# Patient Record
Sex: Female | Born: 1975 | Race: Black or African American | Hispanic: No | Marital: Single | Smoking: Current every day smoker
Health system: Southern US, Community
[De-identification: ages and names within clinical notes are randomized; demographics above are authoritative.]

## PROBLEM LIST (undated history)

## (undated) DIAGNOSIS — E119 Type 2 diabetes mellitus without complications: Secondary | ICD-10-CM

## (undated) DIAGNOSIS — N83209 Unspecified ovarian cyst, unspecified side: Secondary | ICD-10-CM

## (undated) DIAGNOSIS — M1990 Unspecified osteoarthritis, unspecified site: Secondary | ICD-10-CM

## (undated) DIAGNOSIS — E785 Hyperlipidemia, unspecified: Secondary | ICD-10-CM

## (undated) DIAGNOSIS — I10 Essential (primary) hypertension: Secondary | ICD-10-CM

## (undated) DIAGNOSIS — O139 Gestational [pregnancy-induced] hypertension without significant proteinuria, unspecified trimester: Secondary | ICD-10-CM

## (undated) HISTORY — PX: HERNIA REPAIR: SHX51

## (undated) HISTORY — DX: Type 2 diabetes mellitus without complications: E11.9

## (undated) HISTORY — DX: Hyperlipidemia, unspecified: E78.5

## (undated) HISTORY — PX: APPENDECTOMY: SHX54

---

## 1997-11-21 ENCOUNTER — Emergency Department (HOSPITAL_COMMUNITY): Admission: EM | Admit: 1997-11-21 | Discharge: 1997-11-21 | Payer: Self-pay | Admitting: Emergency Medicine

## 1997-11-27 ENCOUNTER — Emergency Department (HOSPITAL_COMMUNITY): Admission: EM | Admit: 1997-11-27 | Discharge: 1997-11-27 | Payer: Self-pay | Admitting: Emergency Medicine

## 1998-01-06 ENCOUNTER — Emergency Department (HOSPITAL_COMMUNITY): Admission: EM | Admit: 1998-01-06 | Discharge: 1998-01-06 | Payer: Self-pay | Admitting: Emergency Medicine

## 1998-01-17 ENCOUNTER — Emergency Department (HOSPITAL_COMMUNITY): Admission: EM | Admit: 1998-01-17 | Discharge: 1998-01-17 | Payer: Self-pay | Admitting: *Deleted

## 1998-02-16 ENCOUNTER — Inpatient Hospital Stay (HOSPITAL_COMMUNITY): Admission: AD | Admit: 1998-02-16 | Discharge: 1998-02-16 | Payer: Self-pay | Admitting: Obstetrics

## 1998-04-18 ENCOUNTER — Inpatient Hospital Stay (HOSPITAL_COMMUNITY): Admission: AD | Admit: 1998-04-18 | Discharge: 1998-04-18 | Payer: Self-pay | Admitting: Obstetrics & Gynecology

## 1998-04-27 ENCOUNTER — Other Ambulatory Visit: Admission: RE | Admit: 1998-04-27 | Discharge: 1998-04-27 | Payer: Self-pay | Admitting: Obstetrics

## 1998-05-02 ENCOUNTER — Ambulatory Visit (HOSPITAL_COMMUNITY): Admission: RE | Admit: 1998-05-02 | Discharge: 1998-05-02 | Payer: Self-pay | Admitting: Obstetrics

## 1998-06-17 ENCOUNTER — Inpatient Hospital Stay (HOSPITAL_COMMUNITY): Admission: AD | Admit: 1998-06-17 | Discharge: 1998-06-17 | Payer: Self-pay | Admitting: Obstetrics

## 1998-06-27 ENCOUNTER — Ambulatory Visit (HOSPITAL_COMMUNITY): Admission: RE | Admit: 1998-06-27 | Discharge: 1998-06-27 | Payer: Self-pay | Admitting: Obstetrics

## 1998-07-11 ENCOUNTER — Inpatient Hospital Stay (HOSPITAL_COMMUNITY): Admission: AD | Admit: 1998-07-11 | Discharge: 1998-07-11 | Payer: Self-pay | Admitting: Obstetrics

## 1998-08-02 ENCOUNTER — Emergency Department (HOSPITAL_COMMUNITY): Admission: EM | Admit: 1998-08-02 | Discharge: 1998-08-02 | Payer: Self-pay

## 1998-09-29 ENCOUNTER — Encounter (HOSPITAL_COMMUNITY): Admission: RE | Admit: 1998-09-29 | Discharge: 1998-10-04 | Payer: Self-pay | Admitting: Obstetrics

## 1998-10-03 ENCOUNTER — Inpatient Hospital Stay (HOSPITAL_COMMUNITY): Admission: AD | Admit: 1998-10-03 | Discharge: 1998-10-07 | Payer: Self-pay | Admitting: Obstetrics

## 1999-03-27 ENCOUNTER — Emergency Department (HOSPITAL_COMMUNITY): Admission: EM | Admit: 1999-03-27 | Discharge: 1999-03-27 | Payer: Self-pay | Admitting: Emergency Medicine

## 1999-04-18 ENCOUNTER — Emergency Department (HOSPITAL_COMMUNITY): Admission: EM | Admit: 1999-04-18 | Discharge: 1999-04-18 | Payer: Self-pay | Admitting: *Deleted

## 1999-05-10 ENCOUNTER — Emergency Department (HOSPITAL_COMMUNITY): Admission: EM | Admit: 1999-05-10 | Discharge: 1999-05-10 | Payer: Self-pay | Admitting: Emergency Medicine

## 1999-08-14 ENCOUNTER — Emergency Department (HOSPITAL_COMMUNITY): Admission: EM | Admit: 1999-08-14 | Discharge: 1999-08-14 | Payer: Self-pay | Admitting: *Deleted

## 2000-09-08 ENCOUNTER — Emergency Department (HOSPITAL_COMMUNITY): Admission: EM | Admit: 2000-09-08 | Discharge: 2000-09-08 | Payer: Self-pay | Admitting: Emergency Medicine

## 2000-09-08 ENCOUNTER — Encounter: Payer: Self-pay | Admitting: Emergency Medicine

## 2001-09-25 ENCOUNTER — Encounter: Payer: Self-pay | Admitting: Emergency Medicine

## 2001-09-25 ENCOUNTER — Emergency Department (HOSPITAL_COMMUNITY): Admission: EM | Admit: 2001-09-25 | Discharge: 2001-09-25 | Payer: Self-pay | Admitting: Emergency Medicine

## 2001-10-09 ENCOUNTER — Emergency Department (HOSPITAL_COMMUNITY): Admission: EM | Admit: 2001-10-09 | Discharge: 2001-10-09 | Payer: Self-pay

## 2001-10-15 ENCOUNTER — Emergency Department (HOSPITAL_COMMUNITY): Admission: EM | Admit: 2001-10-15 | Discharge: 2001-10-15 | Payer: Self-pay | Admitting: *Deleted

## 2001-11-12 ENCOUNTER — Emergency Department (HOSPITAL_COMMUNITY): Admission: EM | Admit: 2001-11-12 | Discharge: 2001-11-12 | Payer: Self-pay

## 2002-06-21 ENCOUNTER — Emergency Department (HOSPITAL_COMMUNITY): Admission: EM | Admit: 2002-06-21 | Discharge: 2002-06-21 | Payer: Self-pay | Admitting: Emergency Medicine

## 2002-09-09 ENCOUNTER — Emergency Department (HOSPITAL_COMMUNITY): Admission: EM | Admit: 2002-09-09 | Discharge: 2002-09-09 | Payer: Self-pay | Admitting: Emergency Medicine

## 2002-11-03 ENCOUNTER — Emergency Department (HOSPITAL_COMMUNITY): Admission: EM | Admit: 2002-11-03 | Discharge: 2002-11-03 | Payer: Self-pay | Admitting: Emergency Medicine

## 2002-12-30 ENCOUNTER — Encounter: Payer: Self-pay | Admitting: Emergency Medicine

## 2002-12-30 ENCOUNTER — Emergency Department (HOSPITAL_COMMUNITY): Admission: EM | Admit: 2002-12-30 | Discharge: 2002-12-30 | Payer: Self-pay | Admitting: Emergency Medicine

## 2005-09-16 ENCOUNTER — Emergency Department (HOSPITAL_COMMUNITY): Admission: EM | Admit: 2005-09-16 | Discharge: 2005-09-17 | Payer: Self-pay | Admitting: Emergency Medicine

## 2006-11-21 ENCOUNTER — Emergency Department (HOSPITAL_COMMUNITY): Admission: EM | Admit: 2006-11-21 | Discharge: 2006-11-21 | Payer: Self-pay | Admitting: Emergency Medicine

## 2007-07-14 ENCOUNTER — Inpatient Hospital Stay (HOSPITAL_COMMUNITY): Admission: AD | Admit: 2007-07-14 | Discharge: 2007-07-14 | Payer: Self-pay | Admitting: Obstetrics & Gynecology

## 2007-11-03 ENCOUNTER — Inpatient Hospital Stay (HOSPITAL_COMMUNITY): Admission: AD | Admit: 2007-11-03 | Discharge: 2007-11-03 | Payer: Self-pay | Admitting: Obstetrics

## 2007-11-13 ENCOUNTER — Inpatient Hospital Stay (HOSPITAL_COMMUNITY): Admission: AD | Admit: 2007-11-13 | Discharge: 2007-11-13 | Payer: Self-pay | Admitting: Obstetrics

## 2007-11-18 ENCOUNTER — Inpatient Hospital Stay (HOSPITAL_COMMUNITY): Admission: AD | Admit: 2007-11-18 | Discharge: 2007-11-28 | Payer: Self-pay | Admitting: Obstetrics

## 2007-11-21 ENCOUNTER — Encounter: Payer: Self-pay | Admitting: Obstetrics & Gynecology

## 2009-11-21 ENCOUNTER — Emergency Department (HOSPITAL_COMMUNITY): Admission: EM | Admit: 2009-11-21 | Discharge: 2009-11-21 | Payer: Self-pay | Admitting: Emergency Medicine

## 2009-11-28 ENCOUNTER — Encounter (HOSPITAL_COMMUNITY): Payer: Self-pay | Admitting: Emergency Medicine

## 2009-11-28 ENCOUNTER — Emergency Department (HOSPITAL_COMMUNITY): Admission: EM | Admit: 2009-11-28 | Discharge: 2009-11-28 | Payer: Self-pay | Admitting: Emergency Medicine

## 2009-11-28 ENCOUNTER — Ambulatory Visit: Payer: Self-pay | Admitting: Vascular Surgery

## 2009-12-21 ENCOUNTER — Emergency Department (HOSPITAL_COMMUNITY): Admission: EM | Admit: 2009-12-21 | Discharge: 2009-12-21 | Payer: Self-pay | Admitting: Emergency Medicine

## 2010-02-09 ENCOUNTER — Emergency Department (HOSPITAL_COMMUNITY): Admission: EM | Admit: 2010-02-09 | Discharge: 2010-02-09 | Payer: Self-pay | Admitting: Emergency Medicine

## 2010-06-03 ENCOUNTER — Emergency Department (HOSPITAL_COMMUNITY)
Admission: EM | Admit: 2010-06-03 | Discharge: 2010-06-03 | Payer: Self-pay | Source: Home / Self Care | Admitting: Emergency Medicine

## 2010-06-04 ENCOUNTER — Emergency Department (HOSPITAL_COMMUNITY): Admission: EM | Admit: 2010-06-04 | Discharge: 2010-06-04 | Payer: Self-pay | Admitting: Emergency Medicine

## 2010-07-27 ENCOUNTER — Emergency Department (HOSPITAL_COMMUNITY)
Admission: EM | Admit: 2010-07-27 | Discharge: 2010-07-27 | Payer: Self-pay | Source: Home / Self Care | Admitting: Emergency Medicine

## 2010-09-09 ENCOUNTER — Emergency Department (HOSPITAL_COMMUNITY)
Admission: EM | Admit: 2010-09-09 | Discharge: 2010-09-09 | Disposition: A | Discharge status: Home / Self Care | Payer: Medicaid Other | Attending: Emergency Medicine | Admitting: Emergency Medicine

## 2010-09-09 DIAGNOSIS — I10 Essential (primary) hypertension: Secondary | ICD-10-CM | POA: Insufficient documentation

## 2010-09-09 DIAGNOSIS — R1013 Epigastric pain: Secondary | ICD-10-CM | POA: Insufficient documentation

## 2010-09-09 DIAGNOSIS — K5289 Other specified noninfective gastroenteritis and colitis: Secondary | ICD-10-CM | POA: Insufficient documentation

## 2010-09-09 DIAGNOSIS — R000 Tachycardia, unspecified: Secondary | ICD-10-CM | POA: Insufficient documentation

## 2010-09-09 DIAGNOSIS — R112 Nausea with vomiting, unspecified: Secondary | ICD-10-CM | POA: Insufficient documentation

## 2010-09-09 DIAGNOSIS — R509 Fever, unspecified: Secondary | ICD-10-CM | POA: Insufficient documentation

## 2010-09-09 LAB — POCT PREGNANCY, URINE: Preg Test, Ur: NEGATIVE

## 2010-09-09 LAB — URINALYSIS, ROUTINE W REFLEX MICROSCOPIC
Ketones, ur: NEGATIVE mg/dL
Nitrite: NEGATIVE
Protein, ur: NEGATIVE mg/dL
Urobilinogen, UA: 1 mg/dL (ref 0.0–1.0)

## 2010-10-16 LAB — URINALYSIS, ROUTINE W REFLEX MICROSCOPIC
Bilirubin Urine: NEGATIVE
Ketones, ur: NEGATIVE mg/dL
Nitrite: NEGATIVE
Protein, ur: NEGATIVE mg/dL
pH: 6 (ref 5.0–8.0)

## 2010-11-05 ENCOUNTER — Emergency Department (HOSPITAL_COMMUNITY)
Admission: EM | Admit: 2010-11-05 | Discharge: 2010-11-05 | Disposition: A | Discharge status: Home / Self Care | Payer: Medicaid Other | Attending: Emergency Medicine | Admitting: Emergency Medicine

## 2010-11-05 DIAGNOSIS — R51 Headache: Secondary | ICD-10-CM | POA: Insufficient documentation

## 2010-11-05 DIAGNOSIS — R112 Nausea with vomiting, unspecified: Secondary | ICD-10-CM | POA: Insufficient documentation

## 2010-11-05 DIAGNOSIS — H53149 Visual discomfort, unspecified: Secondary | ICD-10-CM | POA: Insufficient documentation

## 2010-12-12 NOTE — Discharge Summary (Signed)
[name], [name]               ACCOUNT NO.:  [id number]   MEDICAL RECORD NO.:  [id number]          PATIENT TYPE:  INP   LOCATION:  9303                          FACILITY:  [facility]   PHYSICIAN:  Nicole [name], M.D.DATE OF BIRTH:  07-13-1976   DATE OF ADMISSION:  11/18/2007  DATE OF DISCHARGE:  11/28/2007                               DISCHARGE SUMMARY   The patient is a 35 year old gravida 1-0-0-1, had a C- section in the  past at term for preeclampsia and was seen today with a blood pressure  of 170/103 went down to 163/80, pulse 72, O2 sats 99%, platelets 126,  SGOT 56, SGPT 24, LDL 216, and uric acid 8.1.  She had a complaint of  epigastric pain and from the umbilicus going up.  She was 24 weeks'  pregnant, and she has had this problem along with nausea, vomiting for 2  days when she was at home trying to hold on.  Cervix long, closed.  The  pain was intermittent other than contracting.  She was pre-eclamptic and  started on magnesium 4 g loading 2 g per hour.  Ultrasound showed a  normal-appearing labor.  The aorta was normal size.  She had a Foley  catheter inserted and her urine output was greater than 200 mL/hour.  A  24-hour urine collection was begun and total protein was 21.34.  After  admission, she was given betamethasone 12.5 IM to be repeated in 24  hours, if the patient remained stable.  Her 24-hour urine protein was  21.34 mg for the day, creatinine 81.6.  Creatinine clearance 123.  The  liver enzymes were normal.  During the course of the night, she got  hydralazine x2 and her epigastric pain resolved.  She does a consult  from perinatology, and her labs were continued to be drawn q.8 h.  By  November 20, 2007, highest blood pressure was 160/85, output greater than  200 mL/hour, platelets on November 20, 2007, 140 up from 123, sodium 136,  potassium 4.3.  Uric acid 8.1, creatinine 0.88, SGOT 37, SGPT 35, all  normal, and she had 2+ edema.  By 5:30 a.m., on November 21, 2007, she  complained of epigastric pain, nausea, vomiting.  The blood pressures  were  in the 160s over 90s.  Fetal heart normal and her liver enzymes  tripled, platelets fell, and she a had HELLP syndrome and it was decided  she be delivered.  She underwent a low cesarean section, and she had a 1-  pound 1-ounce female.  Postop day 1, she was stable and her magnesium was  discontinued on day 2.  By day 3 postop, blood pressure 150/90,  hemoglobin 9.4, and platelets 115.  SGPT 173,  LDL 345, magnesium have  been stopped and she was on Norvasc 10 mg daily and clonidine 0.1 b.i.d.  On day 4,  the clonidine was discontinued and started on labetalol 200  p.o. q.8 h., hydrochlorothiazide 50 daily, Norvasc 10 mg daily.  By day  5, her blood pressures were all normal and temperature was 102.  She was  started on ampicillin and gentamicin and blood pressures, leg 120/70.  On day 5, the hydrochlorothiazide was discontinued and labetalol  decreased to b.i.d.  Her edema became less.  On day 6, highest  temperature was 103 at 6 p.m. On day 5, no complaints.  She had Cleocin  900 IV added to her regimen, and by the evening of day 6, she was  afebrile for greater than 24 hours, felt fine, and was discharged home  on Cleocin 300 p.o. q.6 h.,  labetalol 200 mg p.o. b.i.d., Norvasc 10 mg  p.o. daily, Tylox for pain and to see me in 1 week for blood pressure  check.   DISCHARGE DIAGNOSIS:  Severe preeclampsia with hemolysis, elevated liver  enzymes, and low platelet syndrome and postpartum endometritis.           ______________________________  Nicole [name], M.D.     Nicole [name]  D:  11/27/2007  T:  11/28/2007  Job:  [id number]

## 2010-12-12 NOTE — Op Note (Signed)
Nicole [name], Nicole [name]               ACCOUNT NO.:  [id number]   MEDICAL RECORD NO.:  [id number]          PATIENT TYPE:  INP   LOCATION:  9158                          FACILITY:  [facility]   PHYSICIAN:  Roseanna Rainbow, M.D.DATE OF BIRTH:  1976-01-28   DATE OF PROCEDURE:  11/21/2007  DATE OF DISCHARGE:                               OPERATIVE REPORT   PREOPERATIVE DIAGNOSES:  1. Intrauterine pregnancy at 25+ weeks.  2. Partial hemolysis, elevated liver enzymes, low platelets syndrome.  3. Persistent epigastric pain.   POSTOPERATIVE DIAGNOSES:  1. Intrauterine pregnancy at 25+ weeks.  2. Partial hemolysis, elevated liver enzymes, low platelets syndrome.  3. Persistent epigastric pain.   PROCEDURE:  Repeat classical cesarean delivery.   SURGEON:  Dr. Tamela Oddi.   ANESTHESIA:  Spinal.   PATHOLOGY:  Placenta.   ESTIMATED BLOOD LOSS:  600 mL.   COMPLICATIONS:  None.   DESCRIPTION OF PROCEDURE:  The patient was taken to the operating room  with an IV running.  A spinal anesthetic was then placed.  The patient  was placed in the dorsal supine position with a left lateral tilt and  prepped and draped in the usual sterile fashion.  After a time-out had  been completed, a Pfannenstiel skin incision was then made with the  scalpel through the previous scar and carried down to the underlying  fascia.  The fascia was nicked in the midline.  The fascial incision was  then extended bilaterally with curved Mayo scissors.  The superior  aspect of the fascial incision was tented up and the underlying rectus  muscles dissected off.  The inferior aspect of the fascial incision was  manipulated in a similar fashion.  The rectus muscles were scarred in  the midline and the scarring involved the underlying parietal peritoneum  of the anterior abdominal wall.  The rectus muscles were then separated  in the midline using sharp and blunt dissection.  The parietal  peritoneum was tented up and  entered sharply.  This incision was  extended superiorly and inferiorly with good visualization of the  bladder.  An Alexis retractor was then placed into the incision.  The  vesicouterine peritoneum was tented up and entered sharply.  This  incision was then extended bilaterally and a bladder flap created  bluntly.  A midline vertical uterine incision was then made with the  scalpel starting in the lower uterine segment and extending to the  fundal region.  The infant and sac and placenta were delivered in caul.  The cord was clamped and cut.  The infant was handed off to the awaiting  neonatologist.  An umbilical artery pH was sent.  The uterine incision  was then reapproximated in layers using running interlocking sutures of  0 Monocryl.  The paracolic gutters were then irrigated.  Seprafilm was  placed over the uterine incision sutures.  The parietal peritoneum was  reapproximated in a running fashion using 2-0 Vicryl.  This fascia was  closed  in a running fashion using 0 PDS.  The skin was closed with staples.  At  the close of  the procedure, the instrument and pack counts were said to  be correct x2.  The patient had been given 2 gm of cephazolin.  The  patient was taken to the PACU, awake and in stable condition.      Roseanna Rainbow, M.D.  Electronically Signed     LAJ/MEDQ  D:  11/21/2007  T:  11/21/2007  Job:  [id number]   cc:   Kathreen Cosier, M.D.  Fax: [phone]

## 2011-02-20 ENCOUNTER — Emergency Department (HOSPITAL_COMMUNITY)
Admission: EM | Admit: 2011-02-20 | Discharge: 2011-02-20 | Disposition: A | Discharge status: Home / Self Care | Payer: Medicaid Other | Attending: Emergency Medicine | Admitting: Emergency Medicine

## 2011-02-20 DIAGNOSIS — S0990XA Unspecified injury of head, initial encounter: Secondary | ICD-10-CM | POA: Insufficient documentation

## 2011-02-20 DIAGNOSIS — R51 Headache: Secondary | ICD-10-CM | POA: Insufficient documentation

## 2011-02-20 DIAGNOSIS — I10 Essential (primary) hypertension: Secondary | ICD-10-CM | POA: Insufficient documentation

## 2011-02-20 DIAGNOSIS — IMO0002 Reserved for concepts with insufficient information to code with codable children: Secondary | ICD-10-CM | POA: Insufficient documentation

## 2011-04-24 LAB — COMPREHENSIVE METABOLIC PANEL
ALT: 102 — ABNORMAL HIGH
ALT: 278 — ABNORMAL HIGH
ALT: 306 — ABNORMAL HIGH
ALT: 39 — ABNORMAL HIGH
ALT: 40 — ABNORMAL HIGH
ALT: 44 — ABNORMAL HIGH
ALT: 446 — ABNORMAL HIGH
ALT: 600 — ABNORMAL HIGH
AST: 125 — ABNORMAL HIGH
AST: 37
AST: 42 — ABNORMAL HIGH
AST: 458 — ABNORMAL HIGH
AST: 56 — ABNORMAL HIGH
AST: 63 — ABNORMAL HIGH
AST: 631 — ABNORMAL HIGH
AST: 88 — ABNORMAL HIGH
Albumin: 2.2 — ABNORMAL LOW
Albumin: 2.2 — ABNORMAL LOW
Albumin: 2.3 — ABNORMAL LOW
Albumin: 2.4 — ABNORMAL LOW
Albumin: 2.5 — ABNORMAL LOW
Albumin: 2.5 — ABNORMAL LOW
Albumin: 2.6 — ABNORMAL LOW
Albumin: 2.7 — ABNORMAL LOW
Albumin: 2.7 — ABNORMAL LOW
Alkaline Phosphatase: 60
Alkaline Phosphatase: 60
Alkaline Phosphatase: 62
Alkaline Phosphatase: 62
Alkaline Phosphatase: 63
Alkaline Phosphatase: 79
Alkaline Phosphatase: 80
BUN: 10
BUN: 12
BUN: 13
BUN: 6
BUN: 6
BUN: 6
BUN: 7
CO2: 22
CO2: 24
CO2: 24
CO2: 25
CO2: 25
CO2: 25
CO2: 26
CO2: 27
CO2: 28
CO2: 30
Calcium: 7 — ABNORMAL LOW
Calcium: 7.2 — ABNORMAL LOW
Calcium: 7.2 — ABNORMAL LOW
Calcium: 7.4 — ABNORMAL LOW
Calcium: 8.1 — ABNORMAL LOW
Calcium: 8.7
Chloride: 101
Chloride: 102
Chloride: 103
Chloride: 103
Chloride: 103
Chloride: 103
Chloride: 104
Chloride: 105
Chloride: 105
Creatinine, Ser: 0.8
Creatinine, Ser: 0.81
Creatinine, Ser: 0.87
Creatinine, Ser: 0.88
Creatinine, Ser: 0.97
Creatinine, Ser: 1.02
GFR calc Af Amer: >60
GFR calc Af Amer: >60
GFR calc Af Amer: >60
GFR calc Af Amer: >60
GFR calc Af Amer: >60
GFR calc Af Amer: >60
GFR calc non Af Amer: >60
GFR calc non Af Amer: >60
GFR calc non Af Amer: >60
GFR calc non Af Amer: >60
GFR calc non Af Amer: >60
GFR calc non Af Amer: >60
GFR calc non Af Amer: >60
GFR calc non Af Amer: >60
GFR calc non Af Amer: >60
GFR calc non Af Amer: >60
GFR calc non Af Amer: >60
Glucose, Bld: 104 — ABNORMAL HIGH
Glucose, Bld: 114 — ABNORMAL HIGH
Glucose, Bld: 134 — ABNORMAL HIGH
Glucose, Bld: 139 — ABNORMAL HIGH
Glucose, Bld: 96
Potassium: 3.9
Potassium: 4
Potassium: 4.1
Potassium: 4.1
Potassium: 4.2
Potassium: 4.3
Potassium: 4.4
Potassium: 4.5
Sodium: 132 — ABNORMAL LOW
Sodium: 133 — ABNORMAL LOW
Sodium: 133 — ABNORMAL LOW
Sodium: 134 — ABNORMAL LOW
Sodium: 134 — ABNORMAL LOW
Sodium: 134 — ABNORMAL LOW
Sodium: 136
Sodium: 137
Total Bilirubin: 0.3
Total Bilirubin: 0.4
Total Bilirubin: 0.5
Total Bilirubin: 0.6
Total Bilirubin: 0.8
Total Bilirubin: 0.9
Total Bilirubin: 1
Total Bilirubin: 1.6 — ABNORMAL HIGH
Total Protein: 5.1 — ABNORMAL LOW
Total Protein: 5.5 — ABNORMAL LOW
Total Protein: 5.5 — ABNORMAL LOW
Total Protein: 5.7 — ABNORMAL LOW
Total Protein: 5.8 — ABNORMAL LOW
Total Protein: 6.2

## 2011-04-24 LAB — CBC
HCT: 27.7 — ABNORMAL LOW
HCT: 33.6 — ABNORMAL LOW
HCT: 35.4 — ABNORMAL LOW
HCT: 36.6
HCT: 37.5
HCT: 39.8
Hemoglobin: 10.1 — ABNORMAL LOW
Hemoglobin: 10.7 — ABNORMAL LOW
Hemoglobin: 11.5 — ABNORMAL LOW
Hemoglobin: 12
Hemoglobin: 12.7
Hemoglobin: 12.8
MCHC: 33.6
MCHC: 34
MCHC: 34.1
MCHC: 34.3
MCHC: 34.4
MCHC: 34.5
MCHC: 34.6
MCV: 83.4
MCV: 83.5
MCV: 83.7
MCV: 83.7
MCV: 84.4
MCV: 84.7
MCV: 84.7
MCV: 84.8
Platelets: 115 — ABNORMAL LOW
Platelets: 126 — ABNORMAL LOW
Platelets: 135 — ABNORMAL LOW
Platelets: 140 — ABNORMAL LOW
Platelets: 166
Platelets: 169
Platelets: 46 — CL
Platelets: 53 — ABNORMAL LOW
Platelets: 97 — ABNORMAL LOW
RBC: 3.46 — ABNORMAL LOW
RBC: 3.68 — ABNORMAL LOW
RBC: 3.98
RBC: 4.14
RBC: 4.38
RBC: 4.43
RBC: 4.48
RDW: 14.7
RDW: 15
RDW: 15
RDW: 15.1
RDW: 15.3
WBC: 11.2 — ABNORMAL HIGH
WBC: 11.3 — ABNORMAL HIGH
WBC: 13.8 — ABNORMAL HIGH
WBC: 14.5 — ABNORMAL HIGH
WBC: 15 — ABNORMAL HIGH
WBC: 16.8 — ABNORMAL HIGH
WBC: 23 — ABNORMAL HIGH

## 2011-04-24 LAB — CULTURE, BLOOD (ROUTINE X 2)
Culture: NO GROWTH
Culture: NO GROWTH

## 2011-04-24 LAB — DIC (DISSEMINATED INTRAVASCULAR COAGULATION)PANEL
D-Dimer, Quant: 3.18 — ABNORMAL HIGH
Fibrinogen: 415
INR: 0.9
Platelets: 106 — ABNORMAL LOW
Prothrombin Time: 12.4

## 2011-04-24 LAB — LACTATE DEHYDROGENASE
LDH: 145
LDH: 190
LDH: 199
LDH: 209
LDH: 211
LDH: 611 — ABNORMAL HIGH

## 2011-04-24 LAB — URINALYSIS, ROUTINE W REFLEX MICROSCOPIC
Bilirubin Urine: NEGATIVE
Ketones, ur: 40 — AB
Nitrite: NEGATIVE
Specific Gravity, Urine: 1.025
Specific Gravity, Urine: 1.025
Urobilinogen, UA: 0.2
pH: 6

## 2011-04-24 LAB — URIC ACID
Uric Acid, Serum: 7.5 — ABNORMAL HIGH
Uric Acid, Serum: 8.3 — ABNORMAL HIGH
Uric Acid, Serum: 8.4 — ABNORMAL HIGH
Uric Acid, Serum: 8.7 — ABNORMAL HIGH
Uric Acid, Serum: 9.1 — ABNORMAL HIGH

## 2011-04-24 LAB — CREATININE CLEARANCE, URINE, 24 HOUR
Creatinine, 24H Ur: 1795
Creatinine: 1.01

## 2011-04-24 LAB — MAGNESIUM
Magnesium: 3.1 — ABNORMAL HIGH
Magnesium: 5.9 — ABNORMAL HIGH

## 2011-04-24 LAB — DIFFERENTIAL
Basophils Relative: 0
Eosinophils Absolute: 0.1
Eosinophils Relative: 0
Lymphocytes Relative: 8 — ABNORMAL LOW
Lymphs Abs: 1.1
Neutro Abs: 12.4 — ABNORMAL HIGH
Neutrophils Relative %: 75

## 2011-04-24 LAB — HEPATITIS B SURFACE ANTIGEN: Hepatitis B Surface Ag: NEGATIVE

## 2011-04-24 LAB — SICKLE CELL SCREEN: Sickle Cell Screen: NEGATIVE

## 2011-04-24 LAB — URINE MICROSCOPIC-ADD ON

## 2011-04-24 LAB — TYPE AND SCREEN: ABO/RH(D): O POS

## 2011-04-24 LAB — PROTEIN, URINE, 24 HOUR: Collection Interval-UPROT: 24

## 2011-04-24 LAB — RPR: RPR Ser Ql: NONREACTIVE

## 2011-04-24 LAB — ABO/RH: ABO/RH(D): O POS

## 2011-04-24 LAB — RAPID HIV SCREEN (WH-MAU): Rapid HIV Screen: NONREACTIVE

## 2011-04-24 LAB — AMYLASE: Amylase: 43

## 2011-04-24 LAB — SAMPLE TO BLOOD BANK

## 2011-05-04 LAB — CBC
HCT: 38.1
MCHC: 33.6
Platelets: 221
RDW: 13.8

## 2011-05-04 LAB — URINALYSIS, ROUTINE W REFLEX MICROSCOPIC
Bilirubin Urine: NEGATIVE
Glucose, UA: NEGATIVE
Ketones, ur: NEGATIVE
Specific Gravity, Urine: 1.025
pH: 5.5

## 2011-05-04 LAB — URINE MICROSCOPIC-ADD ON

## 2011-05-04 LAB — GC/CHLAMYDIA PROBE AMP, GENITAL: Chlamydia, DNA Probe: NEGATIVE

## 2011-05-04 LAB — POCT PREGNANCY, URINE: Preg Test, Ur: POSITIVE

## 2011-05-04 LAB — WET PREP, GENITAL: Trich, Wet Prep: NONE SEEN

## 2011-07-18 ENCOUNTER — Encounter: Payer: Self-pay | Admitting: *Deleted

## 2011-07-18 ENCOUNTER — Emergency Department (HOSPITAL_BASED_OUTPATIENT_CLINIC_OR_DEPARTMENT_OTHER)
Admission: EM | Admit: 2011-07-18 | Discharge: 2011-07-18 | Disposition: A | Discharge status: Home / Self Care | Payer: Self-pay | Attending: Emergency Medicine | Admitting: Emergency Medicine

## 2011-07-18 DIAGNOSIS — J209 Acute bronchitis, unspecified: Secondary | ICD-10-CM | POA: Insufficient documentation

## 2011-07-18 DIAGNOSIS — F17200 Nicotine dependence, unspecified, uncomplicated: Secondary | ICD-10-CM | POA: Insufficient documentation

## 2011-07-18 DIAGNOSIS — I10 Essential (primary) hypertension: Secondary | ICD-10-CM | POA: Insufficient documentation

## 2011-07-18 HISTORY — DX: Essential (primary) hypertension: I10

## 2011-07-18 MED ORDER — MORPHINE SULFATE 10 MG/ML IJ SOLN: 10.0000 mg | Freq: Once | INTRAMUSCULAR | Status: DC

## 2011-07-18 MED ORDER — ACETAMINOPHEN 325 MG PO TABS
650.0000 mg | ORAL_TABLET | Freq: Once | ORAL | Status: AC
  Administered 2011-07-18: 650 mg via ORAL
  Filled 2011-07-18 (×2): qty 2

## 2011-07-18 MED ORDER — DOXYCYCLINE HYCLATE 100 MG PO CAPS: 100.0000 mg | ORAL_CAPSULE | Freq: Two times a day (BID) | ORAL | Status: AC

## 2011-07-18 MED ORDER — ONDANSETRON HCL 4 MG/2ML IJ SOLN: 4.0000 mg | Freq: Once | INTRAMUSCULAR | Status: DC

## 2011-07-18 MED ORDER — HYDROCOD POLST-CHLORPHEN POLST 10-8 MG/5ML PO LQCR: 10.0000 mL | Freq: Two times a day (BID) | ORAL | Status: DC | PRN

## 2011-07-18 MED ORDER — AZITHROMYCIN 250 MG PO TABS: ORAL_TABLET | ORAL | Status: DC

## 2011-07-18 NOTE — ED Notes (Signed)
Pt brought in by EMS for flu like symptoms x 1 week

## 2011-07-18 NOTE — ED Provider Notes (Signed)
History     CSN: [id number] Arrival date & time: 07/18/2011  2:28 PM   First MD Initiated Contact with Patient 07/18/11 1452      Chief Complaint  Patient presents with  . Influenza    (Consider location/radiation/quality/duration/timing/severity/associated sxs/prior treatment) HPI Comments: The patient is a 35 her old woman who got sick last Sunday, 3 days ago. She's had earache and sore throat. She is coughing productively. She has posttussive emesis. She feels weak. She does not she's had a fever or not because she has measured her temperature. She took some Tamiflu that a friend had without relief. She smokes, approximately 6 cigarettes per day, but has not smoked with this illness.  Patient is a 35 y.o. female presenting with URI. The history is provided by the patient and medical records. No language interpreter was used.  URI The primary symptoms include fever, headaches, ear pain, sore throat, cough, nausea, vomiting and myalgias. The current episode started 3 to 5 days ago. The problem has been gradually worsening.  Episode onset: She has had a subjective sensation of fever but did not measure her temperature.  The headache began more than 2 days ago. The headache developed gradually. The pain from the headache is at a severity of 10/10.  The cough began 3 to 5 days ago. The cough is paroxysmal. There is nondescript sputum produced.  The vomiting began more than 2 days ago. Frequency: She has post-tussive emesis.  Risk factors: Her children have been sick with upper respiratory infections.    Past Medical History  Diagnosis Date  . Hypertension     Past Surgical History  Procedure Date  . Cesarean section     History reviewed. No pertinent family history.  History  Substance Use Topics  . Smoking status: Current Everyday Smoker -- 0.5 packs/day  . Smokeless tobacco: Not on file  . Alcohol Use: No    OB History    Grav Para Term Preterm Abortions TAB SAB Ect  Mult Living                  Review of Systems  Constitutional: Positive for fever.  HENT: Positive for ear pain and sore throat.   Eyes: Negative.   Respiratory: Positive for cough.   Gastrointestinal: Positive for nausea and vomiting. Negative for diarrhea.  Genitourinary: Negative.   Musculoskeletal: Positive for myalgias.  Neurological: Positive for headaches.  Psychiatric/Behavioral: Negative.     Allergies  Review of patient's allergies indicates no known allergies.  Home Medications  No current outpatient prescriptions on file.  BP 135/73  Pulse 63  Temp(Src) 99.4 F (37.4 C) (Oral)  Resp 16  Ht 5\' 8"  (1.727 m)  Wt 275 lb (124.739 kg)  BMI 41.81 kg/m2  SpO2 100%  LMP 06/30/2011  Physical Exam  Nursing note and vitals reviewed. Constitutional: She is oriented to person, place, and time. She appears well-developed and well-nourished. Distressed: in mild to moderate distress, complaining of headache, earache sore throat and cough, and posttussive emesis.  HENT:  Head: Normocephalic and atraumatic.  Right Ear: External ear normal.  Left Ear: External ear normal.  Mouth/Throat: Oropharynx is clear and moist.  Eyes: Conjunctivae and EOM are normal. Pupils are equal, round, and reactive to light. Left eye exhibits no discharge. No scleral icterus.  Neck: Normal range of motion. Neck supple.  Cardiovascular: Normal rate and regular rhythm.   Pulmonary/Chest: Effort normal and breath sounds normal.  Abdominal: Soft. Bowel sounds are normal.  Musculoskeletal: Normal range of motion.  Lymphadenopathy:    She has no cervical adenopathy.  Neurological: She is alert and oriented to person, place, and time.       No sensory or motor deficit.  There is no bony deformity of her head or neck. Her neck is supple with full range of motion.  Skin: Skin is warm and dry.  Psychiatric: She has a normal mood and affect. Her behavior is normal.    ED Course  Procedures  (including critical care time)   1. Acute bronchitis      Disp:  Rx with Z-Pak, Tussionex.  Instructions for bronchitis, hazards of smoking given.     Carleene Cooper III, MD 07/18/11 [phone]

## 2011-10-07 ENCOUNTER — Encounter (HOSPITAL_COMMUNITY): Payer: Self-pay | Admitting: *Deleted

## 2011-10-07 ENCOUNTER — Emergency Department (HOSPITAL_COMMUNITY)
Admission: EM | Admit: 2011-10-07 | Discharge: 2011-10-08 | Disposition: A | Discharge status: Home / Self Care | Payer: Self-pay | Attending: Emergency Medicine | Admitting: Emergency Medicine

## 2011-10-07 DIAGNOSIS — F17200 Nicotine dependence, unspecified, uncomplicated: Secondary | ICD-10-CM | POA: Insufficient documentation

## 2011-10-07 DIAGNOSIS — J029 Acute pharyngitis, unspecified: Secondary | ICD-10-CM | POA: Insufficient documentation

## 2011-10-07 DIAGNOSIS — I10 Essential (primary) hypertension: Secondary | ICD-10-CM | POA: Insufficient documentation

## 2011-10-07 NOTE — ED Notes (Signed)
Pt presents with c/o "burning" when she breathes and dizziness.  Pt states that this has been ongoing x 3 days.  Pt is noted to scream terribly when the dynamap takes her blood pressure.

## 2011-10-08 LAB — RAPID STREP SCREEN (MED CTR MEBANE ONLY): Streptococcus, Group A Screen (Direct): NEGATIVE

## 2011-10-08 MED ORDER — HYDROCODONE-ACETAMINOPHEN 7.5-325 MG/15ML PO SOLN: 15.0000 mL | Freq: Three times a day (TID) | ORAL | Status: AC | PRN

## 2011-10-08 NOTE — ED Provider Notes (Signed)
History     CSN: [id number]  Arrival date & time 10/07/11  2219   First MD Initiated Contact with Patient 10/08/11 0000      Chief Complaint  Patient presents with  . Sore Throat  . Dizziness    (Consider location/radiation/quality/duration/timing/severity/associated sxs/prior treatment) HPI Comments: 36 year old female with a history of hypertension who presents with complaint of sore throat. This is been going on for approximately 3 days, has been gradually getting worse and is noted to get worse when she swallows or breathes. She denies any swelling in her mouth or in her throat and has no fevers, vomiting chills rashes diarrhea neck pain chest pain coughing stuffy nose or headache. She has no known sick contacts. Currently her symptoms are moderate  Patient is a 36 y.o. female presenting with pharyngitis. The history is provided by the patient and a relative.  Sore Throat Pertinent negatives include no headaches.    Past Medical History  Diagnosis Date  . Hypertension     Past Surgical History  Procedure Date  . Cesarean section     No family history on file.  History  Substance Use Topics  . Smoking status: Current Everyday Smoker -- 0.5 packs/day  . Smokeless tobacco: Not on file  . Alcohol Use: No    OB History    Grav Para Term Preterm Abortions TAB SAB Ect Mult Living                  Review of Systems  Constitutional: Negative for fever and chills.  HENT: Positive for sore throat. Negative for ear pain, congestion and rhinorrhea.   Eyes: Negative for discharge and redness.  Respiratory: Negative for cough.   Gastrointestinal: Negative for nausea, vomiting and diarrhea.  Genitourinary: Negative for dysuria.  Musculoskeletal: Negative for back pain and joint swelling.  Skin: Negative for rash.  Neurological: Negative for headaches.  Hematological: Negative for adenopathy.    Allergies  Review of patient's allergies indicates no known  allergies.  Home Medications  No current outpatient prescriptions on file.  BP 153/76  Pulse 83  Temp(Src) 98.1 F (36.7 C) (Oral)  Resp 19  SpO2 100%  LMP 10/07/2011  Physical Exam  Nursing note and vitals reviewed. Constitutional: She appears well-developed and well-nourished. No distress.  HENT:  Head: Normocephalic and atraumatic.  Mouth/Throat: Oropharynx is clear and moist. No oropharyngeal exudate.       Tympanic membranes normal bilaterally, oropharynx is clear and moist, no signs of asymmetry, hypertrophy, exudate or erythema  Eyes: Conjunctivae and EOM are normal. Pupils are equal, round, and reactive to light. Right eye exhibits no discharge. Left eye exhibits no discharge. No scleral icterus.  Neck: Normal range of motion. Neck supple. No JVD present. No thyromegaly present.  Cardiovascular: Normal rate, regular rhythm, normal heart sounds and intact distal pulses.  Exam reveals no gallop and no friction rub.   No murmur heard. Pulmonary/Chest: Effort normal and breath sounds normal. No respiratory distress. She has no wheezes. She has no rales.  Abdominal: Soft. Bowel sounds are normal. She exhibits no distension and no mass. There is no tenderness.       No hepatosplenomegaly  Musculoskeletal: Normal range of motion. She exhibits no edema and no tenderness.  Lymphadenopathy:    She has no cervical adenopathy.  Neurological: She is alert. Coordination normal.  Skin: Skin is warm and dry. No rash noted. No erythema.  Psychiatric: She has a normal mood and affect. Her behavior is  normal.    ED Course  Procedures (including critical care time)   Labs Reviewed  RAPID STREP SCREEN   No results found.   No diagnosis found.    MDM  Patient has a benign appearance with normal oxygen saturations, normal respirations pulse and temperature. Rapid Strep test sent, anticipate discharge home with symptomatic care       Vida Roller, MD 10/09/11 [phone]

## 2011-10-08 NOTE — Discharge Instructions (Signed)

## 2012-02-14 ENCOUNTER — Emergency Department (HOSPITAL_COMMUNITY): Payer: Self-pay

## 2012-02-14 ENCOUNTER — Encounter (HOSPITAL_COMMUNITY): Payer: Self-pay | Admitting: Emergency Medicine

## 2012-02-14 ENCOUNTER — Emergency Department (HOSPITAL_COMMUNITY)
Admission: EM | Admit: 2012-02-14 | Discharge: 2012-02-15 | Discharge status: Against Medical Advice | Payer: Self-pay | Attending: Emergency Medicine | Admitting: Emergency Medicine

## 2012-02-14 DIAGNOSIS — R079 Chest pain, unspecified: Secondary | ICD-10-CM | POA: Insufficient documentation

## 2012-02-14 DIAGNOSIS — I10 Essential (primary) hypertension: Secondary | ICD-10-CM | POA: Insufficient documentation

## 2012-02-14 DIAGNOSIS — F17200 Nicotine dependence, unspecified, uncomplicated: Secondary | ICD-10-CM | POA: Insufficient documentation

## 2012-02-14 DIAGNOSIS — R0602 Shortness of breath: Secondary | ICD-10-CM | POA: Insufficient documentation

## 2012-02-14 MED ORDER — ONDANSETRON HCL 4 MG/2ML IJ SOLN
4.0000 mg | Freq: Once | INTRAMUSCULAR | Status: AC
  Administered 2012-02-14: 4 mg via INTRAVENOUS
  Filled 2012-02-14: qty 2

## 2012-02-14 MED ORDER — ASPIRIN 81 MG PO CHEW: 324.0000 mg | CHEWABLE_TABLET | Freq: Once | ORAL | Status: DC

## 2012-02-14 MED ORDER — MORPHINE SULFATE 4 MG/ML IJ SOLN
4.0000 mg | Freq: Once | INTRAMUSCULAR | Status: AC
  Administered 2012-02-14: 4 mg via INTRAVENOUS
  Filled 2012-02-14: qty 1

## 2012-02-14 MED ORDER — NITROGLYCERIN 0.4 MG SL SUBL
0.4000 mg | SUBLINGUAL_TABLET | SUBLINGUAL | Status: DC | PRN
  Administered 2012-02-15: 0.4 mg via SUBLINGUAL
  Filled 2012-02-14: qty 25

## 2012-02-14 NOTE — ED Notes (Signed)
Chest pain when in EMS truck; SOB all day worsening when sitting up according to pt. Chest pain upon inhalation; sharp pain; non radiating; center of chest. Aspirin given by EMS; refused nitro and IV. Hx of gestational hypertension. Normal EKG per EMS.

## 2012-02-14 NOTE — ED Provider Notes (Signed)
History     CSN: [id number]  Arrival date & time 02/14/12  2327   First MD Initiated Contact with Patient 02/14/12 2328      Chief Complaint  Patient presents with  . Chest Pain    (Consider location/radiation/quality/duration/timing/severity/associated sxs/prior treatment) HPI Provided by patient. Called EMS for chest pain and trouble breathing. Hurts to take a deep breath. Pain sharp in quality. Location across her chest and substernal. No radiation of pain. Pain is 8 of 10 in severity. Onset earlier today and worsening throughout the day.  No history of same. Is a smoker.  previously prescribed medications for hypertension but has not taken them in years. She denies any medical problems including heart or lung problems. Denies any family history of early heart disease. No leg pain or leg swelling. No recent surgery travel. No history of DVT or PE. No known alleviating factors. No rash, cough or recent illness. Past Medical History  Diagnosis Date  . Hypertension     Past Surgical History  Procedure Date  . Cesarean section     No family history on file.  History  Substance Use Topics  . Smoking status: Current Everyday Smoker -- 0.5 packs/day  . Smokeless tobacco: Not on file  . Alcohol Use: No    OB History    Grav Para Term Preterm Abortions TAB SAB Ect Mult Living                  Review of Systems  Constitutional: Negative for fever and chills.  HENT: Negative for neck pain and neck stiffness.   Eyes: Negative for pain.  Respiratory: Positive for shortness of breath.   Cardiovascular: Positive for chest pain.  Gastrointestinal: Negative for abdominal pain.  Genitourinary: Negative for dysuria.  Musculoskeletal: Negative for back pain.  Skin: Negative for rash.  Neurological: Negative for headaches.  All other systems reviewed and are negative.    Allergies  Review of patient's allergies indicates no known allergies.  Home Medications  No current  outpatient prescriptions on file.  BP 119/49  Pulse 69  Temp 98.7 F (37.1 C) (Oral)  Resp 18  SpO2 98%  Physical Exam  Constitutional: She is oriented to person, place, and time. She appears well-developed and well-nourished.  HENT:  Head: Normocephalic and atraumatic.  Eyes: Conjunctivae and EOM are normal. Pupils are equal, round, and reactive to light.  Neck: Trachea normal. Neck supple. No thyromegaly present.  Cardiovascular: Normal rate, regular rhythm, S1 normal, S2 normal and normal pulses.     No systolic murmur is present   No diastolic murmur is present  Pulses:      Radial pulses are 2+ on the right side, and 2+ on the left side.  Pulmonary/Chest: Effort normal and breath sounds normal. She has no wheezes. She has no rhonchi. She has no rales.       chest wall tenderness without rash or crepitus   Abdominal: Soft. Normal appearance and bowel sounds are normal. There is no tenderness. There is no CVA tenderness and negative Murphy's sign.  Musculoskeletal:       BLE:s Calves nontender, no cords or erythema, negative Homans sign  Neurological: She is alert and oriented to person, place, and time. She has normal strength. No cranial nerve deficit or sensory deficit. GCS eye subscore is 4. GCS verbal subscore is 5. GCS motor subscore is 6.  Skin: Skin is warm and dry. No rash noted. She is not diaphoretic.  Psychiatric: Her  speech is normal.       Cooperative and appropriate    ED Course  Procedures (including critical care time)  Results for orders placed during the hospital encounter of 02/14/12  D-DIMER, QUANTITATIVE      Component Value Range   D-Dimer, Quant 0.92 (*) 0.00 - 0.48 ug/mL-FEU  CBC      Component Value Range   WBC 10.6 (*) 4.0 - 10.5 K/uL   RBC 4.49  3.87 - 5.11 MIL/uL   Hemoglobin 12.2  12.0 - 15.0 g/dL   HCT 16.1  09.6 - 04.5 %   MCV 81.1  78.0 - 100.0 fL   MCH 27.2  26.0 - 34.0 pg   MCHC 33.5  30.0 - 36.0 g/dL   RDW 40.9  81.1 - 91.4 %    Platelets 214  150 - 400 K/uL  POCT I-STAT, CHEM 8      Component Value Range   Sodium 140  135 - 145 mEq/L   Potassium 3.8  3.5 - 5.1 mEq/L   Chloride 106  96 - 112 mEq/L   BUN 11  6 - 23 mg/dL   Creatinine, Ser 7.82  0.50 - 1.10 mg/dL   Glucose, Bld 95  70 - 99 mg/dL   Calcium, Ion 9.56  2.13 - 1.23 mmol/L   TCO2 21  0 - 100 mmol/L   Hemoglobin 12.6  12.0 - 15.0 g/dL   HCT 08.6  57.8 - 46.9 %  POCT I-STAT TROPONIN I      Component Value Range   Troponin i, poc 0.00  0.00 - 0.08 ng/mL   Comment 3            Dg Chest Portable 1 View  02/15/2012  *RADIOLOGY REPORT*  Clinical Data: Chest pain and shortness of breath.  PORTABLE CHEST - 1 VIEW  Comparison: 12/21/2009  Findings: Mild cardiac enlargement with normal pulmonary vascularity.  Shallow inspiration.  No focal airspace consolidation in the lungs.  No blunting of costophrenic angles.  No pneumothorax.  IMPRESSION: Cardiac enlargement.  No evidence of active pulmonary disease.  Original Report Authenticated By: Marlon Pel, M.D.    ASA. NTG, IV morphine, CXR and labs as above obtained/ reviewed. For elevated d-dimer with CP/ SOB, CT scan obtained to evaluate for possible PE.    Date: 02/15/2012  Rate: 69  Rhythm: normal sinus rhythm  QRS Axis: normal  Intervals: normal  ST/T Wave abnormalities: nonspecific ST changes  Conduction Disutrbances:none  Narrative Interpretation:   Old EKG Reviewed: none available   MDM   Chest pain with elevated d-dimer. I long discussion with patient regarding need for CT scan to evaluate for pulmonary embolism. Patient states that her friend is leaving the bar and she needs a ride home and does not think she needs a CAT scan. She states understanding risk of death or serious disability from failure to diagnose and treat a blood clot in her lung. Patient signs a may paperwork. She is alert and oriented x4. No indication for IVC. Patient left AMA against strong recommendations to stay for CT  scan.       Sunnie Nielsen, MD 02/15/12 [phone]

## 2012-02-15 ENCOUNTER — Emergency Department (HOSPITAL_COMMUNITY): Payer: Self-pay

## 2012-02-15 ENCOUNTER — Emergency Department (HOSPITAL_COMMUNITY)
Admission: EM | Admit: 2012-02-15 | Discharge: 2012-02-15 | Disposition: A | Discharge status: Home / Self Care | Payer: Self-pay | Attending: Emergency Medicine | Admitting: Emergency Medicine

## 2012-02-15 ENCOUNTER — Encounter (HOSPITAL_COMMUNITY): Payer: Self-pay | Admitting: Emergency Medicine

## 2012-02-15 DIAGNOSIS — I10 Essential (primary) hypertension: Secondary | ICD-10-CM | POA: Insufficient documentation

## 2012-02-15 DIAGNOSIS — R079 Chest pain, unspecified: Secondary | ICD-10-CM | POA: Insufficient documentation

## 2012-02-15 DIAGNOSIS — F17200 Nicotine dependence, unspecified, uncomplicated: Secondary | ICD-10-CM | POA: Insufficient documentation

## 2012-02-15 LAB — CBC
HCT: 36.4 % (ref 36.0–46.0)
Hemoglobin: 12.2 g/dL (ref 12.0–15.0)
MCH: 27.2 pg (ref 26.0–34.0)
MCHC: 33.5 g/dL (ref 30.0–36.0)
MCV: 81.1 fL (ref 78.0–100.0)
Platelets: 214 K/uL (ref 150–400)
RBC: 4.49 MIL/uL (ref 3.87–5.11)
RDW: 13.9 % (ref 11.5–15.5)
WBC: 10.6 K/uL — ABNORMAL HIGH (ref 4.0–10.5)

## 2012-02-15 LAB — POCT I-STAT, CHEM 8
BUN: 11 mg/dL (ref 6–23)
Calcium, Ion: 1.18 mmol/L (ref 1.12–1.23)
Chloride: 106 meq/L (ref 96–112)
Creatinine, Ser: 1.1 mg/dL (ref 0.50–1.10)
Glucose, Bld: 95 mg/dL (ref 70–99)
HCT: 37 % (ref 36.0–46.0)
Hemoglobin: 12.6 g/dL (ref 12.0–15.0)
Potassium: 3.8 meq/L (ref 3.5–5.1)
Sodium: 140 meq/L (ref 135–145)
TCO2: 21 mmol/L (ref 0–100)

## 2012-02-15 LAB — TROPONIN I: Troponin I: <0.3 ng/mL

## 2012-02-15 LAB — POCT I-STAT TROPONIN I: Troponin i, poc: 0 ng/mL (ref 0.00–0.08)

## 2012-02-15 MED ORDER — IBUPROFEN 800 MG PO TABS: 800.0000 mg | ORAL_TABLET | Freq: Three times a day (TID) | ORAL | Status: AC

## 2012-02-15 MED ORDER — SODIUM CHLORIDE 0.9 % IV SOLN
Freq: Once | INTRAVENOUS | Status: AC
  Administered 2012-02-15: 15:00:00 via INTRAVENOUS

## 2012-02-15 MED ORDER — ALBUTEROL SULFATE HFA 108 (90 BASE) MCG/ACT IN AERS
INHALATION_SPRAY | RESPIRATORY_TRACT | Status: AC
  Administered 2012-02-15: 17:00:00
  Filled 2012-02-15: qty 6.7

## 2012-02-15 MED ORDER — IOHEXOL 350 MG/ML SOLN
100.0000 mL | Freq: Once | INTRAVENOUS | Status: AC | PRN
  Administered 2012-02-15: 100 mL via INTRAVENOUS

## 2012-02-15 NOTE — ED Notes (Signed)
Pt reports chest pain level now a 3/10 but still has some pressure. Pt refuses 2nd nitro.

## 2012-02-15 NOTE — ED Notes (Signed)
Pt states that the reason she left yesterday is that her kids were by themselves. Pt staes pain never went away was given  325 asa and 1 nitro per ems. Nitro dropped her pressure to 99/43

## 2012-02-15 NOTE — ED Notes (Signed)
Went into room to take pt over to CT scanner Pt said that she had no ride home except for the ride that was waiting outside. Talked to nurse Jaci and Dr.Opitz and they said to have her sign an AMA form. I explained to pt that they were doing the CT scan to rule out possible blood clot because she had positive D dimers. Pt signed AMA form

## 2012-02-15 NOTE — ED Provider Notes (Signed)
History     CSN: [id number]  Arrival date & time 02/15/12  1302   First MD Initiated Contact with Patient 02/15/12 1330      Chief Complaint  Patient presents with  . Chest Pain    HPI The patient presents with concerns of ongoing chest pain.  She notes that her symptoms actually began yesterday, insidiously, prompted an ER visit.  After initial blood draw, evaluation, the patient was counseled that she needed a CAT scan to rule out blood clots.  She left AGAINST MEDICAL ADVICE.  She now returns with request for completion of her evaluation.  She notes that her pain continues, remains pressure-like, anterior, nonradiating, worse with exertion or deep inspiration. She denies any new disorientation, confusion, or other focal complaints. Past Medical History  Diagnosis Date  . Hypertension     Past Surgical History  Procedure Date  . Cesarean section     No family history on file.  History  Substance Use Topics  . Smoking status: Current Everyday Smoker -- 0.5 packs/day  . Smokeless tobacco: Not on file  . Alcohol Use: No    OB History    Grav Para Term Preterm Abortions TAB SAB Ect Mult Living                  Review of Systems  Constitutional: Negative for fever and chills.  HENT: Negative for neck pain and neck stiffness.   Eyes: Negative for pain.  Respiratory: Positive for shortness of breath.   Cardiovascular: Positive for chest pain.  Gastrointestinal: Negative for abdominal pain.  Genitourinary: Negative for dysuria.  Musculoskeletal: Negative for back pain.  Skin: Negative for rash.  Neurological: Negative for headaches.  All other systems reviewed and are negative.    Allergies  Review of patient's allergies indicates no known allergies.  Home Medications  No current outpatient prescriptions on file.  BP 124/66  Pulse 72  Temp 98 F (36.7 C) (Oral)  Resp 22  SpO2 100%  LMP 02/12/2012  Physical Exam  Constitutional: She is oriented to  person, place, and time. She appears well-developed and well-nourished.  HENT:  Head: Normocephalic and atraumatic.  Eyes: Conjunctivae and EOM are normal. Pupils are equal, round, and reactive to light.  Neck: Trachea normal. Neck supple. No thyromegaly present.  Cardiovascular: Normal rate, regular rhythm, S1 normal, S2 normal and normal pulses.     No systolic murmur is present   No diastolic murmur is present  Pulses:      Radial pulses are 2+ on the right side, and 2+ on the left side.  Pulmonary/Chest: Effort normal and breath sounds normal. She has no wheezes. She has no rhonchi. She has no rales.       chest wall tenderness without rash or crepitus   Abdominal: Soft. Normal appearance and bowel sounds are normal. There is no tenderness. There is no CVA tenderness and negative Murphy's sign.  Musculoskeletal:       BLE:s Calves nontender, no cords or erythema, negative Homans sign  Neurological: She is alert and oriented to person, place, and time. She has normal strength. No cranial nerve deficit or sensory deficit. GCS eye subscore is 4. GCS verbal subscore is 5. GCS motor subscore is 6.  Skin: Skin is warm and dry. No rash noted. She is not diaphoretic.  Psychiatric: Her speech is normal.       Cooperative and appropriate    ED Course  Procedures (including critical care time)  Labs Reviewed  TROPONIN I   Dg Chest Portable 1 View  02/15/2012  *RADIOLOGY REPORT*  Clinical Data: Chest pain and shortness of breath.  PORTABLE CHEST - 1 VIEW  Comparison: 12/21/2009  Findings: Mild cardiac enlargement with normal pulmonary vascularity.  Shallow inspiration.  No focal airspace consolidation in the lungs.  No blunting of costophrenic angles.  No pneumothorax.  IMPRESSION: Cardiac enlargement.  No evidence of active pulmonary disease.  Original Report Authenticated By: Marlon Pel, M.D.     No diagnosis found.  Cardiac 60 sinus rhythm normal pulse ox on room air  normal   Date: 02/15/2012  Rate: 59  Rhythm: normal sinus rhythm  QRS Axis: normal  Intervals: normal  ST/T Wave abnormalities: normal  Conduction Disutrbances: none  Narrative Interpretation: unremarkable    MDM  This 36 year old female presents for completion of evaluation of ongoing chest pain.  On my exam the patient is uncomfortable appearing, though not hypoxic, or tachycardic.  Given the patient's labs yesterday included a positive d-dimer, she had a new troponin sent, this is reassuring.  The patient's CT angiogram was also reassuring with the absence of new PE.  With multiple negative troponins, the patient was discharged to follow up with primary care physician for further evaluation of her reproducible chest pain.   Gerhard Munch, MD 02/15/12 [phone]

## 2012-02-15 NOTE — ED Notes (Signed)
Pt states cp and dizziness  since yesterday came to er yesterday but left ama states had labs draw etc has had some sob and pain on inspiration no cough no fever .

## 2012-02-15 NOTE — ED Notes (Signed)
Paper AMA form signed.  Patient left AMA.

## 2012-11-04 ENCOUNTER — Other Ambulatory Visit (HOSPITAL_COMMUNITY): Payer: Self-pay | Admitting: Obstetrics

## 2012-11-04 DIAGNOSIS — Z1231 Encounter for screening mammogram for malignant neoplasm of breast: Secondary | ICD-10-CM

## 2012-11-18 ENCOUNTER — Ambulatory Visit (HOSPITAL_COMMUNITY): Payer: Medicaid Other | Attending: Obstetrics

## 2013-10-09 ENCOUNTER — Other Ambulatory Visit (HOSPITAL_COMMUNITY): Payer: Self-pay | Admitting: Obstetrics

## 2013-10-09 DIAGNOSIS — Z1231 Encounter for screening mammogram for malignant neoplasm of breast: Secondary | ICD-10-CM

## 2013-10-14 ENCOUNTER — Ambulatory Visit (HOSPITAL_COMMUNITY): Payer: Medicaid Other

## 2013-10-15 ENCOUNTER — Emergency Department (HOSPITAL_COMMUNITY)
Admission: EM | Admit: 2013-10-15 | Discharge: 2013-10-15 | Disposition: A | Discharge status: Home / Self Care | Payer: Medicaid Other | Attending: Emergency Medicine | Admitting: Emergency Medicine

## 2013-10-15 ENCOUNTER — Encounter (HOSPITAL_COMMUNITY): Payer: Self-pay | Admitting: Emergency Medicine

## 2013-10-15 DIAGNOSIS — M549 Dorsalgia, unspecified: Secondary | ICD-10-CM

## 2013-10-15 DIAGNOSIS — I10 Essential (primary) hypertension: Secondary | ICD-10-CM | POA: Insufficient documentation

## 2013-10-15 DIAGNOSIS — M546 Pain in thoracic spine: Secondary | ICD-10-CM | POA: Insufficient documentation

## 2013-10-15 DIAGNOSIS — M25519 Pain in unspecified shoulder: Secondary | ICD-10-CM | POA: Insufficient documentation

## 2013-10-15 DIAGNOSIS — F17200 Nicotine dependence, unspecified, uncomplicated: Secondary | ICD-10-CM | POA: Insufficient documentation

## 2013-10-15 MED ORDER — OXYCODONE-ACETAMINOPHEN 5-325 MG PO TABS
1.0000 | ORAL_TABLET | Freq: Once | ORAL | Status: AC
  Administered 2013-10-15: 1 via ORAL
  Filled 2013-10-15: qty 1

## 2013-10-15 MED ORDER — TRAMADOL HCL 50 MG PO TABS: 50.0000 mg | ORAL_TABLET | Freq: Four times a day (QID) | ORAL | Status: DC | PRN

## 2013-10-15 NOTE — ED Provider Notes (Signed)
CSN: [id number]     Arrival date & time 10/15/13  2130 History  This chart was scribed for non-physician practitioner, Sharilyn Sites, PA-C working with Juliet Rude. Rubin Payor, MD by Greggory Stallion, ED scribe. This patient was seen in room TR05C/TR05C and the patient's care was started at 9:04 AM.    Chief Complaint  Patient presents with  . arm pain and numbness    The history is provided by the patient. No language interpreter was used.   HPI Comments: Nicole [name] is a 38 y.o. female who presents to the Emergency Department complaining of gradual onset, constant upper back pain that radiates into her right arm that started yesterday. Pt states she also has numbness in her right fingers. Denies new or old injuries to her back.  Denies any confusion, changes in speech, difficulty walking, numbness or weakness of other extremities.  Pt states she is in school and has a lot of stress.  No medications taken PTA.  Past Medical History  Diagnosis Date  . Hypertension    Past Surgical History  Procedure Laterality Date  . Cesarean section     No family history on file. History  Substance Use Topics  . Smoking status: Current Every Day Smoker -- 0.50 packs/day  . Smokeless tobacco: Not on file  . Alcohol Use: No   OB History   Grav Para Term Preterm Abortions TAB SAB Ect Mult Living                 Review of Systems  Musculoskeletal: Positive for back pain and myalgias.  All other systems reviewed and are negative.   Allergies  Review of patient's allergies indicates no known allergies.  Home Medications  No current outpatient prescriptions on file.  BP 120/63  Pulse 77  Temp(Src) 98.2 F (36.8 C) (Oral)  Resp 18  Ht 5\' 7"  (1.702 m)  Wt 332 lb (150.594 kg)  BMI 51.99 kg/m2  SpO2 99%  LMP 09/23/2013  Physical Exam  Nursing note and vitals reviewed. Constitutional: She is oriented to person, place, and time. She appears well-developed and well-nourished. No distress.   HENT:  Head: Normocephalic and atraumatic.  Mouth/Throat: Oropharynx is clear and moist.  Eyes: Conjunctivae and EOM are normal. Pupils are equal, round, and reactive to light.  Neck: Normal range of motion.  Cardiovascular: Normal rate, regular rhythm and normal heart sounds.   Pulmonary/Chest: Effort normal and breath sounds normal. She has no wheezes.  Musculoskeletal: Normal range of motion. She exhibits no edema.  Tenderness to palpation of right inferior scapular region. No gross deformities. No midline tenderness or step-off.  Full ROM of right shoulder, elbow and wrist. Moving all fingers appropriately. Sensation intact diffusely throughout arm. Strong radial pulse.  Neurological: She is alert and oriented to person, place, and time.  AAOx3, answering questions appropriately; equal strength UE and LE bilaterally; CN grossly intact; moves all extremities appropriately without ataxia; no focal neuro deficits or facial asymmetry appreciated  Skin: Skin is warm and dry. She is not diaphoretic.  Psychiatric: She has a normal mood and affect.    ED Course  Procedures (including critical care time)  DIAGNOSTIC STUDIES: Oxygen Saturation is 99% on RA, normal by my interpretation.    COORDINATION OF CARE: 9:06 AM-Discussed treatment plan which includes pain medication with pt at bedside and pt agreed to plan.   Labs Review Labs Reviewed - No data to display Imaging Review No results found.   EKG Interpretation None  MDM   Final diagnoses:  Back pain   On exam, patient has tenderness of her right inferior scapular region without gross deformity. She has subjective numbness of her right arm, however on exam she has full sensation to light touch throughout arm, full ROM, and normal strength when compared to other extremities. She has no focal neurologic deficit. I do not believe her symptoms are due to CVA, likely radicular sx from back.  Pt given percocet in the ED, Rx  tramadol and advised may use heat therapy to help with sx.  She will FU with her PCP.  Discussed plan with patient, she nodded understanding and agreed to plan of care. Return precautions and worsening symptoms  I personally performed the services described in this documentation, which was scribed in my presence. The recorded information has been reviewed and is accurate.  Garlon HatchetLisa M Nikeisha Klutz, PA-C 10/15/13 [phone]

## 2013-10-15 NOTE — ED Notes (Signed)
PT c/o R arm pain and numbness.  Also c/o increased neck pain when she looks to the R.

## 2013-10-15 NOTE — Discharge Instructions (Signed)
Take the prescribed medication as directed.  Advise taking with tylenol for maximum benefit.  May wish to apply heat to back to help with pain and discomfort. Follow-up with your primary care physician. Return to the ED for new or worsening symptoms.

## 2013-10-15 NOTE — ED Notes (Addendum)
Documented on wrong chart 

## 2013-10-15 NOTE — ED Notes (Signed)
Pt c/o "sharp" pain right sub-scapular area. No known injury. States has intermittent numbness in right hand. Pt is right hand dominate, stay at home mom. Denies CP.

## 2013-10-16 NOTE — ED Provider Notes (Signed)
Medical screening examination/treatment/procedure(s) were performed by non-physician practitioner and as supervising physician I was immediately available for consultation/collaboration.   EKG Interpretation None       Jai Steil R. Ellianne Gowen, MD 10/16/13 0702 

## 2013-10-19 ENCOUNTER — Ambulatory Visit (HOSPITAL_COMMUNITY): Admission: RE | Admit: 2013-10-19 | Payer: Medicaid Other | Source: Ambulatory Visit

## 2014-03-24 DIAGNOSIS — M170 Bilateral primary osteoarthritis of knee: Secondary | ICD-10-CM | POA: Insufficient documentation

## 2014-03-24 DIAGNOSIS — F40298 Other specified phobia: Secondary | ICD-10-CM | POA: Insufficient documentation

## 2014-03-24 DIAGNOSIS — G8929 Other chronic pain: Secondary | ICD-10-CM | POA: Insufficient documentation

## 2014-03-24 DIAGNOSIS — I10 Essential (primary) hypertension: Secondary | ICD-10-CM | POA: Insufficient documentation

## 2014-03-24 DIAGNOSIS — Z720 Tobacco use: Secondary | ICD-10-CM | POA: Insufficient documentation

## 2014-03-25 ENCOUNTER — Encounter (HOSPITAL_COMMUNITY)
Admission: EM | Disposition: A | Discharge status: Home / Self Care | Payer: Self-pay | Source: Home / Self Care | Attending: Emergency Medicine

## 2014-03-25 ENCOUNTER — Emergency Department (HOSPITAL_COMMUNITY): Payer: Medicaid Other

## 2014-03-25 ENCOUNTER — Encounter (HOSPITAL_COMMUNITY): Payer: Self-pay | Admitting: Emergency Medicine

## 2014-03-25 ENCOUNTER — Encounter (HOSPITAL_COMMUNITY): Payer: Medicaid Other | Admitting: Anesthesiology

## 2014-03-25 ENCOUNTER — Observation Stay (HOSPITAL_COMMUNITY): Payer: Medicaid Other | Admitting: Anesthesiology

## 2014-03-25 ENCOUNTER — Observation Stay (HOSPITAL_COMMUNITY)
Admission: EM | Admit: 2014-03-25 | Discharge: 2014-03-26 | Disposition: A | Discharge status: Home / Self Care | Payer: Medicaid Other | Attending: General Surgery | Admitting: General Surgery

## 2014-03-25 DIAGNOSIS — K219 Gastro-esophageal reflux disease without esophagitis: Secondary | ICD-10-CM | POA: Diagnosis not present

## 2014-03-25 DIAGNOSIS — N1330 Unspecified hydronephrosis: Secondary | ICD-10-CM | POA: Diagnosis not present

## 2014-03-25 DIAGNOSIS — I10 Essential (primary) hypertension: Secondary | ICD-10-CM

## 2014-03-25 DIAGNOSIS — J449 Chronic obstructive pulmonary disease, unspecified: Secondary | ICD-10-CM | POA: Insufficient documentation

## 2014-03-25 DIAGNOSIS — K3580 Unspecified acute appendicitis: Secondary | ICD-10-CM

## 2014-03-25 DIAGNOSIS — R160 Hepatomegaly, not elsewhere classified: Secondary | ICD-10-CM | POA: Insufficient documentation

## 2014-03-25 DIAGNOSIS — J4489 Other specified chronic obstructive pulmonary disease: Secondary | ICD-10-CM | POA: Insufficient documentation

## 2014-03-25 DIAGNOSIS — K429 Umbilical hernia without obstruction or gangrene: Secondary | ICD-10-CM | POA: Diagnosis not present

## 2014-03-25 DIAGNOSIS — F17200 Nicotine dependence, unspecified, uncomplicated: Secondary | ICD-10-CM | POA: Diagnosis not present

## 2014-03-25 DIAGNOSIS — Z6841 Body Mass Index (BMI) 40.0 and over, adult: Secondary | ICD-10-CM | POA: Insufficient documentation

## 2014-03-25 HISTORY — PX: LAPAROSCOPIC APPENDECTOMY: SHX408

## 2014-03-25 LAB — COMPREHENSIVE METABOLIC PANEL
ALT: 25 U/L (ref 0–35)
AST: 25 U/L (ref 0–37)
Albumin: 3.4 g/dL — ABNORMAL LOW (ref 3.5–5.2)
Alkaline Phosphatase: 76 U/L (ref 39–117)
Anion gap: 10 (ref 5–15)
BUN: 9 mg/dL (ref 6–23)
CO2: 26 mEq/L (ref 19–32)
Calcium: 9.3 mg/dL (ref 8.4–10.5)
Chloride: 104 mEq/L (ref 96–112)
Creatinine, Ser: 0.85 mg/dL (ref 0.50–1.10)
GFR calc Af Amer: >90 mL/min (ref >90)
GFR calc non Af Amer: 85 mL/min — ABNORMAL LOW (ref >90)
Glucose, Bld: 105 mg/dL — ABNORMAL HIGH (ref 70–99)
Potassium: 4.5 mEq/L (ref 3.7–5.3)
Sodium: 140 mEq/L (ref 137–147)
Total Bilirubin: 0.3 mg/dL (ref 0.3–1.2)
Total Protein: 7.1 g/dL (ref 6.0–8.3)

## 2014-03-25 LAB — URINALYSIS, ROUTINE W REFLEX MICROSCOPIC
Bilirubin Urine: NEGATIVE
Glucose, UA: NEGATIVE mg/dL
Hgb urine dipstick: NEGATIVE
Ketones, ur: NEGATIVE mg/dL
Nitrite: NEGATIVE
Protein, ur: NEGATIVE mg/dL
Specific Gravity, Urine: 1.018 (ref 1.005–1.030)
Urobilinogen, UA: 0.2 mg/dL (ref 0.0–1.0)
pH: 5 (ref 5.0–8.0)

## 2014-03-25 LAB — LIPASE, BLOOD: Lipase: 27 U/L (ref 11–59)

## 2014-03-25 LAB — PREGNANCY, URINE: Preg Test, Ur: NEGATIVE

## 2014-03-25 LAB — CBC WITH DIFFERENTIAL/PLATELET
Basophils Absolute: 0 10*3/uL (ref 0.0–0.1)
Basophils Relative: 0 % (ref 0–1)
Eosinophils Absolute: 0.1 10*3/uL (ref 0.0–0.7)
Eosinophils Relative: 1 % (ref 0–5)
HCT: 41.1 % (ref 36.0–46.0)
Hemoglobin: 13.2 g/dL (ref 12.0–15.0)
Lymphocytes Relative: 23 % (ref 12–46)
Lymphs Abs: 2.6 10*3/uL (ref 0.7–4.0)
MCH: 26.8 pg (ref 26.0–34.0)
MCHC: 32.1 g/dL (ref 30.0–36.0)
MCV: 83.4 fL (ref 78.0–100.0)
Monocytes Absolute: 0.8 10*3/uL (ref 0.1–1.0)
Monocytes Relative: 7 % (ref 3–12)
Neutro Abs: 8.1 10*3/uL — ABNORMAL HIGH (ref 1.7–7.7)
Neutrophils Relative %: 69 % (ref 43–77)
Platelets: 239 10*3/uL (ref 150–400)
RBC: 4.93 MIL/uL (ref 3.87–5.11)
RDW: 14 % (ref 11.5–15.5)
WBC: 11.6 10*3/uL — ABNORMAL HIGH (ref 4.0–10.5)

## 2014-03-25 LAB — URINE MICROSCOPIC-ADD ON

## 2014-03-25 SURGERY — APPENDECTOMY, LAPAROSCOPIC
Anesthesia: General | Site: Abdomen

## 2014-03-25 MED ORDER — DEXTROSE 5 % IV SOLN
2.0000 g | INTRAVENOUS | Status: AC
  Administered 2014-03-25: 2 g via INTRAVENOUS
  Filled 2014-03-25: qty 2

## 2014-03-25 MED ORDER — ENOXAPARIN SODIUM 40 MG/0.4ML ~~LOC~~ SOLN
40.0000 mg | SUBCUTANEOUS | Status: DC
  Filled 2014-03-25: qty 0.4

## 2014-03-25 MED ORDER — METRONIDAZOLE IN NACL 5-0.79 MG/ML-% IV SOLN
500.0000 mg | INTRAVENOUS | Status: AC
  Administered 2014-03-25: 500 mg via INTRAVENOUS
  Filled 2014-03-25: qty 100

## 2014-03-25 MED ORDER — ROCURONIUM BROMIDE 50 MG/5ML IV SOLN
INTRAVENOUS | Status: AC
  Filled 2014-03-25: qty 1

## 2014-03-25 MED ORDER — PROMETHAZINE HCL 25 MG/ML IJ SOLN: 6.2500 mg | INTRAMUSCULAR | Status: DC | PRN

## 2014-03-25 MED ORDER — HYDROCODONE-ACETAMINOPHEN 5-325 MG PO TABS: 1.0000 | ORAL_TABLET | ORAL | Status: DC | PRN

## 2014-03-25 MED ORDER — LIDOCAINE HCL (CARDIAC) 20 MG/ML IV SOLN
INTRAVENOUS | Status: DC | PRN
  Administered 2014-03-25: 100 mg via INTRAVENOUS

## 2014-03-25 MED ORDER — DEXAMETHASONE SODIUM PHOSPHATE 4 MG/ML IJ SOLN
INTRAMUSCULAR | Status: AC
  Filled 2014-03-25: qty 1

## 2014-03-25 MED ORDER — ONDANSETRON HCL 4 MG/2ML IJ SOLN
4.0000 mg | Freq: Four times a day (QID) | INTRAMUSCULAR | Status: DC | PRN
  Administered 2014-03-25: 4 mg via INTRAVENOUS
  Filled 2014-03-25: qty 2

## 2014-03-25 MED ORDER — HYDROMORPHONE HCL PF 1 MG/ML IJ SOLN
0.2500 mg | INTRAMUSCULAR | Status: DC | PRN
  Administered 2014-03-25: 1 mg via INTRAVENOUS

## 2014-03-25 MED ORDER — DEXAMETHASONE SODIUM PHOSPHATE 4 MG/ML IJ SOLN
INTRAMUSCULAR | Status: DC | PRN
  Administered 2014-03-25: 4 mg via INTRAVENOUS

## 2014-03-25 MED ORDER — GLYCOPYRROLATE 0.2 MG/ML IJ SOLN
INTRAMUSCULAR | Status: AC
  Filled 2014-03-25: qty 2

## 2014-03-25 MED ORDER — ROCURONIUM BROMIDE 100 MG/10ML IV SOLN
INTRAVENOUS | Status: DC | PRN
Start: 2014-03-25 — End: 2014-03-25
  Administered 2014-03-25: 50 mg via INTRAVENOUS

## 2014-03-25 MED ORDER — ACETAMINOPHEN 650 MG RE SUPP: 650.0000 mg | Freq: Four times a day (QID) | RECTAL | Status: DC | PRN

## 2014-03-25 MED ORDER — HYDROMORPHONE HCL PF 1 MG/ML IJ SOLN
1.0000 mg | Freq: Once | INTRAMUSCULAR | Status: AC
  Administered 2014-03-25: 1 mg via INTRAVENOUS
  Filled 2014-03-25: qty 1

## 2014-03-25 MED ORDER — SUCCINYLCHOLINE CHLORIDE 20 MG/ML IJ SOLN
INTRAMUSCULAR | Status: DC | PRN
Start: 2014-03-25 — End: 2014-03-25
  Administered 2014-03-25: 120 mg via INTRAVENOUS

## 2014-03-25 MED ORDER — SODIUM CHLORIDE 0.9 % IV SOLN
INTRAVENOUS | Status: DC
  Administered 2014-03-25: 17:00:00 via INTRAVENOUS

## 2014-03-25 MED ORDER — PROPOFOL 10 MG/ML IV BOLUS
INTRAVENOUS | Status: AC
  Filled 2014-03-25: qty 20

## 2014-03-25 MED ORDER — SODIUM CHLORIDE 0.9 % IV SOLN: INTRAVENOUS | Status: DC

## 2014-03-25 MED ORDER — ONDANSETRON HCL 4 MG/2ML IJ SOLN: 4.0000 mg | Freq: Once | INTRAMUSCULAR | Status: DC

## 2014-03-25 MED ORDER — FENTANYL CITRATE 0.05 MG/ML IJ SOLN
INTRAMUSCULAR | Status: AC
  Filled 2014-03-25: qty 5

## 2014-03-25 MED ORDER — IOHEXOL 300 MG/ML  SOLN
25.0000 mL | Freq: Once | INTRAMUSCULAR | Status: AC | PRN
  Administered 2014-03-25: 25 mL via ORAL

## 2014-03-25 MED ORDER — HYDROMORPHONE HCL PF 1 MG/ML IJ SOLN: 1.0000 mg | Freq: Once | INTRAMUSCULAR | Status: DC

## 2014-03-25 MED ORDER — ONDANSETRON HCL 4 MG/2ML IJ SOLN
4.0000 mg | Freq: Once | INTRAMUSCULAR | Status: AC
  Administered 2014-03-25: 4 mg via INTRAVENOUS
  Filled 2014-03-25: qty 2

## 2014-03-25 MED ORDER — MIDAZOLAM HCL 2 MG/2ML IJ SOLN
INTRAMUSCULAR | Status: AC
  Filled 2014-03-25: qty 2

## 2014-03-25 MED ORDER — PROMETHAZINE HCL 25 MG/ML IJ SOLN
12.5000 mg | Freq: Once | INTRAMUSCULAR | Status: AC
  Administered 2014-03-25: 12.5 mg via INTRAVENOUS
  Filled 2014-03-25 (×2): qty 1

## 2014-03-25 MED ORDER — ARTIFICIAL TEARS OP OINT
TOPICAL_OINTMENT | OPHTHALMIC | Status: DC | PRN
  Administered 2014-03-25: 1 via OPHTHALMIC

## 2014-03-25 MED ORDER — HYDROMORPHONE HCL PF 1 MG/ML IJ SOLN
INTRAMUSCULAR | Status: AC
  Filled 2014-03-25: qty 1

## 2014-03-25 MED ORDER — 0.9 % SODIUM CHLORIDE (POUR BTL) OPTIME
TOPICAL | Status: DC | PRN
  Administered 2014-03-25: 1000 mL

## 2014-03-25 MED ORDER — MORPHINE SULFATE 2 MG/ML IJ SOLN: 2.0000 mg | INTRAMUSCULAR | Status: DC | PRN

## 2014-03-25 MED ORDER — ONDANSETRON HCL 4 MG/2ML IJ SOLN
INTRAMUSCULAR | Status: DC | PRN
  Administered 2014-03-25: 4 mg via INTRAVENOUS

## 2014-03-25 MED ORDER — SODIUM CHLORIDE 0.9 % IR SOLN
Status: DC | PRN
  Administered 2014-03-25: 1000 mL

## 2014-03-25 MED ORDER — BUPIVACAINE-EPINEPHRINE (PF) 0.25% -1:200000 IJ SOLN
INTRAMUSCULAR | Status: AC
  Filled 2014-03-25: qty 30

## 2014-03-25 MED ORDER — PROPOFOL 10 MG/ML IV BOLUS
INTRAVENOUS | Status: DC | PRN
Start: 2014-03-25 — End: 2014-03-25
  Administered 2014-03-25: 200 mg via INTRAVENOUS

## 2014-03-25 MED ORDER — SODIUM CHLORIDE 0.9 % IV BOLUS (SEPSIS)
1000.0000 mL | Freq: Once | INTRAVENOUS | Status: AC
  Administered 2014-03-25: 1000 mL via INTRAVENOUS

## 2014-03-25 MED ORDER — OXYCODONE HCL 5 MG PO TABS: 5.0000 mg | ORAL_TABLET | Freq: Once | ORAL | Status: DC | PRN

## 2014-03-25 MED ORDER — GLYCOPYRROLATE 0.2 MG/ML IJ SOLN
INTRAMUSCULAR | Status: DC | PRN
  Administered 2014-03-25: 0.4 mg via INTRAVENOUS

## 2014-03-25 MED ORDER — ACETAMINOPHEN 325 MG PO TABS: 650.0000 mg | ORAL_TABLET | Freq: Four times a day (QID) | ORAL | Status: DC | PRN

## 2014-03-25 MED ORDER — FENTANYL CITRATE 0.05 MG/ML IJ SOLN
INTRAMUSCULAR | Status: DC | PRN
  Administered 2014-03-25: 100 ug via INTRAVENOUS
  Administered 2014-03-25 (×3): 50 ug via INTRAVENOUS

## 2014-03-25 MED ORDER — ONDANSETRON HCL 4 MG/2ML IJ SOLN
INTRAMUSCULAR | Status: AC
  Filled 2014-03-25: qty 2

## 2014-03-25 MED ORDER — LACTATED RINGERS IV SOLN
INTRAVENOUS | Status: DC | PRN
  Administered 2014-03-25 (×2): via INTRAVENOUS

## 2014-03-25 MED ORDER — IOHEXOL 300 MG/ML  SOLN
80.0000 mL | Freq: Once | INTRAMUSCULAR | Status: AC | PRN
  Administered 2014-03-25: 80 mL via INTRAVENOUS

## 2014-03-25 MED ORDER — MORPHINE SULFATE 2 MG/ML IJ SOLN
2.0000 mg | INTRAMUSCULAR | Status: DC | PRN
  Administered 2014-03-26: 2 mg via INTRAVENOUS
  Filled 2014-03-25: qty 1

## 2014-03-25 MED ORDER — BUPIVACAINE-EPINEPHRINE 0.25% -1:200000 IJ SOLN
INTRAMUSCULAR | Status: DC | PRN
  Administered 2014-03-25: 10 mL

## 2014-03-25 MED ORDER — MIDAZOLAM HCL 5 MG/5ML IJ SOLN
INTRAMUSCULAR | Status: DC | PRN
  Administered 2014-03-25 (×2): 1 mg via INTRAVENOUS

## 2014-03-25 MED ORDER — LIDOCAINE HCL (CARDIAC) 20 MG/ML IV SOLN
INTRAVENOUS | Status: AC
  Filled 2014-03-25: qty 5

## 2014-03-25 MED ORDER — LORAZEPAM 2 MG/ML IJ SOLN: 0.5000 mg | Freq: Three times a day (TID) | INTRAMUSCULAR | Status: DC | PRN

## 2014-03-25 MED ORDER — NEOSTIGMINE METHYLSULFATE 10 MG/10ML IV SOLN
INTRAVENOUS | Status: DC | PRN
  Administered 2014-03-25: 3 mg via INTRAVENOUS

## 2014-03-25 MED ORDER — OXYCODONE HCL 5 MG/5ML PO SOLN: 5.0000 mg | Freq: Once | ORAL | Status: DC | PRN

## 2014-03-25 SURGICAL SUPPLY — 42 items
APPLIER CLIP ROT 10 11.4 M/L (STAPLE)
BENZOIN TINCTURE PRP APPL 2/3 (GAUZE/BANDAGES/DRESSINGS) ×2 IMPLANT
BLADE SURG ROTATE 9660 (MISCELLANEOUS) IMPLANT
CANISTER SUCTION 2500CC (MISCELLANEOUS) ×2 IMPLANT
CHLORAPREP W/TINT 26ML (MISCELLANEOUS) ×2 IMPLANT
CLIP APPLIE ROT 10 11.4 M/L (STAPLE) IMPLANT
CLOSURE STERI-STRIP 1/4X4 (GAUZE/BANDAGES/DRESSINGS) ×2 IMPLANT
COVER SURGICAL LIGHT HANDLE (MISCELLANEOUS) ×2 IMPLANT
CUTTER LINEAR ENDO 35 ETS (STAPLE) ×2 IMPLANT
CUTTER LINEAR ENDO 35 ETS TH (STAPLE) IMPLANT
DERMABOND ADVANCED (GAUZE/BANDAGES/DRESSINGS) ×1
DERMABOND ADVANCED .7 DNX12 (GAUZE/BANDAGES/DRESSINGS) ×1 IMPLANT
DRAPE UTILITY 15X26 W/TAPE STR (DRAPE) ×4 IMPLANT
DRSG TEGADERM 2-3/8X2-3/4 SM (GAUZE/BANDAGES/DRESSINGS) ×6 IMPLANT
ELECT REM PT RETURN 9FT ADLT (ELECTROSURGICAL) ×2
ELECTRODE REM PT RTRN 9FT ADLT (ELECTROSURGICAL) ×1 IMPLANT
ENDOLOOP SUT PDS II  0 18 (SUTURE)
ENDOLOOP SUT PDS II 0 18 (SUTURE) IMPLANT
GLOVE BIOGEL PI IND STRL 8 (GLOVE) ×1 IMPLANT
GLOVE BIOGEL PI INDICATOR 8 (GLOVE) ×1
GLOVE ECLIPSE 7.5 STRL STRAW (GLOVE) ×2 IMPLANT
GOWN STRL REUS W/ TWL LRG LVL3 (GOWN DISPOSABLE) ×3 IMPLANT
GOWN STRL REUS W/TWL LRG LVL3 (GOWN DISPOSABLE) ×3
KIT BASIN OR (CUSTOM PROCEDURE TRAY) ×2 IMPLANT
KIT ROOM TURNOVER OR (KITS) ×2 IMPLANT
NS IRRIG 1000ML POUR BTL (IV SOLUTION) ×2 IMPLANT
PAD ARMBOARD 7.5X6 YLW CONV (MISCELLANEOUS) ×4 IMPLANT
PENCIL BUTTON HOLSTER BLD 10FT (ELECTRODE) IMPLANT
POUCH SPECIMEN RETRIEVAL 10MM (ENDOMECHANICALS) ×2 IMPLANT
RELOAD /EVU35 (ENDOMECHANICALS) IMPLANT
RELOAD CUTTER ETS 35MM STAND (ENDOMECHANICALS) IMPLANT
SCALPEL HARMONIC ACE (MISCELLANEOUS) ×2 IMPLANT
SET IRRIG TUBING LAPAROSCOPIC (IRRIGATION / IRRIGATOR) ×2 IMPLANT
SPECIMEN JAR SMALL (MISCELLANEOUS) ×2 IMPLANT
SUT MNCRL AB 4-0 PS2 18 (SUTURE) ×2 IMPLANT
TOWEL OR 17X24 6PK STRL BLUE (TOWEL DISPOSABLE) ×2 IMPLANT
TOWEL OR 17X26 10 PK STRL BLUE (TOWEL DISPOSABLE) ×2 IMPLANT
TRAY FOLEY CATH 16FR SILVER (SET/KITS/TRAYS/PACK) ×2 IMPLANT
TRAY LAPAROSCOPIC (CUSTOM PROCEDURE TRAY) ×2 IMPLANT
TROCAR XCEL 12X100 BLDLESS (ENDOMECHANICALS) ×2 IMPLANT
TROCAR XCEL BLUNT TIP 100MML (ENDOMECHANICALS) ×2 IMPLANT
TROCAR XCEL NON-BLD 5MMX100MML (ENDOMECHANICALS) ×2 IMPLANT

## 2014-03-25 NOTE — ED Notes (Signed)
PA at bedside.

## 2014-03-25 NOTE — ED Provider Notes (Signed)
CSN: [id number]     Arrival date & time 03/25/14  1004 History   First MD Initiated Contact with Patient 03/25/14 1023     Chief Complaint  Patient presents with  . Abdominal Pain     (Consider location/radiation/quality/duration/timing/severity/associated sxs/prior Treatment) HPI Patient presents to the emergency department with abdominal pain, that started in the epigastric region and is more right-sided at this time.  The patient, states, that she's had nausea, vomiting, since the pain started.  The pain started 3 days, ago.  The patient, states, that she's not had any chest pain, shortness of breath, weakness, dizziness, dysuria, headache, blurred vision, back pain, fever, rash, or syncope.  The patient, states she did not take any medications prior to arrival.  Palpation makes the pain, worse. Past Medical History  Diagnosis Date  . Hypertension    Past Surgical History  Procedure Laterality Date  . Cesarean section     History reviewed. No pertinent family history. History  Substance Use Topics  . Smoking status: Current Every Day Smoker -- 0.50 packs/day  . Smokeless tobacco: Not on file  . Alcohol Use: No   OB History   Grav Para Term Preterm Abortions TAB SAB Ect Mult Living                 Review of Systems  All other systems negative except as documented in the HPI. All pertinent positives and negatives as reviewed in the HPI.    Allergies  Review of patient's allergies indicates no known allergies.  Home Medications   Prior to Admission medications   Not on File   BP 132/68  Pulse 61  Temp(Src) 98.1 F (36.7 C) (Oral)  Resp 17  Ht  (1.702 m)  Wt 300 lb (136.079 kg)  BMI 46.98 kg/m2  SpO2 100%  LMP 03/09/2014 Physical Exam  Constitutional: She is oriented to person, place, and time. She appears well-developed and well-nourished. No distress.  HENT:  Head: Normocephalic and atraumatic.  Mouth/Throat: Oropharynx is clear and moist.  Eyes:  Pupils are equal, round, and reactive to light.  Neck: Normal range of motion. Neck supple.  Cardiovascular: Normal rate, regular rhythm and normal heart sounds.  Exam reveals no gallop and no friction rub.   No murmur heard. Pulmonary/Chest: Effort normal and breath sounds normal. No respiratory distress.  Abdominal: Soft. Normal appearance and bowel sounds are normal. She exhibits no distension. There is tenderness. There is no rebound and no guarding.    Neurological: She is alert and oriented to person, place, and time.  Skin: Skin is warm and dry.    ED Course  Procedures (including critical care time) Labs Review Labs Reviewed  CBC WITH DIFFERENTIAL - Abnormal; Notable for the following:    WBC 11.6 (*)    Neutro Abs 8.1 (*)    All other components within normal limits  COMPREHENSIVE METABOLIC PANEL - Abnormal; Notable for the following:    Glucose, Bld 105 (*)    Albumin 3.4 (*)    GFR calc non Af Amer 85 (*)    All other components within normal limits  URINALYSIS, ROUTINE W REFLEX MICROSCOPIC - Abnormal; Notable for the following:    APPearance CLOUDY (*)    Leukocytes, UA TRACE (*)    All other components within normal limits  URINE MICROSCOPIC-ADD ON - Abnormal; Notable for the following:    Squamous Epithelial / LPF FEW (*)    All other components within normal limits  URINE CULTURE  LIPASE, BLOOD  PREGNANCY, URINE    Imaging Review US Abdomen Complete  03/25/2014   CLINICAL DATA:  Upper abdominal pain.  EXAM: ULTRASOUND ABDOMEN COMPLETE  COMPARISON:  None.  FINDINGS: Gallbladder:  No gallstones or wall thickening visualized. No sonographic Murphy sign noted.  Common bile duct:  Diameter: 0.5 cm  Liver:  No focal lesion identified. Within normal limits in parenchymal echogenicity.  IVC:  No abnormality visualized.  Pancreas:  Visualized portion unremarkable.  Spleen:  Size and appearance within normal limits.  Right Kidney:  Length: 14.4 cm. Echogenicity within  normal limits. No mass or hydronephrosis visualized.  Left Kidney:  Length: 15.1 cm. Echogenicity within normal limits. No mass or hydronephrosis visualized.  Abdominal aorta:  No aneurysm visualized.  Other findings:  None.  IMPRESSION: Mild left hydronephrosis. Cause for the finding is not identified. CT abdomen and pelvis could be used for further evaluation.  Negative for gallstones.   Electronically Signed   By: Drusilla Kanner M.D.   On: 03/25/2014 13:34   Ct Abdomen Pelvis W Contrast  03/25/2014   CLINICAL DATA:  Abdominal pain ; mild left-sided hydronephrosis  EXAM: CT ABDOMEN AND PELVIS WITH CONTRAST  TECHNIQUE: Multidetector CT imaging of the abdomen and pelvis was performed using the standard protocol following bolus administration of intravenous contrast.  CONTRAST:  80mL OMNIPAQUE IOHEXOL 300 MG/ML  SOLN  COMPARISON:  Abdominal ultrasound March 25, 2014  FINDINGS: Lung bases are clear.  There is a small hiatal hernia.  Liver is prominent measuring 19.9 cm in length. No focal liver lesions are identified. Gallbladder wall is not thickened. There is no appreciable biliary duct dilatation.  Spleen, pancreas, and adrenals appear within normal limits.  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 No renal masses are identified on either side. There is a new mildly prominent extrarenal pelvis on each side. There is currently no appreciable hydronephrosis. There is no renal or ureteral calculus on either side. There is a circumaortic left renal vein, an anatomic variant.  In the pelvis, the urinary bladder is midline with wall thickness within normal limits. There is a fat containing mass in the left ovary measuring 1.8 x 1.4 cm consistent with a small dermoid. There is a dominant follicle in the right ovary measuring 2.4 by 2.1 cm. No other pelvic masses are appreciable.  Inflammation surrounding the distal aspect of the appendix. The distal appendix has an ill-defined wall with appendiceal  thickening to 1.2 cm in this portion of the appendix. These findings are felt to represent a degree of acute appendicitis involving the distal aspect of the appendix. This appearance is best seen on axial slice 53 series 2.  There is a focal ventral hernia containing only fat.  There is no bowel obstruction. No free air or portal venous air. There are scattered diverticula throughout the colon without diverticulitis.  There is no ascites, adenopathy, or abscess in the abdomen or pelvis. There is no appreciable abdominal aortic aneurysm. There are no blastic or lytic bone lesions.  IMPRESSION: Evidence of acute appendicitis involving the distal appendix. No abscess.  Scattered diverticula throughout the colon without diverticulitis.  Ventral hernia containing only fat. There is also a small hiatal hernia.  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  No appreciable hydronephrosis. No renal or ureteral calculus on either side. Right kidney mildly malrotated.  Liver enlarged without focal lesions seen.  Critical Value/emergent results were called by telephone at the time of interpretation on  03/25/2014 at 3:39 pm to Dr. Charlestine Night , who verbally acknowledged these results.   Electronically Signed   By: Bretta Bang M.D.   On: 03/25/2014 15:39      Carlyle Dolly, PA-C 03/25/14 1543

## 2014-03-25 NOTE — Transfer of Care (Signed)
Immediate Anesthesia Transfer of Care Note  Patient: Nicole [name]  Procedure(s) Performed: Procedure(s): APPENDECTOMY LAPAROSCOPIC (N/A)  Patient Location: PACU  Anesthesia Type:General  Level of Consciousness: awake, alert , oriented and patient cooperative  Airway & Oxygen Therapy: Patient Spontanous Breathing and Patient connected to nasal cannula oxygen  Post-op Assessment: Report given to PACU RN, Post -op Vital signs reviewed and stable and Patient moving all extremities X 4  Post vital signs: Reviewed and stable  Complications: No apparent anesthesia complications

## 2014-03-25 NOTE — ED Notes (Signed)
Pt returned from US

## 2014-03-25 NOTE — Anesthesia Preprocedure Evaluation (Addendum)
Anesthesia Evaluation  Patient identified by MRN, date of birth, ID band Patient awake    Reviewed: Allergy & Precautions, H&P , NPO status , Patient's Chart, lab work & pertinent test results  Airway Mallampati: II TM Distance: >3 FB Neck ROM: Full    Dental  (+) Teeth Intact, Dental Advisory Given   Pulmonary Current Smoker,          Cardiovascular hypertension, Rhythm:Regular Rate:Normal     Neuro/Psych negative neurological ROS  negative psych ROS   GI/Hepatic negative GI ROS, Neg liver ROS, GERD-  ,(+)     substance abuse  marijuana use,   Endo/Other  Morbid obesity (BMI 47)  Renal/GU negative Renal ROS  negative genitourinary   Musculoskeletal negative musculoskeletal ROS (+)   Abdominal   Peds  Hematology negative hematology ROS (+)   Anesthesia Other Findings   Reproductive/Obstetrics negative OB ROS                       Anesthesia Physical Anesthesia Plan  ASA: II and emergent  Anesthesia Plan: General   Post-op Pain Management:    Induction: Intravenous and Rapid sequence  Airway Management Planned: Oral ETT  Additional Equipment: None  Intra-op Plan:   Post-operative Plan: Extubation in OR  Informed Consent: I have reviewed the patients History and Physical, chart, labs and discussed the procedure including the risks, benefits and alternatives for the proposed anesthesia with the patient or authorized representative who has indicated his/her understanding and acceptance.   Dental advisory given  Plan Discussed with: CRNA and Surgeon  Anesthesia Plan Comments:         Anesthesia Quick Evaluation

## 2014-03-25 NOTE — ED Provider Notes (Signed)
Medical screening examination/treatment/procedure(s) were conducted as a shared visit with non-physician practitioner(s) and myself.  I personally evaluated the patient during the encounter.   EKG Interpretation None      38 yo female with abdominal pain. On exam, well appearing, nontoxic, not distressed, normal respiratory effort, normal perfusion, abdomen soft but TTP in epigastrium and periumbilical regions.  CT shows evidence of appendicitis. Plan surgery consult.  Clinical Impression: 1. Acute appendicitis, unspecified acute appendicitis type       Candyce Churn III, MD 03/25/14 [phone]

## 2014-03-25 NOTE — Anesthesia Procedure Notes (Signed)
Procedure Name: Intubation Date/Time: 03/25/2014 6:05 PM Performed by: Leonel Ramsay Pre-anesthesia Checklist: Patient identified, Timeout performed, Emergency Drugs available, Suction available and Patient being monitored Patient Re-evaluated:Patient Re-evaluated prior to inductionOxygen Delivery Method: Circle system utilized Preoxygenation: Pre-oxygenation with 100% oxygen Intubation Type: IV induction, Rapid sequence and Cricoid Pressure applied Laryngoscope Size: Mac and 4 Grade View: Grade I Tube type: Oral Tube size: 7.0 mm Number of attempts: 1 Airway Equipment and Method: Stylet Placement Confirmation: ETT inserted through vocal cords under direct vision,  positive ETCO2 and breath sounds checked- equal and bilateral Secured at: 22 cm Tube secured with: Tape Dental Injury: Teeth and Oropharynx as per pre-operative assessment

## 2014-03-25 NOTE — ED Notes (Signed)
Pt transported to US

## 2014-03-25 NOTE — ED Notes (Signed)
Ct notified pt done with contrast

## 2014-03-25 NOTE — ED Notes (Signed)
Pt aware that urine sample is needed.  

## 2014-03-25 NOTE — Op Note (Addendum)
OPERATIVE REPORT  DATE OF OPERATION: 03/25/2014  PATIENT:  Nicole [name]  38 y.o. female  PRE-OPERATIVE DIAGNOSIS:  Acute appendicitis  POST-OPERATIVE DIAGNOSIS:  Acute appendicitis and umbilical hernia and abdominal adhesions  PROCEDURE:  Procedure(s): APPENDECTOMY LAPAROSCOPIC Umbilical hernia repair Lysis of adhesions  SURGEON:  Surgeon(s): Cherylynn Ridges, MD  ASSISTANT: None  ANESTHESIA:   general  EBL: <20 ml  BLOOD ADMINISTERED: none  DRAINS: none   SPECIMEN:  Source of Specimen:  Appendix  COUNTS CORRECT:  YES  PROCEDURE DETAILS: The patient was taken to the operating room and placed on the table in supine position. After an adequate general endotracheal anesthetic was administered she was prepped and draped in the usual sterile manner exposing her entire abdomen.  A proper timeout was performed identifying the patient and the procedure to be performed. She was noted preoperatively that she had an infraumbilical hernia which was used in the site of entrance for the laparoscopic procedure.  An infraumbilical curvilinear incision was made using a #15 blade and was subsequently dissected off the hernia defect. Was incised at the level of the fashion which was controlled with a Kocher clamp. We resected some omentum using Kelly clamps and 3-0 Vicryl ties. We then bluntly dissected down into the peritoneal cavity with Kocher clamps on the fascia. A pursestring suture of 0 Vicryl was passed around the fascial opening. This secured in the [facility] cannula which was used to insufflate carbon dioxide gas up to a maximal intra-abdominal pressure of 15 mmHg.  Was noted upon entering the perineal cavity the patient had some adhesions to the intra-abdominal wall near the incision site of the [facility] cannula. Right upper quadrant 5 mm cannula was passed under direct vision and through that a laparoscopic scissors with attaching the cautery was used to take down the adhesions in the  midline and left low quadrant. We were then able to pass a 5 mm left low quadrant cannula into the peritoneal cavity under direct vision into the peritoneal cavity. The patient was placed in Trendelenburg and the left side was tilted down.  The appendix is moderately inflamed and thickened. It coursed toward the midline. We were able to come across the mesoappendix using a Harmonic Scalpel and dettaching it completely down to the base of the appendix at the cecum. We came across the base of the cecum with a laparoscopic Endo GIA with a blue cartridge. This completely detached appendix after the mesentery was taken with the harmonic scalpel. We retrieved the appendix  from the umbilical site using an Endo Catch bag. There was excellent hemostasis in the right low quadrant. We irrigated with saline and found there to be no acute bleeding.  Once this was completed we placed the patient flat. The pursestring suture which was in place was used to tie off the bilk her hernia and the fascia defect.  Quarter percent Marcaine with epi was injected at all sites. The umbilical skin was closed using a running subcuticular stitch of 4-0 Monocryl. All counts were correct including needles, sponges, and instruments. Tegaderm Dermabond and Steri-Strips are used to complete the dressings.  PATIENT DISPOSITION:  PACU - hemodynamically stable.   Cherylynn Ridges 8/27/20158:29 PM

## 2014-03-25 NOTE — ED Provider Notes (Signed)
Medical screening examination/treatment/procedure(s) were conducted as a shared visit with non-physician practitioner(s) and myself.  I personally evaluated the patient during the encounter.   EKG Interpretation None        Candyce Churn III, MD 03/25/14 [phone]

## 2014-03-25 NOTE — H&P (Signed)
Chief Complaint: abdominal pain  HPI:  Nicole [name] is a 38 year old female with a history of HTN, COPD, tobacco use since age 89 who presents with abdominal pain.  Duration of symptoms is 2-3 days.  Onset was gradual.  Characterized as tightness.  Severe in severity.  Coarse is worsening since this morning.  Location is in the periumbilical and epigastric region without radiation.  Associated with nausea, vomiting and anorexia.  No modifying factors.  Aggravated by food.  Alleviated by marijuana.  She reports sob at baseline.  Previous surgeries include 2 c sections.  Last oral intake was 7AM.  Reports daily marijuana use, rare alcohol use, 6-7 cigarettes per day.  Her work up shows acute appendicitis per CT of abdomen.  White count is 11.6K.  UPT is negative, UA is negative. We have therefore been asked to evaluate the patient.     Past Medical History  Diagnosis Date  . Hypertension     Past Surgical History  Procedure Laterality Date  . Cesarean section      History reviewed. No pertinent family history. Social History:  reports that she has been smoking.  She does not have any smokeless tobacco history on file. She reports that she does not drink alcohol or use illicit drugs.  Allergies: No Known Allergies  Medication none  (Not in a hospital admission)  Results for orders placed during the hospital encounter of 03/25/14 (from the past 48 hour(s))  CBC WITH DIFFERENTIAL     Status: Abnormal   Collection Time    03/25/14 11:03 AM      Result Value Ref Range   WBC 11.6 (*) 4.0 - 10.5 K/uL   RBC 4.93  3.87 - 5.11 MIL/uL   Hemoglobin 13.2  12.0 - 15.0 g/dL   HCT 41.1  36.0 - 46.0 %   MCV 83.4  78.0 - 100.0 fL   MCH 26.8  26.0 - 34.0 pg   MCHC 32.1  30.0 - 36.0 g/dL   RDW 14.0  11.5 - 15.5 %   Platelets 239  150 - 400 K/uL   Neutrophils Relative % 69  43 - 77 %   Neutro Abs 8.1 (*) 1.7 - 7.7 K/uL   Lymphocytes Relative 23  12 - 46 %   Lymphs Abs 2.6  0.7 - 4.0 K/uL    Monocytes Relative 7  3 - 12 %   Monocytes Absolute 0.8  0.1 - 1.0 K/uL   Eosinophils Relative 1  0 - 5 %   Eosinophils Absolute 0.1  0.0 - 0.7 K/uL   Basophils Relative 0  0 - 1 %   Basophils Absolute 0.0  0.0 - 0.1 K/uL  COMPREHENSIVE METABOLIC PANEL     Status: Abnormal   Collection Time    03/25/14 11:03 AM      Result Value Ref Range   Sodium 140  137 - 147 mEq/L   Potassium 4.5  3.7 - 5.3 mEq/L   Chloride 104  96 - 112 mEq/L   CO2 26  19 - 32 mEq/L   Glucose, Bld 105 (*) 70 - 99 mg/dL   BUN 9  6 - 23 mg/dL   Creatinine, Ser 0.85  0.50 - 1.10 mg/dL   Calcium 9.3  8.4 - 10.5 mg/dL   Total Protein 7.1  6.0 - 8.3 g/dL   Albumin 3.4 (*) 3.5 - 5.2 g/dL   AST 25  0 - 37 U/L   ALT 25  0 - 35 U/L   Alkaline Phosphatase 76  39 - 117 U/L   Total Bilirubin 0.3  0.3 - 1.2 mg/dL   GFR calc non Af Amer 85 (*) >90 mL/min   GFR calc Af Amer >90  >90 mL/min   Comment: (NOTE)     The eGFR has been calculated using the CKD EPI equation.     This calculation has not been validated in all clinical situations.     eGFR's persistently <90 mL/min signify possible Chronic Kidney     Disease.   Anion gap 10  5 - 15  LIPASE, BLOOD     Status: None   Collection Time    03/25/14 11:03 AM      Result Value Ref Range   Lipase 27  11 - 59 U/L  URINALYSIS, ROUTINE W REFLEX MICROSCOPIC     Status: Abnormal   Collection Time    03/25/14  1:26 PM      Result Value Ref Range   Color, Urine YELLOW  YELLOW   APPearance CLOUDY (*) CLEAR   Specific Gravity, Urine 1.018  1.005 - 1.030   pH 5.0  5.0 - 8.0   Glucose, UA NEGATIVE  NEGATIVE mg/dL   Hgb urine dipstick NEGATIVE  NEGATIVE   Bilirubin Urine NEGATIVE  NEGATIVE   Ketones, ur NEGATIVE  NEGATIVE mg/dL   Protein, ur NEGATIVE  NEGATIVE mg/dL   Urobilinogen, UA 0.2  0.0 - 1.0 mg/dL   Nitrite NEGATIVE  NEGATIVE   Leukocytes, UA TRACE (*) NEGATIVE  PREGNANCY, URINE     Status: None   Collection Time    03/25/14  1:26 PM      Result Value Ref Range    Preg Test, Ur NEGATIVE  NEGATIVE   Comment:            THE SENSITIVITY OF THIS     METHODOLOGY IS >20 mIU/mL.  URINE MICROSCOPIC-ADD ON     Status: Abnormal   Collection Time    03/25/14  1:26 PM      Result Value Ref Range   Squamous Epithelial / LPF FEW (*) RARE   WBC, UA 0-2  <3 WBC/hpf   RBC / HPF 0-2  <3 RBC/hpf   Bacteria, UA RARE  RARE   US Abdomen Complete  03/25/2014   CLINICAL DATA:  Upper abdominal pain.  EXAM: ULTRASOUND ABDOMEN COMPLETE  COMPARISON:  None.  FINDINGS: Gallbladder:  No gallstones or wall thickening visualized. No sonographic Murphy sign noted.  Common bile duct:  Diameter: 0.5 cm  Liver:  No focal lesion identified. Within normal limits in parenchymal echogenicity.  IVC:  No abnormality visualized.  Pancreas:  Visualized portion unremarkable.  Spleen:  Size and appearance within normal limits.  Right Kidney:  Length: 14.4 cm. Echogenicity within normal limits. No mass or hydronephrosis visualized.  Left Kidney:  Length: 15.1 cm. Echogenicity within normal limits. No mass or hydronephrosis visualized.  Abdominal aorta:  No aneurysm visualized.  Other findings:  None.  IMPRESSION: Mild left hydronephrosis. Cause for the finding is not identified. CT abdomen and pelvis could be used for further evaluation.  Negative for gallstones.   Electronically Signed   By: Inge Rise M.D.   On: 03/25/2014 13:34   Ct Abdomen Pelvis W Contrast  03/25/2014   CLINICAL DATA:  Abdominal pain ; mild left-sided hydronephrosis  EXAM: CT ABDOMEN AND PELVIS WITH CONTRAST  TECHNIQUE: Multidetector CT imaging of the abdomen and pelvis was performed using  the standard protocol following bolus administration of intravenous contrast.  CONTRAST:  3m OMNIPAQUE IOHEXOL 300 MG/ML  SOLN  COMPARISON:  Abdominal ultrasound March 25, 2014  FINDINGS: Lung bases are clear.  There is a small hiatal hernia.  Liver is prominent measuring 19.9 cm in length. No focal liver lesions are identified.  Gallbladder wall is not thickened. There is no appreciable biliary duct dilatation.  Spleen, pancreas, and adrenals appear within normal limits.  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 No renal masses are identified on either side. There is a new mildly prominent extrarenal pelvis on each side. There is currently no appreciable hydronephrosis. There is no renal or ureteral calculus on either side. There is a circumaortic left renal vein, an anatomic variant.  In the pelvis, the urinary bladder is midline with wall thickness within normal limits. There is a fat containing mass in the left ovary measuring 1.8 x 1.4 cm consistent with a small dermoid. There is a dominant follicle in the right ovary measuring 2.4 by 2.1 cm. No other pelvic masses are appreciable.  Inflammation surrounding the distal aspect of the appendix. The distal appendix has an ill-defined wall with appendiceal thickening to 1.2 cm in this portion of the appendix. These findings are felt to represent a degree of acute appendicitis involving the distal aspect of the appendix. This appearance is best seen on axial slice 53 series 2.  There is a focal ventral hernia containing only fat.  There is no bowel obstruction. No free air or portal venous air. There are scattered diverticula throughout the colon without diverticulitis.  There is no ascites, adenopathy, or abscess in the abdomen or pelvis. There is no appreciable abdominal aortic aneurysm. There are no blastic or lytic bone lesions.  IMPRESSION: Evidence of acute appendicitis involving the distal appendix. No abscess.  Scattered diverticula throughout the colon without diverticulitis.  Ventral hernia containing only fat. There is also a small hiatal hernia.  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  No appreciable hydronephrosis. No renal or ureteral calculus on either side. Right kidney mildly malrotated.  Liver enlarged without focal lesions seen.  Critical Value/emergent results were  called by telephone at the time of interpretation on 03/25/2014 at 3:39 pm to Dr. CDalia Heading, who verbally acknowledged these results.   Electronically Signed   By: WLowella GripM.D.   On: 03/25/2014 15:39    Review of Systems  All other systems reviewed and are negative.   Blood pressure 140/82, pulse 55, temperature 98.1 F (36.7 C), temperature source Oral, resp. rate 17, height 5' 7"  (1.702 m), weight 300 lb (136.079 kg), last menstrual period 03/09/2014, SpO2 100.00%. Physical Exam  Constitutional: She is oriented to person, place, and time. She appears well-developed and well-nourished. No distress.  HENT:  Head: Normocephalic and atraumatic.  Neck: Normal range of motion. Neck supple.  Cardiovascular: Normal rate, regular rhythm, normal heart sounds and intact distal pulses.  Exam reveals no gallop and no friction rub.   No murmur heard. Respiratory: Effort normal and breath sounds normal.  GI: Soft. Bowel sounds are normal.  Easily reducible umbilical hernia.  TTP to the periumbilical region.  No pain to the RLQ.   Musculoskeletal: Normal range of motion. She exhibits no edema and no tenderness.  Neurological: She is alert and oriented to person, place, and time.  Skin: Skin is warm and dry. No rash noted. She is not diaphoretic. No erythema. No pallor.  Psychiatric: She has a  normal mood and affect. Her behavior is normal. Judgment and thought content normal.     Assessment/Plan Acute appendicitis -will proceed with a laparoscopic appendectomy. -keep NPO -IVF -rocephin/flagyl -obtain consent, risks of the surgery were discussed including but not limited to bleeding, infection, injury to surrounding structures.   -SCD, post op lovenox   Malikye Reppond ANP-BC 03/25/2014, 4:23 PM

## 2014-03-25 NOTE — ED Notes (Signed)
PT is nauseated; PA aware. Contrast at bedside.

## 2014-03-25 NOTE — Anesthesia Postprocedure Evaluation (Signed)
Anesthesia Post Note  Patient: Nicole [name]  Procedure(s) Performed: Procedure(s) (LRB): APPENDECTOMY LAPAROSCOPIC (N/A)  Anesthesia type: general  Patient location: PACU  Post pain: Pain level controlled  Post assessment: Patient's Cardiovascular Status Stable  Last Vitals:  Filed Vitals:   03/25/14 2019  BP: 145/64  Pulse: 62  Temp: 36.7 C  Resp: 18    Post vital signs: Reviewed and stable  Level of consciousness: sedated  Complications: No apparent anesthesia complications

## 2014-03-25 NOTE — ED Notes (Signed)
PT reports she has diffuse abdominal pain but most painful in epigastric area. States she has nauseated and dry heaving. Denies vomiting or diarr. Soft stools

## 2014-03-25 NOTE — H&P (Signed)
Periumbilical tenderness and a medial traveling appendix.  For laparoscopic appendectomy tonight as soon as possible so that the patient does not rupture.  Marta Lamas. Gae Bon, MD, FACS [phone] [facility]

## 2014-03-25 NOTE — ED Notes (Signed)
Patient transported to CT 

## 2014-03-26 ENCOUNTER — Encounter (HOSPITAL_COMMUNITY): Payer: Self-pay | Admitting: General Surgery

## 2014-03-26 LAB — URINE CULTURE: Colony Count: 50000

## 2014-03-26 MED ORDER — PNEUMOCOCCAL VAC POLYVALENT 25 MCG/0.5ML IJ INJ: 0.5000 mL | INJECTION | INTRAMUSCULAR | Status: DC

## 2014-03-26 MED ORDER — OXYCODONE-ACETAMINOPHEN 5-325 MG PO TABS: 1.0000 | ORAL_TABLET | Freq: Four times a day (QID) | ORAL | Status: DC | PRN

## 2014-03-26 MED ORDER — OXYCODONE-ACETAMINOPHEN 5-325 MG PO TABS
1.0000 | ORAL_TABLET | ORAL | Status: DC | PRN
  Administered 2014-03-26: 2 via ORAL
  Filled 2014-03-26: qty 2

## 2014-03-26 MED ORDER — WHITE PETROLATUM GEL
Status: AC
  Administered 2014-03-26: 0.2
  Filled 2014-03-26: qty 5

## 2014-03-26 NOTE — Discharge Summary (Signed)
Brigitte Soderberg, MD, MPH, FACS Trauma: [phone] General Surgery: [phone]  

## 2014-03-26 NOTE — Progress Notes (Signed)
Discharge home. Home discharge instruction given, no questions verbalized. 

## 2014-03-26 NOTE — Discharge Instructions (Signed)

## 2014-03-26 NOTE — Discharge Summary (Signed)
Physician Discharge Summary  Patient ID: Nicole [name] MRN: [id number] DOB/AGE: Apr 17, 1976 38 y.o.  Admit date: 03/25/2014 Discharge date: 03/26/2014  Admitting Diagnosis: Acute appendicitis   Discharge Diagnosis Patient Active Problem List   Diagnosis Date Noted  . Acute appendicitis 03/25/2014  . Hypertension 03/25/2014    Consultants none  Imaging: US Abdomen Complete  03/25/2014   CLINICAL DATA:  Upper abdominal pain.  EXAM: ULTRASOUND ABDOMEN COMPLETE  COMPARISON:  None.  FINDINGS: Gallbladder:  No gallstones or wall thickening visualized. No sonographic Murphy sign noted.  Common bile duct:  Diameter: 0.5 cm  Liver:  No focal lesion identified. Within normal limits in parenchymal echogenicity.  IVC:  No abnormality visualized.  Pancreas:  Visualized portion unremarkable.  Spleen:  Size and appearance within normal limits.  Right Kidney:  Length: 14.4 cm. Echogenicity within normal limits. No mass or hydronephrosis visualized.  Left Kidney:  Length: 15.1 cm. Echogenicity within normal limits. No mass or hydronephrosis visualized.  Abdominal aorta:  No aneurysm visualized.  Other findings:  None.  IMPRESSION: Mild left hydronephrosis. Cause for the finding is not identified. CT abdomen and pelvis could be used for further evaluation.  Negative for gallstones.   Electronically Signed   By: Drusilla Kanner M.D.   On: 03/25/2014 13:34   Ct Abdomen Pelvis W Contrast  03/25/2014   CLINICAL DATA:  Abdominal pain ; mild left-sided hydronephrosis  EXAM: CT ABDOMEN AND PELVIS WITH CONTRAST  TECHNIQUE: Multidetector CT imaging of the abdomen and pelvis was performed using the standard protocol following bolus administration of intravenous contrast.  CONTRAST:  80mL OMNIPAQUE IOHEXOL 300 MG/ML  SOLN  COMPARISON:  Abdominal ultrasound March 25, 2014  FINDINGS: Lung bases are clear.  There is a small hiatal hernia.  Liver is prominent measuring 19.9 cm in length. No focal liver lesions are  identified. Gallbladder wall is not thickened. There is no appreciable biliary duct dilatation.  Spleen, pancreas, and adrenals appear within normal limits.  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 No renal masses are identified on either side. There is a new mildly prominent extrarenal pelvis on each side. There is currently no appreciable hydronephrosis. There is no renal or ureteral calculus on either side. There is a circumaortic left renal vein, an anatomic variant.  In the pelvis, the urinary bladder is midline with wall thickness within normal limits. There is a fat containing mass in the left ovary measuring 1.8 x 1.4 cm consistent with a small dermoid. There is a dominant follicle in the right ovary measuring 2.4 by 2.1 cm. No other pelvic masses are appreciable.  Inflammation surrounding the distal aspect of the appendix. The distal appendix has an ill-defined wall with appendiceal thickening to 1.2 cm in this portion of the appendix. These findings are felt to represent a degree of acute appendicitis involving the distal aspect of the appendix. This appearance is best seen on axial slice 53 series 2.  There is a focal ventral hernia containing only fat.  There is no bowel obstruction. No free air or portal venous air. There are scattered diverticula throughout the colon without diverticulitis.  There is no ascites, adenopathy, or abscess in the abdomen or pelvis. There is no appreciable abdominal aortic aneurysm. There are no blastic or lytic bone lesions.  IMPRESSION: Evidence of acute appendicitis involving the distal appendix. No abscess.  Scattered diverticula throughout the colon without diverticulitis.  Ventral hernia containing only fat. There is also a small hiatal hernia.  Small  dermoid in the left ovary.  No appreciable hydronephrosis. No renal or ureteral calculus on either side. Right kidney mildly malrotated.  Liver enlarged without focal lesions seen.  Critical Value/emergent  results were called by telephone at the time of interpretation on 03/25/2014 at 3:39 pm to Dr. Charlestine Night , who verbally acknowledged these results.   Electronically Signed   By: Bretta Bang M.D.   On: 03/25/2014 15:39    Procedures Laparoscopic appendectomy---Dr. Lindie Spruce 03/25/14  Hospital Course:  Nevin Kozuch is a 38 year old female with a history of HTN, COPD, tobacco use since age 19 who presented with abdominal pain. Her work up showed acute appendicitis per CT of abdomen. White count is 11.6K. UPT is negative, UA is negative.  Patient was admitted and underwent procedure listed above.  Tolerated procedure well and was transferred to the floor.  Diet was advanced as tolerated.  On POD#1, the patient was voiding well, tolerating diet, ambulating well, pain well controlled, vital signs stable, incisions c/d/i and felt stable for discharge home.  Patient will follow up in our office in 2 weeks and knows to call with questions or concerns.  Physical Exam: General:  Alert, NAD, pleasant, comfortable Abd:  Soft, ND, mild tenderness, incisions C/D/I    Medication List         oxyCODONE-acetaminophen 5-325 MG per tablet  Commonly known as:  PERCOCET/ROXICET  Take 1-2 tablets by mouth every 6 (six) hours as needed for moderate pain or severe pain.             Follow-up Information   Follow up with [facility] On 04/20/2014. (arrive by 3PM for a 3:30PM appt for a post op check)    Contact information:   [address]   [place] [address] [phone]       Signed: Ashok Norris, [facility] [phone]  03/26/2014, 7:52 AM

## 2014-04-20 ENCOUNTER — Encounter (INDEPENDENT_AMBULATORY_CARE_PROVIDER_SITE_OTHER): Payer: Self-pay

## 2015-07-26 ENCOUNTER — Emergency Department (HOSPITAL_COMMUNITY)
Admission: EM | Admit: 2015-07-26 | Discharge: 2015-07-26 | Discharge status: Against Medical Advice | Payer: Medicaid Other | Attending: Emergency Medicine | Admitting: Emergency Medicine

## 2015-07-26 ENCOUNTER — Encounter (HOSPITAL_COMMUNITY): Payer: Self-pay | Admitting: *Deleted

## 2015-07-26 DIAGNOSIS — F17200 Nicotine dependence, unspecified, uncomplicated: Secondary | ICD-10-CM | POA: Diagnosis not present

## 2015-07-26 DIAGNOSIS — R51 Headache: Secondary | ICD-10-CM | POA: Diagnosis not present

## 2015-07-26 DIAGNOSIS — R109 Unspecified abdominal pain: Secondary | ICD-10-CM | POA: Insufficient documentation

## 2015-07-26 DIAGNOSIS — I10 Essential (primary) hypertension: Secondary | ICD-10-CM | POA: Diagnosis not present

## 2015-07-26 DIAGNOSIS — R55 Syncope and collapse: Secondary | ICD-10-CM | POA: Diagnosis not present

## 2015-07-26 LAB — URINALYSIS, ROUTINE W REFLEX MICROSCOPIC
BILIRUBIN URINE: NEGATIVE
GLUCOSE, UA: NEGATIVE mg/dL
Hgb urine dipstick: NEGATIVE
Ketones, ur: NEGATIVE mg/dL
Leukocytes, UA: NEGATIVE
Nitrite: NEGATIVE
Protein, ur: NEGATIVE mg/dL
Specific Gravity, Urine: 1.028 (ref 1.005–1.030)
pH: 5.5 (ref 5.0–8.0)

## 2015-07-26 LAB — CBC
HCT: 40.2 % (ref 36.0–46.0)
Hemoglobin: 12.8 g/dL (ref 12.0–15.0)
MCH: 26.9 pg (ref 26.0–34.0)
MCHC: 31.8 g/dL (ref 30.0–36.0)
MCV: 84.5 fL (ref 78.0–100.0)
PLATELETS: 233 10*3/uL (ref 150–400)
RBC: 4.76 MIL/uL (ref 3.87–5.11)
RDW: 13.9 % (ref 11.5–15.5)
WBC: 10.9 10*3/uL — ABNORMAL HIGH (ref 4.0–10.5)

## 2015-07-26 LAB — COMPREHENSIVE METABOLIC PANEL
ALBUMIN: 3.4 g/dL — AB (ref 3.5–5.0)
ALK PHOS: 68 U/L (ref 38–126)
ALT: 23 U/L (ref 14–54)
AST: 25 U/L (ref 15–41)
Anion gap: 9 (ref 5–15)
BUN: 14 mg/dL (ref 6–20)
CHLORIDE: 105 mmol/L (ref 101–111)
CO2: 25 mmol/L (ref 22–32)
Calcium: 9.1 mg/dL (ref 8.9–10.3)
Creatinine, Ser: 0.9 mg/dL (ref 0.44–1.00)
GFR calc Af Amer: >60 mL/min (ref >60)
GFR calc non Af Amer: >60 mL/min (ref >60)
GLUCOSE: 104 mg/dL — AB (ref 65–99)
Potassium: 4 mmol/L (ref 3.5–5.1)
Sodium: 139 mmol/L (ref 135–145)
Total Bilirubin: 0.2 mg/dL — ABNORMAL LOW (ref 0.3–1.2)
Total Protein: 6.1 g/dL — ABNORMAL LOW (ref 6.5–8.1)

## 2015-07-26 LAB — POC URINE PREG, ED: Preg Test, Ur: NEGATIVE

## 2015-07-26 LAB — LIPASE, BLOOD: LIPASE: 31 U/L (ref 11–51)

## 2015-07-26 NOTE — ED Notes (Signed)
C.o abd pain headache also

## 2015-07-26 NOTE — ED Notes (Signed)
The pt has been dizzy for the poast 9 days when she started her period  That lasted one day.  She fainted in school today.  lmp  Last month

## 2015-07-26 NOTE — ED Notes (Signed)
Pt called x 2 with no answer  

## 2015-11-06 ENCOUNTER — Encounter (HOSPITAL_COMMUNITY): Payer: Self-pay | Admitting: Family Medicine

## 2015-11-06 ENCOUNTER — Emergency Department (HOSPITAL_COMMUNITY)
Admission: EM | Admit: 2015-11-06 | Discharge: 2015-11-06 | Disposition: A | Discharge status: Home / Self Care | Payer: Medicaid Other | Attending: Emergency Medicine | Admitting: Emergency Medicine

## 2015-11-06 DIAGNOSIS — J029 Acute pharyngitis, unspecified: Secondary | ICD-10-CM | POA: Insufficient documentation

## 2015-11-06 DIAGNOSIS — R630 Anorexia: Secondary | ICD-10-CM | POA: Diagnosis not present

## 2015-11-06 DIAGNOSIS — I10 Essential (primary) hypertension: Secondary | ICD-10-CM | POA: Diagnosis not present

## 2015-11-06 DIAGNOSIS — B9789 Other viral agents as the cause of diseases classified elsewhere: Secondary | ICD-10-CM

## 2015-11-06 DIAGNOSIS — J028 Acute pharyngitis due to other specified organisms: Secondary | ICD-10-CM

## 2015-11-06 LAB — RAPID STREP SCREEN (MED CTR MEBANE ONLY): Streptococcus, Group A Screen (Direct): NEGATIVE

## 2015-11-06 MED ORDER — PREDNISONE 20 MG PO TABS: 40.0000 mg | ORAL_TABLET | Freq: Every day | ORAL | Status: DC

## 2015-11-06 MED ORDER — BUTAMBEN-TETRACAINE-BENZOCAINE 2-2-14 % EX AERO: 1.0000 | INHALATION_SPRAY | CUTANEOUS | Status: DC | PRN

## 2015-11-06 MED ORDER — HYDROCODONE-ACETAMINOPHEN 7.5-325 MG/15ML PO SOLN: 15.0000 mL | Freq: Three times a day (TID) | ORAL | Status: DC | PRN

## 2015-11-06 NOTE — Discharge Instructions (Signed)

## 2015-11-06 NOTE — ED Notes (Signed)
PA at bedside.

## 2015-11-06 NOTE — ED Provider Notes (Signed)
CSN: [id number]     Arrival date & time 11/06/15  1123 History  By signing my name below, I, Nicole [name], attest that this documentation has been prepared under the direction and in the presence of Arthor Captain, PA-C Electronically Signed: Soijett [name], ED Scribe. 11/06/2015. 1:27 PM.  Chief Complaint  Patient presents with  . Sore Throat      The history is provided by the patient. No language interpreter was used.    HPI Comments: Nicole [name] is a 40 y.o. female who presents to the Emergency Department complaining of sore throat onset 3 days. Pt denies these symptoms occurring in the past. Pt denies sick contacts at this time. Pt notes that she was recently told that she had issues with her thyroid, she is unsure if it was hyper or hypo. Pt denies being placed on any medications for her thyroid issues. She states that she is having associated symptoms of painful swallowing, subjective fever, chills, appetite change due to sore throat, voice change, and HA. Pt notes that she has been more frequent headaches as of lately. She states that she has tried OTC 800 mg ibuprofen at night for the relief for her symptoms. She denies trouble swallowing, neck swelling, and any other symptoms.   Past Medical History  Diagnosis Date  . Hypertension    Past Surgical History  Procedure Laterality Date  . Cesarean section    . Laparoscopic appendectomy N/A 03/25/2014    Procedure: APPENDECTOMY LAPAROSCOPIC;  Surgeon: Cherylynn Ridges, MD;  Location: [facility] OR;  Service: General;  Laterality: N/A;   History reviewed. No pertinent family history. Social History  Substance Use Topics  . Smoking status: Current Every Day Smoker -- 0.50 packs/day  . Smokeless tobacco: None  . Alcohol Use: No   OB History    No data available     Review of Systems  Constitutional: Positive for fever (subjective), chills and appetite change.  HENT: Positive for sore throat and voice change. Negative for trouble  swallowing.        Painful swallowing  Musculoskeletal:       No neck swelling  All other systems reviewed and are negative.     Allergies  Review of patient's allergies indicates no known allergies.  Home Medications   Prior to Admission medications   Medication Sig Start Date End Date Taking? Authorizing Provider  oxyCODONE-acetaminophen (PERCOCET/ROXICET) 5-325 MG per tablet Take 1-2 tablets by mouth every 6 (six) hours as needed for moderate pain or severe pain. 03/26/14   Emina Riebock, NP   BP 138/77 mmHg  Pulse 65  Temp(Src) 98.1 F (36.7 C)  Resp 18  SpO2 97%  LMP 11/05/2015 Physical Exam  Constitutional: She is oriented to person, place, and time. She appears well-developed and well-nourished. No distress.  HENT:  Head: Normocephalic and atraumatic.  Mouth/Throat: Uvula is midline and mucous membranes are normal. No trismus in the jaw. No uvula swelling. Posterior oropharyngeal erythema present. No oropharyngeal exudate.  Eyes: EOM are normal.  Neck: Neck supple.  Cardiovascular: Normal rate.   Pulmonary/Chest: Effort normal. No respiratory distress.  Abdominal: She exhibits no distension.  Musculoskeletal: Normal range of motion.  Lymphadenopathy:       Head (right side): Tonsillar adenopathy present.       Head (left side): Tonsillar adenopathy present.  Neurological: She is alert and oriented to person, place, and time.  Skin: Skin is warm and dry.  Psychiatric: She has a normal mood and  affect. Her behavior is normal.  Nursing note and vitals reviewed.   ED Course  Procedures (including critical care time) DIAGNOSTIC STUDIES: Oxygen Saturation is 97% on RA, nl by my interpretation.    COORDINATION OF CARE: 1:27 PM Discussed treatment plan with pt at bedside which includes rapid strep screen and culture, anti-inflammatory, and pain medications, and pt agreed to plan.    Labs Review Labs Reviewed  RAPID STREP SCREEN (NOT AT [facility])  CULTURE, GROUP A  STREP [facility])    Imaging Review No results found. I have personally reviewed and evaluated these lab results as part of my medical decision-making.   EKG Interpretation None      MDM   Final diagnoses:  Acute viral pharyngitis   Pt with negative strep. Diagnosis of viral pharyngitis. No abx indicated at this time. Discussed that results of strep culture are pending and patient will be informed if positive result and abx will be called in at that time. Discharge with symptomatic tx. No evidence of dehydration. Pt is tolerating secretions. Presentation not concerning for peritonsillar abscess or spread of infection to deep spaces of the throat; patent airway. Specific return precautions discussed. Recommended PCP follow up. Pt appears safe for discharge.   I personally performed the services described in this documentation, which was scribed in my presence. The recorded information has been reviewed and is accurate.      Arthor CaptainAbigail Dynesha Woolen, PA-C 11/06/15 1342  Rolan BuccoMelanie Belfi, MD 11/06/15 [phone]

## 2015-11-06 NOTE — ED Notes (Signed)
Pt here for sore throat. sts hurts to swallow and drink any fluids.

## 2015-11-08 LAB — CULTURE, GROUP A STREP (THRC)

## 2016-02-10 ENCOUNTER — Emergency Department (HOSPITAL_COMMUNITY): Payer: Medicaid Other

## 2016-02-10 ENCOUNTER — Encounter (HOSPITAL_COMMUNITY): Payer: Self-pay

## 2016-02-10 ENCOUNTER — Emergency Department (HOSPITAL_COMMUNITY)
Admission: EM | Admit: 2016-02-10 | Discharge: 2016-02-10 | Disposition: A | Discharge status: Home / Self Care | Payer: Medicaid Other | Attending: Emergency Medicine | Admitting: Emergency Medicine

## 2016-02-10 DIAGNOSIS — I10 Essential (primary) hypertension: Secondary | ICD-10-CM | POA: Diagnosis not present

## 2016-02-10 DIAGNOSIS — I319 Disease of pericardium, unspecified: Secondary | ICD-10-CM

## 2016-02-10 DIAGNOSIS — R079 Chest pain, unspecified: Secondary | ICD-10-CM

## 2016-02-10 DIAGNOSIS — R0602 Shortness of breath: Secondary | ICD-10-CM | POA: Diagnosis not present

## 2016-02-10 DIAGNOSIS — F17200 Nicotine dependence, unspecified, uncomplicated: Secondary | ICD-10-CM | POA: Insufficient documentation

## 2016-02-10 DIAGNOSIS — Z79899 Other long term (current) drug therapy: Secondary | ICD-10-CM | POA: Diagnosis not present

## 2016-02-10 DIAGNOSIS — R600 Localized edema: Secondary | ICD-10-CM | POA: Diagnosis not present

## 2016-02-10 LAB — I-STAT TROPONIN, ED
Troponin i, poc: 0 ng/mL (ref 0.00–0.08)
Troponin i, poc: 0.01 ng/mL (ref 0.00–0.08)

## 2016-02-10 LAB — CBC
HEMATOCRIT: 40.5 % (ref 36.0–46.0)
Hemoglobin: 13 g/dL (ref 12.0–15.0)
MCH: 26.9 pg (ref 26.0–34.0)
MCHC: 32.1 g/dL (ref 30.0–36.0)
MCV: 83.7 fL (ref 78.0–100.0)
PLATELETS: 233 10*3/uL (ref 150–400)
RBC: 4.84 MIL/uL (ref 3.87–5.11)
RDW: 13.9 % (ref 11.5–15.5)
WBC: 8.3 10*3/uL (ref 4.0–10.5)

## 2016-02-10 LAB — URINALYSIS, ROUTINE W REFLEX MICROSCOPIC
Bilirubin Urine: NEGATIVE
Glucose, UA: NEGATIVE mg/dL
Hgb urine dipstick: NEGATIVE
Ketones, ur: NEGATIVE mg/dL
Leukocytes, UA: NEGATIVE
Nitrite: NEGATIVE
Protein, ur: NEGATIVE mg/dL
Specific Gravity, Urine: 1.02 (ref 1.005–1.030)
pH: 6 (ref 5.0–8.0)

## 2016-02-10 LAB — BASIC METABOLIC PANEL
Anion gap: 6 (ref 5–15)
BUN: 10 mg/dL (ref 6–20)
CHLORIDE: 108 mmol/L (ref 101–111)
CO2: 26 mmol/L (ref 22–32)
CREATININE: 0.88 mg/dL (ref 0.44–1.00)
Calcium: 9 mg/dL (ref 8.9–10.3)
GFR calc Af Amer: >60 mL/min (ref >60)
GFR calc non Af Amer: >60 mL/min (ref >60)
Glucose, Bld: 120 mg/dL — ABNORMAL HIGH (ref 65–99)
POTASSIUM: 4.1 mmol/L (ref 3.5–5.1)
Sodium: 140 mmol/L (ref 135–145)

## 2016-02-10 LAB — BRAIN NATRIURETIC PEPTIDE: B Natriuretic Peptide: 81.2 pg/mL (ref 0.0–100.0)

## 2016-02-10 LAB — PREGNANCY, URINE: Preg Test, Ur: NEGATIVE

## 2016-02-10 LAB — D-DIMER, QUANTITATIVE (NOT AT ARMC): D-Dimer, Quant: 0.73 ug/mL-FEU — ABNORMAL HIGH (ref 0.00–0.50)

## 2016-02-10 MED ORDER — IBUPROFEN 800 MG PO TABS: 800.0000 mg | ORAL_TABLET | Freq: Three times a day (TID) | ORAL | Status: DC

## 2016-02-10 MED ORDER — IOPAMIDOL (ISOVUE-370) INJECTION 76%: 100.0000 mL | Freq: Once | INTRAVENOUS | Status: DC | PRN

## 2016-02-10 MED ORDER — IBUPROFEN 800 MG PO TABS
800.0000 mg | ORAL_TABLET | Freq: Once | ORAL | Status: AC
  Administered 2016-02-10: 800 mg via ORAL
  Filled 2016-02-10: qty 1

## 2016-02-10 NOTE — ED Provider Notes (Signed)
CSN: [id number]     Arrival date & time 02/10/16  1134 History   First MD Initiated Contact with Patient 02/10/16 1411     Chief Complaint  Patient presents with  . Chest Pain     (Consider location/radiation/quality/duration/timing/severity/associated sxs/prior Treatment) HPI Comments: Patient is a 40 year old female who presents with a one-day history of chest heaviness with associated sharp pleuritic pains. Patient reports her chest heaviness began suddenly all the patient was at work standing doing someone's hair. Patient also reports associated shortness of breath at rest and exertion for many months to a year. Patient also reports peripheral edema in her bilateral legs feet for the past 6 months to a year and bilateral hands that began yesterday. Patient denies pain in her legs. Patient also reports urinary frequency for 6 months to years as well. Patient has not seen her primary care provider about any of these problems. Patient denies any abdominal pain, nausea, vomiting, dysuria. Patient denies any recent surgeries, cancer, recent long trips, exogenous estrogen use. Patient does smoke 4-5 cigarettes per day. Per medical record, patient presented with similar pain 4 years ago and had a negative chest CT. However, there is no record of the associated symptoms listed above.  Patient is a 40 y.o. female presenting with chest pain. The history is provided by the patient and medical records.  Chest Pain Associated symptoms: shortness of breath   Associated symptoms: no abdominal pain, no back pain, no fever, no headache, no nausea and not vomiting     Past Medical History  Diagnosis Date  . Hypertension    Past Surgical History  Procedure Laterality Date  . Cesarean section    . Laparoscopic appendectomy N/A 03/25/2014    Procedure: APPENDECTOMY LAPAROSCOPIC;  Surgeon: Cherylynn RidgesJames O Wyatt, MD;  [facility];  Service: General;  Laterality: N/A;   No family history on file. Social History    Substance Use Topics  . Smoking status: Current Every Day Smoker -- 0.50 packs/day  . Smokeless tobacco: None  . Alcohol Use: No   OB History    No data available     Review of Systems  Constitutional: Negative for fever and chills.  HENT: Negative for facial swelling and sore throat.   Respiratory: Positive for shortness of breath.   Cardiovascular: Positive for chest pain and leg swelling.  Gastrointestinal: Negative for nausea, vomiting and abdominal pain.  Genitourinary: Negative for dysuria.  Musculoskeletal: Negative for back pain.  Skin: Negative for rash and wound.  Neurological: Negative for headaches.  Psychiatric/Behavioral: The patient is not nervous/anxious.       Allergies  Review of patient's allergies indicates no known allergies.  Home Medications   Prior to Admission medications   Medication Sig Start Date End Date Taking? Authorizing Provider  butamben-tetracaine-benzocaine (CETACAINE) 08-31-12 % spray Apply 1 spray topically as needed for pain. 11/06/15   Arthor CaptainAbigail Harris, PA-C  HYDROcodone-acetaminophen (HYCET) 7.5-325 mg/15 ml solution Take 15 mLs by mouth every 8 (eight) hours as needed for moderate pain. 11/06/15   Arthor CaptainAbigail Harris, PA-C  oxyCODONE-acetaminophen (PERCOCET/ROXICET) 5-325 MG per tablet Take 1-2 tablets by mouth every 6 (six) hours as needed for moderate pain or severe pain. 03/26/14   Emina Riebock, NP  predniSONE (DELTASONE) 20 MG tablet Take 2 tablets (40 mg total) by mouth daily. 11/06/15   Abigail Harris, PA-C   BP 125/84 mmHg  Pulse 63  Temp(Src) 98.5 F (36.9 C) (Oral)  Resp 16  Ht 5\' 6"  (1.676 m)  Wt 147.419 kg  BMI 52.48 kg/m2  SpO2 97%  LMP 01/14/2016 Physical Exam  Constitutional: She appears well-developed and well-nourished. No distress.  HENT:  Head: Normocephalic and atraumatic.  Mouth/Throat: Oropharynx is clear and moist. No oropharyngeal exudate.  Eyes: Conjunctivae are normal. Pupils are equal, round, and reactive to  light. Right eye exhibits no discharge. Left eye exhibits no discharge. No scleral icterus.  Neck: Normal range of motion. Neck supple. No thyromegaly present.  Cardiovascular: Normal rate, regular rhythm, normal heart sounds and intact distal pulses.  Exam reveals no gallop and no friction rub.   No murmur heard. Pulmonary/Chest: Effort normal and breath sounds normal. No stridor. No respiratory distress. She has no wheezes. She has no rales. She exhibits tenderness.    Abdominal: Soft. Bowel sounds are normal. She exhibits no distension. There is no tenderness. There is no rebound and no guarding.  Musculoskeletal: She exhibits edema (bilateral lower extremities and bilateral hands).       Feet:  TTP over dorsal L foot (patient denies any known injury except 6-7 years ago when she put her foot out to catch her niece who is falling), distal pulses intact, edema symmetrical to right foot, no excessive warmth noted; no calf tenderness  Lymphadenopathy:    She has no cervical adenopathy.  Neurological: She is alert. Coordination normal.  Skin: Skin is warm and dry. No rash noted. She is not diaphoretic. No pallor.  Psychiatric: She has a normal mood and affect.  Nursing note and vitals reviewed.   ED Course  Procedures (including critical care time) Labs Review Labs Reviewed  BASIC METABOLIC PANEL - Abnormal; Notable for the following:    Glucose, Bld 120 (*)    All other components within normal limits  D-DIMER, QUANTITATIVE (NOT AT [facility]) - Abnormal; Notable for the following:    D-Dimer, Quant 0.73 (*)    All other components within normal limits  CBC  BRAIN NATRIURETIC PEPTIDE  URINALYSIS, ROUTINE W REFLEX MICROSCOPIC (NOT AT [facility])  PREGNANCY, URINE  I-STAT TROPOININ, ED    Imaging Review Dg Chest 2 View  02/10/2016  CLINICAL DATA:  Shortness of breath for for 1 day EXAM: CHEST  2 VIEW COMPARISON:  02/15/2012 FINDINGS: Borderline cardiomegaly. No acute infiltrate or pleural  effusion. No pulmonary edema. Mild degenerative changes thoracic spine. IMPRESSION: No active cardiopulmonary disease. Electronically Signed   By: Natasha Mead M.D.   On: 02/10/2016 12:37   I have personally reviewed and evaluated these images and lab results as part of my medical decision-making.   EKG Interpretation   Date/Time:  Friday February 10 2016 11:40:46 EDT Ventricular Rate:  85 PR Interval:  154 QRS Duration: 90 QT Interval:  370 QTC Calculation: 440 R Axis:   38 Text Interpretation:  Normal sinus rhythm Normal ECG No acute changes  Nonspecific ST and T wave abnormality No significant change since last  tracing Confirmed by NANAVATI, MD, ANKIT ([id number]) on 02/10/2016 2:53:31 PM      1410 On reevaluation after ibuprofen, patient states she is no longer having chest pain.  MDM   EKG shows NSR and nonspecific ST and T-wave abnormality. CBC, BMP unremarkable. Initial troponin 0.00. BNP, d-dimer pending. UA and urine pregnant pending. CXR shows no active cardiopulmonary disease. At shift change, patient care transferred to [facility], PA-C for continued evaluation, follow up of BNP, D-Dimer and determination of disposition. Anticipate discharge if negative d-dimer or negative CT angiogram if needed, no significant change in BNP  and negative delta troponin. Patient discussed with Dr. Rhunette Croft who is in agreement with plan.   Final diagnoses:  Chest pain, unspecified chest pain type        Emi Holes, PA-C 02/10/16 1611  Derwood Kaplan, MD 02/12/16 1906

## 2016-02-10 NOTE — ED Notes (Signed)
Pt here for central chest pressure since yesterday while working. No radiation of pain, N/V. Does report some SOB. States her hands and feet are swollen this morning.

## 2016-02-10 NOTE — ED Notes (Signed)
Heather PA made aware pt un connecting herself from monitor stating she has to leave to pick up her child by 6.

## 2016-02-14 ENCOUNTER — Encounter: Payer: Self-pay | Admitting: Cardiovascular Disease

## 2016-02-22 ENCOUNTER — Encounter (HOSPITAL_COMMUNITY): Payer: Self-pay

## 2016-02-22 ENCOUNTER — Emergency Department (HOSPITAL_COMMUNITY)
Admission: EM | Admit: 2016-02-22 | Discharge: 2016-02-22 | Disposition: A | Discharge status: Home / Self Care | Payer: Medicaid Other | Attending: Emergency Medicine | Admitting: Emergency Medicine

## 2016-02-22 DIAGNOSIS — M545 Low back pain: Secondary | ICD-10-CM | POA: Diagnosis present

## 2016-02-22 DIAGNOSIS — M546 Pain in thoracic spine: Secondary | ICD-10-CM | POA: Diagnosis not present

## 2016-02-22 DIAGNOSIS — F17200 Nicotine dependence, unspecified, uncomplicated: Secondary | ICD-10-CM | POA: Diagnosis not present

## 2016-02-22 DIAGNOSIS — Z7982 Long term (current) use of aspirin: Secondary | ICD-10-CM | POA: Diagnosis not present

## 2016-02-22 DIAGNOSIS — I10 Essential (primary) hypertension: Secondary | ICD-10-CM | POA: Insufficient documentation

## 2016-02-22 MED ORDER — KETOROLAC TROMETHAMINE 30 MG/ML IJ SOLN
30.0000 mg | Freq: Once | INTRAMUSCULAR | Status: AC
  Administered 2016-02-22: 30 mg via INTRAVENOUS
  Filled 2016-02-22: qty 1

## 2016-02-22 MED ORDER — ACETAMINOPHEN 325 MG PO TABS
650.0000 mg | ORAL_TABLET | Freq: Once | ORAL | Status: AC
  Administered 2016-02-22: 650 mg via ORAL
  Filled 2016-02-22: qty 2

## 2016-02-22 NOTE — ED Notes (Signed)
Back pain started last night and left shoulder pain feels like a pinched nerve hurts to turn to rt motrin not helping

## 2016-02-22 NOTE — ED Triage Notes (Signed)
Complains of thoracic and lumbar back pain since awakening this am, denies trauma but complains of pain with any movement

## 2016-02-22 NOTE — ED Provider Notes (Signed)
[facility]-EMERGENCY DEPT Provider Note   CSN: [id number] Arrival date & time: 02/22/16  1001  First Provider Contact:  None    By signing my name below, I, Nicole [name], attest that this documentation has been prepared under the direction and in the presence of Burna Forts, PA-C Electronically Signed: Soijett [name], ED Scribe. 02/22/16. 12:40 PM.    History   Chief Complaint Chief Complaint  Patient presents with  . Back Pain    HPI Nicole [name] is a 40 y.o. female who presents to the Emergency Department complaining of sharp lower back pain onset last night. She reports that the back pain does not radiate to her legs. Pt reports that she has had back pain in the past that she was seen in the ED and treated for. She states that she is having associated symptoms of left shoulder pain. She states that she has tried motrin and heat with no relief for her symptoms. Pt denies fever, numbness, tingling, abdominal pain, difficulty urinating, constipation, diarrhea, SOB, and any other symptoms. Denies CA or IV drug use. Denies being pregnant at this time.    The history is provided by the patient. No language interpreter was used.    Past Medical History:  Diagnosis Date  . Hypertension     Patient Active Problem List   Diagnosis Date Noted  . Acute appendicitis 03/25/2014  . Hypertension 03/25/2014    Past Surgical History:  Procedure Laterality Date  . CESAREAN SECTION    . LAPAROSCOPIC APPENDECTOMY N/A 03/25/2014   Procedure: APPENDECTOMY LAPAROSCOPIC;  Surgeon: Cherylynn Ridges, MD;  Location: [facility] OR;  Service: General;  Laterality: N/A;    OB History    No data available       Home Medications    Prior to Admission medications   Medication Sig Start Date End Date Taking? Authorizing Provider  Aspirin-Salicylamide-Caffeine (BC HEADACHE POWDER PO) Take 1 packet by mouth 2 (two) times daily as needed (headaches).    Historical Provider, MD  butamben-tetracaine-benzocaine  (CETACAINE) 08-31-12 % spray Apply 1 spray topically as needed for pain. Patient not taking: Reported on 02/10/2016 11/06/15   Arthor Captain, PA-C  HYDROcodone-acetaminophen (HYCET) 7.5-325 mg/15 ml solution Take 15 mLs by mouth every 8 (eight) hours as needed for moderate pain. Patient not taking: Reported on 02/10/2016 11/06/15   Arthor Captain, PA-C  ibuprofen (ADVIL,MOTRIN) 800 MG tablet Take 1 tablet (800 mg total) by mouth 3 (three) times daily. 02/10/16   Santiago Glad, PA-C  oxyCODONE-acetaminophen (PERCOCET/ROXICET) 5-325 MG per tablet Take 1-2 tablets by mouth every 6 (six) hours as needed for moderate pain or severe pain. Patient not taking: Reported on 02/10/2016 03/26/14   Ashok Norris, NP    Family History No family history on file.  Social History Social History  Substance Use Topics  . Smoking status: Current Every Day Smoker    Packs/day: 0.50  . Smokeless tobacco: Never Used  . Alcohol use No     Allergies   Review of patient's allergies indicates no known allergies.   Review of Systems Review of Systems  Respiratory: Negative for shortness of breath.   Gastrointestinal: Negative for abdominal pain, constipation and diarrhea.  Genitourinary: Negative for difficulty urinating.  Musculoskeletal: Positive for arthralgias (left shoulder ) and back pain (lower).  Skin: Negative for color change, rash and wound.  Neurological: Negative for numbness.       No tingling     Physical Exam Updated Vital Signs BP 135/86 (BP  Location: Right Arm)   Pulse 82   Temp 98 F (36.7 C) (Oral)   Resp 16   Ht  (1.702 m)   Wt (!) 159.7 kg   LMP 01/14/2016   SpO2 98%   BMI 55.13 kg/m   Physical Exam  Constitutional: She is oriented to person, place, and time. She appears well-developed and well-nourished. No distress.  HENT:  Head: Normocephalic and atraumatic.  Eyes: EOM are normal.  Neck: Neck supple.  Cardiovascular: Normal rate.   Pulmonary/Chest: Effort normal.  No respiratory distress.  Abdominal: She exhibits no distension.  Musculoskeletal: Normal range of motion. She exhibits tenderness.  No C, T, L spine tenderness. TTP to bilateral thoracic and lumbar soft tissue. No obvious deformity, swelling, or infection. Distal extremities strength is 5/5. Sensation grossly intact.   Neurological: She is alert and oriented to person, place, and time.  Skin: Skin is warm and dry.  Psychiatric: She has a normal mood and affect. Her behavior is normal.  Nursing note and vitals reviewed.    ED Treatments / Results  DIAGNOSTIC STUDIES: Oxygen Saturation is 98% on RA, nl by my interpretation.    COORDINATION OF CARE: 12:38 PM Discussed treatment plan with pt at bedside which includes toradol injection and tylenol and pt agreed to plan.   Procedures Procedures (including critical care time)  Medications Ordered in ED Medications  ketorolac (TORADOL) 30 MG/ML injection 30 mg (30 mg Intravenous Given 02/22/16 1302)  acetaminophen (TYLENOL) tablet 650 mg (650 mg Oral Given 02/22/16 1302)     Initial Impression / Assessment and Plan / ED Course  I have reviewed the triage vital signs and the nursing notes.   Clinical Course     Final Clinical Impressions(s) / ED Diagnoses   Final diagnoses:  Bilateral thoracic back pain   Labs:   Imaging:   Consults:   Therapeutics: toradol injection and tylenol  Discharge Meds:   Assessment/Plan: Patient presents with likely muscular strain. No sign of neuro impingement, no red flags. Pt ambulates with minimal difficulty. Given strict return precautions in the event new or worsening symptoms present. Pt verbalized understanding and agreement to today's plan and had no further questions or concerns at this time.    New Prescriptions Discharge Medication List as of 02/22/2016 12:44 PM      I personally performed the services described in this documentation, which was scribed in my presence. The  recorded information has been reviewed and is accurate.     Eyvonne Mechanic, PA-C 02/22/16 1438    Charlynne Pander, MD 02/22/16 [phone]

## 2016-03-17 ENCOUNTER — Emergency Department (HOSPITAL_COMMUNITY)
Admission: EM | Admit: 2016-03-17 | Discharge: 2016-03-17 | Disposition: A | Discharge status: Home / Self Care | Payer: No Typology Code available for payment source | Attending: Emergency Medicine | Admitting: Emergency Medicine

## 2016-03-17 ENCOUNTER — Encounter (HOSPITAL_COMMUNITY): Payer: Self-pay | Admitting: Emergency Medicine

## 2016-03-17 DIAGNOSIS — Y92410 Unspecified street and highway as the place of occurrence of the external cause: Secondary | ICD-10-CM | POA: Insufficient documentation

## 2016-03-17 DIAGNOSIS — Z7982 Long term (current) use of aspirin: Secondary | ICD-10-CM | POA: Insufficient documentation

## 2016-03-17 DIAGNOSIS — Y939 Activity, unspecified: Secondary | ICD-10-CM | POA: Diagnosis not present

## 2016-03-17 DIAGNOSIS — F17200 Nicotine dependence, unspecified, uncomplicated: Secondary | ICD-10-CM | POA: Diagnosis not present

## 2016-03-17 DIAGNOSIS — Y999 Unspecified external cause status: Secondary | ICD-10-CM | POA: Diagnosis not present

## 2016-03-17 DIAGNOSIS — S46812A Strain of other muscles, fascia and tendons at shoulder and upper arm level, left arm, initial encounter: Secondary | ICD-10-CM

## 2016-03-17 DIAGNOSIS — S161XXA Strain of muscle, fascia and tendon at neck level, initial encounter: Secondary | ICD-10-CM | POA: Insufficient documentation

## 2016-03-17 DIAGNOSIS — S199XXA Unspecified injury of neck, initial encounter: Secondary | ICD-10-CM | POA: Diagnosis present

## 2016-03-17 DIAGNOSIS — I10 Essential (primary) hypertension: Secondary | ICD-10-CM | POA: Diagnosis not present

## 2016-03-17 MED ORDER — CYCLOBENZAPRINE HCL 10 MG PO TABS: 10.0000 mg | ORAL_TABLET | Freq: Every day | ORAL | 0 refills | Status: DC

## 2016-03-17 MED ORDER — NAPROXEN 500 MG PO TABS: 500.0000 mg | ORAL_TABLET | Freq: Two times a day (BID) | ORAL | 0 refills | Status: DC
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
## 2016-04-27 LAB — PROCEDURE REPORT - SCANNED: PAP SMEAR: NEGATIVE

## 2016-05-23 ENCOUNTER — Emergency Department (HOSPITAL_COMMUNITY): Payer: Medicaid Other

## 2016-05-23 ENCOUNTER — Emergency Department (HOSPITAL_COMMUNITY)
Admission: EM | Admit: 2016-05-23 | Discharge: 2016-05-23 | Disposition: A | Discharge status: Home / Self Care | Payer: Medicaid Other | Attending: Emergency Medicine | Admitting: Emergency Medicine

## 2016-05-23 ENCOUNTER — Encounter (HOSPITAL_COMMUNITY): Payer: Self-pay | Admitting: *Deleted

## 2016-05-23 DIAGNOSIS — J40 Bronchitis, not specified as acute or chronic: Secondary | ICD-10-CM | POA: Insufficient documentation

## 2016-05-23 DIAGNOSIS — I10 Essential (primary) hypertension: Secondary | ICD-10-CM | POA: Insufficient documentation

## 2016-05-23 DIAGNOSIS — Z7982 Long term (current) use of aspirin: Secondary | ICD-10-CM | POA: Diagnosis not present

## 2016-05-23 DIAGNOSIS — J9801 Acute bronchospasm: Secondary | ICD-10-CM | POA: Diagnosis not present

## 2016-05-23 DIAGNOSIS — Z79899 Other long term (current) drug therapy: Secondary | ICD-10-CM | POA: Insufficient documentation

## 2016-05-23 DIAGNOSIS — R05 Cough: Secondary | ICD-10-CM | POA: Diagnosis present

## 2016-05-23 DIAGNOSIS — F17200 Nicotine dependence, unspecified, uncomplicated: Secondary | ICD-10-CM | POA: Diagnosis not present

## 2016-05-23 LAB — BASIC METABOLIC PANEL
Anion gap: 8 (ref 5–15)
BUN: 8 mg/dL (ref 6–20)
CALCIUM: 9.1 mg/dL (ref 8.9–10.3)
CHLORIDE: 104 mmol/L (ref 101–111)
CO2: 24 mmol/L (ref 22–32)
CREATININE: 0.91 mg/dL (ref 0.44–1.00)
GFR calc non Af Amer: >60 mL/min (ref >60)
Glucose, Bld: 122 mg/dL — ABNORMAL HIGH (ref 65–99)
Potassium: 4 mmol/L (ref 3.5–5.1)
SODIUM: 136 mmol/L (ref 135–145)

## 2016-05-23 LAB — I-STAT TROPONIN, ED: TROPONIN I, POC: 0 ng/mL (ref 0.00–0.08)

## 2016-05-23 LAB — I-STAT CG4 LACTIC ACID, ED: LACTIC ACID, VENOUS: 1.4 mmol/L (ref 0.5–1.9)

## 2016-05-23 LAB — CBC
HCT: 40.2 % (ref 36.0–46.0)
Hemoglobin: 13.3 g/dL (ref 12.0–15.0)
MCH: 26.9 pg (ref 26.0–34.0)
MCHC: 33.1 g/dL (ref 30.0–36.0)
MCV: 81.2 fL (ref 78.0–100.0)
PLATELETS: 217 10*3/uL (ref 150–400)
RBC: 4.95 MIL/uL (ref 3.87–5.11)
RDW: 13.7 % (ref 11.5–15.5)
WBC: 10.5 10*3/uL (ref 4.0–10.5)

## 2016-05-23 MED ORDER — ALBUTEROL SULFATE HFA 108 (90 BASE) MCG/ACT IN AERS: 2.0000 | INHALATION_SPRAY | Freq: Four times a day (QID) | RESPIRATORY_TRACT | 2 refills | Status: DC | PRN

## 2016-05-23 MED ORDER — METHYLPREDNISOLONE SODIUM SUCC 125 MG IJ SOLR
125.0000 mg | Freq: Once | INTRAMUSCULAR | Status: AC
  Administered 2016-05-23: 125 mg via INTRAVENOUS
  Filled 2016-05-23: qty 2

## 2016-05-23 MED ORDER — ALBUTEROL (5 MG/ML) CONTINUOUS INHALATION SOLN
10.0000 mg/h | INHALATION_SOLUTION | Freq: Once | RESPIRATORY_TRACT | Status: AC
  Administered 2016-05-23: 10 mg/h via RESPIRATORY_TRACT
  Filled 2016-05-23: qty 20

## 2016-05-23 MED ORDER — ALBUTEROL SULFATE HFA 108 (90 BASE) MCG/ACT IN AERS
1.0000 | INHALATION_SPRAY | RESPIRATORY_TRACT | Status: DC | PRN
  Administered 2016-05-23: 2 via RESPIRATORY_TRACT
  Filled 2016-05-23: qty 6.7

## 2016-05-23 MED ORDER — PREDNISONE 20 MG PO TABS: 60.0000 mg | ORAL_TABLET | Freq: Every day | ORAL | 0 refills | Status: DC

## 2016-05-23 MED ORDER — KETOROLAC TROMETHAMINE 30 MG/ML IJ SOLN
30.0000 mg | Freq: Once | INTRAMUSCULAR | Status: AC
  Administered 2016-05-23: 30 mg via INTRAVENOUS
  Filled 2016-05-23: qty 1

## 2016-05-23 MED ORDER — IPRATROPIUM BROMIDE 0.02 % IN SOLN
0.5000 mg | Freq: Once | RESPIRATORY_TRACT | Status: AC
  Administered 2016-05-23: 0.5 mg via RESPIRATORY_TRACT
  Filled 2016-05-23: qty 2.5

## 2016-05-23 NOTE — ED Triage Notes (Signed)
Pt presents here with chest pain and shortness of breath.  RR- 26.  Hard to hear any breath sounds on the right side. Pt reports she has had cough and has been sleeping sitting up and has some trace edema to LE.  Pt denies any asthma.

## 2016-05-23 NOTE — ED Provider Notes (Signed)
[facility] Provider Note   CSN: [id number] Arrival date & time: 05/23/16  1738     History   Chief Complaint Chief Complaint  Patient presents with  . Shortness of Breath  . Cough    HPI Nicole [name] is a 40 y.o. female.  The history is provided by the patient.  Shortness of Breath  This is a new problem. The average episode lasts 2 days. The problem has been gradually worsening. Associated symptoms include cough, sputum production and chest pain. Pertinent negatives include no fever and no leg swelling. Associated medical issues include pneumonia. Associated medical issues do not include PE or CAD.  Cough  Associated symptoms include chest pain and shortness of breath. Her past medical history is significant for pneumonia.  Pt had a similar episode a few months ago.  She had a workup for PE that was negative.  The discomfort is a pressure in her chest.  It increased with deep breathing.  She has a history of smoking but quit 4 days ago. Past Medical History:  Diagnosis Date  . Hypertension     Patient Active Problem List   Diagnosis Date Noted  . Acute appendicitis 03/25/2014  . Hypertension 03/25/2014    Past Surgical History:  Procedure Laterality Date  . APPENDECTOMY    . CESAREAN SECTION    . HERNIA REPAIR    . LAPAROSCOPIC APPENDECTOMY N/A 03/25/2014   Procedure: APPENDECTOMY LAPAROSCOPIC;  Surgeon: Cherylynn Ridges, MD;  Location: [facility] OR;  Service: General;  Laterality: N/A;    OB History    No data available       Home Medications    Prior to Admission medications   Medication Sig Start Date End Date Taking? Authorizing Provider  Aspirin-Salicylamide-Caffeine (BC HEADACHE POWDER PO) Take 1 packet by mouth 2 (two) times daily as needed (headaches).   Yes Historical Provider, MD  albuterol (PROVENTIL HFA;VENTOLIN HFA) 108 (90 Base) MCG/ACT inhaler Inhale 2 puffs into the lungs every 6 (six) hours as needed for wheezing or shortness of breath.  05/23/16   Linwood Dibbles, MD  butamben-tetracaine-benzocaine (CETACAINE) 08-31-12 % spray Apply 1 spray topically as needed for pain. Patient not taking: Reported on 05/23/2016 11/06/15   Arthor Captain, PA-C  cyclobenzaprine (FLEXERIL) 10 MG tablet Take 1 tablet (10 mg total) by mouth at bedtime. Patient not taking: Reported on 05/23/2016 03/17/16   Gwyneth Sprout, MD  HYDROcodone-acetaminophen (HYCET) 7.5-325 mg/15 ml solution Take 15 mLs by mouth every 8 (eight) hours as needed for moderate pain. Patient not taking: Reported on 05/23/2016 11/06/15   Arthor Captain, PA-C  ibuprofen (ADVIL,MOTRIN) 800 MG tablet Take 1 tablet (800 mg total) by mouth 3 (three) times daily. Patient not taking: Reported on 05/23/2016 02/10/16   Santiago Glad, PA-C  naproxen (NAPROSYN) 500 MG tablet Take 1 tablet (500 mg total) by mouth 2 (two) times daily. Patient not taking: Reported on 05/23/2016 03/17/16   Gwyneth Sprout, MD  oxyCODONE-acetaminophen (PERCOCET/ROXICET) 5-325 MG per tablet Take 1-2 tablets by mouth every 6 (six) hours as needed for moderate pain or severe pain. Patient not taking: Reported on 05/23/2016 03/26/14   Ashok Norris, NP  predniSONE (DELTASONE) 20 MG tablet Take 3 tablets (60 mg total) by mouth daily. 05/23/16   Linwood Dibbles, MD    Family History No family history on file.  Social History Social History  Substance Use Topics  . Smoking status: Current Every Day Smoker    Packs/day: 0.50  . Smokeless tobacco:  Never Used  . Alcohol use No     Allergies   Review of patient's allergies indicates no known allergies.   Review of Systems Review of Systems  Constitutional: Negative for fever.  Respiratory: Positive for cough, sputum production and shortness of breath.   Cardiovascular: Positive for chest pain. Negative for leg swelling.  All other systems reviewed and are negative.    Physical Exam Updated Vital Signs BP 134/82 (BP Location: Right Arm)   Pulse 77   Temp 98.3 F  (36.8 C) (Oral)   Resp 18   SpO2 96%   Physical Exam  Constitutional: She appears well-developed and well-nourished. No distress.  Obese   HENT:  Head: Normocephalic and atraumatic.  Right Ear: External ear normal.  Left Ear: External ear normal.  Eyes: Conjunctivae are normal. Right eye exhibits no discharge. Left eye exhibits no discharge. No scleral icterus.  Neck: Neck supple. No tracheal deviation present.  Cardiovascular: Normal rate, regular rhythm and intact distal pulses.   Pulmonary/Chest: Effort normal. No stridor. No respiratory distress. She has decreased breath sounds. She has wheezes. She has no rales.  Decreased breath sounds bilaterally  Abdominal: Soft. Bowel sounds are normal. She exhibits no distension. There is no tenderness. There is no rebound and no guarding.  Musculoskeletal: She exhibits no edema or tenderness.  Neurological: She is alert. She has normal strength. No cranial nerve deficit (no facial droop, extraocular movements intact, no slurred speech) or sensory deficit. She exhibits normal muscle tone. She displays no seizure activity. Coordination normal.  Skin: Skin is warm and dry. No rash noted.  Psychiatric: She has a normal mood and affect.  Nursing note and vitals reviewed.    ED Treatments / Results  Labs (all labs ordered are listed, but only abnormal results are displayed) Labs Reviewed  BASIC METABOLIC PANEL - Abnormal; Notable for the following:       Result Value   Glucose, Bld 122 (*)    All other components within normal limits  CBC  I-STAT TROPOININ, ED  I-STAT CG4 LACTIC ACID, ED    EKG  EKG Interpretation  Date/Time:  Wednesday May 23 2016 17:48:34 EDT Ventricular Rate:  80 PR Interval:  160 QRS Duration: 92 QT Interval:  362 QTC Calculation: 417 R Axis:   21 Text Interpretation:  Normal sinus rhythm Minimal voltage criteria for LVH, may be normal variant Borderline ECG No significant change since last tracing  Confirmed by Lynnea Vandervoort  MD-J, Duy Lemming ([id number]) on 05/23/2016 6:12:38 PM       Radiology Dg Chest 2 View  Result Date: 05/23/2016 CLINICAL DATA:  Chest tightness, shortness of breath EXAM: CHEST  2 VIEW COMPARISON:  CT chest 02/10/2016 FINDINGS: The heart size and mediastinal contours are within normal limits. Both lungs are clear. The visualized skeletal structures are unremarkable. IMPRESSION: No active cardiopulmonary disease. Electronically Signed   By: Elige KoHetal  Patel   On: 05/23/2016 18:51    Procedures Procedures (including critical care time)  Medications Ordered in ED Medications  albuterol (PROVENTIL HFA;VENTOLIN HFA) 108 (90 Base) MCG/ACT inhaler 1-2 puff (not administered)  methylPREDNISolone sodium succinate (SOLU-MEDROL) 125 mg/2 mL injection 125 mg (125 mg Intravenous Given 05/23/16 1929)  albuterol (PROVENTIL,VENTOLIN) solution continuous neb (10 mg/hr Nebulization Given 05/23/16 1848)  ipratropium (ATROVENT) nebulizer solution 0.5 mg (0.5 mg Nebulization Given 05/23/16 1849)  ketorolac (TORADOL) 30 MG/ML injection 30 mg (30 mg Intravenous Given 05/23/16 1929)     Initial Impression / Assessment and Plan / ED  Course  I have reviewed the triage vital signs and the nursing notes.  Pertinent labs & imaging results that were available during my care of the patient were reviewed by me and considered in my medical decision making (see chart for details).  Clinical Course   The patient's laboratory tests and chest x-ray are reassuring. No acute changes noted on EKG.  Patient had definite wheezing on my exam. She was given an albuterol and Atrovent treatment in the ED. On repeat exam after the treatment her wheezing has resolved. Patient is breathing easily and states she feels much better. I suspect her symptoms are related to bronchospasm. Plan on discharge home with an albuterol inhaler and a steroid course. Follow up with her primary doctor next week.   Final Clinical Impressions(s)  / ED Diagnoses   Final diagnoses:  Bronchitis  Bronchospasm    New Prescriptions New Prescriptions   ALBUTEROL (PROVENTIL HFA;VENTOLIN HFA) 108 (90 BASE) MCG/ACT INHALER    Inhale 2 puffs into the lungs every 6 (six) hours as needed for wheezing or shortness of breath.   PREDNISONE (DELTASONE) 20 MG TABLET    Take 3 tablets (60 mg total) by mouth daily.     Linwood Dibbles, MD 05/23/16 2020

## 2016-05-23 NOTE — ED Notes (Signed)
Pt to xray

## 2016-05-23 NOTE — Progress Notes (Signed)
Came to assess patient, just now getting on and starting my shift. Got report from day shift RT that he started a CAT at 1845 approx. On arrival to patient room, noted patient standing attempting to clean up albuterol medication on the floor that she had spilled from the reservoir. Unclear/unwitnessed on how the medication ended up on the floor. Pt is stable at this time SATs are 99% on room air. No retractions/increase WOB/SOB/ wheezing noted at this time. Breath sounds are diminished. Unclear on how much medications she had received/inhaled. Holley RN notified/aware of my findings.

## 2016-05-23 NOTE — Discharge Instructions (Signed)
Follow-up with your primary care doctor, make sure to take the inhaler and the steroids, return as needed for worsening symptoms

## 2016-09-06 ENCOUNTER — Other Ambulatory Visit (HOSPITAL_COMMUNITY)
Admission: RE | Admit: 2016-09-06 | Discharge: 2016-09-06 | Disposition: A | Discharge status: Home / Self Care | Payer: Medicaid Other | Source: Ambulatory Visit | Attending: Family Medicine | Admitting: Family Medicine

## 2016-09-06 ENCOUNTER — Encounter: Payer: Self-pay | Admitting: Family Medicine

## 2016-09-06 ENCOUNTER — Ambulatory Visit: Payer: Medicaid Other | Attending: Family Medicine | Admitting: Family Medicine

## 2016-09-06 VITALS — BP 136/82 | HR 74 | Temp 98.5°F | Ht 69.0 in | Wt 336.4 lb

## 2016-09-06 DIAGNOSIS — K648 Other hemorrhoids: Secondary | ICD-10-CM

## 2016-09-06 DIAGNOSIS — Z124 Encounter for screening for malignant neoplasm of cervix: Secondary | ICD-10-CM

## 2016-09-06 DIAGNOSIS — Z1231 Encounter for screening mammogram for malignant neoplasm of breast: Secondary | ICD-10-CM

## 2016-09-06 DIAGNOSIS — K625 Hemorrhage of anus and rectum: Secondary | ICD-10-CM | POA: Diagnosis not present

## 2016-09-06 DIAGNOSIS — Z1151 Encounter for screening for human papillomavirus (HPV): Secondary | ICD-10-CM | POA: Diagnosis not present

## 2016-09-06 DIAGNOSIS — Z01419 Encounter for gynecological examination (general) (routine) without abnormal findings: Secondary | ICD-10-CM | POA: Diagnosis present

## 2016-09-06 DIAGNOSIS — Z0000 Encounter for general adult medical examination without abnormal findings: Secondary | ICD-10-CM

## 2016-09-06 DIAGNOSIS — Z7982 Long term (current) use of aspirin: Secondary | ICD-10-CM | POA: Insufficient documentation

## 2016-09-06 DIAGNOSIS — Z113 Encounter for screening for infections with a predominantly sexual mode of transmission: Secondary | ICD-10-CM | POA: Insufficient documentation

## 2016-09-06 DIAGNOSIS — Z79899 Other long term (current) drug therapy: Secondary | ICD-10-CM | POA: Diagnosis not present

## 2016-09-06 DIAGNOSIS — Z1239 Encounter for other screening for malignant neoplasm of breast: Secondary | ICD-10-CM

## 2016-09-06 DIAGNOSIS — I10 Essential (primary) hypertension: Secondary | ICD-10-CM | POA: Diagnosis not present

## 2016-09-06 LAB — CBC WITH DIFFERENTIAL/PLATELET
Basophils Absolute: 0 cells/uL (ref 0–200)
Basophils Relative: 0 %
Eosinophils Absolute: 57 cells/uL (ref 15–500)
Eosinophils Relative: 1 %
HEMATOCRIT: 39.9 % (ref 35.0–45.0)
Hemoglobin: 13 g/dL (ref 11.7–15.5)
Lymphocytes Relative: 28 %
Lymphs Abs: 1596 cells/uL (ref 850–3900)
MCH: 26.9 pg — ABNORMAL LOW (ref 27.0–33.0)
MCHC: 32.6 g/dL (ref 32.0–36.0)
MCV: 82.4 fL (ref 80.0–100.0)
MPV: 10.4 fL (ref 7.5–12.5)
Monocytes Absolute: 342 cells/uL (ref 200–950)
Monocytes Relative: 6 %
NEUTROS PCT: 65 %
Neutro Abs: 3705 cells/uL (ref 1500–7800)
Platelets: 236 10*3/uL (ref 140–400)
RBC: 4.84 MIL/uL (ref 3.80–5.10)
RDW: 14.3 % (ref 11.0–15.0)
WBC: 5.7 10*3/uL (ref 3.8–10.8)

## 2016-09-06 LAB — COMPLETE METABOLIC PANEL WITH GFR
ALBUMIN: 3.9 g/dL (ref 3.6–5.1)
ALT: 24 U/L (ref 6–29)
AST: 27 U/L (ref 10–30)
Alkaline Phosphatase: 69 U/L (ref 33–115)
BUN: 8 mg/dL (ref 7–25)
CHLORIDE: 105 mmol/L (ref 98–110)
CO2: 25 mmol/L (ref 20–31)
Calcium: 9 mg/dL (ref 8.6–10.2)
Creat: 0.88 mg/dL (ref 0.50–1.10)
GFR, Est African American: >89 mL/min (ref >=60)
GFR, Est Non African American: 82 mL/min (ref >=60)
GLUCOSE: 116 mg/dL — AB (ref 65–99)
Potassium: 4.5 mmol/L (ref 3.5–5.3)
SODIUM: 138 mmol/L (ref 135–146)
Total Bilirubin: 0.3 mg/dL (ref 0.2–1.2)
Total Protein: 6.8 g/dL (ref 6.1–8.1)

## 2016-09-06 LAB — LIPID PANEL W/REFLEX DIRECT LDL
CHOL/HDL RATIO: 3.8 ratio (ref <5.0)
Cholesterol: 177 mg/dL (ref <200)
HDL: 47 mg/dL — ABNORMAL LOW (ref >50)
LDL-CHOLESTEROL: 110 mg/dL — AB
Non-HDL Cholesterol (Calc): 130 mg/dL — ABNORMAL HIGH (ref <130)
Triglycerides: 97 mg/dL (ref <150)

## 2016-09-06 LAB — TSH: TSH: 0.88 mIU/L

## 2016-09-06 MED ORDER — NAPROXEN 500 MG PO TABS: 500.0000 mg | ORAL_TABLET | Freq: Two times a day (BID) | ORAL | 0 refills | Status: DC

## 2016-09-06 MED ORDER — METRONIDAZOLE 0.75 % VA GEL: 1.0000 | Freq: Every day | VAGINAL | 0 refills | Status: DC

## 2016-09-06 MED ORDER — CYCLOBENZAPRINE HCL 10 MG PO TABS: 10.0000 mg | ORAL_TABLET | Freq: Every day | ORAL | 0 refills | Status: DC

## 2016-09-06 MED ORDER — HYDROCORTISONE ACE-PRAMOXINE 1-1 % RE CREA: 1.0000 "application " | TOPICAL_CREAM | Freq: Two times a day (BID) | RECTAL | 0 refills | Status: DC

## 2016-09-06 NOTE — Patient Instructions (Signed)

## 2016-09-06 NOTE — Progress Notes (Signed)
Subjective:  Patient ID: Nicole [name], female    DOB: January 29, 1976  Age: 41 y.o. MRN: [id number]  CC: Annual Exam and Rectal Bleeding (2 x's yesterday)   HPI Nicole [name] presents for Complete physical exam and complains of a 2 day history of rectal bleeding. Denies lightheadedness or syncope. Thinks she has  bacterial vaginosis I would like to have treatment for this  Past Medical History:  Diagnosis Date  . Hypertension     Past Surgical History:  Procedure Laterality Date  . APPENDECTOMY    . CESAREAN SECTION    . HERNIA REPAIR    . LAPAROSCOPIC APPENDECTOMY N/A 03/25/2014   Procedure: APPENDECTOMY LAPAROSCOPIC;  Surgeon: Cherylynn Ridges, MD;  Location: [facility] OR;  Service: General;  Laterality: N/A;    No Known Allergies    Outpatient Medications Prior to Visit  Medication Sig Dispense Refill  . albuterol (PROVENTIL HFA;VENTOLIN HFA) 108 (90 Base) MCG/ACT inhaler Inhale 2 puffs into the lungs every 6 (six) hours as needed for wheezing or shortness of breath. 1 Inhaler 2  . Aspirin-Salicylamide-Caffeine (BC HEADACHE POWDER PO) Take 1 packet by mouth 2 (two) times daily as needed (headaches).    Marland Kitchen HYDROcodone-acetaminophen (HYCET) 7.5-325 mg/15 ml solution Take 15 mLs by mouth every 8 (eight) hours as needed for moderate pain. (Patient not taking: Reported on 05/23/2016) 120 mL 0  . oxyCODONE-acetaminophen (PERCOCET/ROXICET) 5-325 MG per tablet Take 1-2 tablets by mouth every 6 (six) hours as needed for moderate pain or severe pain. (Patient not taking: Reported on 05/23/2016) 30 tablet 0  . predniSONE (DELTASONE) 20 MG tablet Take 3 tablets (60 mg total) by mouth daily. (Patient not taking: Reported on 09/06/2016) 15 tablet 0  . butamben-tetracaine-benzocaine (CETACAINE) 08-31-12 % spray Apply 1 spray topically as needed for pain. (Patient not taking: Reported on 05/23/2016) 56 g 0  . cyclobenzaprine (FLEXERIL) 10 MG tablet Take 1 tablet (10 mg total) by mouth at bedtime. (Patient  not taking: Reported on 05/23/2016) 20 tablet 0  . ibuprofen (ADVIL,MOTRIN) 800 MG tablet Take 1 tablet (800 mg total) by mouth 3 (three) times daily. (Patient not taking: Reported on 05/23/2016) 21 tablet 0  . naproxen (NAPROSYN) 500 MG tablet Take 1 tablet (500 mg total) by mouth 2 (two) times daily. (Patient not taking: Reported on 05/23/2016) 30 tablet 0   No facility-administered medications prior to visit.     ROS Review of Systems  Constitutional: Negative for activity change, appetite change and fatigue.  HENT: Negative for congestion, sinus pressure and sore throat.   Eyes: Negative for visual disturbance.  Respiratory: Negative for cough, chest tightness, shortness of breath and wheezing.   Cardiovascular: Negative for chest pain and palpitations.  Gastrointestinal: Positive for blood in stool. Negative for abdominal distention, abdominal pain and constipation.  Endocrine: Negative for polydipsia.  Genitourinary: Negative for dysuria and frequency.  Musculoskeletal: Negative for arthralgias and back pain.  Skin: Negative for rash.  Neurological: Negative for tremors, light-headedness and numbness.  Hematological: Does not bruise/bleed easily.  Psychiatric/Behavioral: Negative for agitation and behavioral problems.    Objective:  BP 136/82 (BP Location: Right Arm, Patient Position: Sitting, Cuff Size: Large)   Pulse 74   Temp 98.5 F (36.9 C) (Oral)   Ht 5\' 9"  (1.753 m)   Wt (!) 336 lb 6.4 oz (152.6 kg)   LMP 08/26/2016 (Approximate)   SpO2 98%   BMI 49.68 kg/m   BP/Weight 09/06/2016 05/23/2016 03/17/2016  Systolic BP 136 134  141  Diastolic BP 82 82 86  Wt. (Lbs) 336.4 - -  BMI 49.68 - -      Physical Exam  Constitutional: She is oriented to person, place, and time. She appears well-developed and well-nourished. No distress.  HENT:  Head: Normocephalic.  Right Ear: External ear normal.  Left Ear: External ear normal.  Nose: Nose normal.  Mouth/Throat:  Oropharynx is clear and moist.  Eyes: Conjunctivae and EOM are normal. Pupils are equal, round, and reactive to light.  Neck: Normal range of motion. No JVD present.  Cardiovascular: Normal rate, regular rhythm, normal heart sounds and intact distal pulses.  Exam reveals no gallop.   No murmur heard. Pulmonary/Chest: Effort normal and breath sounds normal. No respiratory distress. She has no wheezes. She has no rales. She exhibits no tenderness. Right breast exhibits no mass, no skin change and no tenderness. Left breast exhibits no mass, no skin change and no tenderness.  Abdominal: Soft. Bowel sounds are normal. She exhibits no distension and no mass. There is no tenderness.  Genitourinary:     Genitourinary Comments: External genitalia, vagina, cervix, adnexa are all normal  Musculoskeletal: Normal range of motion. She exhibits no edema or tenderness.  Neurological: She is alert and oriented to person, place, and time. She has normal reflexes.  Skin: Skin is warm and dry. She is not diaphoretic.  Psychiatric: She has a normal mood and affect.     Assessment & Plan:   1. Annual physical exam - COMPLETE METABOLIC PANEL WITH GFR - Lipid Panel w/reflex Direct LDL - TSH - Cytology - PAP Mineral  2. Other hemorrhoids Discussed dietary approaches to prevent constipation-increase fruits, vegetables fiber and water intake - CBC with Differential/Platelet - pramoxine-hydrocortisone (ANALPRAM-HC) 1-1 % rectal cream; Place 1 application rectally 2 (two) times daily.  Dispense: 30 g; Refill: 0  3. Morbid obesity (HCC) Discussed an exercise regimen-30 minutes and 5 days of the week  4. Screening for cervical cancer  5. Screening for breast cancer - MM Digital Screening; Future   Meds ordered this encounter  Medications  . naproxen (NAPROSYN) 500 MG tablet    Sig: Take 1 tablet (500 mg total) by mouth 2 (two) times daily.    Dispense:  30 tablet    Refill:  0  . cyclobenzaprine  (FLEXERIL) 10 MG tablet    Sig: Take 1 tablet (10 mg total) by mouth at bedtime.    Dispense:  30 tablet    Refill:  0  . metroNIDAZOLE (METROGEL VAGINAL) 0.75 % vaginal gel    Sig: Place 1 Applicatorful vaginally at bedtime.    Dispense:  70 g    Refill:  0  . pramoxine-hydrocortisone (ANALPRAM-HC) 1-1 % rectal cream    Sig: Place 1 application rectally 2 (two) times daily.    Dispense:  30 g    Refill:  0    Follow-up:   Jaclyn ShaggyEnobong Amao MD

## 2016-09-07 LAB — CYTOLOGY - PAP
ADEQUACY: ABSENT
Bacterial vaginitis: POSITIVE — AB
Candida vaginitis: NEGATIVE
Chlamydia: NEGATIVE
Diagnosis: NEGATIVE
HPV: NOT DETECTED
NEISSERIA GONORRHEA: NEGATIVE
TRICH (WINDOWPATH): NEGATIVE

## 2016-09-17 ENCOUNTER — Telehealth: Payer: Self-pay | Admitting: Family Medicine

## 2016-09-17 NOTE — Telephone Encounter (Signed)
Patient called the office to speak with nurse regarding her lab results. Please f/u.  Thank you.

## 2016-09-19 ENCOUNTER — Telehealth: Payer: Self-pay

## 2016-09-19 NOTE — Telephone Encounter (Signed)
Writer returned patient's call regarding her lab results.  Patient stated understanding.

## 2016-09-19 NOTE — Telephone Encounter (Signed)
-----   Message from Jaclyn ShaggyEnobong Amao, MD sent at 09/10/2016  8:24 AM EST ----- PAP is negative for malignancy; cultures are negative for STD however she tested positive for bacteria vaginosis and already received Metrogel for this at her last visit.

## 2016-11-14 ENCOUNTER — Emergency Department (HOSPITAL_COMMUNITY): Payer: Medicaid Other

## 2016-11-14 ENCOUNTER — Emergency Department (HOSPITAL_COMMUNITY)
Admission: EM | Admit: 2016-11-14 | Discharge: 2016-11-14 | Disposition: A | Discharge status: Home / Self Care | Payer: Medicaid Other | Attending: Emergency Medicine | Admitting: Emergency Medicine

## 2016-11-14 ENCOUNTER — Encounter (HOSPITAL_COMMUNITY): Payer: Self-pay | Admitting: Emergency Medicine

## 2016-11-14 DIAGNOSIS — R0789 Other chest pain: Secondary | ICD-10-CM | POA: Diagnosis present

## 2016-11-14 DIAGNOSIS — I10 Essential (primary) hypertension: Secondary | ICD-10-CM | POA: Diagnosis not present

## 2016-11-14 DIAGNOSIS — F17200 Nicotine dependence, unspecified, uncomplicated: Secondary | ICD-10-CM | POA: Insufficient documentation

## 2016-11-14 DIAGNOSIS — R1013 Epigastric pain: Secondary | ICD-10-CM | POA: Insufficient documentation

## 2016-11-14 DIAGNOSIS — R1010 Upper abdominal pain, unspecified: Secondary | ICD-10-CM

## 2016-11-14 DIAGNOSIS — R110 Nausea: Secondary | ICD-10-CM | POA: Diagnosis not present

## 2016-11-14 LAB — CBC
HCT: 40.7 % (ref 36.0–46.0)
HEMOGLOBIN: 13.1 g/dL (ref 12.0–15.0)
MCH: 26.5 pg (ref 26.0–34.0)
MCHC: 32.2 g/dL (ref 30.0–36.0)
MCV: 82.2 fL (ref 78.0–100.0)
Platelets: 237 10*3/uL (ref 150–400)
RBC: 4.95 MIL/uL (ref 3.87–5.11)
RDW: 13.7 % (ref 11.5–15.5)
WBC: 8.8 10*3/uL (ref 4.0–10.5)

## 2016-11-14 LAB — BASIC METABOLIC PANEL
ANION GAP: 5 (ref 5–15)
BUN: 11 mg/dL (ref 6–20)
CALCIUM: 8.5 mg/dL — AB (ref 8.9–10.3)
CO2: 23 mmol/L (ref 22–32)
Chloride: 108 mmol/L (ref 101–111)
Creatinine, Ser: 0.86 mg/dL (ref 0.44–1.00)
GFR calc Af Amer: >60 mL/min (ref >60)
Glucose, Bld: 110 mg/dL — ABNORMAL HIGH (ref 65–99)
Potassium: 4.1 mmol/L (ref 3.5–5.1)
SODIUM: 136 mmol/L (ref 135–145)

## 2016-11-14 LAB — I-STAT TROPONIN, ED: TROPONIN I, POC: 0 ng/mL (ref 0.00–0.08)

## 2016-11-14 LAB — HEPATIC FUNCTION PANEL
ALT: 21 U/L (ref 14–54)
AST: 23 U/L (ref 15–41)
Albumin: 3.7 g/dL (ref 3.5–5.0)
Alkaline Phosphatase: 74 U/L (ref 38–126)
Bilirubin, Direct: <0.1 mg/dL — ABNORMAL LOW (ref 0.1–0.5)
Total Bilirubin: 0.4 mg/dL (ref 0.3–1.2)
Total Protein: 7.1 g/dL (ref 6.5–8.1)

## 2016-11-14 LAB — I-STAT BETA HCG BLOOD, ED (MC, WL, AP ONLY): I-stat hCG, quantitative: <5 m[IU]/mL (ref <5)

## 2016-11-14 LAB — LIPASE, BLOOD: Lipase: 24 U/L (ref 11–51)

## 2016-11-14 MED ORDER — PROMETHAZINE HCL 25 MG PO TABS
25.0000 mg | ORAL_TABLET | Freq: Once | ORAL | Status: AC
  Administered 2016-11-14: 25 mg via ORAL
  Filled 2016-11-14: qty 1

## 2016-11-14 MED ORDER — PROMETHAZINE HCL 25 MG PO TABS: 25.0000 mg | ORAL_TABLET | Freq: Three times a day (TID) | ORAL | 0 refills | Status: DC | PRN

## 2016-11-14 NOTE — ED Triage Notes (Signed)
Patient c/o generalized chest pain that started while sitting doing nothing.  Patient states that she feels weak and dizzy when pain came on. patient describes pain like she smoked 20 cigarettes. Patient c/o nausea for weeks but denies v/d.

## 2016-11-14 NOTE — ED Provider Notes (Signed)
[facility]-EMERGENCY DEPT Provider Note   CSN: [id number] Arrival date & time: 11/14/16  1531     History   Chief Complaint Chief Complaint  Patient presents with  . Chest Pain  . Nausea    HPI Nicole [name] is a 41 y.o. female.  HPI Patient presents to the emergency department with upper abdominal pain and chest discomfort.  Patient states this all started this morning, but she did have some discomfort last night.  Patient states she did not take any medications prior to arrival that she has felt weak and somewhat dizzy, but did not eat all day, states, that she has had nausea over the past couple of weeks, but no vomiting.  Patient did not take any medications prior to arrivalThe patient denies  shortness of breath, headache,blurred vision, neck pain, fever, cough, weakness, numbness, dizziness, anorexia, edema, vomiting, diarrhea, rash, back pain, dysuria, hematemesis, bloody stool, near syncope, or syncope. Past Medical History:  Diagnosis Date  . Hypertension     Patient Active Problem List   Diagnosis Date Noted  . Morbid obesity (HCC) 09/06/2016  . Acute appendicitis 03/25/2014  . Hypertension 03/25/2014    Past Surgical History:  Procedure Laterality Date  . APPENDECTOMY    . CESAREAN SECTION    . HERNIA REPAIR    . LAPAROSCOPIC APPENDECTOMY N/A 03/25/2014   Procedure: APPENDECTOMY LAPAROSCOPIC;  Surgeon: Cherylynn Ridges, MD;  Location: [facility] OR;  Service: General;  Laterality: N/A;    OB History    No data available       Home Medications    Prior to Admission medications   Medication Sig Start Date End Date Taking? Authorizing Provider  albuterol (PROVENTIL HFA;VENTOLIN HFA) 108 (90 Base) MCG/ACT inhaler Inhale 2 puffs into the lungs every 6 (six) hours as needed for wheezing or shortness of breath. 05/23/16  Yes Linwood Dibbles, MD  Aspirin-Salicylamide-Caffeine [facility] HEADACHE POWDER PO) Take 1 packet by mouth 2 (two) times daily as needed (headaches).   Yes Historical  Provider, MD  cyclobenzaprine (FLEXERIL) 10 MG tablet Take 1 tablet (10 mg total) by mouth at bedtime. Patient not taking: Reported on 11/14/2016 09/06/16   Jaclyn Shaggy, MD  HYDROcodone-acetaminophen (HYCET) 7.5-325 mg/15 ml solution Take 15 mLs by mouth every 8 (eight) hours as needed for moderate pain. Patient not taking: Reported on 05/23/2016 11/06/15   Arthor Captain, PA-C  metroNIDAZOLE (METROGEL VAGINAL) 0.75 % vaginal gel Place 1 Applicatorful vaginally at bedtime. Patient not taking: Reported on 11/14/2016 09/06/16   Jaclyn Shaggy, MD  naproxen (NAPROSYN) 500 MG tablet Take 1 tablet (500 mg total) by mouth 2 (two) times daily. Patient not taking: Reported on 11/14/2016 09/06/16   Jaclyn Shaggy, MD  oxyCODONE-acetaminophen (PERCOCET/ROXICET) 5-325 MG per tablet Take 1-2 tablets by mouth every 6 (six) hours as needed for moderate pain or severe pain. Patient not taking: Reported on 05/23/2016 03/26/14   Ashok Norris, NP  pramoxine-hydrocortisone [facility]) 1-1 % rectal cream Place 1 application rectally 2 (two) times daily. Patient not taking: Reported on 11/14/2016 09/06/16   Jaclyn Shaggy, MD  predniSONE (DELTASONE) 20 MG tablet Take 3 tablets (60 mg total) by mouth daily. Patient not taking: Reported on 09/06/2016 05/23/16   Linwood Dibbles, MD    Family History No family history on file.  Social History Social History  Substance Use Topics  . Smoking status: Current Every Day Smoker    Packs/day: 0.50  . Smokeless tobacco: Never Used     Comment: 7 cigs  daily  . Alcohol use No     Allergies   Patient has no known allergies.   Review of Systems Review of Systems  All other systems negative except as documented in the HPI. All pertinent positives and negatives as reviewed in the HPI. Physical Exam Updated Vital Signs BP 136/81 (BP Location: Right Arm)   Pulse 76   Temp 98.7 F (37.1 C)   Resp 18   LMP 10/16/2016   SpO2 99%   Physical Exam  Constitutional: She is oriented to  person, place, and time. She appears well-developed and well-nourished. No distress.  HENT:  Head: Normocephalic and atraumatic.  Mouth/Throat: Oropharynx is clear and moist.  Eyes: Pupils are equal, round, and reactive to light.  Neck: Normal range of motion. Neck supple.  Cardiovascular: Normal rate, regular rhythm and normal heart sounds.  Exam reveals no gallop and no friction rub.   No murmur heard. Pulmonary/Chest: Effort normal and breath sounds normal. No respiratory distress. She has no wheezes.  Abdominal: Soft. Normal appearance and bowel sounds are normal. She exhibits no distension and no mass. There is tenderness in the epigastric area. There is no guarding.  Neurological: She is alert and oriented to person, place, and time. She exhibits normal muscle tone. Coordination normal.  Skin: Skin is warm and dry. Capillary refill takes less than 2 seconds. No rash noted. No erythema.  Psychiatric: She has a normal mood and affect. Her behavior is normal.  Nursing note and vitals reviewed.    ED Treatments / Results  Labs (all labs ordered are listed, but only abnormal results are displayed) Labs Reviewed  BASIC METABOLIC PANEL - Abnormal; Notable for the following:       Result Value   Glucose, Bld 110 (*)    Calcium 8.5 (*)    All other components within normal limits  HEPATIC FUNCTION PANEL - Abnormal; Notable for the following:    Bilirubin, Direct <0.1 (*)    All other components within normal limits  CBC  LIPASE, BLOOD  I-STAT TROPOININ, ED  I-STAT BETA HCG BLOOD, ED ([facility], [facility], [facility] ONLY)    EKG  EKG Interpretation None       Radiology Dg Chest 2 View  Result Date: 11/14/2016 CLINICAL DATA:  Chest pain, body aches, shortness of breath starting today EXAM: CHEST  2 VIEW COMPARISON:  05/23/2016 FINDINGS: Cardiomediastinal silhouette is stable. No infiltrate or pleural effusion. No pulmonary edema. Stable degenerative changes mid and lower thoracic spine.  IMPRESSION: No active cardiopulmonary disease. Electronically Signed   By: Natasha Mead M.D.   On: 11/14/2016 16:40   US Abdomen Limited  Result Date: 11/14/2016 CLINICAL DATA:  Acute onset of upper abdominal pain and nausea. Initial encounter. EXAM: US ABDOMEN LIMITED - RIGHT UPPER QUADRANT COMPARISON:  CT of the abdomen and pelvis, and abdominal ultrasound, performed 03/25/2014 FINDINGS: Gallbladder: No gallstones or wall thickening visualized. No sonographic Murphy sign noted by sonographer. Common bile duct: Diameter: 0.3 cm, within normals in caliber. Liver: No focal lesion identified. Diffusely increased parenchymal echogenicity and coarsened echotexture, compatible with fatty infiltration. IMPRESSION: 1. No acute abnormality seen at the right upper quadrant. 2. Diffuse fatty infiltration within the liver. Electronically Signed   By: Roanna Raider M.D.   On: 11/14/2016 19:54    Procedures Procedures (including critical care time)  Medications Ordered in ED Medications  promethazine (PHENERGAN) tablet 25 mg (not administered)     Initial Impression / Assessment and Plan / ED Course  I have reviewed the triage vital signs and the nursing notes.  Pertinent labs & imaging results that were available during my care of the patient were reviewed by me and considered in my medical decision making (see chart for details).     Patient has negative troponins, chest pain started greater than 12 hours ago.  The patient has upper abdominal discomfort.  We did do an option blood for any signs of any hepatobiliary issues.  Patient has not eaten all day and thinks that count is 4.  Some of her weakness.  Patient is not orthostatic.  She is ambulated without difficulty.  The patient is advised to follow-up with her primary care Dr. told to return here as needed.  At this point, I do not have a clear definition episodes causing the upper abdominal chest discomfort and seems likely to be GI related  Final  Clinical Impressions(s) / ED Diagnoses   Final diagnoses:  None    New Prescriptions New Prescriptions   No medications on file     Charlestine Night, PA-C 11/16/16 0102    Mancel Bale, MD 11/16/16 [phone]

## 2016-11-14 NOTE — Discharge Instructions (Signed)
Return here as needed. The testing here today was normal.  Follow up with your doctor.

## 2016-12-08 ENCOUNTER — Emergency Department (HOSPITAL_COMMUNITY)
Admission: EM | Admit: 2016-12-08 | Discharge: 2016-12-08 | Disposition: A | Discharge status: Home / Self Care | Payer: Medicaid Other | Attending: Emergency Medicine | Admitting: Emergency Medicine

## 2016-12-08 ENCOUNTER — Emergency Department (HOSPITAL_COMMUNITY): Payer: Medicaid Other

## 2016-12-08 ENCOUNTER — Encounter (HOSPITAL_COMMUNITY): Payer: Self-pay | Admitting: Emergency Medicine

## 2016-12-08 DIAGNOSIS — Z5321 Procedure and treatment not carried out due to patient leaving prior to being seen by health care provider: Secondary | ICD-10-CM | POA: Insufficient documentation

## 2016-12-08 DIAGNOSIS — M542 Cervicalgia: Secondary | ICD-10-CM | POA: Insufficient documentation

## 2016-12-08 LAB — CBC
HCT: 39.1 % (ref 36.0–46.0)
HEMOGLOBIN: 12.5 g/dL (ref 12.0–15.0)
MCH: 26.3 pg (ref 26.0–34.0)
MCHC: 32 g/dL (ref 30.0–36.0)
MCV: 82.1 fL (ref 78.0–100.0)
Platelets: 244 10*3/uL (ref 150–400)
RBC: 4.76 MIL/uL (ref 3.87–5.11)
RDW: 13.8 % (ref 11.5–15.5)
WBC: 9.7 10*3/uL (ref 4.0–10.5)

## 2016-12-08 LAB — I-STAT TROPONIN, ED: TROPONIN I, POC: 0.01 ng/mL (ref 0.00–0.08)

## 2016-12-08 LAB — BASIC METABOLIC PANEL
ANION GAP: 5 (ref 5–15)
BUN: 10 mg/dL (ref 6–20)
CALCIUM: 8.6 mg/dL — AB (ref 8.9–10.3)
CO2: 24 mmol/L (ref 22–32)
Chloride: 106 mmol/L (ref 101–111)
Creatinine, Ser: 0.89 mg/dL (ref 0.44–1.00)
GFR calc non Af Amer: >60 mL/min (ref >60)
GLUCOSE: 125 mg/dL — AB (ref 65–99)
POTASSIUM: 3.8 mmol/L (ref 3.5–5.1)
SODIUM: 135 mmol/L (ref 135–145)

## 2016-12-08 NOTE — ED Notes (Signed)
Patient did not answer when called x 3 

## 2016-12-08 NOTE — ED Triage Notes (Signed)
Pt states that she fell asleep in recliner last night -- when she woke up, had pain in right side of neck, not stiff, and has had weakness in right arm-- increased swelling in ankles, 2+ pitting edema, decreased urine output since yesterday.

## 2016-12-18 ENCOUNTER — Encounter (HOSPITAL_COMMUNITY): Payer: Self-pay | Admitting: *Deleted

## 2016-12-18 ENCOUNTER — Inpatient Hospital Stay (HOSPITAL_COMMUNITY)
Admission: AD | Admit: 2016-12-18 | Discharge: 2016-12-18 | Disposition: A | Discharge status: Home / Self Care | Payer: Medicaid Other | Source: Ambulatory Visit | Attending: Family Medicine | Admitting: Family Medicine

## 2016-12-18 DIAGNOSIS — R062 Wheezing: Secondary | ICD-10-CM | POA: Diagnosis not present

## 2016-12-18 DIAGNOSIS — B9689 Other specified bacterial agents as the cause of diseases classified elsewhere: Secondary | ICD-10-CM | POA: Diagnosis not present

## 2016-12-18 DIAGNOSIS — N83209 Unspecified ovarian cyst, unspecified side: Secondary | ICD-10-CM | POA: Diagnosis not present

## 2016-12-18 DIAGNOSIS — F17210 Nicotine dependence, cigarettes, uncomplicated: Secondary | ICD-10-CM | POA: Diagnosis not present

## 2016-12-18 DIAGNOSIS — Z7951 Long term (current) use of inhaled steroids: Secondary | ICD-10-CM | POA: Diagnosis not present

## 2016-12-18 DIAGNOSIS — N760 Acute vaginitis: Secondary | ICD-10-CM | POA: Diagnosis not present

## 2016-12-18 DIAGNOSIS — M170 Bilateral primary osteoarthritis of knee: Secondary | ICD-10-CM | POA: Diagnosis not present

## 2016-12-18 DIAGNOSIS — I10 Essential (primary) hypertension: Secondary | ICD-10-CM | POA: Diagnosis not present

## 2016-12-18 DIAGNOSIS — N898 Other specified noninflammatory disorders of vagina: Secondary | ICD-10-CM | POA: Diagnosis present

## 2016-12-18 HISTORY — DX: Unspecified osteoarthritis, unspecified site: M19.90

## 2016-12-18 HISTORY — DX: Gestational (pregnancy-induced) hypertension without significant proteinuria, unspecified trimester: O13.9

## 2016-12-18 HISTORY — DX: Unspecified ovarian cyst, unspecified side: N83.209

## 2016-12-18 LAB — URINALYSIS, ROUTINE W REFLEX MICROSCOPIC
BACTERIA UA: NONE SEEN
Bilirubin Urine: NEGATIVE
Glucose, UA: NEGATIVE mg/dL
Ketones, ur: NEGATIVE mg/dL
Leukocytes, UA: NEGATIVE
NITRITE: NEGATIVE
PROTEIN: NEGATIVE mg/dL
SPECIFIC GRAVITY, URINE: 1.026 (ref 1.005–1.030)
pH: 5 (ref 5.0–8.0)

## 2016-12-18 LAB — WET PREP, GENITAL
Sperm: NONE SEEN
TRICH WET PREP: NONE SEEN
WBC WET PREP: NONE SEEN
YEAST WET PREP: NONE SEEN

## 2016-12-18 LAB — POCT PREGNANCY, URINE: PREG TEST UR: NEGATIVE

## 2016-12-18 MED ORDER — METRONIDAZOLE 500 MG PO TABS: 500.0000 mg | ORAL_TABLET | Freq: Two times a day (BID) | ORAL | 0 refills | Status: DC

## 2016-12-18 NOTE — MAU Note (Signed)
Has irritation down there, it's really really really irritated.  Started today.  There is an odor, itches, feels swole.  Feels like she is cut, thinks it is from where she rubbed so hard to ease the itch.  Only hurts when she pees.  Does not hurt to touch

## 2016-12-18 NOTE — MAU Provider Note (Signed)
History     CSN: [id number]  Arrival date and time: 12/18/16 1234   First Provider Initiated Contact with Patient 12/18/16 1358      Chief Complaint  Patient presents with  . vag irritation   Non-pregnant female here with vaginal irritation. Sx started yesterday. Area feels swollen and itchy. Has used a new soap recently. No vaginal discharge but has odor, noticed today. She is not sexually active.     Pertinent Gynecological History: Menses: 12/14/16 Contraception: abstinence Sexually transmitted diseases: past history: CT Last mammogram: never Last pap: normal Date: 2018  Past Medical History:  Diagnosis Date  . Arthritis    knees  . Hypertension   . Ovarian cyst   . Pregnancy induced hypertension     Past Surgical History:  Procedure Laterality Date  . APPENDECTOMY    . CESAREAN SECTION    . HERNIA REPAIR    . LAPAROSCOPIC APPENDECTOMY N/A 03/25/2014   Procedure: APPENDECTOMY LAPAROSCOPIC;  Surgeon: Cherylynn Ridges, MD;  Location: [facility] OR;  Service: General;  Laterality: N/A;    Family History  Problem Relation Age of Onset  . Hypertension Mother   . Cancer Mother        pancreatic cancer  . Arthritis Father   . Heart disease Maternal Grandmother     Social History  Substance Use Topics  . Smoking status: Current Every Day Smoker    Packs/day: 0.25    Years: 27.00    Types: Cigarettes  . Smokeless tobacco: Never Used     Comment: 6 cigs daily  . Alcohol use Yes     Comment: occ    Allergies: No Known Allergies  Prescriptions Prior to Admission  Medication Sig Dispense Refill Last Dose  . albuterol (PROVENTIL HFA;VENTOLIN HFA) 108 (90 Base) MCG/ACT inhaler Inhale 2 puffs into the lungs every 6 (six) hours as needed for wheezing or shortness of breath. 1 Inhaler 2 Past Month at Unknown time  . Aspirin-Salicylamide-Caffeine (BC HEADACHE POWDER PO) Take 1 packet by mouth 2 (two) times daily as needed (headaches).   Past Month at Unknown time  .  cyclobenzaprine (FLEXERIL) 10 MG tablet Take 1 tablet (10 mg total) by mouth at bedtime. (Patient not taking: Reported on 11/14/2016) 30 tablet 0 Not Taking at Unknown time  . HYDROcodone-acetaminophen (HYCET) 7.5-325 mg/15 ml solution Take 15 mLs by mouth every 8 (eight) hours as needed for moderate pain. (Patient not taking: Reported on 05/23/2016) 120 mL 0 Not Taking at Unknown time  . metroNIDAZOLE (METROGEL VAGINAL) 0.75 % vaginal gel Place 1 Applicatorful vaginally at bedtime. (Patient not taking: Reported on 11/14/2016) 70 g 0 Completed Course at Unknown time  . naproxen (NAPROSYN) 500 MG tablet Take 1 tablet (500 mg total) by mouth 2 (two) times daily. (Patient not taking: Reported on 11/14/2016) 30 tablet 0 Not Taking at Unknown time  . pramoxine-hydrocortisone (ANALPRAM-HC) 1-1 % rectal cream Place 1 application rectally 2 (two) times daily. (Patient not taking: Reported on 11/14/2016) 30 g 0 Completed Course at Unknown time  . predniSONE (DELTASONE) 20 MG tablet Take 3 tablets (60 mg total) by mouth daily. (Patient not taking: Reported on 09/06/2016) 15 tablet 0 Completed Course at Unknown time  . promethazine (PHENERGAN) 25 MG tablet Take 1 tablet (25 mg total) by mouth every 8 (eight) hours as needed for nausea or vomiting. 15 tablet 0     Review of Systems  Constitutional: Negative.   Genitourinary: Positive for vaginal pain. Negative for vaginal  discharge.   Physical Exam   Blood pressure 129/86, pulse 76, temperature 98.4 F (36.9 C), temperature source Oral, resp. rate 18, weight (!) 157.1 kg (346 lb 4 oz), last menstrual period 12/14/2016, SpO2 99 %.  Physical Exam  Constitutional: She is oriented to person, place, and time. She appears well-developed and well-nourished. No distress.  HENT:  Head: Normocephalic and atraumatic.  Neck: Normal range of motion.  Respiratory: Effort normal.  Genitourinary: There is no tenderness, lesion or injury on the right labia. There is no  tenderness, lesion or injury on the left labia. No erythema in the vagina. No signs of injury around the vagina.  Genitourinary Comments: External: no lesions or erythema or edema Vagina: rugated, parous, thin brown malodorous discharge   Musculoskeletal: Normal range of motion.  Neurological: She is alert and oriented to person, place, and time.  Skin: Skin is warm and dry.  Psychiatric: She has a normal mood and affect.   Results for orders placed or performed during the hospital encounter of 12/18/16 (from the past 24 hour(s))  Urinalysis, Routine w reflex microscopic     Status: Abnormal   Collection Time: 12/18/16  1:18 PM  Result Value Ref Range   Color, Urine YELLOW YELLOW   APPearance CLEAR CLEAR   Specific Gravity, Urine 1.026 1.005 - 1.030   pH 5.0 5.0 - 8.0   Glucose, UA NEGATIVE NEGATIVE mg/dL   Hgb urine dipstick MODERATE (A) NEGATIVE   Bilirubin Urine NEGATIVE NEGATIVE   Ketones, ur NEGATIVE NEGATIVE mg/dL   Protein, ur NEGATIVE NEGATIVE mg/dL   Nitrite NEGATIVE NEGATIVE   Leukocytes, UA NEGATIVE NEGATIVE   RBC / HPF 0-5 0 - 5 RBC/hpf   WBC, UA 0-5 0 - 5 WBC/hpf   Bacteria, UA NONE SEEN NONE SEEN   Squamous Epithelial / LPF 0-5 (A) NONE SEEN   Mucous PRESENT   Pregnancy, urine POC     Status: None   Collection Time: 12/18/16  1:49 PM  Result Value Ref Range   Preg Test, Ur NEGATIVE NEGATIVE  Wet prep, genital     Status: Abnormal   Collection Time: 12/18/16  2:11 PM  Result Value Ref Range   Yeast Wet Prep HPF POC NONE SEEN NONE SEEN   Trich, Wet Prep NONE SEEN NONE SEEN   Clue Cells Wet Prep HPF POC PRESENT (A) NONE SEEN   WBC, Wet Prep HPF POC NONE SEEN NONE SEEN   Sperm NONE SEEN    MAU Course  Procedures  MDM Labs ordered and reviewed. GC/CT pending. Will treat BV. Stable for discharge home.  Assessment and Plan   1. Bacterial vaginosis    Discharge home Rx Flagyl Avoid ETOH Tub soaks with baking soda Avoid fragranced soaps Use warm water  only in genital area  Allergies as of 12/18/2016   No Known Allergies     Medication List    STOP taking these medications   cyclobenzaprine 10 MG tablet Commonly known as:  FLEXERIL   metroNIDAZOLE 0.75 % vaginal gel Commonly known as:  METROGEL VAGINAL   pramoxine-hydrocortisone 1-1 % rectal cream Commonly known as:  ANALPRAM-HC   predniSONE 20 MG tablet Commonly known as:  DELTASONE   promethazine 25 MG tablet Commonly known as:  PHENERGAN     TAKE these medications   albuterol 108 (90 Base) MCG/ACT inhaler Commonly known as:  PROVENTIL HFA;VENTOLIN HFA Inhale 2 puffs into the lungs every 6 (six) hours as needed for wheezing or shortness of  breath.   BC HEADACHE POWDER PO Take 1 packet by mouth 2 (two) times daily as needed (headaches).   metroNIDAZOLE 500 MG tablet Commonly known as:  FLAGYL Take 1 tablet (500 mg total) by mouth 2 (two) times daily.   naproxen 500 MG tablet Commonly known as:  NAPROSYN Take 1 tablet (500 mg total) by mouth 2 (two) times daily.      Donette Larry, CNM 12/18/2016, 1:59 PM

## 2016-12-19 LAB — GC/CHLAMYDIA PROBE AMP (~~LOC~~) NOT AT ARMC
Chlamydia: NEGATIVE
Neisseria Gonorrhea: NEGATIVE

## 2017-03-21 ENCOUNTER — Telehealth: Payer: Self-pay | Admitting: Family Medicine

## 2017-03-21 NOTE — Telephone Encounter (Signed)
Pt called to speak with the nurse or the pcp, she has  Vaginal infection and she want the same med that was prescribe to her be sent to the pharmacy, she was inform the only current med she is on is Naproxen, she said that is not that she want to talk a nurse an argue about this, please follow up

## 2017-03-25 MED ORDER — METRONIDAZOLE 0.75 % VA GEL: 1.0000 | Freq: Every day | VAGINAL | 0 refills | Status: DC

## 2017-03-25 NOTE — Telephone Encounter (Signed)
Pt has not been seen since February. Pt was positive for BV then. Pt would like a script for Metrogel. Does pt need a appointment first?

## 2017-03-25 NOTE — Telephone Encounter (Signed)
MetroGel sent to pharmacy on file

## 2017-03-26 NOTE — Telephone Encounter (Signed)
Pt was called and informed of medication being sent to pharmacy. 

## 2017-04-15 ENCOUNTER — Encounter: Payer: Self-pay | Admitting: Family Medicine

## 2017-04-15 ENCOUNTER — Ambulatory Visit: Payer: Medicaid Other | Attending: Family Medicine | Admitting: Family Medicine

## 2017-04-15 VITALS — BP 133/83 | HR 75 | Temp 98.5°F | Wt 349.0 lb

## 2017-04-15 DIAGNOSIS — M25561 Pain in right knee: Secondary | ICD-10-CM | POA: Diagnosis not present

## 2017-04-15 DIAGNOSIS — N760 Acute vaginitis: Secondary | ICD-10-CM | POA: Diagnosis not present

## 2017-04-15 DIAGNOSIS — G8929 Other chronic pain: Secondary | ICD-10-CM

## 2017-04-15 DIAGNOSIS — Z6841 Body Mass Index (BMI) 40.0 and over, adult: Secondary | ICD-10-CM | POA: Insufficient documentation

## 2017-04-15 DIAGNOSIS — Z9889 Other specified postprocedural states: Secondary | ICD-10-CM | POA: Diagnosis not present

## 2017-04-15 DIAGNOSIS — Z79899 Other long term (current) drug therapy: Secondary | ICD-10-CM | POA: Insufficient documentation

## 2017-04-15 DIAGNOSIS — B9689 Other specified bacterial agents as the cause of diseases classified elsewhere: Secondary | ICD-10-CM | POA: Insufficient documentation

## 2017-04-15 DIAGNOSIS — I10 Essential (primary) hypertension: Secondary | ICD-10-CM | POA: Diagnosis not present

## 2017-04-15 DIAGNOSIS — Z7982 Long term (current) use of aspirin: Secondary | ICD-10-CM | POA: Insufficient documentation

## 2017-04-15 MED ORDER — NAPROXEN 500 MG PO TABS: 500.0000 mg | ORAL_TABLET | Freq: Two times a day (BID) | ORAL | 1 refills | Status: DC

## 2017-04-15 MED ORDER — TRAMADOL HCL 50 MG PO TABS: 50.0000 mg | ORAL_TABLET | Freq: Two times a day (BID) | ORAL | 1 refills | Status: DC | PRN

## 2017-04-15 MED ORDER — METRONIDAZOLE 500 MG PO TABS: 500.0000 mg | ORAL_TABLET | Freq: Two times a day (BID) | ORAL | 0 refills | Status: DC

## 2017-04-15 MED ORDER — PREDNISONE 20 MG PO TABS: 20.0000 mg | ORAL_TABLET | Freq: Two times a day (BID) | ORAL | 0 refills | Status: DC

## 2017-04-15 NOTE — Patient Instructions (Signed)

## 2017-04-15 NOTE — Progress Notes (Signed)
Subjective:  Patient ID: Nicole [name], female    DOB: [birth year]  Age: 41 y.o. MRN: [id number]  CC: Knee Pain   HPI Nicole [name] is a 41 year old female with morbid obesity who presents with one-month history of worsening right knee pain which has not responded to the use of naproxen. Pain is worse when she flexes her knee or with prolonged standing and is worse on the medial aspect of her knee; rated as severe. She is unable to bend her knees to put her shoes and has to have her son do that for her. 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 Also noticed some swelling of her right knee; informs me she does have underlying history of osteoarthritis. Application of ice, use of knee brace has been ineffective.  She is requesting a prescription for Flagyl for treatment of bacterial vaginosis she has a vaginal discharge which she has had in the past with bacteria vaginosis.  Past Medical History:  Diagnosis Date  . Arthritis    knees  . Hypertension   . Ovarian cyst   . Pregnancy induced hypertension     Past Surgical History:  Procedure Laterality Date  . APPENDECTOMY    . CESAREAN SECTION    . HERNIA REPAIR    . LAPAROSCOPIC APPENDECTOMY N/A 03/25/2014   Procedure: APPENDECTOMY LAPAROSCOPIC;  Surgeon: Cherylynn Ridges, MD;  Location: [facility] OR;  Service: General;  Laterality: N/A;    No Known Allergies   Outpatient Medications Prior to Visit  Medication Sig Dispense Refill  . albuterol (PROVENTIL HFA;VENTOLIN HFA) 108 (90 Base) MCG/ACT inhaler Inhale 2 puffs into the lungs every 6 (six) hours as needed for wheezing or shortness of breath. 1 Inhaler 2  . Aspirin-Salicylamide-Caffeine (BC HEADACHE POWDER PO) Take 1 packet by mouth 2 (two) times daily as needed (headaches).    . naproxen (NAPROSYN) 500 MG tablet Take 1 tablet (500 mg total) by mouth 2 (two) times daily. 30 tablet 0  . metroNIDAZOLE (FLAGYL) 500 MG tablet Take 1 tablet (500 mg total) by mouth 2  (two) times daily. (Patient not taking: Reported on 04/15/2017) 14 tablet 0  . metroNIDAZOLE (METROGEL VAGINAL) 0.75 % vaginal gel Place 1 Applicatorful vaginally at bedtime. (Patient not taking: Reported on 04/15/2017) 70 g 0   No facility-administered medications prior to visit.     ROS Review of Systems  Constitutional: Negative for activity change and appetite change.  HENT: Negative for sinus pressure and sore throat.   Respiratory: Negative for chest tightness, shortness of breath and wheezing.   Cardiovascular: Negative for chest pain and palpitations.  Gastrointestinal: Negative for abdominal distention, abdominal pain and constipation.  Genitourinary: Positive for vaginal discharge.  Musculoskeletal:       See hpi  Psychiatric/Behavioral: Negative for behavioral problems and dysphoric mood.    Objective:  BP 133/83   Pulse 75   Temp 98.5 F (36.9 C) (Oral)   Wt (!) 349 lb (158.3 kg)   SpO2 98%   BMI 53.07 kg/m   BP/Weight 04/15/2017 12/18/2016 12/08/2016  Systolic BP 133 154 127  Diastolic BP 83 88 97  Wt. (Lbs) 349 346.25 350  BMI 53.07 52.65 53.22      Physical Exam  Constitutional: She is oriented to person, place, and time. She appears well-developed and well-nourished.  Morbidly obese  Cardiovascular: Normal rate, normal heart sounds and intact distal pulses.   No murmur heard. Pulmonary/Chest: Effort normal and  breath sounds normal. She has no wheezes. She has no rales. She exhibits no tenderness.  Abdominal: Soft. Bowel sounds are normal. She exhibits no distension and no mass. There is no tenderness.  Musculoskeletal: She exhibits edema (slight right knee edema more on the medial aspect) and tenderness (Tenderness on deep palpation of the medial aspect of the right knee and on flexion and extension; left is normal).  Neurological: She is alert and oriented to person, place, and time.     Assessment & Plan:   1. Chronic pain of right knee Possible  underlying osteoarthritis Short course of prednisone-side effects discussed She is not interested in possible corticosteroid injections down the Route Advised that weight loss will help alleviate condition Provided a note for her job to allow intermittent sitting. - traMADol (ULTRAM) 50 MG tablet; Take 1 tablet (50 mg total) by mouth every 12 (twelve) hours as needed.  Dispense: 60 tablet; Refill: 1 - naproxen (NAPROSYN) 500 MG tablet; Take 1 tablet (500 mg total) by mouth 2 (two) times daily.  Dispense: 30 tablet; Refill: 1 - DG Knee Complete 4 Views Right; Future  2. Bacterial vaginosis - metroNIDAZOLE (FLAGYL) 500 MG tablet; Take 1 tablet (500 mg total) by mouth 2 (two) times daily.  Dispense: 14 tablet; Refill: 0   Meds ordered this encounter  Medications  . metroNIDAZOLE (FLAGYL) 500 MG tablet    Sig: Take 1 tablet (500 mg total) by mouth 2 (two) times daily.    Dispense:  14 tablet    Refill:  0  . traMADol (ULTRAM) 50 MG tablet    Sig: Take 1 tablet (50 mg total) by mouth every 12 (twelve) hours as needed.    Dispense:  60 tablet    Refill:  1  . naproxen (NAPROSYN) 500 MG tablet    Sig: Take 1 tablet (500 mg total) by mouth 2 (two) times daily.    Dispense:  30 tablet    Refill:  1    Follow-up: Return in about 3 months (around 07/15/2017) for Follow-up for knee pain.   Jaclyn Shaggy MD

## 2017-07-15 ENCOUNTER — Ambulatory Visit: Payer: Medicaid Other | Admitting: Family Medicine

## 2017-09-26 ENCOUNTER — Telehealth: Payer: Self-pay | Admitting: Family Medicine

## 2017-09-26 NOTE — Telephone Encounter (Signed)
Pt called to request a refill on  - metroNIDAZOLE (FLAGYL) 500 MG tablet  -predniSONE (DELTASONE) 20 MG tablet  If approved please send to  [facility] [phone] - [place], [place] - [address]

## 2017-09-27 NOTE — Telephone Encounter (Signed)
Appointment made for 10/07/2017

## 2017-09-27 NOTE — Telephone Encounter (Signed)
She needs an office visit 

## 2017-10-03 ENCOUNTER — Telehealth: Payer: Self-pay | Admitting: Family Medicine

## 2017-10-03 NOTE — Telephone Encounter (Signed)
patient called and requested for a refill on listed medication. naproxen (NAPROSYN) 500 MG tablet [[id number] metroNIDAZOLE (FLAGYL) 500 MG tablet  [phone] - [place], [place] - [address] AT [address]

## 2017-10-07 ENCOUNTER — Ambulatory Visit: Payer: Medicaid Other | Admitting: Family Medicine

## 2017-10-09 ENCOUNTER — Ambulatory Visit: Payer: Medicaid Other | Attending: Family Medicine | Admitting: Physician Assistant

## 2017-10-09 ENCOUNTER — Telehealth: Payer: Self-pay | Admitting: Family Medicine

## 2017-10-09 ENCOUNTER — Other Ambulatory Visit (HOSPITAL_COMMUNITY)
Admission: RE | Admit: 2017-10-09 | Discharge: 2017-10-09 | Disposition: A | Discharge status: Home / Self Care | Payer: Medicaid Other | Source: Ambulatory Visit | Attending: Family Medicine | Admitting: Family Medicine

## 2017-10-09 VITALS — BP 150/81 | HR 77 | Temp 98.2°F | Resp 18 | Ht 70.0 in | Wt 356.0 lb

## 2017-10-09 DIAGNOSIS — Z79899 Other long term (current) drug therapy: Secondary | ICD-10-CM | POA: Diagnosis not present

## 2017-10-09 DIAGNOSIS — R5383 Other fatigue: Secondary | ICD-10-CM | POA: Insufficient documentation

## 2017-10-09 DIAGNOSIS — N898 Other specified noninflammatory disorders of vagina: Secondary | ICD-10-CM | POA: Diagnosis not present

## 2017-10-09 DIAGNOSIS — Z7982 Long term (current) use of aspirin: Secondary | ICD-10-CM | POA: Insufficient documentation

## 2017-10-09 DIAGNOSIS — Z131 Encounter for screening for diabetes mellitus: Secondary | ICD-10-CM

## 2017-10-09 DIAGNOSIS — R631 Polydipsia: Secondary | ICD-10-CM | POA: Diagnosis not present

## 2017-10-09 DIAGNOSIS — E559 Vitamin D deficiency, unspecified: Secondary | ICD-10-CM | POA: Insufficient documentation

## 2017-10-09 DIAGNOSIS — N83209 Unspecified ovarian cyst, unspecified side: Secondary | ICD-10-CM | POA: Diagnosis not present

## 2017-10-09 DIAGNOSIS — I10 Essential (primary) hypertension: Secondary | ICD-10-CM | POA: Diagnosis not present

## 2017-10-09 LAB — POCT URINALYSIS DIPSTICK
BILIRUBIN UA: NEGATIVE
Blood, UA: NEGATIVE
Glucose, UA: NEGATIVE
Leukocytes, UA: NEGATIVE
Nitrite, UA: NEGATIVE
PH UA: 6.5 (ref 5.0–8.0)
PROTEIN UA: NEGATIVE
Spec Grav, UA: 1.025 (ref 1.010–1.025)
UROBILINOGEN UA: 0.2 U/dL

## 2017-10-09 LAB — POCT GLYCOSYLATED HEMOGLOBIN (HGB A1C): HEMOGLOBIN A1C: 6.7

## 2017-10-09 MED ORDER — METFORMIN HCL 500 MG PO TABS: 500.0000 mg | ORAL_TABLET | Freq: Two times a day (BID) | ORAL | 3 refills | Status: DC

## 2017-10-09 NOTE — Telephone Encounter (Signed)
Patient stopped by the front desk after office visit, patient requested for naproxen (NAPROSYN) 500 MG tablet [[id number]. Patient stated she has been without it for almost a month and needs the medication. Patient stated if any questionss or concerns please give her a call.  [facility] [phone] - [place], [place] - [address]

## 2017-10-09 NOTE — Progress Notes (Signed)
Patient ID: Nicole GrammesChristy S Rawson, female   DOB: 10/27/1975, 42 y.o.   MRN: [id number]     Nicole DunChristy Pesantez, is a 42 y.o. female  [id number]  [id number]  DOB - 08/04/1975  Subjective:  Chief Complaint and HPI: Nicole [name] is a 42 y.o. female here today for multiple concerns and issues.  Increased thirst and low energy for about 3-4 months.  Also, periods are heavier than normal.  She is also concerned bc of recurrent BV.  She has not had sex in more than 4 years.  +vaginal odor now.    Social:  Has BF but not SA.    ROS:   Constitutional:  No f/c, No night sweats, No unexplained weight loss. EENT:  No vision changes, No blurry vision, No hearing changes. No mouth, throat, or ear problems.  Respiratory: No cough, No SOB Cardiac: No CP, no palpitations GI:  No abd pain, No N/V/D. GU: No Urinary s/sx Musculoskeletal: No joint pain Neuro: No headache, no dizziness, no motor weakness.  Skin: No rash Endocrine:  + polydipsia. No polyuria.  Psych: Denies SI/HI  No problems updated.  ALLERGIES: No Known Allergies  PAST MEDICAL HISTORY: Past Medical History:  Diagnosis Date  . Arthritis    knees  . Hypertension   . Ovarian cyst   . Pregnancy induced hypertension     MEDICATIONS AT HOME: Prior to Admission medications   Medication Sig Start Date End Date Taking? Authorizing Provider  albuterol (PROVENTIL HFA;VENTOLIN HFA) 108 (90 Base) MCG/ACT inhaler Inhale 2 puffs into the lungs every 6 (six) hours as needed for wheezing or shortness of breath. 05/23/16  Yes Linwood DibblesKnapp, Jon, MD  Aspirin-Salicylamide-Caffeine [facility](BC HEADACHE POWDER PO) Take 1 packet by mouth 2 (two) times daily as needed (headaches).   Yes [provider]     Objective:  EXAM:   Vitals:   10/09/17 1406  BP: (!) 150/81  Pulse: 77  Resp: 18  Temp: 98.2 F (36.8 C)  TempSrc: Oral  SpO2: 99%  Weight: (!) 356 lb (161.5 kg)  Height: 5\' 10"  (1.778 m)    General appearance : A&OX3. NAD.  Non-toxic-appearing HEENT: Atraumatic and Normocephalic.  PERRLA. EOM intact.   Neck: supple, no JVD. No cervical lymphadenopathy. No thyromegaly Chest/Lungs:  Breathing-non-labored, Good air entry bilaterally, breath sounds normal without rales, rhonchi, or wheezing  CVS: S1 S2 regular, no murmurs, gallops, rubs  Extremities: Bilateral Lower Ext shows no edema, both legs are warm to touch with = pulse throughout Neurology:  CN II-XII grossly intact, Non focal.   Psych:  TP linear. J/I WNL. Normal speech. Appropriate eye contact and affect.  Skin:  No Rash  Data Review No results found for: HGBA1C  A1C=6.7   Assessment & Plan   1. Fatigue, unspecified type - TSH - Vitamin D, 25-hydroxy - Comprehensive metabolic panel - CBC with Differential/Platelet  2. Vaginal odor BV prevention and hygiene/limiting RF reviewed.  Probiotics and increase water intake, cotton underwear, etc.  Counseled at length.   - Urinalysis Dipstick - Urine cytology ancillary only  3. Hypertension, unspecified type suboptimal control. We have discussed target BP range and blood pressure goal. I have advised patient to check BP regularly and to call us back or report to clinic if the numbers are consistently higher than 140/90. We discussed the importance of compliance with medical therapy and DASH diet recommended, consequences of uncontrolled hypertension discussed.  Continue current medication regimen  4. Screening for diabetes mellitus with positive DM screening -  HgB A1c=6.7.  Will start on metformin 500mg  bid.  Diabetic diet reviewed and encouraged.  Weight loss in imperative.    5. Polydipsia - HgB A1c  6. Vitamin D deficiency - Vitamin D, 25-hydroxy  Patient have been counseled extensively about nutrition and exercise.  I spent more than 30 mins face to face counseling on healthy lifestyle and all concerns discussed.    Return in about 6 weeks (around 11/20/2017) for Dr Newlin-f/up  fatigue/htn.  The patient was given clear instructions to go to ER or return to medical center if symptoms don't improve, worsen or new problems develop. The patient verbalized understanding. The patient was told to call to get lab results if they haven't heard anything in the next week.     Georgian Co, PA-C [facility] [place], [place] [phone]   10/09/2017, 2:38 PM

## 2017-10-09 NOTE — Patient Instructions (Signed)
Diabetes Mellitus and Nutrition When you have diabetes (diabetes mellitus), it is very important to have healthy eating habits because your blood sugar (glucose) levels are greatly affected by what you eat and drink. Eating healthy foods in the appropriate amounts, at about the same times every day, can help you:  Control your blood glucose.  Lower your risk of heart disease.  Improve your blood pressure.  Reach or maintain a healthy weight.  Every person with diabetes is different, and each person has different needs for a meal plan. Your health care provider may recommend that you work with a diet and nutrition specialist (dietitian) to make a meal plan that is best for you. Your meal plan may vary depending on factors such as:  The calories you need.  The medicines you take.  Your weight.  Your blood glucose, blood pressure, and cholesterol levels.  Your activity level.  Other health conditions you have, such as heart or kidney disease.  How do carbohydrates affect me? Carbohydrates affect your blood glucose level more than any other type of food. Eating carbohydrates naturally increases the amount of glucose in your blood. Carbohydrate counting is a method for keeping track of how many carbohydrates you eat. Counting carbohydrates is important to keep your blood glucose at a healthy level, especially if you use insulin or take certain oral diabetes medicines. It is important to know how many carbohydrates you can safely have in each meal. This is different for every person. Your dietitian can help you calculate how many carbohydrates you should have at each meal and for snack. Foods that contain carbohydrates include:  Bread, cereal, rice, pasta, and crackers.  Potatoes and corn.  Peas, beans, and lentils.  Milk and yogurt.  Fruit and juice.  Desserts, such as cakes, cookies, ice cream, and candy.  How does alcohol affect me? Alcohol can cause a sudden decrease in blood  glucose (hypoglycemia), especially if you use insulin or take certain oral diabetes medicines. Hypoglycemia can be a life-threatening condition. Symptoms of hypoglycemia (sleepiness, dizziness, and confusion) are similar to symptoms of having too much alcohol. If your health care provider says that alcohol is safe for you, follow these guidelines:  Limit alcohol intake to no more than 1 drink per day for nonpregnant women and 2 drinks per day for men. One drink equals 12 oz of beer, 5 oz of wine, or 1 oz of hard liquor.  Do not drink on an empty stomach.  Keep yourself hydrated with water, diet soda, or unsweetened iced tea.  Keep in mind that regular soda, juice, and other mixers may contain a lot of sugar and must be counted as carbohydrates.  What are tips for following this plan? Reading food labels  Start by checking the serving size on the label. The amount of calories, carbohydrates, fats, and other nutrients listed on the label are based on one serving of the food. Many foods contain more than one serving per package.  Check the total grams (g) of carbohydrates in one serving. You can calculate the number of servings of carbohydrates in one serving by dividing the total carbohydrates by 15. For example, if a food has 30 g of total carbohydrates, it would be equal to 2 servings of carbohydrates.  Check the number of grams (g) of saturated and trans fats in one serving. Choose foods that have low or no amount of these fats.  Check the number of milligrams (mg) of sodium in one serving. Most people   should limit total sodium intake to less than 2,300 mg per day.  Always check the nutrition information of foods labeled as "low-fat" or "nonfat". These foods may be higher in added sugar or refined carbohydrates and should be avoided.  Talk to your dietitian to identify your daily goals for nutrients listed on the label. Shopping  Avoid buying canned, premade, or processed foods. These  foods tend to be high in fat, sodium, and added sugar.  Shop around the outside edge of the grocery store. This includes fresh fruits and vegetables, bulk grains, fresh meats, and fresh dairy. Cooking  Use low-heat cooking methods, such as baking, instead of high-heat cooking methods like deep frying.  Cook using healthy oils, such as olive, canola, or sunflower oil.  Avoid cooking with butter, cream, or high-fat meats. Meal planning  Eat meals and snacks regularly, preferably at the same times every day. Avoid going long periods of time without eating.  Eat foods high in fiber, such as fresh fruits, vegetables, beans, and whole grains. Talk to your dietitian about how many servings of carbohydrates you can eat at each meal.  Eat 4-6 ounces of lean protein each day, such as lean meat, chicken, fish, eggs, or tofu. 1 ounce is equal to 1 ounce of meat, chicken, or fish, 1 egg, or 1/4 cup of tofu.  Eat some foods each day that contain healthy fats, such as avocado, nuts, seeds, and fish. Lifestyle   Check your blood glucose regularly.  Exercise at least 30 minutes 5 or more days each week, or as told by your health care provider.  Take medicines as told by your health care provider.  Do not use any products that contain nicotine or tobacco, such as cigarettes and e-cigarettes. If you need help quitting, ask your health care provider.  Work with a counselor or diabetes educator to identify strategies to manage stress and any emotional and social challenges. What are some questions to ask my health care provider?  Do I need to meet with a diabetes educator?  Do I need to meet with a dietitian?  What number can I call if I have questions?  When are the best times to check my blood glucose? Where to find more information:  American Diabetes Association: diabetes.org/food-and-fitness/food  Academy of Nutrition and Dietetics:  www.eatright.org/resources/health/diseases-and-conditions/diabetes  National Institute of Diabetes and Digestive and Kidney Diseases (NIH): www.niddk.nih.gov/health-information/diabetes/overview/diet-eating-physical-activity Summary  A healthy meal plan will help you control your blood glucose and maintain a healthy lifestyle.  Working with a diet and nutrition specialist (dietitian) can help you make a meal plan that is best for you.  Keep in mind that carbohydrates and alcohol have immediate effects on your blood glucose levels. It is important to count carbohydrates and to use alcohol carefully. This information is not intended to replace advice given to you by your health care provider. Make sure you discuss any questions you have with your health care provider. Document Released: 04/12/2005 Document Revised: 08/20/2016 Document Reviewed: 08/20/2016 Elsevier Interactive Patient Education  2018 Elsevier Inc.  

## 2017-10-09 NOTE — Telephone Encounter (Signed)
Seen for an OV

## 2017-10-10 LAB — CBC WITH DIFFERENTIAL/PLATELET
BASOS: 0 %
Basophils Absolute: 0 10*3/uL (ref 0.0–0.2)
EOS (ABSOLUTE): 0.2 10*3/uL (ref 0.0–0.4)
EOS: 2 %
HEMATOCRIT: 40.4 % (ref 34.0–46.6)
HEMOGLOBIN: 12.9 g/dL (ref 11.1–15.9)
IMMATURE GRANULOCYTES: 0 %
Immature Grans (Abs): 0 10*3/uL (ref 0.0–0.1)
LYMPHS ABS: 3.2 10*3/uL — AB (ref 0.7–3.1)
Lymphs: 29 %
MCH: 26 pg — ABNORMAL LOW (ref 26.6–33.0)
MCHC: 31.9 g/dL (ref 31.5–35.7)
MCV: 82 fL (ref 79–97)
MONOCYTES: 6 %
Monocytes Absolute: 0.7 10*3/uL (ref 0.1–0.9)
Neutrophils Absolute: 7.1 10*3/uL — ABNORMAL HIGH (ref 1.4–7.0)
Neutrophils: 63 %
Platelets: 260 10*3/uL (ref 150–379)
RBC: 4.96 x10E6/uL (ref 3.77–5.28)
RDW: 14.4 % (ref 12.3–15.4)
WBC: 11.3 10*3/uL — ABNORMAL HIGH (ref 3.4–10.8)

## 2017-10-10 LAB — COMPREHENSIVE METABOLIC PANEL
ALBUMIN: 3.9 g/dL (ref 3.5–5.5)
ALT: 20 IU/L (ref 0–32)
AST: 22 IU/L (ref 0–40)
Albumin/Globulin Ratio: 1.3 (ref 1.2–2.2)
Alkaline Phosphatase: 86 IU/L (ref 39–117)
BUN / CREAT RATIO: 12 (ref 9–23)
BUN: 14 mg/dL (ref 6–24)
Bilirubin Total: 0.2 mg/dL (ref 0.0–1.2)
CALCIUM: 9.2 mg/dL (ref 8.7–10.2)
CO2: 23 mmol/L (ref 20–29)
CREATININE: 1.15 mg/dL — AB (ref 0.57–1.00)
Chloride: 104 mmol/L (ref 96–106)
GFR calc Af Amer: 68 mL/min/{1.73_m2} (ref >59)
GFR, EST NON AFRICAN AMERICAN: 59 mL/min/{1.73_m2} — AB (ref >59)
GLOBULIN, TOTAL: 3.1 g/dL (ref 1.5–4.5)
Glucose: 93 mg/dL (ref 65–99)
Potassium: 4.9 mmol/L (ref 3.5–5.2)
Sodium: 140 mmol/L (ref 134–144)
TOTAL PROTEIN: 7 g/dL (ref 6.0–8.5)

## 2017-10-10 LAB — URINE CYTOLOGY ANCILLARY ONLY
CHLAMYDIA, DNA PROBE: NEGATIVE
Neisseria Gonorrhea: NEGATIVE
Trichomonas: NEGATIVE

## 2017-10-10 LAB — TSH: TSH: 1.26 u[IU]/mL (ref 0.450–4.500)

## 2017-10-10 LAB — VITAMIN D 25 HYDROXY (VIT D DEFICIENCY, FRACTURES): VIT D 25 HYDROXY: 9.2 ng/mL — AB (ref 30.0–100.0)

## 2017-10-11 ENCOUNTER — Telehealth: Payer: Self-pay | Admitting: *Deleted

## 2017-10-11 ENCOUNTER — Other Ambulatory Visit: Payer: Self-pay | Admitting: Physician Assistant

## 2017-10-11 MED ORDER — VITAMIN D (ERGOCALCIFEROL) 1.25 MG (50000 UNIT) PO CAPS: 50000.0000 [IU] | ORAL_CAPSULE | ORAL | 0 refills | Status: DC

## 2017-10-11 NOTE — Telephone Encounter (Signed)
Patient verified DOB Patient is aware of labs being normal including thyroid and negative for any vaginal concerns. Patient is aware of needing vitamin d once a week to address the low level. Patient is aware of following up as planned and a recheck being completed at the next visit. No further questions.

## 2017-10-11 NOTE — Telephone Encounter (Signed)
-----   Message from Anders SimmondsAngela M McClung, [place] sent at 10/11/2017  9:24 AM EDT ----- Please call patient and let them that their vitamin D is low.  This can contribute to muscle aches, anxiety, fatigue, and depression.  I have sent a prescription to the pharmacy for them to take once a week.  We will recheck this level in 3-4 months.  Thyroid studies are normal and urine did not show BV or yeast or STD.  Drink more water to improve kidney function which was a little bit low.  Other labs overall look good.  Follow up as planned. Thanks, Georgian CoAngela McClung, PA-C

## 2017-10-14 LAB — URINE CYTOLOGY ANCILLARY ONLY
Bacterial vaginitis: POSITIVE — AB
Candida vaginitis: NEGATIVE

## 2017-10-16 ENCOUNTER — Other Ambulatory Visit: Payer: Self-pay | Admitting: Physician Assistant

## 2017-10-16 ENCOUNTER — Telehealth: Payer: Self-pay | Admitting: *Deleted

## 2017-10-16 MED ORDER — METRONIDAZOLE 500 MG PO TABS: 500.0000 mg | ORAL_TABLET | Freq: Two times a day (BID) | ORAL | 0 refills | Status: DC

## 2017-10-16 NOTE — Telephone Encounter (Signed)
Patient was informed of results and verbalized understanding 

## 2017-10-16 NOTE — Telephone Encounter (Signed)
-----   Message from Anders SimmondsAngela M McClung, [place] sent at 10/16/2017  8:02 AM EDT ----- Please call patient.  I received additional labs today that DID show Bacterial vaginits.  I sent her a prescription for this to her pharmacy. Thanks, Georgian CoAngela McClung, PA-C

## 2017-10-16 NOTE — Telephone Encounter (Signed)
Medical Assistant left message on patient's home and cell voicemail. Voicemail states to give a call back to [place] [facility] at [phone]. Please inform patient of their vitamin D being is low. This can contribute to muscle aches, anxiety, fatigue, and depression. I have sent a prescription to the pharmacy for them to take once a week. We will recheck this level in 3-4 months. Thyroid studies are normal and urine did show BV and a prescription is at the pharmacy.Urine was negative for yeast or STD. Drink more water to improve kidney function which was a little bit low. Other labs overall look good. Follow up as planned.

## 2017-11-19 ENCOUNTER — Ambulatory Visit: Payer: Medicaid Other | Admitting: Family Medicine

## 2017-11-20 ENCOUNTER — Ambulatory Visit: Payer: Medicaid Other | Admitting: Family Medicine

## 2018-01-08 ENCOUNTER — Encounter: Payer: Self-pay | Admitting: Family Medicine

## 2018-01-08 ENCOUNTER — Other Ambulatory Visit (HOSPITAL_COMMUNITY)
Admission: RE | Admit: 2018-01-08 | Discharge: 2018-01-08 | Disposition: A | Discharge status: Home / Self Care | Payer: Medicaid Other | Source: Ambulatory Visit | Attending: Family Medicine | Admitting: Family Medicine

## 2018-01-08 ENCOUNTER — Ambulatory Visit: Payer: Medicaid Other | Attending: Family Medicine | Admitting: Family Medicine

## 2018-01-08 VITALS — BP 151/90 | HR 62 | Temp 97.4°F | Ht 70.0 in | Wt 356.2 lb

## 2018-01-08 DIAGNOSIS — E119 Type 2 diabetes mellitus without complications: Secondary | ICD-10-CM | POA: Insufficient documentation

## 2018-01-08 DIAGNOSIS — Z79899 Other long term (current) drug therapy: Secondary | ICD-10-CM | POA: Insufficient documentation

## 2018-01-08 DIAGNOSIS — M170 Bilateral primary osteoarthritis of knee: Secondary | ICD-10-CM | POA: Diagnosis not present

## 2018-01-08 DIAGNOSIS — Z6841 Body Mass Index (BMI) 40.0 and over, adult: Secondary | ICD-10-CM | POA: Diagnosis not present

## 2018-01-08 DIAGNOSIS — E559 Vitamin D deficiency, unspecified: Secondary | ICD-10-CM | POA: Insufficient documentation

## 2018-01-08 DIAGNOSIS — R5383 Other fatigue: Secondary | ICD-10-CM | POA: Diagnosis present

## 2018-01-08 DIAGNOSIS — Z7982 Long term (current) use of aspirin: Secondary | ICD-10-CM | POA: Insufficient documentation

## 2018-01-08 DIAGNOSIS — N898 Other specified noninflammatory disorders of vagina: Secondary | ICD-10-CM | POA: Diagnosis not present

## 2018-01-08 DIAGNOSIS — I10 Essential (primary) hypertension: Secondary | ICD-10-CM | POA: Insufficient documentation

## 2018-01-08 DIAGNOSIS — Z7984 Long term (current) use of oral hypoglycemic drugs: Secondary | ICD-10-CM | POA: Insufficient documentation

## 2018-01-08 DIAGNOSIS — M1990 Unspecified osteoarthritis, unspecified site: Secondary | ICD-10-CM | POA: Insufficient documentation

## 2018-01-08 HISTORY — DX: Type 2 diabetes mellitus without complications: E11.9

## 2018-01-08 LAB — POCT GLYCOSYLATED HEMOGLOBIN (HGB A1C): HBA1C, POC (CONTROLLED DIABETIC RANGE): 6.9 % (ref 0.0–7.0)

## 2018-01-08 MED ORDER — EXENATIDE ER 2 MG ~~LOC~~ PEN: 2.0000 mg | PEN_INJECTOR | SUBCUTANEOUS | 3 refills | Status: DC

## 2018-01-08 MED ORDER — ACCU-CHEK AVIVA DEVI: 0 refills | Status: DC

## 2018-01-08 MED ORDER — NAPROXEN 500 MG PO TABS: 500.0000 mg | ORAL_TABLET | Freq: Two times a day (BID) | ORAL | 2 refills | Status: DC

## 2018-01-08 MED ORDER — GLUCOSE BLOOD VI STRP: ORAL_STRIP | 12 refills | Status: DC

## 2018-01-08 MED ORDER — ACCU-CHEK SOFTCLIX LANCET DEV KIT: 1.0000 | PACK | Freq: Every day | 12 refills | Status: DC

## 2018-01-08 NOTE — Progress Notes (Signed)
Subjective:  Patient ID: Nicole [name], female    DOB: Feb 25, 1976  Age: 42 y.o. MRN: [id number]  CC: Hypertension and Fatigue   HPI Nicole [name]  is a 42 year old female with morbid obesity, Type 2 DM (A1c 6.9), osteoarthritis of the knee here for a follow up visit. She complains of vaginal itching but no discharge, no urinary symptoms. 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 Complains of Metformin causing diarrhea and so she discontinued it.She does not exercise regularly and is not adherent with a Diabetic diet. She has no other concerns today. Her Hypertension is diet controlled.   Past Medical History:  Diagnosis Date  . Arthritis    knees  . Hypertension   . Ovarian cyst   . Pregnancy induced hypertension     Past Surgical History:  Procedure Laterality Date  . APPENDECTOMY    . CESAREAN SECTION    . HERNIA REPAIR    . LAPAROSCOPIC APPENDECTOMY N/A 03/25/2014   Procedure: APPENDECTOMY LAPAROSCOPIC;  Surgeon: Gwenyth Ober, MD;  Location: [facility];  Service: General;  Laterality: N/A;     Outpatient Medications Prior to Visit  Medication Sig Dispense Refill  . albuterol (PROVENTIL HFA;VENTOLIN HFA) 108 (90 Base) MCG/ACT inhaler Inhale 2 puffs into the lungs every 6 (six) hours as needed for wheezing or shortness of breath. (Patient not taking: Reported on 01/08/2018) 1 Inhaler 2  . Aspirin-Salicylamide-Caffeine (BC HEADACHE POWDER PO) Take 1 packet by mouth 2 (two) times daily as needed (headaches).    . metroNIDAZOLE (FLAGYL) 500 MG tablet Take 1 tablet (500 mg total) by mouth 2 (two) times daily. (Patient not taking: Reported on 01/08/2018) 14 tablet 0  . Vitamin D, Ergocalciferol, (DRISDOL) 50000 units CAPS capsule Take 1 capsule (50,000 Units total) by mouth every 7 (seven) days. (Patient not taking: Reported on 01/08/2018) 16 capsule 0  . metFORMIN (GLUCOPHAGE) 500 MG tablet Take 1 tablet (500 mg total) by  mouth 2 (two) times daily with a meal. (Patient not taking: Reported on 01/08/2018) 180 tablet 3   No facility-administered medications prior to visit.     ROS Review of Systems  Constitutional: Negative for activity change, appetite change and fatigue.  HENT: Negative for congestion, sinus pressure and sore throat.   Eyes: Negative for visual disturbance.  Respiratory: Negative for cough, chest tightness, shortness of breath and wheezing.   Cardiovascular: Negative for chest pain and palpitations.  Gastrointestinal: Negative for abdominal distention, abdominal pain and constipation.  Endocrine: Negative for polydipsia.  Genitourinary: Negative for dysuria and frequency.  Musculoskeletal: Negative for arthralgias and back pain.  Skin: Negative for rash.  Neurological: Negative for tremors, light-headedness and numbness.  Hematological: Does not bruise/bleed easily.  Psychiatric/Behavioral: Negative for agitation and behavioral problems.    Objective:  BP (!) 151/90   Pulse 62   Temp (!) 97.4 F (36.3 C) (Oral)   Ht _0  (1.778 m)   Wt (!) 356 lb 3.2 oz (161.6 kg)   SpO2 100%   BMI 51.11 kg/m   BP/Weight 01/08/2018 10/09/2017 11/08/8784  Systolic BP 767 209 470  Diastolic BP 90 81 83  Wt. (Lbs) 356.2 356 349  BMI 51.11 51.08 53.07      Physical Exam  Constitutional: She is oriented to person, place, and time. She appears well-developed and well-nourished.  Morbidly Obese  Cardiovascular: Normal rate, normal heart sounds and intact distal pulses.  No  murmur heard. Pulmonary/Chest: Effort normal and breath sounds normal. She has no wheezes. She has no rales. She exhibits no tenderness.  Abdominal: Soft. Bowel sounds are normal. She exhibits no distension and no mass. There is no tenderness.  Musculoskeletal: Normal range of motion.  Neurological: She is alert and oriented to person, place, and time.  Skin: Skin is warm and dry.  Psychiatric: She has a normal mood and  affect.     Lab Results  Component Value Date   HGBA1C 6.9 01/08/2018    Assessment & Plan:   1. Type 2 diabetes mellitus without complication, without long-term current use of insulin (HCC) Controlled D/c metformin due to Diarrhea Commence Bydureon which will also help with weight loss Counseled on Diabetic diet, my plate method, 676 minutes of moderate intensity exercise/week Keep blood sugar logs with fasting goals of 80-120 mg/dl, random of less than 180 and in the event of sugars less than 60 mg/dl or greater than 400 mg/dl please notify the clinic ASAP. It is recommended that you undergo annual eye exams and annual foot exams. Pneumonia vaccine is recommended. - Blood Glucose Monitoring Suppl (ACCU-CHEK AVIVA) device; Use as instructed daily.  Dispense: 1 each; Refill: 0 - glucose blood (ACCU-CHEK AVIVA) test strip; Use daily  Dispense: 30 each; Refill: 12 - Lancets Misc. (ACCU-CHEK SOFTCLIX LANCET DEV) KIT; 1 each by Does not apply route daily.  Dispense: 1 kit; Refill: 12 - Exenatide ER (BYDUREON) 2 MG PEN; Inject 2 mg into the skin once a week.  Dispense: 4 each; Refill: 3 - POCT glycosylated hemoglobin (Hb A1C)  2. Vitamin D deficiency - VITAMIN D 25 Hydroxy (Vit-D Deficiency, Fractures)  3. Hypertension, unspecified type Diet controlled  4. Morbid obesity ([place]) Discussed reducing portion sizes, exercising  5. Primary osteoarthritis of both knees Stable Advised weight loss will be beneficial - naproxen (NAPROSYN) 500 MG tablet; Take 1 tablet (500 mg total) by mouth 2 (two) times daily with a meal.  Dispense: 30 tablet; Refill: 2  6. Vaginal itching - Urine cytology ancillary only   Meds ordered this encounter  Medications  . Blood Glucose Monitoring Suppl (ACCU-CHEK AVIVA) device    Sig: Use as instructed daily.    Dispense:  1 each    Refill:  0  . glucose blood (ACCU-CHEK AVIVA) test strip    Sig: Use daily    Dispense:  30 each    Refill:  12  .  Lancets Misc. (ACCU-CHEK SOFTCLIX LANCET DEV) KIT    Sig: 1 each by Does not apply route daily.    Dispense:  1 kit    Refill:  12  . naproxen (NAPROSYN) 500 MG tablet    Sig: Take 1 tablet (500 mg total) by mouth 2 (two) times daily with a meal.    Dispense:  30 tablet    Refill:  2  . Exenatide ER (BYDUREON) 2 MG PEN    Sig: Inject 2 mg into the skin once a week.    Dispense:  4 each    Refill:  3    Failed Metformin    Follow-up: Return in about 3 months (around 04/10/2018) for Follow-up of chronic medical conditions.   [name] MD

## 2018-01-08 NOTE — Patient Instructions (Signed)
Exenatide injection suspension, extended-release What is this medicine? EXENATIDE (ex EN a tide) is used to improve blood sugar control in adults with type 2 diabetes. This medicine may be used with other oral diabetes medicines. This medicine may be used for other purposes; ask your health care provider or pharmacist if you have questions. COMMON BRAND NAME(S): Bydureon, Bydureon BCise What should I tell my health care provider before I take this medicine? They need to know if you have any of these conditions: -endocrine tumors (MEN 2) or if someone in your family had these tumors -history of pancreatitis -kidney disease or if you are on dialysis -stomach or intestine problems -thyroid cancer or if someone in your family had thyroid cancer -an unusual or allergic reaction to exenatide, medicines, foods, dyes, or preservatives -pregnant or trying to get pregnant -breast-feeding How should I use this medicine? This medicine is for injection under the skin of your upper leg, stomach area, or upper arm. It is usually given once every week (every 7 days). You will be taught how to prepare and give this medicine. Use exactly as directed. Take your medicine at regular intervals. Do not take it more often than directed. It is important that you put your used needles and syringes in a special sharps container. Do not put them in a trash can. If you do not have a sharps container, call your pharmacist or healthcare provider to get one. A special MedGuide will be given to you by the pharmacist with each prescription and refill. Be sure to read this information carefully each time. Talk to your pediatrician regarding the use of this medicine in children. Special care may be needed. Overdosage: If you think you have taken too much of this medicine contact a poison control center or emergency room at once. NOTE: This medicine is only for you. Do not share this medicine with others. What if I miss a  dose? If you miss a dose, take it as soon as you can, provided your next usual scheduled dose is due at least 3 days later. If you miss a dose and your next usual scheduled dose is due 1 or 2 days later, then do not take the missed dose. Take the next dose at your regular time. Do not take double or extra doses. If you have questions about a missed dose, contact your health care provider for advice. What may interact with this medicine? -acetaminophen -birth control pills -digoxin -insulin and other medicines for diabetes -lisinopril -lovastatin -warfarin Many medications may cause changes in blood sugar, these include: -alcohol containing beverages -antiviral medicines for HIV or AIDS -aspirin and aspirin-like drugs -certain medicines for blood pressure, heart disease, irregular heart beat -chromium -diuretics -female hormones, such as estrogens or progestins, birth control pills -fenofibrate -gemfibrozil -isoniazid -lanreotide -female hormones or anabolic steroids -MAOIs like Carbex, Eldepryl, Marplan, Nardil, and Parnate -medicines for weight loss -medicines for allergies, asthma, cold, or cough -medicines for depression, anxiety, or psychotic disturbances -niacin -nicotine -NSAIDs, medicines for pain and inflammation, like ibuprofen or naproxen -octreotide -pasireotide -pentamidine -phenytoin -probenecid -quinolone antibiotics such as ciprofloxacin, levofloxacin, ofloxacin -some herbal dietary supplements -steroid medicines such as prednisone or cortisone -sulfamethoxazole; trimethoprim -thyroid hormones Some medications can hide the warning symptoms of low blood sugar (hypoglycemia). You may need to monitor your blood sugar more closely if you are taking one of these medications. These include: -beta-blockers, often used for high blood pressure or heart problems (examples include atenolol, metoprolol, propranolol) -clonidine -guanethidine -reserpine   This list may not  describe all possible interactions. Give your health care provider a list of all the medicines, herbs, non-prescription drugs, or dietary supplements you use. Also tell them if you smoke, drink alcohol, or use illegal drugs. Some items may interact with your medicine. What should I watch for while using this medicine? Visit your doctor or health care professional for regular checks on your progress. A test called the HbA1C (A1C) will be monitored. This is a simple blood test. It measures your blood sugar control over the last 2 to 3 months. You will receive this test every 3 to 6 months. Learn how to check your blood sugar. Learn the symptoms of low and high blood sugar and how to manage them. Always carry a quick-source of sugar with you in case you have symptoms of low blood sugar. Examples include hard sugar candy or glucose tablets. Make sure others know that you can choke if you eat or drink when you develop serious symptoms of low blood sugar, such as seizures or unconsciousness. They must get medical help at once. Tell your doctor or health care professional if you have high blood sugar. You might need to change the dose of your medicine. If you are sick or exercising more than usual, you might need to change the dose of your medicine. Do not skip meals. Ask your doctor or health care professional if you should avoid alcohol. Many nonprescription cough and cold products contain sugar or alcohol. These can affect blood sugar. Pens should never be shared. Even if the needle is changed, sharing may result in passing of viruses like hepatitis or HIV. Wear a medical ID bracelet or chain, and carry a card that describes your disease and details of your medicine and dosage times. What side effects may I notice from receiving this medicine? Side effects that you should report to your doctor or health care professional as soon as possible: -allergic reactions like skin rash, itching or hives, swelling of  the face, lips, or tongue -breathing problems -diarrhea that continues or is severe -lump or swelling on the neck -severe nausea -signs and symptoms of low blood sugar such as feeling anxious, confusion, dizziness, increased hunger, unusually weak or tired, sweating, shakiness, cold, irritable, headache, blurred vision, fast heartbeat, loss of consciousness -signs and symptoms of kidney injury like trouble passing urine or change in the amount of urine -trouble swallowing -unusual stomach upset or pain -vomiting Side effects that usually do not require medical attention (report these to your doctor or health care professional if they continue or are bothersome): -constipation -diarrhea -dizziness -headache -nausea -pain, redness, or irritation at site where injected -stomach upset This list may not describe all possible side effects. Call your doctor for medical advice about side effects. You may report side effects to FDA at 1-800-FDA-1088. Where should I keep my medicine? Keep out of the reach of children. Store this medicine in a refrigerator between 2 and 8 degrees C (36 and 46 degrees F). Do not freeze. Do not use if the medicine has been frozen. Protect from light and excessive heat. Each single-dose tray can be kept at a room temperature not to exceed 25 degrees C (77 degrees F) for no more than a total of 4 weeks, if needed. Throw away any unused medicine after the expiration date on the label. NOTE: This sheet is a summary. It may not cover all possible information. If you have questions about this medicine, talk to your doctor,   pharmacist, or health care provider.  2018 Elsevier/Gold Standard (2016-08-03 17:14:47)  

## 2018-01-09 ENCOUNTER — Other Ambulatory Visit: Payer: Self-pay | Admitting: Family Medicine

## 2018-01-09 LAB — URINE CYTOLOGY ANCILLARY ONLY
Chlamydia: NEGATIVE
Neisseria Gonorrhea: NEGATIVE
TRICH (WINDOWPATH): NEGATIVE

## 2018-01-09 LAB — VITAMIN D 25 HYDROXY (VIT D DEFICIENCY, FRACTURES): VIT D 25 HYDROXY: 17.1 ng/mL — AB (ref 30.0–100.0)

## 2018-01-09 MED ORDER — VITAMIN D (ERGOCALCIFEROL) 1.25 MG (50000 UNIT) PO CAPS: 50000.0000 [IU] | ORAL_CAPSULE | ORAL | 0 refills | Status: DC

## 2018-01-10 ENCOUNTER — Encounter: Payer: Self-pay | Admitting: Family Medicine

## 2018-01-10 ENCOUNTER — Telehealth: Payer: Self-pay

## 2018-01-10 NOTE — Telephone Encounter (Signed)
Patient was called and informed of lab results. 

## 2018-01-13 LAB — URINE CYTOLOGY ANCILLARY ONLY
Bacterial vaginitis: NEGATIVE
Candida vaginitis: NEGATIVE

## 2018-01-17 ENCOUNTER — Telehealth: Payer: Self-pay

## 2018-01-17 NOTE — Telephone Encounter (Signed)
Patient was called and informed to contact office for lab results.   If patient returns phone call please inform patient that all urine results are negative, but Vit D is low and [facility]

## 2018-01-17 NOTE — Telephone Encounter (Signed)
Patient returned call and was informed of or urine results and Rx being sent to her pharmacy for Vit D

## 2018-04-14 ENCOUNTER — Ambulatory Visit: Payer: Medicaid Other | Admitting: Family Medicine

## 2018-04-16 ENCOUNTER — Ambulatory Visit (HOSPITAL_BASED_OUTPATIENT_CLINIC_OR_DEPARTMENT_OTHER): Payer: Medicaid Other | Admitting: Physician Assistant

## 2018-04-16 ENCOUNTER — Ambulatory Visit (HOSPITAL_COMMUNITY)
Admission: RE | Admit: 2018-04-16 | Discharge: 2018-04-16 | Disposition: A | Discharge status: Home / Self Care | Payer: Medicaid Other | Source: Ambulatory Visit | Attending: Family Medicine | Admitting: Family Medicine

## 2018-04-16 ENCOUNTER — Other Ambulatory Visit: Payer: Self-pay

## 2018-04-16 VITALS — BP 143/91 | HR 77 | Temp 98.3°F | Resp 12 | Ht 68.0 in | Wt 347.0 lb

## 2018-04-16 DIAGNOSIS — I10 Essential (primary) hypertension: Secondary | ICD-10-CM | POA: Insufficient documentation

## 2018-04-16 DIAGNOSIS — G8929 Other chronic pain: Secondary | ICD-10-CM | POA: Diagnosis not present

## 2018-04-16 DIAGNOSIS — E119 Type 2 diabetes mellitus without complications: Secondary | ICD-10-CM | POA: Insufficient documentation

## 2018-04-16 DIAGNOSIS — M25461 Effusion, right knee: Secondary | ICD-10-CM | POA: Diagnosis not present

## 2018-04-16 DIAGNOSIS — M25561 Pain in right knee: Secondary | ICD-10-CM

## 2018-04-16 DIAGNOSIS — M1711 Unilateral primary osteoarthritis, right knee: Secondary | ICD-10-CM | POA: Diagnosis not present

## 2018-04-16 DIAGNOSIS — Z79899 Other long term (current) drug therapy: Secondary | ICD-10-CM | POA: Insufficient documentation

## 2018-04-16 DIAGNOSIS — M5136 Other intervertebral disc degeneration, lumbar region: Secondary | ICD-10-CM | POA: Insufficient documentation

## 2018-04-16 DIAGNOSIS — M545 Low back pain, unspecified: Secondary | ICD-10-CM

## 2018-04-16 DIAGNOSIS — N898 Other specified noninflammatory disorders of vagina: Secondary | ICD-10-CM | POA: Insufficient documentation

## 2018-04-16 DIAGNOSIS — R109 Unspecified abdominal pain: Secondary | ICD-10-CM

## 2018-04-16 LAB — POCT URINALYSIS DIP (CLINITEK)
BILIRUBIN UA: NEGATIVE
Glucose, UA: NEGATIVE mg/dL
Ketones, POC UA: NEGATIVE mg/dL
LEUKOCYTES UA: NEGATIVE
Nitrite, UA: NEGATIVE
PH UA: 5.5 (ref 5.0–8.0)
PROTEIN: NEGATIVE
Spec Grav, UA: >=1.03 — AB (ref 1.010–1.025)
Urobilinogen, UA: NEGATIVE E.U./dL — AB

## 2018-04-16 LAB — GLUCOSE, POCT (MANUAL RESULT ENTRY): POC GLUCOSE: 114 mg/dL — AB (ref 70–99)

## 2018-04-16 MED ORDER — NAPROXEN 500 MG PO TABS: 500.0000 mg | ORAL_TABLET | Freq: Two times a day (BID) | ORAL | 2 refills | Status: DC

## 2018-04-16 MED ORDER — METHOCARBAMOL 500 MG PO TABS: 1000.0000 mg | ORAL_TABLET | Freq: Three times a day (TID) | ORAL | 0 refills | Status: DC

## 2018-04-16 NOTE — Progress Notes (Signed)
BP Lower back pressure 10/10  Concerns for BV. Denies sexual activity. Irritation and odor.

## 2018-04-16 NOTE — Progress Notes (Signed)
Patient ID: Nicole [name], female   DOB: [birth year], 42 y.o.   MRN: [id number]   Nicole [name], is a 42 y.o. female  XYI:[id number]  [id number]:[id number]  DOB - 1976-03-01  Subjective:  Chief Complaint and HPI: Nicole [name] is a 42 y.o. female here today for pain in Lower back across B sides but worse on the L for about 5 days.  NKI.  No travel or sleeping in new bed/new pillows.  Pain at times radiates into L flank.  Pain is dull and constant with intermittent worsening of symptoms.  Naproxen helped some.  No dysuria/frequency.  No nausea/vomiting.  No abdominal pain.  "feels different than when I've had a pulled muscle."  Just finished her period.    Also c/o vaginal d/c and odor.  Not SA for a while.  No pelvic pain.    ROS:   Constitutional:  No f/c, No night sweats, No unexplained weight loss. EENT:  No vision changes, No blurry vision, No hearing changes. No mouth, throat, or ear problems.  Respiratory: No cough, No SOB Cardiac: No CP, no palpitations GI:  No abd pain, No N/V/D. GU: No Urinary s/sx Musculoskeletal: +back pain Neuro: No headache, no dizziness, no motor weakness.  Skin: No rash Endocrine:  No polydipsia. No polyuria.  Psych: Denies SI/HI  No problems updated.  ALLERGIES: No Known Allergies  PAST MEDICAL HISTORY: Past Medical History:  Diagnosis Date  . Arthritis    knees  . Hypertension   . Ovarian cyst   . Pregnancy induced hypertension     MEDICATIONS AT HOME: Prior to Admission medications   Medication Sig Start Date End Date Taking? Authorizing Provider  albuterol (PROVENTIL HFA;VENTOLIN HFA) 108 (90 Base) MCG/ACT inhaler Inhale 2 puffs into the lungs every 6 (six) hours as needed for wheezing or shortness of breath. 05/23/16  Yes Dorie Rank, MD  Aspirin-Salicylamide-Caffeine [facility] HEADACHE POWDER PO) Take 1 packet by mouth 2 (two) times daily as needed (headaches).   Yes [provider]  naproxen (NAPROSYN) 500 MG tablet Take 1 tablet (500 mg  total) by mouth 2 (two) times daily with a meal. X 7 days then prn pain 04/16/18  Yes McClung, Angela M, PA-C  Blood Glucose Monitoring Suppl (ACCU-CHEK AVIVA) device Use as instructed daily. 01/08/18   [name], MD  Exenatide ER (BYDUREON) 2 MG PEN Inject 2 mg into the skin once a week. Patient not taking: Reported on 04/16/2018 01/08/18   [name], MD  glucose blood (ACCU-CHEK AVIVA) test strip Use daily 01/08/18   [name], MD  Lancets Misc. (ACCU-CHEK SOFTCLIX LANCET DEV) KIT 1 each by Does not apply route daily. 01/08/18   [name], MD  methocarbamol (ROBAXIN) 500 MG tablet Take 2 tablets (1,000 mg total) by mouth 3 (three) times daily. X 7 days the prn muscle spasm 04/16/18   Argentina Donovan, PA-C  metroNIDAZOLE (FLAGYL) 500 MG tablet Take 1 tablet (500 mg total) by mouth 2 (two) times daily. Patient not taking: Reported on 01/08/2018 10/16/17   Argentina Donovan, PA-C  Vitamin D, Ergocalciferol, (DRISDOL) 50000 units CAPS capsule Take 1 capsule (50,000 Units total) by mouth every 7 (seven) days. Patient not taking: Reported on 04/16/2018 01/09/18   [name], MD     Objective:  EXAM:   Vitals:   04/16/18 1610  BP: (!) 143/91  Pulse: 77  Resp: 12  Temp: 98.3 F (36.8 C)  TempSrc: Oral  SpO2: 96%  Weight: (!) 347 lb (  157.4 kg)  Height: _0  (1.727 m)    General appearance : A&OX3. NAD. Non-toxic-appearing HEENT: Atraumatic and Normocephalic.  PERRLA. EOM intact.  Neck: supple, no JVD. No cervical lymphadenopathy. No thyromegaly Chest/Lungs:  Breathing-non-labored, Good air entry bilaterally, breath sounds normal without rales, rhonchi, or wheezing  CVS: S1 S2 regular, no murmurs, gallops, rubs  Back-no spiny TTP.  B paraspinus spasm.  Neg SLR B.  DTR LE=B.   Extremities: Bilateral Lower Ext shows no edema, both legs are warm to touch with = pulse throughout Neurology:  CN II-XII grossly intact, Non focal.   Psych:  TP linear. J/I WNL. Normal speech.  Appropriate eye contact and affect.  Skin:  No Rash  Data Review Lab Results  Component Value Date   HGBA1C 6.9 01/08/2018   HGBA1C 6.7 10/09/2017     Assessment & Plan   1. Acute bilateral low back pain without sciatica No red flags. Likely musculoskeletal  - DG Lumbar Spine Complete; Future - methocarbamol (ROBAXIN) 500 MG tablet; Take 2 tablets (1,000 mg total) by mouth 3 (three) times daily. X 7 days the prn muscle spasm  Dispense: 90 tablet; Refill: 0 - naproxen (NAPROSYN) 500 MG tablet; Take 1 tablet (500 mg total) by mouth 2 (two) times daily with a meal. X 7 days then prn pain  Dispense: 30 tablet; Refill: 2  2. Type 2 diabetes mellitus without complication, without long-term current use of insulin ([place]) Work on diabetic diet.  Continue current regimen - Glucose (CBG)  3. Flank pain R/O nephrolithiasis.  Will add flomax if needed - POCT URINALYSIS DIP (CLINITEK) - DG Abd 1 View; Future  4. Vaginal discharge Not SA, low risk for STD - Urine cytology ancillary only  Patient have been counseled extensively about nutrition and exercise  Return in about 1 month (around 05/16/2018) for Dr Margarita Rana for DM and other health issues.  The patient was given clear instructions to go to ER or return to medical center if symptoms don't improve, worsen or new problems develop. The patient verbalized understanding. The patient was told to call to get lab results if they haven't heard anything in the next week.     Freeman Caldron, PA-C [facility] [place], [place]   04/16/2018, 5:03 PM

## 2018-04-18 ENCOUNTER — Telehealth: Payer: Self-pay | Admitting: *Deleted

## 2018-04-18 MED ORDER — TRAMADOL HCL 50 MG PO TABS: 50.0000 mg | ORAL_TABLET | Freq: Two times a day (BID) | ORAL | 0 refills | Status: DC | PRN

## 2018-04-18 NOTE — Telephone Encounter (Signed)
Pt informed of results of XR. She states she is still in pain. Medication she is taking is not help. Request for stronger medication.  Please advise.   Notes recorded by Anders SimmondsMcClung, Angela M, PA-C on 04/17/2018 at 9:56 AM EDT Please call patient. No kidney stones were seen. Low back xrays shows arthritis type changes but no dangerous conditions. Take meds and f/up as planned. To ED if worsens. Thanks, Georgian CoAngela McClung PA-C

## 2018-04-18 NOTE — Telephone Encounter (Signed)
Attempt to call patient to inform that medication is available at the pharmacy.

## 2018-04-18 NOTE — Telephone Encounter (Signed)
I have sent a rx for Tramadol to her pharmacy

## 2018-04-24 NOTE — Telephone Encounter (Signed)
Contacted patient and left voicemail.  Please inform patient to do a drop off urine specimen, as soon as possible, for lab that has not been resulted.

## 2018-05-22 ENCOUNTER — Ambulatory Visit: Payer: Medicaid Other | Admitting: Family Medicine

## 2018-06-04 ENCOUNTER — Other Ambulatory Visit: Payer: Self-pay | Admitting: Pharmacist

## 2018-06-04 DIAGNOSIS — E119 Type 2 diabetes mellitus without complications: Secondary | ICD-10-CM

## 2018-06-04 NOTE — Progress Notes (Signed)
lipid

## 2018-06-11 ENCOUNTER — Ambulatory Visit (HOSPITAL_BASED_OUTPATIENT_CLINIC_OR_DEPARTMENT_OTHER): Payer: Medicaid Other | Admitting: Licensed Clinical Social Worker

## 2018-06-11 ENCOUNTER — Encounter: Payer: Self-pay | Admitting: Family Medicine

## 2018-06-11 ENCOUNTER — Ambulatory Visit: Payer: Medicaid Other | Attending: Family Medicine | Admitting: Family Medicine

## 2018-06-11 VITALS — BP 143/80 | HR 64 | Temp 98.1°F | Ht 68.0 in | Wt 346.6 lb

## 2018-06-11 DIAGNOSIS — F329 Major depressive disorder, single episode, unspecified: Secondary | ICD-10-CM | POA: Insufficient documentation

## 2018-06-11 DIAGNOSIS — M549 Dorsalgia, unspecified: Secondary | ICD-10-CM | POA: Insufficient documentation

## 2018-06-11 DIAGNOSIS — Z79899 Other long term (current) drug therapy: Secondary | ICD-10-CM | POA: Diagnosis not present

## 2018-06-11 DIAGNOSIS — Z30011 Encounter for initial prescription of contraceptive pills: Secondary | ICD-10-CM

## 2018-06-11 DIAGNOSIS — M1710 Unilateral primary osteoarthritis, unspecified knee: Secondary | ICD-10-CM | POA: Diagnosis not present

## 2018-06-11 DIAGNOSIS — Z30019 Encounter for initial prescription of contraceptives, unspecified: Secondary | ICD-10-CM | POA: Diagnosis not present

## 2018-06-11 DIAGNOSIS — G8929 Other chronic pain: Secondary | ICD-10-CM | POA: Insufficient documentation

## 2018-06-11 DIAGNOSIS — F419 Anxiety disorder, unspecified: Secondary | ICD-10-CM

## 2018-06-11 DIAGNOSIS — Z7982 Long term (current) use of aspirin: Secondary | ICD-10-CM | POA: Diagnosis not present

## 2018-06-11 DIAGNOSIS — I10 Essential (primary) hypertension: Secondary | ICD-10-CM | POA: Diagnosis not present

## 2018-06-11 DIAGNOSIS — E119 Type 2 diabetes mellitus without complications: Secondary | ICD-10-CM | POA: Diagnosis not present

## 2018-06-11 DIAGNOSIS — Z87891 Personal history of nicotine dependence: Secondary | ICD-10-CM | POA: Insufficient documentation

## 2018-06-11 DIAGNOSIS — Z6841 Body Mass Index (BMI) 40.0 and over, adult: Secondary | ICD-10-CM | POA: Insufficient documentation

## 2018-06-11 DIAGNOSIS — F331 Major depressive disorder, recurrent, moderate: Secondary | ICD-10-CM

## 2018-06-11 LAB — POCT GLYCOSYLATED HEMOGLOBIN (HGB A1C): HbA1c, POC (controlled diabetic range): 6.2 % (ref 0.0–7.0)

## 2018-06-11 LAB — GLUCOSE, POCT (MANUAL RESULT ENTRY): POC Glucose: 103 mg/dl — AB (ref 70–99)

## 2018-06-11 MED ORDER — NORGESTIMATE-ETH ESTRADIOL 0.25-35 MG-MCG PO TABS: 1.0000 | ORAL_TABLET | Freq: Every day | ORAL | 11 refills | Status: DC

## 2018-06-11 MED ORDER — ATORVASTATIN CALCIUM 20 MG PO TABS: 20.0000 mg | ORAL_TABLET | Freq: Every day | ORAL | 3 refills | Status: DC

## 2018-06-11 MED ORDER — EXENATIDE ER 2 MG ~~LOC~~ PEN: 2.0000 mg | PEN_INJECTOR | SUBCUTANEOUS | 3 refills | Status: DC

## 2018-06-11 MED ORDER — HYDROXYZINE HCL 10 MG PO TABS: 10.0000 mg | ORAL_TABLET | Freq: Two times a day (BID) | ORAL | 3 refills | Status: DC | PRN

## 2018-06-11 NOTE — Patient Instructions (Signed)

## 2018-06-11 NOTE — Progress Notes (Signed)
Subjective:  Patient ID: Nicole [name], female    DOB: Nov 04, 1975  Age: 42 y.o. MRN: [id number]  CC: Diabetes   HPI Nicole [name] is a 42 year old female with morbid obesity, Type 2 DM (A1c 6.2), osteoarthritis of the knee here for a follow up visit. She is interested in commencing birth control pills and has tried them in the past.  We discussed the risks and benefits of this especially movement of both 34 but she informs me she quit smoking last month. Her A1c is 6.2 which is down from 6.9 previously after she was placed on Bydureon but she informs me she discontinued this once she ran out of refills.  Of note she has lost 10 pounds.  Denies numbness in extremities, visual concerns. She complains of chronic back pain which sometimes radiates down her lower extremities bilaterally and has not been controlled on Robaxin.  She received this at the visit with the PA and states that even taking one 500 mg tablet makes her sedated and have a hangover. She complains of Anxiety which occur all the time and are mostly at night. Symptoms were previously relieved by taking hot showers but no longer are. She endorses Depression, sadness, crying a lot but no suicidal ideations or intents. She has had some underlying stressors as well. She declines using an SSRI 'as she has heard about them on TV and they are test drugs'.  Past Medical History:  Diagnosis Date  . Arthritis    knees  . Hypertension   . Ovarian cyst   . Pregnancy induced hypertension     Past Surgical History:  Procedure Laterality Date  . APPENDECTOMY    . CESAREAN SECTION    . HERNIA REPAIR    . LAPAROSCOPIC APPENDECTOMY N/A 03/25/2014   Procedure: APPENDECTOMY LAPAROSCOPIC;  Surgeon: Gwenyth Ober, MD;  Location: [place];  Service: General;  Laterality: N/A;    No Known Allergies   Outpatient Medications Prior to Visit  Medication Sig Dispense Refill  . Aspirin-Salicylamide-Caffeine (BC HEADACHE POWDER PO) Take 1 packet  by mouth 2 (two) times daily as needed (headaches).    . Blood Glucose Monitoring Suppl (ACCU-CHEK AVIVA) device Use as instructed daily. 1 each 0  . glucose blood (ACCU-CHEK AVIVA) test strip Use daily 30 each 12  . Lancets Misc. (ACCU-CHEK SOFTCLIX LANCET DEV) KIT 1 each by Does not apply route daily. 1 kit 12  . methocarbamol (ROBAXIN) 500 MG tablet Take 2 tablets (1,000 mg total) by mouth 3 (three) times daily. X 7 days the prn muscle spasm 90 tablet 0  . metroNIDAZOLE (FLAGYL) 500 MG tablet Take 1 tablet (500 mg total) by mouth 2 (two) times daily. 14 tablet 0  . naproxen (NAPROSYN) 500 MG tablet Take 1 tablet (500 mg total) by mouth 2 (two) times daily with a meal. X 7 days then prn pain 30 tablet 2  . traMADol (ULTRAM) 50 MG tablet Take 1 tablet (50 mg total) by mouth every 12 (twelve) hours as needed. 14 tablet 0  . Vitamin D, Ergocalciferol, (DRISDOL) 50000 units CAPS capsule Take 1 capsule (50,000 Units total) by mouth every 7 (seven) days. 16 capsule 0  . Exenatide ER (BYDUREON) 2 MG PEN Inject 2 mg into the skin once a week. 4 each 3  . albuterol (PROVENTIL HFA;VENTOLIN HFA) 108 (90 Base) MCG/ACT inhaler Inhale 2 puffs into the lungs every 6 (six) hours as needed for wheezing or shortness of breath. (Patient not taking:  Reported on 06/11/2018) 1 Inhaler 2   No facility-administered medications prior to visit.     ROS Review of Systems  Constitutional: Negative for activity change, appetite change and fatigue.  HENT: Negative for congestion, sinus pressure and sore throat.   Eyes: Negative for visual disturbance.  Respiratory: Negative for cough, chest tightness, shortness of breath and wheezing.   Cardiovascular: Negative for chest pain and palpitations.  Gastrointestinal: Negative for abdominal distention, abdominal pain and constipation.  Endocrine: Negative for polydipsia.  Genitourinary: Negative for dysuria and frequency.  Musculoskeletal: Positive for back pain. Negative  for arthralgias.  Skin: Negative for rash.  Neurological: Negative for tremors, light-headedness and numbness.  Hematological: Does not bruise/bleed easily.  Psychiatric/Behavioral: Negative for agitation and behavioral problems.       Positive for anxiety    Objective:  BP (!) 143/80   Pulse 64   Temp 98.1 F (36.7 C) (Oral)   Ht 5' 8"  (1.727 m)   Wt (!) 346 lb 9.6 oz (157.2 kg)   SpO2 99%   BMI 52.70 kg/m   BP/Weight 06/11/2018 04/16/2018 11/05/8117  Systolic BP 147 829 562  Diastolic BP 80 91 90  Wt. (Lbs) 346.6 347 356.2  BMI 52.7 52.76 51.11      Physical Exam  Constitutional: She is oriented to person, place, and time. She appears well-developed and well-nourished.  Morbidly obese  Cardiovascular: Normal rate, normal heart sounds and intact distal pulses.  No murmur heard. Pulmonary/Chest: Effort normal and breath sounds normal. She has no wheezes. She has no rales. She exhibits no tenderness.  Abdominal: Soft. Bowel sounds are normal. She exhibits no distension and no mass. There is no tenderness.  Musculoskeletal: Normal range of motion.  Neurological: She is alert and oriented to person, place, and time.  Skin: Skin is warm and dry.  Psychiatric: She has a normal mood and affect.    CMP Latest Ref Rng & Units 10/09/2017 12/08/2016 11/14/2016  Glucose 65 - 99 mg/dL 93 125(H) 110(H)  BUN 6 - 24 mg/dL 14 10 11   Creatinine 0.57 - 1.00 mg/dL 1.15(H) 0.89 0.86  Sodium 134 - 144 mmol/L 140 135 136  Potassium 3.5 - 5.2 mmol/L 4.9 3.8 4.1  Chloride 96 - 106 mmol/L 104 106 108  CO2 20 - 29 mmol/L 23 24 23   Calcium 8.7 - 10.2 mg/dL 9.2 8.6(L) 8.5(L)  Total Protein 6.0 - 8.5 g/dL 7.0 - 7.1  Total Bilirubin 0.0 - 1.2 mg/dL 0.2 - 0.4  Alkaline Phos 39 - 117 IU/L 86 - 74  AST 0 - 40 IU/L 22 - 23  ALT 0 - 32 IU/L 20 - 21    Lipid Panel     Component Value Date/Time   CHOL 177 09/06/2016 1018   TRIG 97 09/06/2016 1018   HDL 47 (L) 09/06/2016 1018   CHOLHDL 3.8  09/06/2016 1018    Lab Results  Component Value Date   HGBA1C 6.2 06/11/2018    The 10-year ASCVD risk score Mikey Bussing DC Jr., et al., 2013) is: 8.3%   Values used to calculate the score:     Age: 10 years     Sex: Female     Is Non-Hispanic African American: Yes     Diabetic: Yes     Tobacco smoker: Yes     Systolic Blood Pressure: 130 mmHg     Is BP treated: No     HDL Cholesterol: 47 mg/dL     Total Cholesterol: 177 mg/dL  Assessment & Plan:   1. Type 2 diabetes mellitus without complication, without long-term current use of insulin (HCC) Controlled with A1c of 6.2 with additional weight loss of 10 pounds She has been off Bydureon and I have advised her to resume given ongoing benefits Statin added to regimen - POCT glucose (manual entry) - POCT glycosylated hemoglobin (Hb A1C) - Exenatide ER (BYDUREON) 2 MG PEN; Inject 2 mg into the skin once a week.  Dispense: 4 each; Refill: 3 - Lipid panel; Future - atorvastatin (LIPITOR) 20 MG tablet; Take 1 tablet (20 mg total) by mouth daily.  Dispense: 30 tablet; Refill: 3 - Microalbumin/Creatinine Ratio, Urine; Future  2. Essential hypertension Elevated blood pressure Lifestyle modifications and if still elevated at next visit we will commence antihypertensive Counseled on blood pressure goal of less than 130/80, low-sodium, DASH diet, medication compliance, 150 minutes of moderate intensity exercise per week. Discussed medication compliance, adverse effects.   3. Anxiety and depression Uncontrolled Triggered by underlying stressors She declines initiation of SSRI at this time-discussed that Cymbalta will be beneficial especially in the setting of anxiety and depression as well as back pain LCSW called in for counseling We will reassess at next visit Initiated hydroxyzine at low dose given she has been sedated with low doses of muscle relaxants in the past - hydrOXYzine (ATARAX/VISTARIL) 10 MG tablet; Take 1 tablet (10 mg total)  by mouth 2 (two) times daily as needed.  Dispense: 60 tablet; Refill: 3  4. Encounter for initial prescription of contraceptive pills She has quit smoking-discussed the risk of OCP use in women above 35 - norgestimate-ethinyl estradiol ([place] 28) 0.25-35 MG-MCG tablet; Take 1 tablet by mouth daily.  Dispense: 1 Package; Refill: 11   Meds ordered this encounter  Medications  . Exenatide ER (BYDUREON) 2 MG PEN    Sig: Inject 2 mg into the skin once a week.    Dispense:  4 each    Refill:  3    Failed Metformin  . norgestimate-ethinyl estradiol (SPRINTEC 28) 0.25-35 MG-MCG tablet    Sig: Take 1 tablet by mouth daily.    Dispense:  1 Package    Refill:  11  . hydrOXYzine (ATARAX/VISTARIL) 10 MG tablet    Sig: Take 1 tablet (10 mg total) by mouth 2 (two) times daily as needed.    Dispense:  60 tablet    Refill:  3  . atorvastatin (LIPITOR) 20 MG tablet    Sig: Take 1 tablet (20 mg total) by mouth daily.    Dispense:  30 tablet    Refill:  3    Follow-up: Return in about 6 weeks (around 07/23/2018) for follow up on anxiety.   [name] MD

## 2018-06-11 NOTE — Progress Notes (Signed)
Patient wants to discuss birth control.

## 2018-06-13 NOTE — BH Specialist Note (Signed)
Integrated Behavioral Health Initial Visit  MRN: [id number] Name: Nicole [name]  Number of Integrated Behavioral Health Clinician visits:: 1/6 Session Start time: 12:20 PM  Session End time: 12:50 PM Total time: 30 minutes  Type of Service: Integrated Behavioral Health- Individual/Family Interpretor:No. Interpretor Name and Language: N/A   Warm Hand Off Completed.       SUBJECTIVE: Nicole [name] is a 42 y.o. female accompanied by self Patient was referred by Dr. Alvis LemmingsNewlin for anxiety and depression. 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 Reduce symptoms of: agitation, anxiety and depression 2. Increase knowledge and/or ability of: coping skills and healthy habits  3. Demonstrate ability to: Increase healthy adjustment to current life circumstances, Increase adequate support systems for patient/family and Begin healthy grieving over loss  INTERVENTIONS: Interventions utilized: Mindfulness or Relaxation Training, Supportive Counseling and Psychoeducation and/or Health Education  Standardized Assessments completed: GAD-7 and PHQ 2&9  ASSESSMENT: Patient currently experiencing depression and anxiety triggered by the death of her mother in 2017. She reports panic attacks, racing thoughts, irritability, crying episodes, and feelings of sadness. Pt denies hx of SI/HI/AVH. She receives  support from family.   Patient may benefit from psychoeducation, psychotherapy, and medication management. LCSWA educated pt on the correlation between one's physical and mental health. Pt was informed of the stages of grief and her feelings were validated. Support was provided. Healthy coping skills were discussed and pt agreed to [facility]   PLAN: 1. Follow up with behavioral health clinician on : Pt was encouraged to contact LCSWA if symptoms worsen or fail to improve to [facility]. 2. Behavioral recommendations: LCSWA recommends that pt apply healthy coping skills discussed and utilize provided resources. Pt is encouraged to schedule follow up appointment with LCSWA 3. Referral(s): Integrated Art gallery managerBehavioral Health Services (In Clinic) and Community Mental Health Services (LME/Outside Clinic) 4. "From scale of 1-10, how likely are you to follow plan?":   Nicole LarssonJasmine D Kearstyn Avitia, LCSW 06/13/18 4:54 PM

## 2018-06-18 ENCOUNTER — Other Ambulatory Visit: Payer: Self-pay | Admitting: Family Medicine

## 2018-06-18 MED ORDER — METRONIDAZOLE 0.75 % VA GEL: 1.0000 | Freq: Every day | VAGINAL | 0 refills | Status: DC

## 2018-06-18 NOTE — Progress Notes (Signed)
Call received from patient requesting treatment for bacterial vaginosis

## 2018-06-20 ENCOUNTER — Encounter: Payer: Self-pay | Admitting: Licensed Clinical Social Worker

## 2018-06-20 NOTE — Progress Notes (Signed)
A completed referral [facility] was faxed

## 2018-06-23 ENCOUNTER — Telehealth: Payer: Self-pay | Admitting: Family Medicine

## 2018-06-23 NOTE — Telephone Encounter (Signed)
Patient was called and patient would like to get a pill instead of a gel for her BV

## 2018-06-23 NOTE — Telephone Encounter (Signed)
1) Medication(s) Requested (by name):  Patient states she does not want the gel. Patient  would instead like to get the pill. Please follow up with patient    2) Pharmacy of Choice:   [facility] [address]

## 2018-06-24 MED ORDER — METRONIDAZOLE 500 MG PO TABS: 500.0000 mg | ORAL_TABLET | Freq: Two times a day (BID) | ORAL | 0 refills | Status: DC

## 2018-06-24 NOTE — Telephone Encounter (Signed)
Tablets have been sent to her pharmacy

## 2018-06-24 NOTE — Telephone Encounter (Signed)
Patient was called and informed of medication being sent to pharmacy. 

## 2018-11-05 ENCOUNTER — Telehealth: Payer: Self-pay | Admitting: Family Medicine

## 2018-11-05 NOTE — Telephone Encounter (Signed)
1) Medication(s) Requested (by name): metroNIDAZOLE (FLAGYL) 500 MG tablet   2) Pharmacy of Choice: [facility] [address]  3) Special Requests:   Approved medications will be sent to the pharmacy, we will reach out if there is an issue.  Requests made after 3pm may not be addressed until the following business day!  If a patient is unsure of the name of the medication(s) please note and ask patient to call back when they are able to provide all info, do not send to responsible party until all information is available!

## 2018-11-05 NOTE — Telephone Encounter (Signed)
Follow up   Pt is calling states she received her prescription at her pharmacy but she needs prior authorization

## 2018-11-10 NOTE — Telephone Encounter (Signed)
No new RX was sent, pharmacy confirmed this, the patient needs to be seen to have this med refilled.

## 2018-11-20 ENCOUNTER — Telehealth: Payer: Self-pay | Admitting: Family Medicine

## 2018-11-20 DIAGNOSIS — E119 Type 2 diabetes mellitus without complications: Secondary | ICD-10-CM

## 2018-11-20 MED ORDER — EXENATIDE ER 2 MG ~~LOC~~ PEN: 2.0000 mg | PEN_INJECTOR | SUBCUTANEOUS | 0 refills | Status: DC

## 2018-11-20 NOTE — Telephone Encounter (Signed)
1) Medication(s) Requested (by name): Exenatide ER (BYDUREON) 2 MG PEN   2) Pharmacy of Choice: [id number] - [place], [place] - [address]  3) Special Requests:   Approved medications will be sent to the pharmacy, we will reach out if there is an issue.  Requests made after 3pm may not be addressed until the following business day!  If a patient is unsure of the name of the medication(s) please note and ask patient to call back when they are able to provide all info, do not send to responsible party until all information is available!

## 2018-11-26 ENCOUNTER — Ambulatory Visit: Payer: Medicaid Other | Attending: Family Medicine | Admitting: Physician Assistant

## 2018-11-26 ENCOUNTER — Other Ambulatory Visit: Payer: Self-pay

## 2018-11-26 DIAGNOSIS — J9801 Acute bronchospasm: Secondary | ICD-10-CM | POA: Diagnosis not present

## 2018-11-26 DIAGNOSIS — B9689 Other specified bacterial agents as the cause of diseases classified elsewhere: Secondary | ICD-10-CM

## 2018-11-26 DIAGNOSIS — N760 Acute vaginitis: Secondary | ICD-10-CM | POA: Diagnosis not present

## 2018-11-26 DIAGNOSIS — F329 Major depressive disorder, single episode, unspecified: Secondary | ICD-10-CM | POA: Diagnosis not present

## 2018-11-26 DIAGNOSIS — E119 Type 2 diabetes mellitus without complications: Secondary | ICD-10-CM | POA: Diagnosis not present

## 2018-11-26 DIAGNOSIS — N898 Other specified noninflammatory disorders of vagina: Secondary | ICD-10-CM

## 2018-11-26 DIAGNOSIS — F419 Anxiety disorder, unspecified: Secondary | ICD-10-CM

## 2018-11-26 MED ORDER — METRONIDAZOLE 500 MG PO TABS: 500.0000 mg | ORAL_TABLET | Freq: Two times a day (BID) | ORAL | 0 refills | Status: DC

## 2018-11-26 MED ORDER — HYDROXYZINE HCL 10 MG PO TABS: 10.0000 mg | ORAL_TABLET | Freq: Two times a day (BID) | ORAL | 3 refills | Status: DC | PRN

## 2018-11-26 MED ORDER — ATORVASTATIN CALCIUM 20 MG PO TABS: 20.0000 mg | ORAL_TABLET | Freq: Every day | ORAL | 3 refills | Status: DC

## 2018-11-26 MED ORDER — ALBUTEROL SULFATE HFA 108 (90 BASE) MCG/ACT IN AERS: 2.0000 | INHALATION_SPRAY | Freq: Four times a day (QID) | RESPIRATORY_TRACT | 2 refills | Status: DC | PRN

## 2018-11-26 MED ORDER — EXENATIDE ER 2 MG ~~LOC~~ PEN: 2.0000 mg | PEN_INJECTOR | SUBCUTANEOUS | 3 refills | Status: DC

## 2018-11-26 NOTE — Progress Notes (Signed)
Medication for all medication.   Hydroxyzine  Has not taken blood sugars or insulin for DM.   Swelling  throughout the body Frequent voiding, but scanty and noticed odor  Denies pain

## 2018-11-26 NOTE — Progress Notes (Signed)
Patient ID: Nicole [name], female   DOB: 1975-11-13, 43 y.o.   MRN: [id number]   Virtual Visit via Telephone Note  I connected with Nicole [name] on 11/26/18 at  9:30 AM EDT by telephone and verified that I am speaking with the correct person using two identifiers.   I discussed the limitations, risks, security and privacy concerns of performing an evaluation and management service by telephone and the availability of in person appointments. I also discussed with the patient that there may be a patient responsible charge related to this service. The patient expressed understanding and agreed to proceed.  Patient location:  home My Location:  [facility] office Persons on the call:  Myself and the patient    History of Present Illness:  Patient frustrated bc she says she has called for RF multiple times.  Has a h/o recurrent BV and unwilling to bring urine sample in today bc she says the last urine specimen she dropped off got lost.  Has fishy vaginal odor.  Scant discharge.  Doesn't feel like a UTI.  Monogamous with partner.  No pelvic pain, abdominal pain or fever.   Needs RF of vistaril.  Has a fair amount of anxiety and this has helped in the past.   Needs diabetes meds.  Has been out for a while.  Says RF wasn't sent.  It appears it was sent on our end.      Observations/Objective:  TP linear.  patient seems frustrated but calms with reassurance. Speech is clear    Assessment and Plan: 1. Type 2 diabetes mellitus without complication, without long-term current use of insulin (HCC) Uncontrolled-not checking sugars.  Check sugars, watch diet/follow DM diet.   - atorvastatin (LIPITOR) 20 MG tablet; Take 1 tablet (20 mg total) by mouth daily.  Dispense: 30 tablet; Refill: 3 - Exenatide ER (BYDUREON) 2 MG PEN; Inject 2 mg into the skin once a week.  Dispense: 4 each; Refill: 3  2. Anxiety and depression resume - hydrOXYzine (ATARAX/VISTARIL) 10 MG tablet; Take 1 tablet (10 mg total) by mouth  2 (two) times daily as needed.  Dispense: 60 tablet; Refill: 3  3. Vaginal odor See #4  4. Bacterial vaginosis Will treat just bc she has had this so many times and says her urine was lost last time in addition to Covid risk mitigation - metroNIDAZOLE (FLAGYL) 500 MG tablet; Take 1 tablet (500 mg total) by mouth 2 (two) times daily.  Dispense: 14 tablet; Refill: 0  5. Bronchospasm - albuterol (VENTOLIN HFA) 108 (90 Base) MCG/ACT inhaler; Inhale 2 puffs into the lungs every 6 (six) hours as needed for wheezing or shortness of breath.  Dispense: 1 Inhaler; Refill: 2    Follow Up Instructions: 2 months with PCP   I discussed the assessment and treatment plan with the patient. The patient was provided an opportunity to ask questions and all were answered. The patient agreed with the plan and demonstrated an understanding of the instructions.   The patient was advised to call back or seek an in-person evaluation if the symptoms worsen or if the condition fails to improve as anticipated.  I provided 14 minutes of non-face-to-face time during this encounter.   Georgian Co, PA-C

## 2018-12-03 ENCOUNTER — Telehealth: Payer: Self-pay | Admitting: Family Medicine

## 2018-12-03 NOTE — Progress Notes (Signed)
COVID Hotel Screening performed. Temperature, PHQ-9, and need for medical care and medications assessed. No additional needs assessed at this time.  Sharell Hilmer RN MSN 

## 2018-12-03 NOTE — Telephone Encounter (Signed)
Pt called back stated that her medication Exenatide ER (BYDUREON) 2 MG PEN was [facility] and she says medicaid sent over something for pcp to authorize her med please follow up

## 2018-12-04 NOTE — Telephone Encounter (Signed)
Call was placed to patient and a voicemail was left informing patient to return phone call. 

## 2018-12-04 NOTE — Telephone Encounter (Signed)
Patient was called and states that a prior Berkley Harvey is needed for this medication. Please follow up with patient once done.

## 2018-12-11 NOTE — Progress Notes (Signed)
COVID Hotel Screening performed. Temperature, PHQ-9, and need for medical care and medications assessed. No additional needs assessed at this time.  Theone Bowell RN MSN 

## 2018-12-16 NOTE — Telephone Encounter (Signed)
PA was approved. 

## 2018-12-18 NOTE — Progress Notes (Signed)
COVID Hotel Screening performed. Temperature, PHQ-9, and need for medical care and medications assessed. No additional needs assessed at this time.  Rickia Freeburg RN MSN 

## 2018-12-24 NOTE — Progress Notes (Signed)
COVID Hotel Screening performed. Temperature, PHQ-9, and need for medical care and medications assessed. No additional needs assessed at this time.  Edras Wilford RN MSN 

## 2019-01-01 ENCOUNTER — Other Ambulatory Visit (HOSPITAL_COMMUNITY): Payer: Self-pay

## 2019-01-01 DIAGNOSIS — Z20822 Contact with and (suspected) exposure to covid-19: Secondary | ICD-10-CM

## 2019-01-02 ENCOUNTER — Other Ambulatory Visit: Payer: Self-pay

## 2019-01-02 DIAGNOSIS — Z20822 Contact with and (suspected) exposure to covid-19: Secondary | ICD-10-CM

## 2019-01-04 LAB — NOVEL CORONAVIRUS, NAA: SARS-CoV-2, NAA: NOT DETECTED

## 2019-01-05 NOTE — Progress Notes (Signed)
COVID Hotel Screening performed. COVID screening, temperature, and need for medical care and medications assessed. Patient agreed to the COVID-19 testing. No additional needs assessed at this time.  Taje Tondreau RN MSN 

## 2019-01-08 NOTE — Progress Notes (Signed)
COVID Hotel Screening performed. COVID screening, temperature, PHQ-9, and need for medical care and medications assessed. No additional needs assessed at this time.  Johan Antonacci RN MSN 

## 2019-01-15 NOTE — Progress Notes (Signed)
COVID Hotel Screening performed. COVID screening, temperature, PHQ-9, and need for medical care and medications assessed. No additional needs assessed at this time.  Bexley Mclester RN MSN 

## 2019-01-29 ENCOUNTER — Ambulatory Visit: Payer: Medicaid Other | Admitting: Family Medicine

## 2019-01-30 NOTE — Progress Notes (Signed)
COVID Hotel Screening performed. Temperature, PHQ-9, and need for medical care and medications assessed. No additional needs assessed at this time.  Nicole Williamson  MSN, RN 

## 2019-02-05 ENCOUNTER — Other Ambulatory Visit: Payer: Self-pay | Admitting: *Deleted

## 2019-02-05 DIAGNOSIS — Z20822 Contact with and (suspected) exposure to covid-19: Secondary | ICD-10-CM

## 2019-02-12 LAB — NOVEL CORONAVIRUS, NAA: SARS-CoV-2, NAA: NOT DETECTED

## 2019-03-08 IMAGING — CR DG CHEST 2V
2 series · 2 of 2 positions shown · non-contrast
Comparison: 05/23/2016

CLINICAL DATA: Chest pain, body aches, shortness of breath starting
today

EXAM:
CHEST  2 VIEW

[w chest lat]
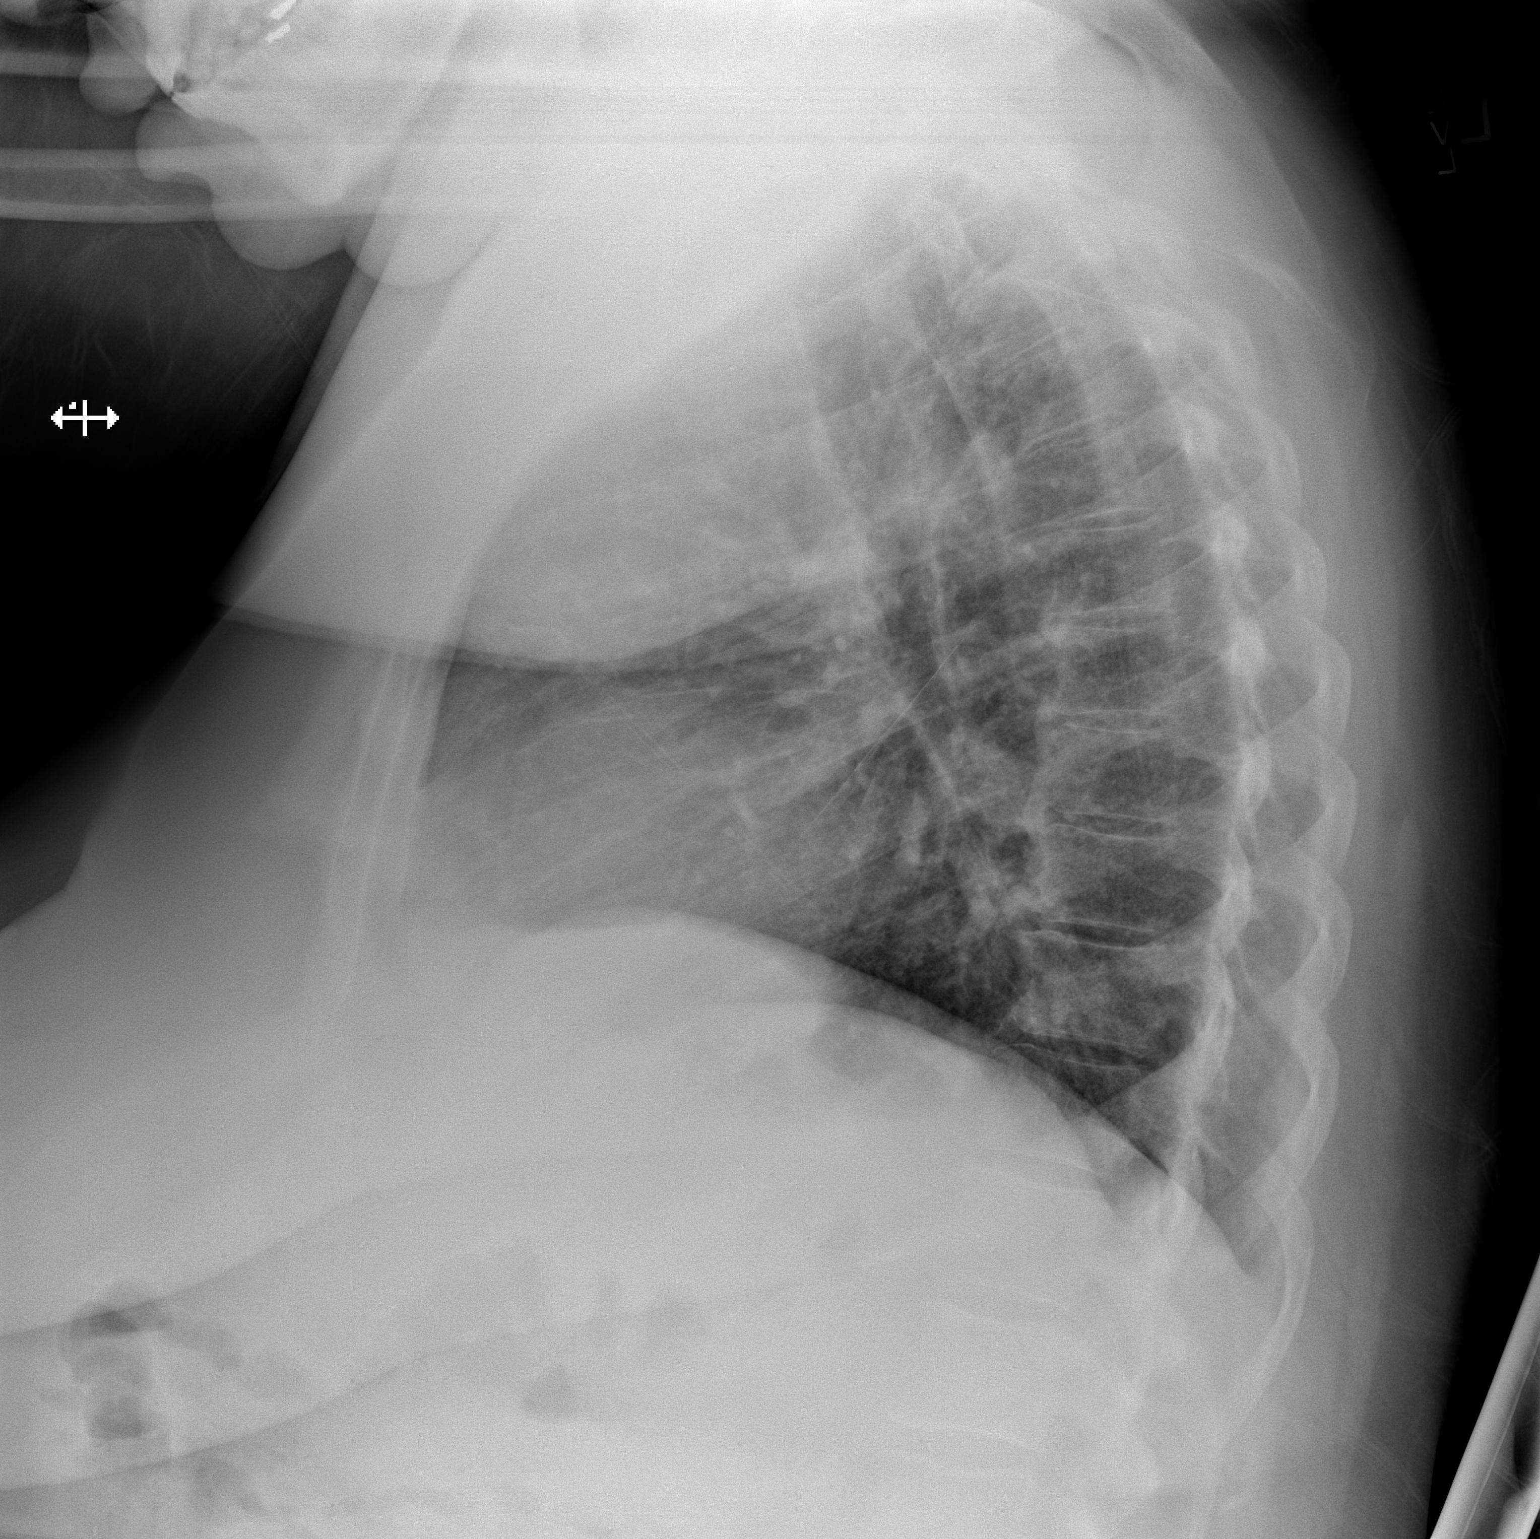

[x chest ap]
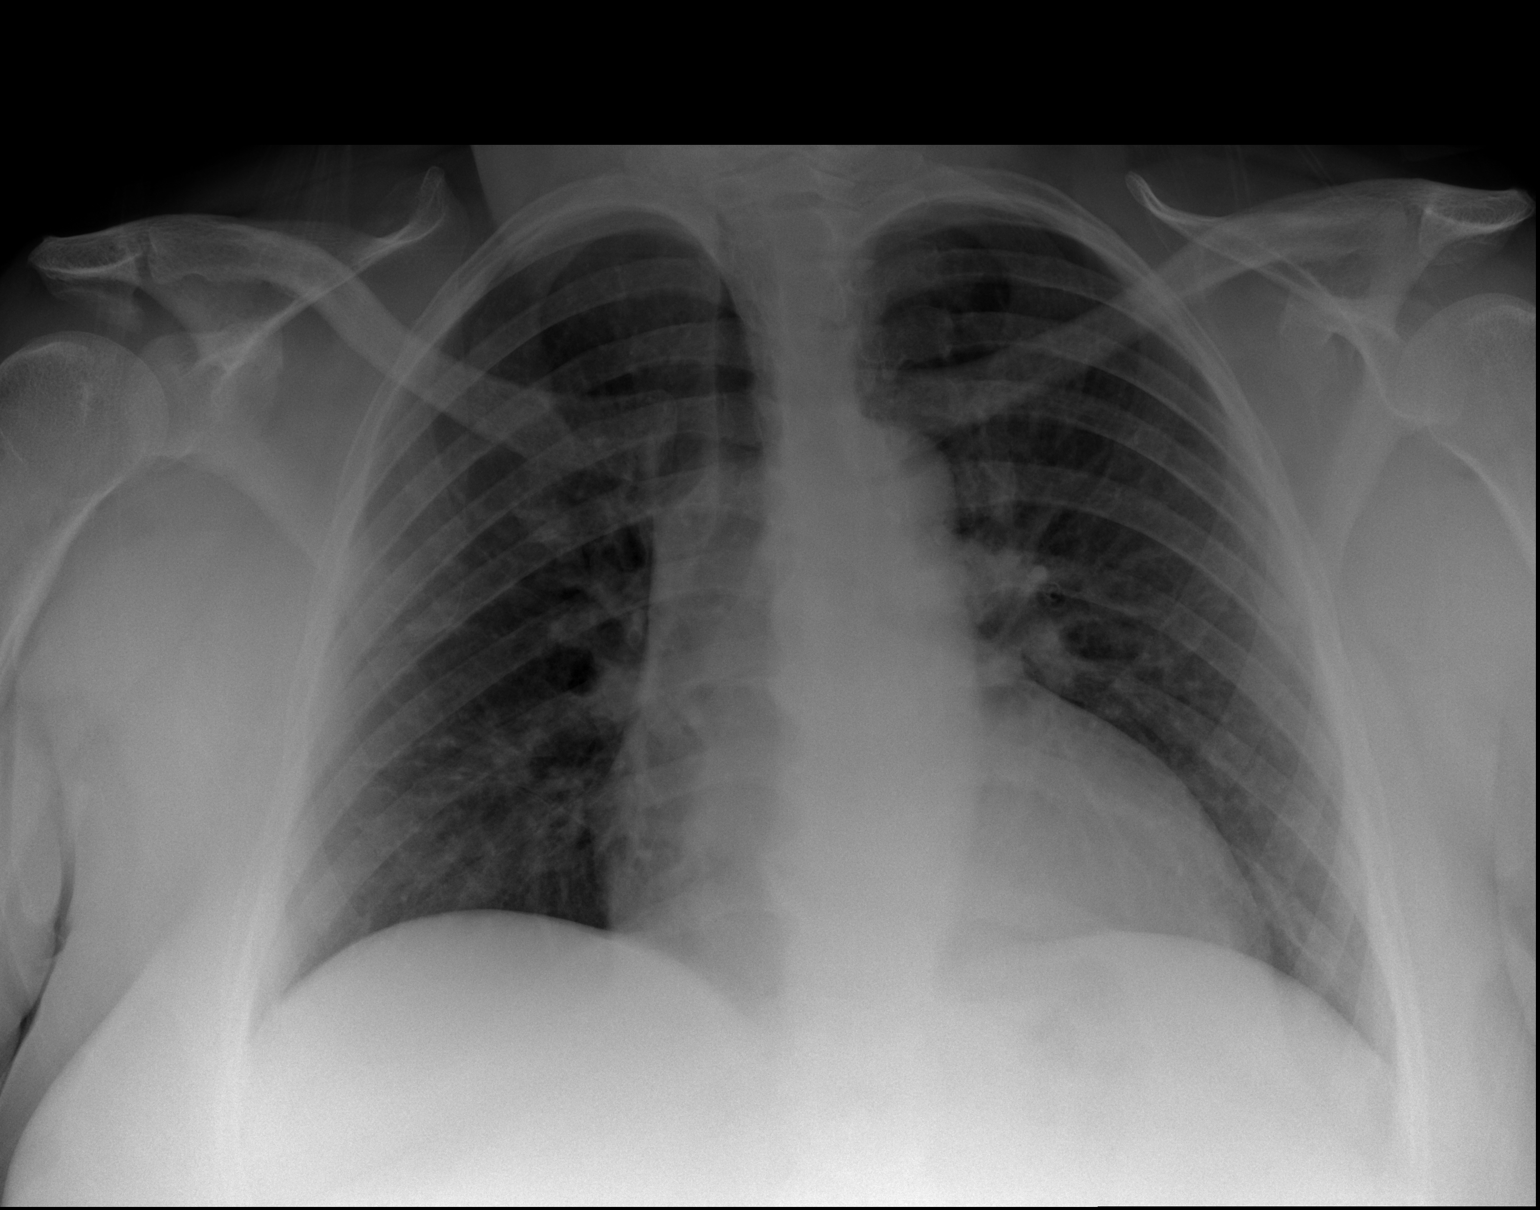

[2 of 2 positions shown; findings below may reference images not displayed]

FINDINGS: Cardiomediastinal silhouette is stable. No infiltrate or pleural
effusion. No pulmonary edema. Stable degenerative changes mid and
lower thoracic spine.
IMPRESSION: No active cardiopulmonary disease.

## 2019-03-18 NOTE — Progress Notes (Signed)
COVID-19 Screening performed. Temperature, PHQ-9, and need for medical care and medications assessed. No additional needs assessed at this time.  Iniya Matzek MSN, RN 

## 2019-03-22 NOTE — Progress Notes (Signed)
COVID-19 Screening performed. Temperature, PHQ-9, and need for medical care and medications assessed. No additional needs assessed at this time.  Birttany Dechellis MSN, RN 

## 2019-04-22 NOTE — Progress Notes (Signed)
COVID-19 Screening performed. Temperature, PHQ-9, and need for medical care and medications assessed. No additional needs assessed at this time.  Briant Angelillo MSN, RN 

## 2019-06-15 ENCOUNTER — Encounter: Payer: Self-pay | Admitting: Family Medicine

## 2019-06-15 ENCOUNTER — Other Ambulatory Visit: Payer: Self-pay

## 2019-06-15 ENCOUNTER — Ambulatory Visit: Payer: Medicaid Other | Attending: Family Medicine | Admitting: Family Medicine

## 2019-06-15 VITALS — BP 138/81 | HR 69 | Temp 98.0°F | Ht 68.0 in | Wt 364.0 lb

## 2019-06-15 DIAGNOSIS — Z6841 Body Mass Index (BMI) 40.0 and over, adult: Secondary | ICD-10-CM | POA: Insufficient documentation

## 2019-06-15 DIAGNOSIS — Z79899 Other long term (current) drug therapy: Secondary | ICD-10-CM | POA: Diagnosis not present

## 2019-06-15 DIAGNOSIS — M549 Dorsalgia, unspecified: Secondary | ICD-10-CM | POA: Diagnosis not present

## 2019-06-15 DIAGNOSIS — F32A Depression, unspecified: Secondary | ICD-10-CM

## 2019-06-15 DIAGNOSIS — I10 Essential (primary) hypertension: Secondary | ICD-10-CM | POA: Insufficient documentation

## 2019-06-15 DIAGNOSIS — E119 Type 2 diabetes mellitus without complications: Secondary | ICD-10-CM

## 2019-06-15 DIAGNOSIS — J9801 Acute bronchospasm: Secondary | ICD-10-CM | POA: Insufficient documentation

## 2019-06-15 DIAGNOSIS — F329 Major depressive disorder, single episode, unspecified: Secondary | ICD-10-CM | POA: Diagnosis not present

## 2019-06-15 DIAGNOSIS — M79672 Pain in left foot: Secondary | ICD-10-CM | POA: Diagnosis not present

## 2019-06-15 DIAGNOSIS — F419 Anxiety disorder, unspecified: Secondary | ICD-10-CM | POA: Insufficient documentation

## 2019-06-15 DIAGNOSIS — Z720 Tobacco use: Secondary | ICD-10-CM | POA: Diagnosis not present

## 2019-06-15 DIAGNOSIS — E559 Vitamin D deficiency, unspecified: Secondary | ICD-10-CM | POA: Diagnosis not present

## 2019-06-15 DIAGNOSIS — F17210 Nicotine dependence, cigarettes, uncomplicated: Secondary | ICD-10-CM | POA: Insufficient documentation

## 2019-06-15 LAB — GLUCOSE, POCT (MANUAL RESULT ENTRY): POC Glucose: 160 mg/dl — AB (ref 70–99)

## 2019-06-15 LAB — POCT GLYCOSYLATED HEMOGLOBIN (HGB A1C): HbA1c, POC (controlled diabetic range): 7.9 % — AB (ref 0.0–7.0)

## 2019-06-15 MED ORDER — MELOXICAM 7.5 MG PO TABS: 7.5000 mg | ORAL_TABLET | Freq: Every day | ORAL | 1 refills | Status: DC

## 2019-06-15 MED ORDER — SPIRIVA HANDIHALER 18 MCG IN CAPS: 18.0000 ug | ORAL_CAPSULE | Freq: Every day | RESPIRATORY_TRACT | 6 refills | Status: DC

## 2019-06-15 MED ORDER — BYDUREON 2 MG ~~LOC~~ PEN: 2.0000 mg | PEN_INJECTOR | SUBCUTANEOUS | 6 refills | Status: DC

## 2019-06-15 MED ORDER — KETOROLAC TROMETHAMINE 60 MG/2ML IM SOLN
60.0000 mg | Freq: Once | INTRAMUSCULAR | Status: AC
  Administered 2019-06-15: 60 mg via INTRAMUSCULAR

## 2019-06-15 MED ORDER — HYDROXYZINE HCL 10 MG PO TABS: 10.0000 mg | ORAL_TABLET | Freq: Two times a day (BID) | ORAL | 6 refills | Status: DC | PRN

## 2019-06-15 MED ORDER — ATORVASTATIN CALCIUM 20 MG PO TABS: 20.0000 mg | ORAL_TABLET | Freq: Every day | ORAL | 3 refills | Status: DC

## 2019-06-15 MED ORDER — ALBUTEROL SULFATE HFA 108 (90 BASE) MCG/ACT IN AERS: 2.0000 | INHALATION_SPRAY | Freq: Four times a day (QID) | RESPIRATORY_TRACT | 6 refills | Status: DC | PRN
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
## 2019-06-16 ENCOUNTER — Other Ambulatory Visit: Payer: Self-pay | Admitting: Family Medicine

## 2019-06-16 LAB — CMP14+EGFR
ALT: 34 IU/L — ABNORMAL HIGH (ref 0–32)
AST: 30 IU/L (ref 0–40)
Albumin/Globulin Ratio: 1.3 (ref 1.2–2.2)
Albumin: 4 g/dL (ref 3.8–4.8)
Alkaline Phosphatase: 127 IU/L — ABNORMAL HIGH (ref 39–117)
BUN/Creatinine Ratio: 12 (ref 9–23)
BUN: 11 mg/dL (ref 6–24)
Bilirubin Total: 0.2 mg/dL (ref 0.0–1.2)
CO2: 24 mmol/L (ref 20–29)
Calcium: 9.7 mg/dL (ref 8.7–10.2)
Chloride: 102 mmol/L (ref 96–106)
Creatinine, Ser: 0.9 mg/dL (ref 0.57–1.00)
GFR calc Af Amer: 90 mL/min/{1.73_m2} (ref >59)
GFR calc non Af Amer: 78 mL/min/{1.73_m2} (ref >59)
Globulin, Total: 3.1 g/dL (ref 1.5–4.5)
Glucose: 174 mg/dL — ABNORMAL HIGH (ref 65–99)
Potassium: 4.9 mmol/L (ref 3.5–5.2)
Sodium: 140 mmol/L (ref 134–144)
Total Protein: 7.1 g/dL (ref 6.0–8.5)

## 2019-06-16 LAB — VITAMIN D 25 HYDROXY (VIT D DEFICIENCY, FRACTURES): Vit D, 25-Hydroxy: 11.4 ng/mL — ABNORMAL LOW (ref 30.0–100.0)

## 2019-06-16 MED ORDER — VITAMIN D (ERGOCALCIFEROL) 1.25 MG (50000 UNIT) PO CAPS: 50000.0000 [IU] | ORAL_CAPSULE | ORAL | 0 refills | Status: DC

## 2019-06-19 ENCOUNTER — Telehealth: Payer: Self-pay

## 2019-06-19 NOTE — Telephone Encounter (Signed)
Patient was called and informed of lab results and medication being sent to pharmacy. 

## 2019-06-19 NOTE — Telephone Encounter (Signed)
-----   Message from [name], MD sent at 06/16/2019  1:21 PM EST ----- Your vitamin D level is low.  I have sent a refill of Drisdol to her pharmacy.  Other labs are stable.

## 2019-06-29 ENCOUNTER — Ambulatory Visit (INDEPENDENT_AMBULATORY_CARE_PROVIDER_SITE_OTHER): Payer: Medicaid Other

## 2019-06-29 ENCOUNTER — Other Ambulatory Visit: Payer: Self-pay | Admitting: Podiatry

## 2019-06-29 ENCOUNTER — Ambulatory Visit: Payer: Medicaid Other | Admitting: Podiatry

## 2019-06-29 ENCOUNTER — Encounter: Payer: Self-pay | Admitting: Podiatry

## 2019-06-29 ENCOUNTER — Other Ambulatory Visit: Payer: Self-pay

## 2019-06-29 VITALS — BP 140/82

## 2019-06-29 DIAGNOSIS — M659 Synovitis and tenosynovitis, unspecified: Secondary | ICD-10-CM

## 2019-06-29 DIAGNOSIS — M216X2 Other acquired deformities of left foot: Secondary | ICD-10-CM | POA: Diagnosis not present

## 2019-06-29 DIAGNOSIS — M778 Other enthesopathies, not elsewhere classified: Secondary | ICD-10-CM | POA: Diagnosis not present

## 2019-06-29 DIAGNOSIS — M79672 Pain in left foot: Secondary | ICD-10-CM

## 2019-06-29 MED ORDER — METHYLPREDNISOLONE 4 MG PO TBPK: ORAL_TABLET | ORAL | 0 refills | Status: DC

## 2019-07-02 NOTE — Progress Notes (Signed)
   Subjective:  43 y.o. female presenting today as a new patient with a chief complaint of shooting pain noted to the dorsal left foot that began about one year ago. Walking and being on the foot increases the pain. She has used heat/ice therapy, compression and worn different shoes with no significant relief. Patient is here for further evaluation and treatment.    Past Medical History:  Diagnosis Date  . Arthritis    knees  . Hypertension   . Ovarian cyst   . Pregnancy induced hypertension      Objective / Physical Exam:  General:  The patient is alert and oriented x3 in no acute distress. Dermatology:  Skin is warm, dry and supple bilateral lower extremities. Negative for open lesions or macerations. Vascular:  Palpable pedal pulses bilaterally. No edema or erythema noted. Capillary refill within normal limits. Neurological:  Epicritic and protective threshold grossly intact bilaterally.  Musculoskeletal Exam:  Pain on palpation to the anterior lateral medial aspects of the patient's left ankle and also to the left midfoot. Mild edema noted. Range of motion within normal limits to all pedal and ankle joints bilateral. Muscle strength 5/5 in all groups bilateral.   Radiographic Exam:  Normal osseous mineralization. Joint spaces preserved. No fracture/dislocation/boney destruction.    Assessment: 1. Ankle synovitis left 2. Midfoot capsulitis left   Plan of Care:  1. Patient was evaluated. X-Rays reviewed.  2. Prescription for Medrol Dose Pak provided to patient. Then continue taking Meloxicam.  3. Declined injections. She has a fear/anxiety of needles.  4. CAM boot dispensed. Weightbearing for four weeks.  5. Compression anklet dispensed.  6. Return to clinic in 4 weeks.    Edrick Kins, DPM [facility]  Dr. Edrick Kins, [address]                                        [place], [place] [address]                Office [phone]   Fax [phone]

## 2019-07-06 ENCOUNTER — Other Ambulatory Visit: Payer: Medicaid Other | Admitting: Family Medicine

## 2019-08-03 ENCOUNTER — Ambulatory Visit: Payer: Medicaid Other | Admitting: Podiatry

## 2019-08-05 ENCOUNTER — Ambulatory Visit: Payer: Medicaid Other | Admitting: Family Medicine

## 2019-08-12 ENCOUNTER — Ambulatory Visit: Payer: Medicaid Other | Admitting: Family Medicine

## 2019-09-22 ENCOUNTER — Ambulatory Visit: Payer: Medicaid Other | Attending: Family Medicine | Admitting: Family Medicine

## 2019-09-22 ENCOUNTER — Encounter: Payer: Self-pay | Admitting: Family Medicine

## 2019-09-22 ENCOUNTER — Other Ambulatory Visit (HOSPITAL_COMMUNITY)
Admission: RE | Admit: 2019-09-22 | Discharge: 2019-09-22 | Disposition: A | Discharge status: Home / Self Care | Payer: Medicaid Other | Source: Ambulatory Visit | Attending: Family Medicine | Admitting: Family Medicine

## 2019-09-22 ENCOUNTER — Other Ambulatory Visit: Payer: Self-pay

## 2019-09-22 VITALS — BP 165/90 | HR 75 | Ht 68.0 in | Wt 366.0 lb

## 2019-09-22 DIAGNOSIS — Z113 Encounter for screening for infections with a predominantly sexual mode of transmission: Secondary | ICD-10-CM

## 2019-09-22 DIAGNOSIS — Z1231 Encounter for screening mammogram for malignant neoplasm of breast: Secondary | ICD-10-CM | POA: Diagnosis not present

## 2019-09-22 DIAGNOSIS — Z124 Encounter for screening for malignant neoplasm of cervix: Secondary | ICD-10-CM | POA: Insufficient documentation

## 2019-09-22 DIAGNOSIS — E119 Type 2 diabetes mellitus without complications: Secondary | ICD-10-CM

## 2019-09-22 DIAGNOSIS — Z0000 Encounter for general adult medical examination without abnormal findings: Secondary | ICD-10-CM

## 2019-09-22 LAB — POCT URINALYSIS DIP (CLINITEK)
Bilirubin, UA: NEGATIVE
Blood, UA: NEGATIVE
Glucose, UA: NEGATIVE mg/dL
Ketones, POC UA: NEGATIVE mg/dL
Leukocytes, UA: NEGATIVE
Nitrite, UA: NEGATIVE
POC PROTEIN,UA: NEGATIVE
Spec Grav, UA: 1.025 (ref 1.010–1.025)
Urobilinogen, UA: 0.2 E.U./dL
pH, UA: 5.5 (ref 5.0–8.0)

## 2019-09-22 LAB — POCT GLYCOSYLATED HEMOGLOBIN (HGB A1C): HbA1c, POC (controlled diabetic range): 8.1 % — AB (ref 0.0–7.0)

## 2019-09-22 LAB — GLUCOSE, POCT (MANUAL RESULT ENTRY): POC Glucose: 183 mg/dl — AB (ref 70–99)

## 2019-09-22 NOTE — Progress Notes (Signed)
Subjective:  Patient ID: Nicole [name], female    DOB: 06-Jan-1976  Age: 44 y.o. MRN: [id number]  CC: Annual Exam and Gynecologic Exam   HPI Nicole [name] is a 44 year old female with morbid obesity, Type 2 DM (A1c 7.9), osteoarthritis of the knee, tobacco abuse here for an annual physical exam.  She has vaginal itching which improved with discontinuation of soap which she previously used.  She has also noticed a fishy odor just before her periods. Concerned about Trichomononas which was mentioned by her boyfriend as he stated one of them had had a previous infection with trichomonas which she does not seem to recall. Her blood pressure is elevated and A1c is elevated at 8.1 and she endorses partial compliance with her medications.  Past Medical History:  Diagnosis Date  . Arthritis    knees  . Hypertension   . Ovarian cyst   . Pregnancy induced hypertension     Past Surgical History:  Procedure Laterality Date  . APPENDECTOMY    . CESAREAN SECTION    . HERNIA REPAIR    . LAPAROSCOPIC APPENDECTOMY N/A 03/25/2014   Procedure: APPENDECTOMY LAPAROSCOPIC;  Surgeon: Gwenyth Ober, MD;  Location: [facility] OR;  Service: General;  Laterality: N/A;    Family History  Problem Relation Age of Onset  . Hypertension Mother   . Cancer Mother        pancreatic cancer  . Arthritis Father   . Heart disease Maternal Grandmother     No Known Allergies  Outpatient Medications Prior to Visit  Medication Sig Dispense Refill  . albuterol (VENTOLIN HFA) 108 (90 Base) MCG/ACT inhaler Inhale 2 puffs into the lungs every 6 (six) hours as needed for wheezing or shortness of breath. 18 g 6  . Aspirin-Salicylamide-Caffeine (BC HEADACHE POWDER PO) Take 1 packet by mouth 2 (two) times daily as needed (headaches).    Marland Kitchen atorvastatin (LIPITOR) 20 MG tablet Take 1 tablet (20 mg total) by mouth daily. 30 tablet 3  . Exenatide ER (BYDUREON) 2 MG PEN Inject 2 mg into the skin once a week. 4 each 6  .  tiotropium (SPIRIVA HANDIHALER) 18 MCG inhalation capsule Place 1 capsule (18 mcg total) into inhaler and inhale daily. 30 capsule 6  . Blood Glucose Monitoring Suppl (ACCU-CHEK AVIVA) device Use as instructed daily. (Patient not taking: Reported on 09/22/2019) 1 each 0  . glucose blood (ACCU-CHEK AVIVA) test strip Use daily (Patient not taking: Reported on 09/22/2019) 30 each 12  . hydrOXYzine (ATARAX/VISTARIL) 10 MG tablet Take 1 tablet (10 mg total) by mouth 2 (two) times daily as needed. (Patient not taking: Reported on 09/22/2019) 60 tablet 6  . Lancets Misc. (ACCU-CHEK SOFTCLIX LANCET DEV) KIT 1 each by Does not apply route daily. (Patient not taking: Reported on 09/22/2019) 1 kit 12  . meloxicam (MOBIC) 7.5 MG tablet Take 1 tablet (7.5 mg total) by mouth daily. (Patient not taking: Reported on 09/22/2019) 30 tablet 1  . methylPREDNISolone (MEDROL DOSEPAK) 4 MG TBPK tablet 6 day dose pack - take as directed (Patient not taking: Reported on 09/22/2019) 21 tablet 0  . metroNIDAZOLE (FLAGYL) 500 MG tablet Take 1 tablet (500 mg total) by mouth 2 (two) times daily. (Patient not taking: Reported on 09/22/2019) 14 tablet 0  . norgestimate-ethinyl estradiol (SPRINTEC 28) 0.25-35 MG-MCG tablet Take 1 tablet by mouth daily. (Patient not taking: Reported on 09/22/2019) 1 Package 11  . Vitamin D, Ergocalciferol, (DRISDOL) 1.25 MG (50000 UT) CAPS capsule  Take 1 capsule (50,000 Units total) by mouth every 7 (seven) days. (Patient not taking: Reported on 09/22/2019) 16 capsule 0   No facility-administered medications prior to visit.     ROS Review of Systems  Constitutional: Negative for activity change, appetite change and fatigue.  HENT: Negative for congestion, sinus pressure and sore throat.   Eyes: Negative for visual disturbance.  Respiratory: Negative for cough, chest tightness, shortness of breath and wheezing.   Cardiovascular: Negative for chest pain and palpitations.  Gastrointestinal: Negative for  abdominal distention, abdominal pain and constipation.  Endocrine: Negative for polydipsia.  Genitourinary: Negative for dysuria and frequency.  Musculoskeletal: Negative for arthralgias and back pain.  Skin: Negative for rash.  Neurological: Negative for tremors, light-headedness and numbness.  Hematological: Does not bruise/bleed easily.  Psychiatric/Behavioral: Negative for agitation and behavioral problems.    Objective:  BP (!) 165/90   Pulse 75   Ht 5' 8" (1.727 m)   Wt (!) 366 lb (166 kg)   SpO2 98%   BMI 55.65 kg/m   BP/Weight 09/22/2019 06/29/2019 06/15/2019  Systolic BP 165 140 138  Diastolic BP 90 82 81  Wt. (Lbs) 366 - 364  BMI 55.65 - 55.35      Physical Exam Exam conducted with a chaperone present.  Constitutional:      General: She is not in acute distress.    Appearance: She is well-developed. She is obese. She is not diaphoretic.  HENT:     Head: Normocephalic.     Right Ear: External ear normal.     Left Ear: External ear normal.     Nose: Nose normal.  Eyes:     Conjunctiva/sclera: Conjunctivae normal.     Pupils: Pupils are equal, round, and reactive to light.  Neck:     Vascular: No JVD.  Cardiovascular:     Rate and Rhythm: Normal rate and regular rhythm.     Heart sounds: Normal heart sounds. No murmur. No gallop.   Pulmonary:     Effort: Pulmonary effort is normal. No respiratory distress.     Breath sounds: Normal breath sounds. No wheezing or rales.  Chest:     Chest wall: No tenderness.  Abdominal:     General: Bowel sounds are normal. There is no distension.     Palpations: Abdomen is soft. There is no mass.     Tenderness: There is no abdominal tenderness.  Genitourinary:    Comments: External genitalia, vagina, cervix-normal No cervical motion tenderness Musculoskeletal:        General: No tenderness. Normal range of motion.     Cervical back: Normal range of motion.  Skin:    General: Skin is warm and dry.  Neurological:      Mental Status: She is alert and oriented to person, place, and time.     Deep Tendon Reflexes: Reflexes are normal and symmetric.     CMP Latest Ref Rng & Units 06/15/2019 10/09/2017 12/08/2016  Glucose 65 - 99 mg/dL 174(H) 93 125(H)  BUN 6 - 24 mg/dL 11 14 10  Creatinine 0.57 - 1.00 mg/dL 0.90 1.15(H) 0.89  Sodium 134 - 144 mmol/L 140 140 135  Potassium 3.5 - 5.2 mmol/L 4.9 4.9 3.8  Chloride 96 - 106 mmol/L 102 104 106  CO2 20 - 29 mmol/L 24 23 24  Calcium 8.7 - 10.2 mg/dL 9.7 9.2 8.6(L)  Total Protein 6.0 - 8.5 g/dL 7.1 7.0 -  Total Bilirubin 0.0 - 1.2 mg/dL 0.2 0.2 -    Alkaline Phos 39 - 117 IU/L 127(H) 86 -  AST 0 - 40 IU/L 30 22 -  ALT 0 - 32 IU/L 34(H) 20 -    Lipid Panel     Component Value Date/Time   CHOL 177 09/06/2016 1018   TRIG 97 09/06/2016 1018   HDL 47 (L) 09/06/2016 1018   CHOLHDL 3.8 09/06/2016 1018    CBC    Component Value Date/Time   WBC 11.3 (H) 10/09/2017 1458   WBC 9.7 12/08/2016 1453   RBC 4.96 10/09/2017 1458   RBC 4.76 12/08/2016 1453   HGB 12.9 10/09/2017 1458   HCT 40.4 10/09/2017 1458   PLT 260 10/09/2017 1458   MCV 82 10/09/2017 1458   MCH 26.0 (L) 10/09/2017 1458   MCH 26.3 12/08/2016 1453   MCHC 31.9 10/09/2017 1458   MCHC 32.0 12/08/2016 1453   RDW 14.4 10/09/2017 1458   LYMPHSABS 3.2 (H) 10/09/2017 1458   MONOABS 342 09/06/2016 1018   EOSABS 0.2 10/09/2017 1458   BASOSABS 0.0 10/09/2017 1458    Lab Results  Component Value Date   HGBA1C 7.9 (A) 06/15/2019    Assessment & Plan:  1. Annual physical exam Counseled on 150 minutes of exercise per week, healthy eating (including decreased daily intake of saturated fats, cholesterol, added sugars, sodium), STI prevention, routine healthcare maintenance.   2. Type 2 diabetes mellitus without complication, without long-term current use of insulin (HCC) Uncontrolled with A1c of 8.1 due to noncompliance She promises to become more compliant Chronic disease management and - POCT  glucose (manual entry) - POCT glycosylated hemoglobin (Hb A1C)  3. Encounter for screening mammogram for malignant neoplasm of breast - MM 3D SCREEN BREAST BILATERAL; Future  4. Screening for STD (sexually transmitted disease) - Cervicovaginal ancillary only - POCT URINALYSIS DIP (CLINITEK)  5. Screening for cervical cancer - Cytology - PAP([place])   Follow-up: Return in about 3 months (around 12/20/2019) for Chronic medical conditions- in person.       Enobong Newlin, MD, FAAFP. [facility] [place], [place] [phone]   09/22/2019, 11:48 AM 

## 2019-09-22 NOTE — Patient Instructions (Signed)
Health Maintenance, Female Adopting a healthy lifestyle and getting preventive care are important in promoting health and wellness. Ask your health care provider about:  The right schedule for you to have regular tests and exams.  Things you can do on your own to prevent diseases and keep yourself healthy. What should I know about diet, weight, and exercise? Eat a healthy diet   Eat a diet that includes plenty of vegetables, fruits, low-fat dairy products, and lean protein.  Do not eat a lot of foods that are high in solid fats, added sugars, or sodium. Maintain a healthy weight Body mass index (BMI) is used to identify weight problems. It estimates body fat based on height and weight. Your health care provider can help determine your BMI and help you achieve or maintain a healthy weight. Get regular exercise Get regular exercise. This is one of the most important things you can do for your health. Most adults should:  Exercise for at least 150 minutes each week. The exercise should increase your heart rate and make you sweat (moderate-intensity exercise).  Do strengthening exercises at least twice a week. This is in addition to the moderate-intensity exercise.  Spend less time sitting. Even light physical activity can be beneficial. Watch cholesterol and blood lipids Have your blood tested for lipids and cholesterol at 45 years of age, then have this test every 5 years. Have your cholesterol levels checked more often if:  Your lipid or cholesterol levels are high.  You are older than 45 years of age.  You are at high risk for heart disease. What should I know about cancer screening? Depending on your health history and family history, you may need to have cancer screening at various ages. This may include screening for:  Breast cancer.  Cervical cancer.  Colorectal cancer.  Skin cancer.  Lung cancer. What should I know about heart disease, diabetes, and high blood  pressure? Blood pressure and heart disease  High blood pressure causes heart disease and increases the risk of stroke. This is more likely to develop in people who have high blood pressure readings, are of African descent, or are overweight.  Have your blood pressure checked: ? Every 3-5 years if you are 18-39 years of age. ? Every year if you are 40 years old or older. Diabetes Have regular diabetes screenings. This checks your fasting blood sugar level. Have the screening done:  Once every three years after age 40 if you are at a normal weight and have a low risk for diabetes.  More often and at a younger age if you are overweight or have a high risk for diabetes. What should I know about preventing infection? Hepatitis B If you have a higher risk for hepatitis B, you should be screened for this virus. Talk with your health care provider to find out if you are at risk for hepatitis B infection. Hepatitis C Testing is recommended for:  Everyone born from 1945 through 1965.  Anyone with known risk factors for hepatitis C. Sexually transmitted infections (STIs)  Get screened for STIs, including gonorrhea and chlamydia, if: ? You are sexually active and are younger than 45 years of age. ? You are older than 45 years of age and your health care provider tells you that you are at risk for this type of infection. ? Your sexual activity has changed since you were last screened, and you are at increased risk for chlamydia or gonorrhea. Ask your health care provider if   you are at risk.  Ask your health care provider about whether you are at high risk for HIV. Your health care provider may recommend a prescription medicine to help prevent HIV infection. If you choose to take medicine to prevent HIV, you should first get tested for HIV. You should then be tested every 3 months for as long as you are taking the medicine. Pregnancy  If you are about to stop having your period (premenopausal) and  you may become pregnant, seek counseling before you get pregnant.  Take 400 to 800 micrograms (mcg) of folic acid every day if you become pregnant.  Ask for birth control (contraception) if you want to prevent pregnancy. Osteoporosis and menopause Osteoporosis is a disease in which the bones lose minerals and strength with aging. This can result in bone fractures. If you are 65 years old or older, or if you are at risk for osteoporosis and fractures, ask your health care provider if you should:  Be screened for bone loss.  Take a calcium or vitamin D supplement to lower your risk of fractures.  Be given hormone replacement therapy (HRT) to treat symptoms of menopause. Follow these instructions at home: Lifestyle  Do not use any products that contain nicotine or tobacco, such as cigarettes, e-cigarettes, and chewing tobacco. If you need help quitting, ask your health care provider.  Do not use street drugs.  Do not share needles.  Ask your health care provider for help if you need support or information about quitting drugs. Alcohol use  Do not drink alcohol if: ? Your health care provider tells you not to drink. ? You are pregnant, may be pregnant, or are planning to become pregnant.  If you drink alcohol: ? Limit how much you use to 0-1 drink a day. ? Limit intake if you are breastfeeding.  Be aware of how much alcohol is in your drink. In the [place] one drink equals one 12 oz bottle of beer (355 mL), one 5 oz glass of wine (148 mL), or one 1 oz glass of hard liquor (44 mL). General instructions  Schedule regular health, dental, and eye exams.  Stay current with your vaccines.  Tell your health care provider if: ? You often feel depressed. ? You have ever been abused or do not feel safe at home. Summary  Adopting a healthy lifestyle and getting preventive care are important in promoting health and wellness.  Follow your health care provider's instructions about healthy  diet, exercising, and getting tested or screened for diseases.  Follow your health care provider's instructions on monitoring your cholesterol and blood pressure. This information is not intended to replace advice given to you by your health care provider. Make sure you discuss any questions you have with your health care provider. Document Revised: 07/09/2018 Document Reviewed: 07/09/2018 Elsevier Patient Education  2020 Elsevier Inc.  

## 2019-09-23 ENCOUNTER — Ambulatory Visit: Payer: Medicaid Other

## 2019-09-23 LAB — CERVICOVAGINAL ANCILLARY ONLY
Bacterial Vaginitis (gardnerella): NEGATIVE
Candida Glabrata: NEGATIVE
Candida Vaginitis: NEGATIVE
Chlamydia: NEGATIVE
Comment: NEGATIVE
Comment: NEGATIVE
Comment: NEGATIVE
Comment: NEGATIVE
Comment: NEGATIVE
Comment: NORMAL
Neisseria Gonorrhea: NEGATIVE
Trichomonas: POSITIVE — AB

## 2019-09-24 ENCOUNTER — Other Ambulatory Visit: Payer: Self-pay | Admitting: Family Medicine

## 2019-09-24 ENCOUNTER — Telehealth: Payer: Self-pay | Admitting: Family Medicine

## 2019-09-24 DIAGNOSIS — E119 Type 2 diabetes mellitus without complications: Secondary | ICD-10-CM

## 2019-09-24 LAB — CYTOLOGY - PAP
Adequacy: ABSENT
Comment: NEGATIVE
Diagnosis: NEGATIVE
High risk HPV: NEGATIVE

## 2019-09-24 MED ORDER — BYDUREON 2 MG ~~LOC~~ PEN: 2.0000 mg | PEN_INJECTOR | SUBCUTANEOUS | 3 refills | Status: DC

## 2019-09-24 NOTE — Telephone Encounter (Signed)
Refills sent

## 2019-09-24 NOTE — Telephone Encounter (Signed)
1) Medication(s) Requested (by name): -Exenatide ER (BYDUREON) 2 MG PEN   2) Pharmacy of Choice: [id number] - [place], [place] - [address] AT [facility] OF [address]

## 2019-09-25 ENCOUNTER — Other Ambulatory Visit: Payer: Self-pay | Admitting: Family Medicine

## 2019-09-25 ENCOUNTER — Telehealth: Payer: Self-pay

## 2019-09-25 DIAGNOSIS — B9689 Other specified bacterial agents as the cause of diseases classified elsewhere: Secondary | ICD-10-CM

## 2019-09-25 MED ORDER — METRONIDAZOLE 500 MG PO TABS: 500.0000 mg | ORAL_TABLET | Freq: Two times a day (BID) | ORAL | 0 refills | Status: DC

## 2019-09-25 NOTE — Telephone Encounter (Signed)
Patient name and DOB has been verified Patient was informed of lab results. Patient had no questions.  

## 2019-09-25 NOTE — Telephone Encounter (Signed)
-----   Message from Hoy Register, MD sent at 09/25/2019  8:32 AM EST ----- Please inform her labs reveal Pap smear is normal however she does have trichomonas which is an STD and I have sent a prescription for metronidazole to her pharmacy.  Her partner will need to be treated and she will need to be retested next month for resolution.

## 2019-10-27 ENCOUNTER — Ambulatory Visit: Payer: Medicaid Other

## 2019-11-10 DIAGNOSIS — F331 Major depressive disorder, recurrent, moderate: Secondary | ICD-10-CM | POA: Diagnosis not present

## 2019-11-12 DIAGNOSIS — F331 Major depressive disorder, recurrent, moderate: Secondary | ICD-10-CM | POA: Diagnosis not present

## 2019-11-16 DIAGNOSIS — F331 Major depressive disorder, recurrent, moderate: Secondary | ICD-10-CM | POA: Diagnosis not present

## 2019-11-18 DIAGNOSIS — F331 Major depressive disorder, recurrent, moderate: Secondary | ICD-10-CM | POA: Diagnosis not present

## 2019-11-23 ENCOUNTER — Telehealth: Payer: Self-pay

## 2019-11-23 NOTE — Telephone Encounter (Signed)
Patient was called and a voicemail was left informing patient to return phone call. 

## 2019-11-23 NOTE — Telephone Encounter (Signed)
Pt. Called and wanting to see if she can get her TSH check for her TSH level.

## 2019-11-30 DIAGNOSIS — F331 Major depressive disorder, recurrent, moderate: Secondary | ICD-10-CM | POA: Diagnosis not present

## 2019-12-08 ENCOUNTER — Telehealth: Payer: Self-pay | Admitting: Family Medicine

## 2019-12-08 NOTE — Telephone Encounter (Signed)
Patient called requesting a call from Alycia. Patient did not disclose the reason for the call.    Patient also stated that when she called on 12/07/19 she was on hold for over 20 min and the person that answered the phone was rude and put her back on hold for over 1 hour. I let the patient know that I would speak to the staff about the concerns she had and I let her know that next time when she called she would not experience the same situation. I also let the patient know that we are the process of transferring our calls to a call center that way the patients will not have to be on hold for a long period of time.

## 2019-12-08 NOTE — Telephone Encounter (Signed)
Patient was called and a voicemail was left informing patient to return phone call. 

## 2019-12-14 ENCOUNTER — Other Ambulatory Visit: Payer: Self-pay

## 2019-12-14 ENCOUNTER — Other Ambulatory Visit (HOSPITAL_COMMUNITY)
Admission: RE | Admit: 2019-12-14 | Discharge: 2019-12-14 | Disposition: A | Discharge status: Home / Self Care | Payer: Medicaid Other | Source: Ambulatory Visit | Attending: Family Medicine | Admitting: Family Medicine

## 2019-12-14 ENCOUNTER — Encounter: Payer: Self-pay | Admitting: Family Medicine

## 2019-12-14 ENCOUNTER — Ambulatory Visit: Payer: Medicaid Other | Attending: Family Medicine | Admitting: Family Medicine

## 2019-12-14 VITALS — BP 148/80 | HR 73 | Ht 68.0 in | Wt 367.0 lb

## 2019-12-14 DIAGNOSIS — E559 Vitamin D deficiency, unspecified: Secondary | ICD-10-CM | POA: Insufficient documentation

## 2019-12-14 DIAGNOSIS — N898 Other specified noninflammatory disorders of vagina: Secondary | ICD-10-CM | POA: Diagnosis not present

## 2019-12-14 DIAGNOSIS — E119 Type 2 diabetes mellitus without complications: Secondary | ICD-10-CM | POA: Diagnosis present

## 2019-12-14 DIAGNOSIS — Z8261 Family history of arthritis: Secondary | ICD-10-CM | POA: Insufficient documentation

## 2019-12-14 DIAGNOSIS — Z8619 Personal history of other infectious and parasitic diseases: Secondary | ICD-10-CM | POA: Diagnosis not present

## 2019-12-14 DIAGNOSIS — I10 Essential (primary) hypertension: Secondary | ICD-10-CM | POA: Diagnosis not present

## 2019-12-14 DIAGNOSIS — E1169 Type 2 diabetes mellitus with other specified complication: Secondary | ICD-10-CM | POA: Diagnosis not present

## 2019-12-14 DIAGNOSIS — M7700 Medial epicondylitis, unspecified elbow: Secondary | ICD-10-CM | POA: Diagnosis not present

## 2019-12-14 DIAGNOSIS — M7501 Adhesive capsulitis of right shoulder: Secondary | ICD-10-CM | POA: Insufficient documentation

## 2019-12-14 DIAGNOSIS — R600 Localized edema: Secondary | ICD-10-CM | POA: Diagnosis not present

## 2019-12-14 DIAGNOSIS — Z8249 Family history of ischemic heart disease and other diseases of the circulatory system: Secondary | ICD-10-CM | POA: Insufficient documentation

## 2019-12-14 DIAGNOSIS — Z6841 Body Mass Index (BMI) 40.0 and over, adult: Secondary | ICD-10-CM | POA: Diagnosis not present

## 2019-12-14 DIAGNOSIS — Z79899 Other long term (current) drug therapy: Secondary | ICD-10-CM | POA: Diagnosis not present

## 2019-12-14 DIAGNOSIS — Z800 Family history of malignant neoplasm of digestive organs: Secondary | ICD-10-CM | POA: Diagnosis not present

## 2019-12-14 LAB — POCT URINALYSIS DIP (CLINITEK)
Bilirubin, UA: NEGATIVE
Blood, UA: NEGATIVE
Glucose, UA: NEGATIVE mg/dL
Ketones, POC UA: NEGATIVE mg/dL
Leukocytes, UA: NEGATIVE
Nitrite, UA: NEGATIVE
POC PROTEIN,UA: NEGATIVE
Spec Grav, UA: >=1.03 — AB (ref 1.010–1.025)
Urobilinogen, UA: 0.2 E.U./dL
pH, UA: 5.5 (ref 5.0–8.0)

## 2019-12-14 LAB — POCT GLYCOSYLATED HEMOGLOBIN (HGB A1C): HbA1c, POC (controlled diabetic range): 7.9 % — AB (ref 0.0–7.0)

## 2019-12-14 LAB — GLUCOSE, POCT (MANUAL RESULT ENTRY): POC Glucose: 200 mg/dl — AB (ref 70–99)

## 2019-12-14 MED ORDER — METHYLPREDNISOLONE 4 MG PO TBPK: ORAL_TABLET | ORAL | 0 refills | Status: DC

## 2019-12-14 MED ORDER — HYDROCHLOROTHIAZIDE 25 MG PO TABS: 25.0000 mg | ORAL_TABLET | Freq: Every day | ORAL | 3 refills | Status: DC

## 2019-12-14 MED ORDER — GLIPIZIDE 5 MG PO TABS: 5.0000 mg | ORAL_TABLET | Freq: Two times a day (BID) | ORAL | 6 refills | Status: DC

## 2019-12-14 MED ORDER — ATORVASTATIN CALCIUM 20 MG PO TABS: 20.0000 mg | ORAL_TABLET | Freq: Every day | ORAL | 6 refills | Status: DC

## 2019-12-14 MED ORDER — MELOXICAM 7.5 MG PO TABS: 7.5000 mg | ORAL_TABLET | Freq: Every day | ORAL | 1 refills | Status: DC

## 2019-12-14 NOTE — Patient Instructions (Signed)
Adhesive Capsulitis  Adhesive capsulitis, also called frozen shoulder, causes the shoulder to become stiff and painful to move. This condition happens when there is inflammation of the tendons and ligaments that surround the shoulder joint (shoulder capsule). What are the causes? This condition may be caused by:  An injury to your shoulder joint.  Straining your shoulder.  Not moving your shoulder for a period of time. This can happen if your arm was injured or in a sling.  Long-standing conditions, such as: ? Diabetes. ? Thyroid problems. ? Heart disease. ? Stroke. ? Rheumatoid arthritis. ? Lung disease. In some cases, the cause is not known. What increases the risk? You are more likely to develop this condition if you are:  A woman.  Older than 45 years of age. What are the signs or symptoms? Symptoms of this condition include:  Pain in your shoulder when you move your arm. There may also be pain when parts of your shoulder are touched. The pain may be worse at night or when you are resting.  A sore or aching shoulder.  The inability to move your shoulder normally.  Muscle spasms. How is this diagnosed? This condition is diagnosed with a physical exam and imaging tests, such as an X-ray or MRI. How is this treated? This condition may be treated with:  Treatment of the underlying cause or condition.  Medicine. Medicine may be given to relieve pain, inflammation, or muscle spasms.  Steroid injections into the shoulder joint.  Physical therapy. This involves performing exercises to get the shoulder moving again.  Acupuncture. This is a type of treatment that involves stimulating specific points on your body by inserting thin needles through your skin.  Shoulder manipulation. This is a procedure to move the shoulder into another position. It is done after you are given a medicine to make you fall asleep (general anesthetic). The joint may also be injected with salt  water at high pressure to break down scarring.  Surgery. This may be done in severe cases when other treatments have failed. Although most people recover completely from adhesive capsulitis, some may not regain full shoulder movement. Follow these instructions at home: Managing pain, stiffness, and swelling      If directed, put ice on the injured area: ? Put ice in a plastic bag. ? Place a towel between your skin and the bag. ? Leave the ice on for 20 minutes, 2-3 times per day.  If directed, apply heat to the affected area before you exercise. Use the heat source that your health care provider recommends, such as a moist heat pack or a heating pad. ? Place a towel between your skin and the heat source. ? Leave the heat on for 20-30 minutes. ? Remove the heat if your skin turns bright red. This is especially important if you are unable to feel pain, heat, or cold. You may have a greater risk of getting burned. General instructions  Take over-the-counter and prescription medicines only as told by your health care provider.  If you are being treated with physical therapy, follow instructions from your physical therapist.  Avoid exercises that put a lot of demand on your shoulder, such as throwing. These exercises can make pain worse.  Keep all follow-up visits as told by your health care provider. This is important. Contact a health care provider if:  You develop new symptoms.  Your symptoms get worse. Summary  Adhesive capsulitis, also called frozen shoulder, causes the shoulder to become   stiff and painful to move.  You are more likely to have this condition if you are a woman and over age 40.  It is treated with physical therapy, medicines, and sometimes surgery. This information is not intended to replace advice given to you by your health care provider. Make sure you discuss any questions you have with your health care provider. Document Revised: 12/20/2017 Document  Reviewed: 12/20/2017 Elsevier Patient Education  2020 Elsevier Inc.  

## 2019-12-14 NOTE — Progress Notes (Signed)
Subjective:  Patient ID: Nicole [name], female    DOB: Jul 20, 1976  Age: 44 y.o. MRN: [id number]  CC: Diabetes   HPI Nicole [name]  is a 44 year old female with morbid obesity, Type 2 DM (A1c7.9), osteoarthritis of the knee, tobacco abusehere for follow-up of chronic medical conditions She has pain in the medial aspect of both forearms and R shoulder with difficulty elevating her R arm . Pain is worse through the day and after she has not used her arms to perform any physical work.  She is right-handed. She has no associated tingling.  She is not open to trying Metformin for her diabetes due to concerns of GI side effects but she has been compliant with her Exanetide.  Not compliant with Lipitor because she moved and must have lost her medication in the process and never refilled it. She does not get much exercise. Complains of urine having a burning smell. No dysuria or abdominal pain. She has urinary frequency and polyuria but no fever or flank pain. She was treated for trichomonas in 08/2019 and her partner received treatment as well.  Past Medical History:  Diagnosis Date  . Arthritis    knees  . Hypertension   . Ovarian cyst   . Pregnancy induced hypertension     Past Surgical History:  Procedure Laterality Date  . APPENDECTOMY    . CESAREAN SECTION    . HERNIA REPAIR    . LAPAROSCOPIC APPENDECTOMY N/A 03/25/2014   Procedure: APPENDECTOMY LAPAROSCOPIC;  Surgeon: Gwenyth Ober, MD;  Location: [facility] OR;  Service: General;  Laterality: N/A;    Family History  Problem Relation Age of Onset  . Hypertension Mother   . Cancer Mother        pancreatic cancer  . Arthritis Father   . Heart disease Maternal Grandmother     No Known Allergies  Outpatient Medications Prior to Visit  Medication Sig Dispense Refill  . Exenatide ER (BYDUREON) 2 MG PEN Inject 2 mg into the skin once a week. 4 each 3  . albuterol (VENTOLIN HFA) 108 (90 Base) MCG/ACT inhaler Inhale 2 puffs into the  lungs every 6 (six) hours as needed for wheezing or shortness of breath. (Patient not taking: Reported on 12/14/2019) 18 g 6  . Aspirin-Salicylamide-Caffeine (BC HEADACHE POWDER PO) Take 1 packet by mouth 2 (two) times daily as needed (headaches).    Marland Kitchen atorvastatin (LIPITOR) 20 MG tablet Take 1 tablet (20 mg total) by mouth daily. (Patient not taking: Reported on 12/14/2019) 30 tablet 3  . Blood Glucose Monitoring Suppl (ACCU-CHEK AVIVA) device Use as instructed daily. (Patient not taking: Reported on 09/22/2019) 1 each 0  . glucose blood (ACCU-CHEK AVIVA) test strip Use daily (Patient not taking: Reported on 09/22/2019) 30 each 12  . hydrOXYzine (ATARAX/VISTARIL) 10 MG tablet Take 1 tablet (10 mg total) by mouth 2 (two) times daily as needed. (Patient not taking: Reported on 09/22/2019) 60 tablet 6  . Lancets Misc. (ACCU-CHEK SOFTCLIX LANCET DEV) KIT 1 each by Does not apply route daily. (Patient not taking: Reported on 09/22/2019) 1 kit 12  . meloxicam (MOBIC) 7.5 MG tablet Take 1 tablet (7.5 mg total) by mouth daily. (Patient not taking: Reported on 09/22/2019) 30 tablet 1  . methylPREDNISolone (MEDROL DOSEPAK) 4 MG TBPK tablet 6 day dose pack - take as directed (Patient not taking: Reported on 09/22/2019) 21 tablet 0  . metroNIDAZOLE (FLAGYL) 500 MG tablet Take 1 tablet (500 mg total) by mouth  2 (two) times daily. (Patient not taking: Reported on 12/14/2019) 14 tablet 0  . norgestimate-ethinyl estradiol (SPRINTEC 28) 0.25-35 MG-MCG tablet Take 1 tablet by mouth daily. (Patient not taking: Reported on 09/22/2019) 1 Package 11  . tiotropium (SPIRIVA HANDIHALER) 18 MCG inhalation capsule Place 1 capsule (18 mcg total) into inhaler and inhale daily. (Patient not taking: Reported on 12/14/2019) 30 capsule 6  . Vitamin D, Ergocalciferol, (DRISDOL) 1.25 MG (50000 UT) CAPS capsule Take 1 capsule (50,000 Units total) by mouth every 7 (seven) days. (Patient not taking: Reported on 09/22/2019) 16 capsule 0   No  facility-administered medications prior to visit.     ROS Review of Systems  Constitutional: Negative for activity change, appetite change and fatigue.  HENT: Negative for congestion, sinus pressure and sore throat.   Eyes: Negative for visual disturbance.  Respiratory: Negative for cough, chest tightness, shortness of breath and wheezing.   Cardiovascular: Negative for chest pain and palpitations.  Gastrointestinal: Negative for abdominal distention, abdominal pain and constipation.  Endocrine: Negative for polydipsia.  Genitourinary: Positive for frequency. Negative for dysuria.  Musculoskeletal:       See HPI  Skin: Negative for rash.  Neurological: Negative for tremors, light-headedness and numbness.  Hematological: Does not bruise/bleed easily.  Psychiatric/Behavioral: Negative for agitation and behavioral problems.    Objective:  BP (!) 148/80   Pulse 73   Ht 5' 8"  (1.727 m)   Wt (!) 367 lb (166.5 kg)   SpO2 97%   BMI 55.80 kg/m   BP/Weight 12/14/2019 09/22/2019 17/51/0258  Systolic BP 527 782 423  Diastolic BP 80 90 82  Wt. (Lbs) 367 366 -  BMI 55.8 55.65 -      Physical Exam Constitutional:      Appearance: She is well-developed.  Neck:     Vascular: No JVD.  Cardiovascular:     Rate and Rhythm: Normal rate.     Heart sounds: Normal heart sounds. No murmur.  Pulmonary:     Effort: Pulmonary effort is normal.     Breath sounds: Normal breath sounds. No wheezing or rales.  Chest:     Chest wall: No tenderness.  Abdominal:     General: Bowel sounds are normal. There is no distension.     Palpations: Abdomen is soft. There is no mass.     Tenderness: There is no abdominal tenderness.  Musculoskeletal:        General: Normal range of motion.     Right lower leg: Edema (2+ non pitting) present.     Left lower leg: Edema (2+ non  pitting) present.     Comments: Tenderness to palpation of anterior right shoulder joint with abduction restricted to 60  degrees No tenderness on palpation of the lateral elbow and epicondyles with normal range of motion in both elbows Normal handgrip bilaterally  Neurological:     Mental Status: She is alert and oriented to person, place, and time.  Psychiatric:        Mood and Affect: Mood normal.     CMP Latest Ref Rng & Units 06/15/2019 10/09/2017 12/08/2016  Glucose 65 - 99 mg/dL 174(H) 93 125(H)  BUN 6 - 24 mg/dL 11 14 10   Creatinine 0.57 - 1.00 mg/dL 0.90 1.15(H) 0.89  Sodium 134 - 144 mmol/L 140 140 135  Potassium 3.5 - 5.2 mmol/L 4.9 4.9 3.8  Chloride 96 - 106 mmol/L 102 104 106  CO2 20 - 29 mmol/L 24 23 24   Calcium 8.7 -  10.2 mg/dL 9.7 9.2 8.6(L)  Total Protein 6.0 - 8.5 g/dL 7.1 7.0 -  Total Bilirubin 0.0 - 1.2 mg/dL 0.2 0.2 -  Alkaline Phos 39 - 117 IU/L 127(H) 86 -  AST 0 - 40 IU/L 30 22 -  ALT 0 - 32 IU/L 34(H) 20 -    Lipid Panel     Component Value Date/Time   CHOL 177 09/06/2016 1018   TRIG 97 09/06/2016 1018   HDL 47 (L) 09/06/2016 1018   CHOLHDL 3.8 09/06/2016 1018    CBC    Component Value Date/Time   WBC 11.3 (H) 10/09/2017 1458   WBC 9.7 12/08/2016 1453   RBC 4.96 10/09/2017 1458   RBC 4.76 12/08/2016 1453   HGB 12.9 10/09/2017 1458   HCT 40.4 10/09/2017 1458   PLT 260 10/09/2017 1458   MCV 82 10/09/2017 1458   MCH 26.0 (L) 10/09/2017 1458   MCH 26.3 12/08/2016 1453   MCHC 31.9 10/09/2017 1458   MCHC 32.0 12/08/2016 1453   RDW 14.4 10/09/2017 1458   LYMPHSABS 3.2 (H) 10/09/2017 1458   MONOABS 342 09/06/2016 1018   EOSABS 0.2 10/09/2017 1458   BASOSABS 0.0 10/09/2017 1458    Lab Results  Component Value Date   HGBA1C 7.9 (A) 12/14/2019     Assessment & Plan:    1. Type 2 diabetes mellitus with other specified complication, without long-term current use of insulin (HCC) Not fully optimized with A1c of 7.9; goal is less than 7 Glipizide added to regimen Counseled on Diabetic diet, my plate method, 681 minutes of moderate intensity  exercise/week Blood sugar logs with fasting goals of 80-120 mg/dl, random of less than 180 and in the event of sugars less than 60 mg/dl or greater than 400 mg/dl encouraged to notify the clinic. Advised on the need for annual eye exams, annual foot exams, Pneumonia vaccine. - POCT glucose (manual entry) - POCT glycosylated hemoglobin (Hb A1C) - Lipid panel; Future - atorvastatin (LIPITOR) 20 MG tablet; Take 1 tablet (20 mg total) by mouth daily.  Dispense: 30 tablet; Refill: 6 - Microalbumin / creatinine urine ratio  2. Vaginal odor Previous history of treatment for Trich Will send off Cervicovaginal ancillary tests UA negative for UTI - POCT URINALYSIS DIP (CLINITEK)  3. Morbid obesity ([place]) Discussed the need to increase physical activity and reduce portion sizes aim for 150 mins of moderate intensity exercise per week - meloxicam (MOBIC) 7.5 MG tablet; Take 1 tablet (7.5 mg total) by mouth daily.  Dispense: 30 tablet; Refill: 1  4. Essential hypertension New diagnosis We will commence HCTZ Counseled on blood pressure goal of less than 130/80, low-sodium, DASH diet, medication compliance, 150 minutes of moderate intensity exercise per week. Discussed medication compliance, adverse effects. - hydrochlorothiazide (HYDRODIURIL) 25 MG tablet; Take 1 tablet (25 mg total) by mouth daily.  Dispense: 30 tablet; Refill: 3  5. 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  Dispense: 30 tablet; Refill: 3  6. History of trichomoniasis Treated and partner was treated in 08/2019 - Cervicovaginal ancillary only  7. Vitamin D deficiency History of vitamin D deficiency We will recheck - VITAMIN D 25 Hydroxy (Vit-D Deficiency, Fractures); Future  8. Adhesive capsulitis of right shoulder Discussed physical therapy but she  would like to hold off at this time Once she completes her short course  of Medrol Dosepak she will commence meloxicam - meloxicam (MOBIC) 7.5 MG tablet; Take 1 tablet (7.5 mg total) by mouth daily.  Dispense: 30 tablet; Refill: 1 - methylPREDNISolone (MEDROL DOSEPAK) 4 MG TBPK tablet; 6 day dose pack - take as directed  Dispense: 21 tablet; Refill: 0  9. Medial epicondylitis of elbow, unspecified laterality - meloxicam (MOBIC) 7.5 MG tablet; Take 1 tablet (7.5 mg total) by mouth daily.  Dispense: 30 tablet; Refill: 1 - methylPREDNISolone (MEDROL DOSEPAK) 4 MG TBPK tablet; 6 day dose pack - take as directed  Dispense: 21 tablet; Refill: 0       [name], MD, FAAFP. [facility], [place]   12/14/2019, 11:18 AM

## 2019-12-14 NOTE — Progress Notes (Signed)
Wants to re test for recent STD. Having pain in both arm and can not lift the right arm over her head. Sates that her urine has a foul odor.

## 2019-12-15 ENCOUNTER — Ambulatory Visit: Payer: Medicaid Other | Attending: Family Medicine

## 2019-12-15 DIAGNOSIS — E559 Vitamin D deficiency, unspecified: Secondary | ICD-10-CM | POA: Diagnosis not present

## 2019-12-15 DIAGNOSIS — E1169 Type 2 diabetes mellitus with other specified complication: Secondary | ICD-10-CM

## 2019-12-15 LAB — CERVICOVAGINAL ANCILLARY ONLY
Bacterial Vaginitis (gardnerella): POSITIVE — AB
Candida Glabrata: NEGATIVE
Candida Vaginitis: NEGATIVE
Chlamydia: NEGATIVE
Comment: NEGATIVE
Comment: NEGATIVE
Comment: NEGATIVE
Comment: NEGATIVE
Comment: NEGATIVE
Comment: NORMAL
Neisseria Gonorrhea: NEGATIVE
Trichomonas: NEGATIVE

## 2019-12-15 LAB — MICROALBUMIN / CREATININE URINE RATIO
Creatinine, Urine: 119 mg/dL
Microalb/Creat Ratio: 28 mg/g creat (ref 0–29)
Microalbumin, Urine: 33.7 ug/mL

## 2019-12-16 ENCOUNTER — Other Ambulatory Visit: Payer: Self-pay | Admitting: Family Medicine

## 2019-12-16 LAB — LIPID PANEL
Chol/HDL Ratio: 3 ratio (ref 0.0–4.4)
Cholesterol, Total: 172 mg/dL (ref 100–199)
HDL: 58 mg/dL (ref >39)
LDL Chol Calc (NIH): 94 mg/dL (ref 0–99)
Triglycerides: 111 mg/dL (ref 0–149)
VLDL Cholesterol Cal: 20 mg/dL (ref 5–40)

## 2019-12-16 LAB — VITAMIN D 25 HYDROXY (VIT D DEFICIENCY, FRACTURES): Vit D, 25-Hydroxy: 13.5 ng/mL — ABNORMAL LOW (ref 30.0–100.0)

## 2019-12-16 MED ORDER — METRONIDAZOLE 0.75 % VA GEL: 1.0000 | Freq: Every day | VAGINAL | 0 refills | Status: DC

## 2019-12-16 MED ORDER — VITAMIN D (ERGOCALCIFEROL) 1.25 MG (50000 UNIT) PO CAPS: 50000.0000 [IU] | ORAL_CAPSULE | ORAL | 0 refills | Status: DC

## 2019-12-16 NOTE — Progress Notes (Signed)
Patient has viewed results on 12/16/2019 at 9:12

## 2019-12-21 DIAGNOSIS — F331 Major depressive disorder, recurrent, moderate: Secondary | ICD-10-CM | POA: Diagnosis not present

## 2019-12-29 DIAGNOSIS — F331 Major depressive disorder, recurrent, moderate: Secondary | ICD-10-CM | POA: Diagnosis not present

## 2019-12-30 ENCOUNTER — Ambulatory Visit: Payer: Medicaid Other | Admitting: Family Medicine

## 2020-01-18 ENCOUNTER — Ambulatory Visit: Payer: Medicaid Other | Admitting: Family Medicine

## 2020-03-15 ENCOUNTER — Ambulatory Visit: Payer: Medicaid Other | Admitting: Family Medicine

## 2020-04-18 ENCOUNTER — Ambulatory Visit: Payer: Self-pay | Admitting: *Deleted

## 2020-04-18 NOTE — Telephone Encounter (Signed)
Patient calls with shortness of breath regardless of activity or resting. Has been occurring for about 2 weeks to one month.Has a feeling of being "air-headed-floating" at times. Occasional  wheeze Reports she has COPD.Denies CP/Fever/body aches. Has a Covid negative test from yesterday. Will not go to ED at this time. Requesting a virtual/MyChart video visit with pcp or other. No appointments avail thru September. Routing to pcp for consideration for appointment and or if possible albuterol inhaler to be sent to her pharmacy [facility] on [address]. Reviewed care advice with patient. Verbalized understanding.  Reason for Disposition . [1] MODERATE longstanding difficulty breathing (e.g., speaks in phrases, SOB even at rest, pulse 100-120) AND [2] SAME as normal  Answer Assessment - Initial Assessment Questions 1. RESPIRATORY STATUS: "Describe your breathing?" (e.g., wheezing, shortness of breath, unable to speak, severe coughing)      Shortness of breath 2. ONSET: "When did this breathing problem begin?"      About 2 weeks ago 3. PATTERN "Does the difficult breathing come and go, or has it been constant since it started?"     constant 4. SEVERITY: "How bad is your breathing?" (e.g., mild, moderate, severe)    - MILD: No SOB at rest, mild SOB with walking, speaks normally in sentences, can lay down, no retractions, pulse < 100.    - MODERATE: SOB at rest, SOB with minimal exertion and prefers to sit, cannot lie down flat, speaks in phrases, mild retractions, audible wheezing, pulse 100-120.    - SEVERE: Very SOB at rest, speaks in single words, struggling to breathe, sitting hunched forward, retractions, pulse > 120      Moderate-pushes herself to do things5. RECURRENT SYMPTOM: "Have you had difficulty breathing before?" If Yes, ask: "When was the last time?" and "What happened that time?"      IAC HISTORY: "Do you have any history of heart disease?" (e.g., heart attack, angina, bypass surgery,  angioplasty)      no 7. LUNG HISTORY: "Do you have any history of lung disease?"  (e.g., pulmonary embolus, asthma, emphysema)     COPD 8. CAUSE: "What do you think is causing the breathing problem?"      No idea 9. OTHER SYMPTOMS: "Do you have any other symptoms? (e.g., dizziness, runny nose, cough, chest pain, fever)     dizziness 10. PREGNANCY: "Is there any chance you are pregnant?" "When was your last menstrual period?"       no 11. TRAVEL: "Have you traveled out of the country in the last month?" (e.g., travel history, exposures)       no  Protocols used: BREATHING DIFFICULTY-A-AH

## 2020-04-20 ENCOUNTER — Other Ambulatory Visit: Payer: Self-pay

## 2020-04-20 DIAGNOSIS — J9801 Acute bronchospasm: Secondary | ICD-10-CM

## 2020-04-20 MED ORDER — ALBUTEROL SULFATE HFA 108 (90 BASE) MCG/ACT IN AERS: 2.0000 | INHALATION_SPRAY | Freq: Four times a day (QID) | RESPIRATORY_TRACT | 6 refills | Status: DC | PRN

## 2020-04-21 NOTE — Telephone Encounter (Signed)
Patient was called and inhaler was sent over to requested pharmacy.

## 2020-05-11 ENCOUNTER — Encounter (HOSPITAL_COMMUNITY): Payer: Self-pay | Admitting: Emergency Medicine

## 2020-05-11 ENCOUNTER — Emergency Department (HOSPITAL_COMMUNITY): Payer: Medicaid Other

## 2020-05-11 ENCOUNTER — Ambulatory Visit (HOSPITAL_COMMUNITY)
Admission: EM | Admit: 2020-05-11 | Discharge: 2020-05-11 | Discharge status: Against Medical Advice | Payer: Medicaid Other

## 2020-05-11 ENCOUNTER — Emergency Department (HOSPITAL_COMMUNITY)
Admission: EM | Admit: 2020-05-11 | Discharge: 2020-05-11 | Disposition: A | Discharge status: Home / Self Care | Payer: Medicaid Other | Attending: Emergency Medicine | Admitting: Emergency Medicine

## 2020-05-11 DIAGNOSIS — R0602 Shortness of breath: Secondary | ICD-10-CM | POA: Diagnosis not present

## 2020-05-11 DIAGNOSIS — E119 Type 2 diabetes mellitus without complications: Secondary | ICD-10-CM | POA: Diagnosis not present

## 2020-05-11 DIAGNOSIS — Z79899 Other long term (current) drug therapy: Secondary | ICD-10-CM | POA: Diagnosis not present

## 2020-05-11 DIAGNOSIS — Z20822 Contact with and (suspected) exposure to covid-19: Secondary | ICD-10-CM | POA: Diagnosis not present

## 2020-05-11 DIAGNOSIS — I10 Essential (primary) hypertension: Secondary | ICD-10-CM | POA: Insufficient documentation

## 2020-05-11 DIAGNOSIS — Z7982 Long term (current) use of aspirin: Secondary | ICD-10-CM | POA: Diagnosis not present

## 2020-05-11 DIAGNOSIS — J441 Chronic obstructive pulmonary disease with (acute) exacerbation: Secondary | ICD-10-CM | POA: Insufficient documentation

## 2020-05-11 DIAGNOSIS — J449 Chronic obstructive pulmonary disease, unspecified: Secondary | ICD-10-CM | POA: Diagnosis not present

## 2020-05-11 DIAGNOSIS — Z7984 Long term (current) use of oral hypoglycemic drugs: Secondary | ICD-10-CM | POA: Insufficient documentation

## 2020-05-11 DIAGNOSIS — F17210 Nicotine dependence, cigarettes, uncomplicated: Secondary | ICD-10-CM | POA: Diagnosis not present

## 2020-05-11 LAB — RESPIRATORY PANEL BY RT PCR (FLU A&B, COVID)
Influenza A by PCR: NEGATIVE
Influenza B by PCR: NEGATIVE
SARS Coronavirus 2 by RT PCR: NEGATIVE

## 2020-05-11 MED ORDER — PREDNISONE 20 MG PO TABS
60.0000 mg | ORAL_TABLET | Freq: Once | ORAL | Status: AC
  Administered 2020-05-11: 60 mg via ORAL
  Filled 2020-05-11: qty 3

## 2020-05-11 MED ORDER — IPRATROPIUM BROMIDE HFA 17 MCG/ACT IN AERS
2.0000 | INHALATION_SPRAY | Freq: Once | RESPIRATORY_TRACT | Status: AC
  Administered 2020-05-11: 2 via RESPIRATORY_TRACT
  Filled 2020-05-11: qty 12.9

## 2020-05-11 MED ORDER — PREDNISONE 20 MG PO TABS: 60.0000 mg | ORAL_TABLET | Freq: Every day | ORAL | 0 refills | Status: DC

## 2020-05-11 MED ORDER — ALBUTEROL SULFATE HFA 108 (90 BASE) MCG/ACT IN AERS
4.0000 | INHALATION_SPRAY | Freq: Once | RESPIRATORY_TRACT | Status: AC
  Administered 2020-05-11: 4 via RESPIRATORY_TRACT
  Filled 2020-05-11: qty 6.7

## 2020-05-11 NOTE — ED Notes (Signed)
Pulse ox checked with ambulation o2 98-100% on room air, pulse 80-90, Pt c/o SHOB with ambulation.

## 2020-05-11 NOTE — ED Notes (Signed)
Pt dc home per MD order. Discharge summary reviewed with pt, pt verbalizes understanding. Off unit via WC- No s/s of acute distress noted.

## 2020-05-11 NOTE — ED Provider Notes (Signed)
[facility] EMERGENCY DEPARTMENT Provider Note   CSN: [id number] Arrival date & time: 05/11/20  0944     History Chief Complaint  Patient presents with  . Shortness of Breath    Nicole [name] is a 44 y.o. female.  The history is provided by the patient and medical records. No language interpreter was used.  Shortness of Breath    44 year old female with hx of COPD, chronic bronchitis, tobacco abuse presenting for evaluation of shortness of breath.  Patient states that several days ago she decided to quit smoking.  She decided to use marijuana instead.  After heavy marijuana use, she report having strong desire to smoke and resume smoking.  States that she may have been smoking too many cigarettes thus contributing to her shortness of breath.  States that she has been feeling increased shortness of breath and wheezing since yesterday.  She tries using her as needed rescue inhaler every 6 hours without adequate relief.  Last use was 2 AM this morning.  She denies any associated fever chills no runny nose sneezing sore throat loss of taste or smell nausea vomiting diarrhea.  She has not been vaccinated for COVID-19.  She denies any recent sick contact.  She is here requesting for breathing treatment as it has helped her in the past.  She has never been admitted for COPD exacerbation.   Past Medical History:  Diagnosis Date  . Arthritis    knees  . Hypertension   . Ovarian cyst   . Pregnancy induced hypertension     Patient Active Problem List   Diagnosis Date Noted  . Anxiety and depression 06/11/2018  . Vitamin D deficiency 01/08/2018  . Osteoarthritis 01/08/2018  . Type 2 diabetes mellitus ([place]) 01/08/2018  . Morbid obesity ([place]) 09/06/2016  . Acute appendicitis 03/25/2014  . Hypertension 03/25/2014    Past Surgical History:  Procedure Laterality Date  . APPENDECTOMY    . CESAREAN SECTION    . HERNIA REPAIR    . LAPAROSCOPIC APPENDECTOMY N/A  03/25/2014   Procedure: APPENDECTOMY LAPAROSCOPIC;  Surgeon: Gwenyth Ober, MD;  Location: [facility] OR;  Service: General;  Laterality: N/A;     OB History    Gravida  2   Para  2   Term  1   Preterm  1   AB  0   Living  2     SAB      TAB      Ectopic      Multiple      Live Births  2           Family History  Problem Relation Age of Onset  . Hypertension Mother   . Cancer Mother        pancreatic cancer  . Arthritis Father   . Heart disease Maternal Grandmother     Social History   Tobacco Use  . Smoking status: Current Every Day Smoker    Packs/day: 0.50    Years: 27.00    Pack years: 13.50    Types: Cigarettes  . Smokeless tobacco: Never Used  . Tobacco comment: 6 cigs daily  Vaping Use  . Vaping Use: Never used  Substance Use Topics  . Alcohol use: Yes    Comment: occ  . Drug use: Yes    Frequency: 7.0 times per week    Types: Marijuana    Home Medications Prior to Admission medications   Medication Sig Start Date End  Date Taking? Authorizing Provider  albuterol (VENTOLIN HFA) 108 (90 Base) MCG/ACT inhaler Inhale 2 puffs into the lungs every 6 (six) hours as needed for wheezing or shortness of breath. 04/20/20   [name], MD  Aspirin-Salicylamide-Caffeine (BC HEADACHE POWDER PO) Take 1 packet by mouth 2 (two) times daily as needed (headaches).    [provider]  atorvastatin (LIPITOR) 20 MG tablet Take 1 tablet (20 mg total) by mouth daily. 12/14/19   [name], MD  Blood Glucose Monitoring Suppl (ACCU-CHEK AVIVA) device Use as instructed daily. Patient not taking: Reported on 09/22/2019 01/08/18   [name], MD  Exenatide ER (BYDUREON) 2 MG PEN Inject 2 mg into the skin once a week. 09/24/19   [name], MD  glipiZIDE (GLUCOTROL) 5 MG tablet Take 1 tablet (5 mg total) by mouth 2 (two) times daily before a meal. 12/14/19   [name], MD  glucose blood (ACCU-CHEK AVIVA) test strip Use daily Patient not taking:  Reported on 09/22/2019 01/08/18   [name], MD  hydrochlorothiazide (HYDRODIURIL) 25 MG tablet Take 1 tablet (25 mg total) by mouth daily. 12/14/19   [name], MD  hydrOXYzine (ATARAX/VISTARIL) 10 MG tablet Take 1 tablet (10 mg total) by mouth 2 (two) times daily as needed. Patient not taking: Reported on 09/22/2019 06/15/19   [name], MD  Lancets Misc. (ACCU-CHEK SOFTCLIX LANCET DEV) KIT 1 each by Does not apply route daily. Patient not taking: Reported on 09/22/2019 01/08/18   [name], MD  meloxicam (MOBIC) 7.5 MG tablet Take 1 tablet (7.5 mg total) by mouth daily. 12/14/19   [name], MD  methylPREDNISolone (MEDROL DOSEPAK) 4 MG TBPK tablet 6 day dose pack - take as directed 12/14/19   [name], MD  metroNIDAZOLE (FLAGYL) 500 MG tablet Take 1 tablet (500 mg total) by mouth 2 (two) times daily. Patient not taking: Reported on 12/14/2019 09/25/19   [name], MD  metroNIDAZOLE (METROGEL VAGINAL) 0.75 % vaginal gel Place 1 Applicatorful vaginally at bedtime. 12/16/19   [name], MD  norgestimate-ethinyl estradiol (SPRINTEC 28) 0.25-35 MG-MCG tablet Take 1 tablet by mouth daily. Patient not taking: Reported on 09/22/2019 06/11/18   [name], MD  tiotropium (SPIRIVA HANDIHALER) 18 MCG inhalation capsule Place 1 capsule (18 mcg total) into inhaler and inhale daily. Patient not taking: Reported on 12/14/2019 06/15/19   [name], MD  Vitamin D, Ergocalciferol, (DRISDOL) 1.25 MG (50000 UNIT) CAPS capsule Take 1 capsule (50,000 Units total) by mouth every 7 (seven) days. 12/16/19   [name], MD    Allergies    Patient has no known allergies.  Review of Systems   Review of Systems  Respiratory: Positive for shortness of breath.   All other systems reviewed and are negative.   Physical Exam Updated Vital Signs BP (!) 154/84   Pulse 77   Temp (!) 97.5 F (36.4 C)   Resp (!) 23   Ht 5' 8"  (1.727 m)   Wt (!) 161.5 kg   SpO2 100%    BMI 54.13 kg/m   Physical Exam Vitals and nursing note reviewed.  Constitutional:      General: She is not in acute distress.    Appearance: She is well-developed. She is obese.     Comments: Morbidly obese appears to be in no acute respiratory discomfort.  HENT:     Head: Atraumatic.  Eyes:     Conjunctiva/sclera: Conjunctivae normal.  Cardiovascular:     Rate and Rhythm: Normal rate  and regular rhythm.  Pulmonary:     Effort: Tachypnea present. No respiratory distress.     Breath sounds: No stridor. Decreased breath sounds and wheezing present. No rhonchi or rales.  Chest:     Chest wall: No tenderness.  Abdominal:     Palpations: Abdomen is soft.  Musculoskeletal:     Cervical back: Neck supple.     Right lower leg: No edema.     Left lower leg: No edema.  Skin:    Findings: No rash.  Neurological:     Mental Status: She is alert and oriented to person, place, and time.  Psychiatric:        Mood and Affect: Mood normal.     ED Results / Procedures / Treatments   Labs (all labs ordered are listed, but only abnormal results are displayed) Labs Reviewed  RESPIRATORY PANEL BY RT PCR (FLU A&B, COVID)    EKG None   Date: 05/11/2020  Rate: 76  Rhythm: normal sinus rhythm  QRS Axis: normal  Intervals: normal  ST/T Wave abnormalities: normal  Conduction Disutrbances: none  Narrative Interpretation:   Old EKG Reviewed: No significant changes noted   Radiology DG Chest 2 View  Result Date: 05/11/2020 CLINICAL DATA:  Chronic bronchitis and COPD. EXAM: CHEST - 2 VIEW COMPARISON:  12/08/2016 FINDINGS: The heart size and mediastinal contours are within normal limits. Both lungs are clear. No pleural effusion or edema. Mild spondylosis within the thoracic spine. IMPRESSION: No active cardiopulmonary disease. Electronically Signed   By: Kerby Moors M.D.   On: 05/11/2020 10:30    Procedures Procedures (including critical care time)  Medications Ordered in  ED Medications  albuterol (VENTOLIN HFA) 108 (90 Base) MCG/ACT inhaler 4 puff (4 puffs Inhalation Given 05/11/20 1150)  ipratropium (ATROVENT HFA) inhaler 2 puff (2 puffs Inhalation Given 05/11/20 1150)  predniSONE (DELTASONE) tablet 60 mg (60 mg Oral Given 05/11/20 1151)    ED Course  I have reviewed the triage vital signs and the nursing notes.  Pertinent labs & imaging results that were available during my care of the patient were reviewed by me and considered in my medical decision making (see chart for details).    MDM Rules/Calculators/A&P                         BP (!) 161/76   Pulse 87   Temp (!) 97.5 F (36.4 C)   Resp (!) 23   Ht 5' 8"  (1.727 m)   Wt (!) 161.5 kg   SpO2 98%   BMI 54.13 kg/m   Final Clinical Impression(s) / ED Diagnoses Final diagnoses:  COPD exacerbation ([place])    Rx / DC Orders ED Discharge Orders         Ordered    predniSONE (DELTASONE) 20 MG tablet  Daily        05/11/20 1403         11:14 AM Here with shortness of breath and wheezing suggestive of COPD exacerbation likely triggered by increased tobacco use.  She will benefit from breathing treatment here which includes albuterol, Atrovent, prednisone.  COVID-19 test ordered.  Fortunately no hypoxia.  2:06 PM after receiving albuterol, Atrovent, and prednisone along with close monitoring, patient report moderate improvement of her symptoms.  She is now able to speak in complete sentences.  She is sitting comfortably, talking on the phone during my reevaluation. I encourage patient to avoid tobacco use, will provide symptomatic  treatment to go home for COPD exacerbation. Return precaution discussed. COVID test negative.   Nicole [name] was evaluated in Emergency Department on 05/11/2020 for the symptoms described in the history of present illness. She was evaluated in the context of the global COVID-19 pandemic, which necessitated consideration that the patient might be at risk for  infection with the SARS-CoV-2 virus that causes COVID-19. Institutional protocols and algorithms that pertain to the evaluation of patients at risk for COVID-19 are in a state of rapid change based on information released by regulatory bodies including the CDC and federal and state organizations. These policies and algorithms were followed during the patient's care in the ED.    Domenic Moras, PA-C 05/11/20 1410    Truddie Hidden, MD 05/12/20 [phone]

## 2020-05-11 NOTE — ED Triage Notes (Signed)
Reports hx of chronic bronchitis and copd, started becoming shob last night, denies cough, chills, fever. Using inhaler without relief.

## 2020-05-11 NOTE — Discharge Instructions (Addendum)
Please take prednisone as prescribed. Use albuterol and Atrovent 2 puffs every 4 hours as needed for your shortness of breath. Avoid tobacco use as it will likely aggravate your symptoms. Return if you have any concern.

## 2020-05-12 ENCOUNTER — Telehealth: Payer: Self-pay

## 2020-05-12 DIAGNOSIS — J209 Acute bronchitis, unspecified: Secondary | ICD-10-CM

## 2020-05-12 NOTE — Telephone Encounter (Signed)
Nicole [name] can you please assist with this? I am not sure who made this Transitional Care Call but the whole idea is to ensure the patient has an appointment within 2 weeks. Thank you!

## 2020-05-12 NOTE — Telephone Encounter (Signed)
Transition Care Management Follow-up Telephone Call  Date of discharge and from where: 05/11/2020 from [facility]  How have you been since you were released from the hospital? Patient   Any questions or concerns? No  Items Reviewed:  Did the pt receive and understand the discharge instructions provided? Yes   Medications obtained and verified? Yes   Any new allergies since your discharge? Yes   Dietary orders reviewed? Yes  Do you have support at home? Yes   Functional Questionnaire: (I = Independent and D = Dependent) ADLs: I  Bathing/Dressing- I  Meal Prep- I  Eating- I  Maintaining continence- I  Transferring/Ambulation- I  Managing Meds- I  Follow up appointments reviewed:   PCP - Patient stated that she does not have an appointment with PCP yet. Pt requested that I send a message to the office to get scheduled.   [facility] confirmed? No    Are transportation arrangements needed? No   If their condition worsens, is the pt aware to call PCP or go to the Emergency Dept.? Yes  Was the patient provided with contact information for the PCP's office or ED? Yes  Was to pt encouraged to call back with questions or concerns? Yes

## 2020-05-13 NOTE — Telephone Encounter (Signed)
Made pt aware

## 2020-05-20 ENCOUNTER — Ambulatory Visit: Payer: Medicaid Other

## 2020-05-24 ENCOUNTER — Other Ambulatory Visit: Payer: Self-pay | Admitting: *Deleted

## 2020-05-24 ENCOUNTER — Other Ambulatory Visit: Payer: Self-pay

## 2020-05-24 NOTE — Patient Instructions (Signed)
Visit Information  Ms. Herard was given information about Medicaid Managed Care team care coordination services as a part of their [facility] Medicaid benefit. SHALAH ESTELLE verbally consented to engagement with the [facility] Managed Care team.   For questions related to your [facility], please call: [phone] or visit the homepage here: [email or web address]  If you would like to schedule transportation through your [facility], please call the following number at least 2 days in advance of your appointment: [phone]  Goals Addressed              This Visit's Progress   .  "I would like my blood sugar levels to be improved" (pt-stated)        CARE PLAN ENTRY Medicaid Managed Care (see longtitudinal plan of care for additional care plan information)  Objective:  Lab Results  Component Value Date   HGBA1C 7.9 (A) 12/14/2019 .   Lab Results  Component Value Date   CREATININE 0.90 06/15/2019   CREATININE 1.15 (H) 10/09/2017   CREATININE 0.89 12/08/2016   . Patient reported cbg findings:last checked on 05/23/20 preprandial 140  Current Barriers:  Marland Kitchen Knowledge Deficits related to basic Diabetes pathophysiology and self care/management. Patient reports not taking diabetes medication. She is checking her blood sugar some of the time and has changed her diet.  Case Manager Clinical Goal(s):  Marland Kitchen Over the next 30 days, patient will demonstrate improved adherence to prescribed treatment plan for diabetes self care/management as evidenced by:  . daily monitoring and recording of CBG . adherence to ADA/ carb modified diet . exercise 3-5 days/week . adherence to prescribed medication regimen  Interventions:  . Provided education to patient about basic DM disease process . Reviewed medications with patient and discussed importance of medication adherence. Discussed  benefits of a medication organizer . Discussed plans with patient for ongoing care management follow up and provided patient with direct contact information for care management team . Reviewed scheduled/upcoming provider appointments including: 05/25/20 @ 10am . Provided Emmi video on diabetes and diabetic diet  Patient Self Care Activities:  . Self administers oral medications as prescribed . Attends all scheduled provider appointments . Checks blood sugars as prescribed and utilize hyper and hypoglycemia protocol as needed . Adheres to prescribed ADA/carb modified. Patient will watch educational video on diabetic diet  Initial goal documentation     .  "I would like to cut back on smoking" (pt-stated)        CARE PLAN ENTRY Medicaid Managed Care (see longitudinal plan of care for additional care plan information)  Current Barriers:  Marland Kitchen Knowledge Deficits related to smoking cessation. Patient had recent emergency room visit for breathing difficulty. She has COPD and would like to cut back on smoking.  Nurse Case Manager Clinical Goal(s):  Marland Kitchen Over the next 30 days, patient will work with PCP to address needs related to smoking cessation . Over the next 30 days, patient will attend all scheduled medical appointments: 05/25/20 @ 10 am with PCP and @ 2:45 with BSW . Over the next 60 days, patient will demonstrate improved health management independence as evidenced by decreasing number of cigarettes smoked daily, walking 3-5 times a week, eating a heart healthy diabetic diet  Interventions:  . Inter-disciplinary care team collaboration (see longitudinal plan of care) . Evaluation of current treatment plan related to COPD and patient's adherence to plan as established by provider. . Provided education to patient re: heart  healthy diet and smoking cessation . Reviewed medications with patient and discussed taking all medications as directed by the provider . Discussed plans with patient for  ongoing care management follow up and provided patient with direct contact information for care management team  Plan:  . Patient will attend scheduled appointment on 05/25/20 @ 10am with PCP and 2:45pm with BSW . RNCM will follow up with a telephone visit on 06/27/20 @ 10:30am   Initial goal documentation        Please see education materials related to heart healthy diet and quit smoking provided by MyChart link.    Heart-Healthy Eating Plan Heart-healthy meal planning includes:  Eating less unhealthy fats.  Eating more healthy fats.  Making other changes in your diet.  Cooking Avoid frying your food. Try to bake, boil, grill, or broil it instead. You can also reduce fat by:  Removing the skin from poultry.  Removing all visible fats from meats.  Steaming vegetables in water or broth.  Meal planning  At meals, divide your plate into four equal parts: ? Fill one-half of your plate with vegetables and green salads. ? Fill one-fourth of your plate with whole grains. ? Fill one-fourth of your plate with lean protein foods.  Eat 4-5 servings of vegetables per day. A serving of vegetables is: ? 1 cup of raw or cooked vegetables. ? 2 cups of raw leafy greens.  Eat 4-5 servings of fruit per day. A serving of fruit is: ? 1 medium whole fruit. ?  cup of dried fruit. ?  cup of fresh, frozen, or canned fruit. ?  cup of 100% fruit juice.  Eat more foods that have soluble fiber. These are apples, broccoli, carrots, beans, peas, and barley. Try to get 20-30 g of fiber per day.  Eat 4-5 servings of nuts, legumes, and seeds per week: ? 1 serving of dried beans or legumes equals  cup after being cooked. ? 1 serving of nuts is  cup. ? 1 serving of seeds equals 1 tablespoon.  General information  Eat more home-cooked food. Eat less restaurant, buffet, and fast food.  Limit or avoid alcohol.  Limit foods that are high in starch and sugar.  Avoid fried  foods.  Lose weight if you are overweight.  Keep track of how much salt (sodium) you eat. This is important if you have high blood pressure. Ask your doctor to tell you more about this.  Try to add vegetarian meals each week.  Fats  Choose healthy fats. These include olive oil and canola oil, flaxseeds, walnuts, almonds, and seeds.  Eat more omega-3 fats. These include salmon, mackerel, sardines, tuna, flaxseed oil, and ground flaxseeds. Try to eat fish at least 2 times each week.  Check food labels. Avoid foods with trans fats or high amounts of saturated fat.  Limit saturated fats. ? These are often found in animal products, such as meats, butter, and cream. ? These are also found in plant foods, such as palm oil, palm kernel oil, and coconut oil.  Avoid foods with partially hydrogenated oils in them. These have trans fats. Examples are stick margarine, some tub margarines, cookies, crackers, and other baked goods. What foods can I eat? Fruits All fresh, canned (in natural juice), or frozen fruits. Vegetables Fresh or frozen vegetables (raw, steamed, roasted, or grilled). Green salads. Grains Most grains. Choose whole wheat and whole grains most of the time. Rice and pasta, including brown rice and pastas made with whole wheat.  Meats and other proteins Lean, well-trimmed beef, veal, pork, and lamb. Chicken and [place] without skin. All fish and shellfish. Wild duck, rabbit, pheasant, and venison. Egg whites or low-cholesterol egg substitutes. Dried beans, peas, lentils, and tofu. Seeds and most nuts. Dairy Low-fat or nonfat cheeses, including ricotta and mozzarella. Skim or 1% milk that is liquid, powdered, or evaporated. Buttermilk that is made with low-fat milk. Nonfat or low-fat yogurt. Fats and oils Non-hydrogenated (trans-free) margarines. Vegetable oils, including soybean, sesame, sunflower, olive, peanut, safflower, corn, canola, and cottonseed. Salad dressings or mayonnaise  made with a vegetable oil. Beverages Mineral water. Coffee and tea. Diet carbonated beverages. Sweets and desserts Sherbet, gelatin, and fruit ice. Small amounts of dark chocolate. Limit all sweets and desserts. Seasonings and condiments All seasonings and condiments. The items listed above may not be a complete list of foods and drinks you can eat. Contact a dietitian for more options. What foods should I avoid? Fruits Canned fruit in heavy syrup. Fruit in cream or butter sauce. Fried fruit. Limit coconut. Vegetables Vegetables cooked in cheese, cream, or butter sauce. Fried vegetables. Grains Breads that are made with saturated or trans fats, oils, or whole milk. Croissants. Sweet rolls. Donuts. High-fat crackers, such as cheese crackers. Meats and other proteins Fatty meats, such as hot dogs, ribs, sausage, bacon, rib-eye roast or steak. High-fat deli meats, such as salami and bologna. Caviar. Domestic duck and goose. Organ meats, such as liver. Dairy Cream, sour cream, cream cheese, and creamed cottage cheese. Whole-milk cheeses. Whole or 2% milk that is liquid, evaporated, or condensed. Whole buttermilk. Cream sauce or high-fat cheese sauce. Yogurt that is made from whole milk. Fats and oils Meat fat, or shortening. Cocoa butter, hydrogenated oils, palm oil, coconut oil, palm kernel oil. Solid fats and shortenings, including bacon fat, salt pork, lard, and butter. Nondairy cream substitutes. Salad dressings with cheese or sour cream. Beverages Regular sodas and juice drinks with added sugar. Sweets and desserts Frosting. Pudding. Cookies. Cakes. Pies. Milk chocolate or white chocolate. Buttered syrups. Full-fat ice cream or ice cream drinks. The items listed above may not be a complete list of foods and drinks to avoid. Contact a dietitian for more information. Summary  Heart-healthy meal planning includes eating less unhealthy fats, eating more healthy fats, and making other  changes in your diet.  Eat a balanced diet. This includes fruits and vegetables, low-fat or nonfat dairy, lean protein, nuts and legumes, whole grains, and heart-healthy oils and fats. This information is not intended to replace advice given to you by your health care provider. Make sure you discuss any questions you have with your health care provider. Document Revised: 09/19/2017 Document Reviewed: 08/23/2017 Elsevier Patient Education  2020 [employer].    Steps to Quit Smoking Smoking tobacco is the leading cause of preventable death. It can affect almost every organ in the body. Smoking puts you and those around you at risk for developing many serious chronic diseases. Quitting smoking can be difficult, but it is one of the best things that you can do for your health. It is never too late to quit. How do I get ready to quit? When you decide to quit smoking, create a plan to help you succeed. Before you quit:  Pick a date to quit. Set a date within the next 2 weeks to give you time to prepare.  Write down the reasons why you are quitting. Keep this list in places where you will see it often.  Tell your family, friends, and co-workers that you are quitting. Support from your loved ones can make quitting easier.  Talk with your health care provider about your options for quitting smoking.  Find out what treatment options are covered by your health insurance.  Identify people, places, things, and activities that make you want to smoke (triggers). Avoid them. What first steps can I take to quit smoking?  Throw away all cigarettes at home, at work, and in your car.  Throw away smoking accessories, such as Set designerashtrays and lighters.  Clean your car. Make sure to empty the ashtray.  Clean your home, including curtains and carpets. What strategies can I use to quit smoking? Talk with your health care provider about combining strategies, such as taking medicines while you are also  receiving in-person counseling. Using these two strategies together makes you more likely to succeed in quitting than if you used either strategy on its own.  If you are pregnant or breastfeeding, talk with your health care provider about finding counseling or other support strategies to quit smoking. Do not take medicine to help you quit smoking unless your health care provider tells you to do so. To quit smoking: Quit right away  Quit smoking completely, instead of gradually reducing how much you smoke over a period of time. Research shows that stopping smoking right away is more successful than gradually quitting.  Attend in-person counseling to help you build problem-solving skills. You are more likely to succeed in quitting if you attend counseling sessions regularly. Even short sessions of 10 minutes can be effective. Take medicine You may take medicines to help you quit smoking. Some medicines require a prescription and some you can purchase over-the-counter. Medicines may have nicotine in them to replace the nicotine in cigarettes. Medicines may:  Help to stop cravings.  Help to relieve withdrawal symptoms. Your health care provider may recommend:  Nicotine patches, gum, or lozenges.  Nicotine inhalers or sprays.  Non-nicotine medicine that is taken by mouth. Find resources Find resources and support systems that can help you to quit smoking and remain smoke-free after you quit. These resources are most helpful when you use them often. They include:  Online chats with a Veterinary surgeoncounselor.  Telephone quitlines.  Printed Materials engineerself-help materials.  Support groups or group counseling.  Text messaging programs.  Mobile phone apps or applications. Use apps that can help you stick to your quit plan by providing reminders, tips, and encouragement. There are many free apps for mobile devices as well as websites. Examples include Quit Guide from the [employer] and smokefree.gov What things can I do to  make it easier to quit?   Reach out to your family and friends for support and encouragement. Call telephone quitlines (1-800-QUIT-NOW), reach out to support groups, or work with a counselor for support.  Ask people who smoke to avoid smoking around you.  Avoid places that trigger you to smoke, such as bars, parties, or smoke-break areas at work.  Spend time with people who do not smoke.  Lessen the stress in your life. Stress can be a smoking trigger for some people. To lessen stress, try: ? Exercising regularly. ? Doing deep-breathing exercises. ? Doing yoga. ? Meditating. ? Performing a body scan. This involves closing your eyes, scanning your body from head to toe, and noticing which parts of your body are particularly tense. Try to relax the muscles in those areas. How will I feel when I quit smoking? Day 1 to 3 weeks  Within the first 24 hours of quitting smoking, you may start to feel withdrawal symptoms. These symptoms are usually most noticeable 2-3 days after quitting, but they usually do not last for more than 2-3 weeks. You may experience these symptoms:  Mood swings.  Restlessness, anxiety, or irritability.  Trouble concentrating.  Dizziness.  Strong cravings for sugary foods and nicotine.  Mild weight gain.  Constipation.  Nausea.  Coughing or a sore throat.  Changes in how the medicines that you take for unrelated issues work in your body.  Depression.  Trouble sleeping (insomnia). Week 3 and afterward After the first 2-3 weeks of quitting, you may start to notice more positive results, such as:  Improved sense of smell and taste.  Decreased coughing and sore throat.  Slower heart rate.  Lower blood pressure.  Clearer skin.  The ability to breathe more easily.  Fewer sick days. Quitting smoking can be very challenging. Do not get discouraged if you are not successful the first time. Some people need to make many attempts to quit before they  achieve long-term success. Do your best to stick to your quit plan, and talk with your health care provider if you have any questions or concerns. Summary  Smoking tobacco is the leading cause of preventable death. Quitting smoking is one of the best things that you can do for your health.  When you decide to quit smoking, create a plan to help you succeed.  Quit smoking right away, not slowly over a period of time.  When you start quitting, seek help from your health care provider, family, or friends. This information is not intended to replace advice given to you by your health care provider. Make sure you discuss any questions you have with your health care provider. Document Revised: 04/10/2019 Document Reviewed: 10/04/2018 Elsevier Patient Education  2020 [employer].   Patient verbalizes understanding of instructions provided today.   The patient has been provided with contact information for the Managed Medicaid care management team and has been advised to call with any health related questions or concerns.  Telephone follow up appointment with Managed Medicaid care management team member scheduled for:06/27/20 @ 10:30  Estanislado Emms RN, BSN [place]  [employer]

## 2020-05-24 NOTE — Patient Outreach (Signed)
Care Coordination - Case Manager  05/24/2020  [name] 1975-08-19 [id number]  Subjective:  Nicole [name] is an 44 y.o. year old female who is a primary patient of Nicole Rakes, MD.  Ms. Wadleigh was given information about Medicaid Managed Care team care coordination services today. Nicole [name] agreed to services and verbal consent obtained  Review of patient status, laboratory and other test data was performed as part of evaluation for provision of services.  SDOH: SDOH Screenings   Alcohol Screen:   . Last Alcohol Screening Score (AUDIT): Not on file  Depression (PHQ2-9): Low Risk   . PHQ-2 Score: 4  Financial Resource Strain:   . Difficulty of Paying Living Expenses: Not on file  Food Insecurity: No Food Insecurity  . Worried About Charity fundraiser in the Last Year: Never true  . Ran Out of Food in the Last Year: Never true  Stress:   . Feeling of Stress : Not on file  Tobacco Use: High Risk  . Smoking Tobacco Use: Current Every Day Smoker  . Smokeless Tobacco Use: Never Used  Transportation Needs: No Transportation Needs  . Lack of Transportation (Medical): No  . Lack of Transportation (Non-Medical): No   SDOH Interventions     Most Recent Value  SDOH Interventions  Food Insecurity Interventions Intervention Not Indicated  Transportation Interventions Intervention Not Indicated      Objective:    No Known Allergies  Medications:    Medications Reviewed Today    Reviewed by Melissa Montane, RN (Registered Nurse) on 05/24/20 at 20  Med List Status: <None>  Medication Order Taking? Sig Documenting Provider Last Dose Status Informant  albuterol (VENTOLIN HFA) 108 (90 Base) MCG/ACT inhaler [id number] Yes Inhale 2 puffs into the lungs every 6 (six) hours as needed for wheezing or shortness of breath. Nicole Rakes, MD Taking Active   Aspirin-Salicylamide-Caffeine [facility] HEADACHE POWDER PO) [id number] Yes Take 1 packet by mouth 2 (two) times daily  as needed (headaches). [provider] Taking Active Self  atorvastatin (LIPITOR) 20 MG tablet [id number] No Take 1 tablet (20 mg total) by mouth daily.  Patient not taking: Reported on 05/24/2020   Nicole Rakes, MD Not Taking Active   Blood Glucose Monitoring Suppl (ACCU-CHEK AVIVA) device [id number] Yes Use as instructed daily. Nicole Rakes, MD Taking Active   Exenatide ER (BYDUREON) 2 MG PEN [id number] No Inject 2 mg into the skin once a week.  Patient not taking: Reported on 05/24/2020   Nicole Rakes, MD Not Taking Active   glipiZIDE (GLUCOTROL) 5 MG tablet [id number] No Take 1 tablet (5 mg total) by mouth 2 (two) times daily before a meal.  Patient not taking: Reported on 05/24/2020   Nicole Rakes, MD Not Taking Active   glucose blood (ACCU-CHEK AVIVA) test strip [id number] Yes Use daily Nicole Rakes, MD Taking Active   hydrochlorothiazide (HYDRODIURIL) 25 MG tablet [id number] No Take 1 tablet (25 mg total) by mouth daily.  Patient not taking: Reported on 05/24/2020   Nicole Rakes, MD Not Taking Active   hydrOXYzine (ATARAX/VISTARIL) 10 MG tablet [id number] No Take 1 tablet (10 mg total) by mouth 2 (two) times daily as needed.  Patient not taking: Reported on 09/22/2019   Nicole Rakes, MD Not Taking Active   Lancets Misc. (ACCU-CHEK SOFTCLIX LANCET DEV) KIT [id number] Yes 1 each by Does not apply route daily. Nicole Rakes, MD Taking Active   meloxicam (MOBIC) 7.5 MG tablet [id number] No Take 1 tablet (  7.5 mg total) by mouth daily.  Patient not taking: Reported on 05/24/2020   Nicole Rakes, MD Not Taking Active   methylPREDNISolone (MEDROL DOSEPAK) 4 MG TBPK tablet [id number] No 6 day dose pack - take as directed  Patient not taking: Reported on 05/24/2020   Nicole Rakes, MD Not Taking Active            Med Note (Michaeline Eckersley A   Tue May 24, 2020  1:09 PM) Finished dose pak  metroNIDAZOLE (FLAGYL) 500 MG tablet [id number] No Take 1 tablet (500 mg total) by  mouth 2 (two) times daily.  Patient not taking: Reported on 12/14/2019   Nicole Rakes, MD Not Taking Active   metroNIDAZOLE (METROGEL VAGINAL) 0.75 % vaginal gel [id number] No Place 1 Applicatorful vaginally at bedtime.  Patient not taking: Reported on 05/24/2020   Nicole Rakes, MD Not Taking Active   norgestimate-ethinyl estradiol (SPRINTEC 28) 0.25-35 MG-MCG tablet [id number] No Take 1 tablet by mouth daily.  Patient not taking: Reported on 09/22/2019   Nicole Rakes, MD Not Taking Active   predniSONE (DELTASONE) 20 MG tablet [id number] No Take 3 tablets (60 mg total) by mouth daily.  Patient not taking: Reported on 05/24/2020   Domenic Moras, PA-C Not Taking Active   tiotropium [facility] HANDIHALER) 18 MCG inhalation capsule [id number] Yes Place 1 capsule (18 mcg total) into inhaler and inhale daily. Nicole Rakes, MD Taking Active   Vitamin D, Ergocalciferol, (DRISDOL) 1.25 MG (50000 UNIT) CAPS capsule [id number] Yes Take 1 capsule (50,000 Units total) by mouth every 7 (seven) days. Nicole Rakes, MD Taking Active           Assessment:   Goals Addressed              This Visit's Progress   .  "I would like my blood sugar levels to be improved" (pt-stated)        CARE PLAN ENTRY Medicaid Managed Care (see longtitudinal plan of care for additional care plan information)  Objective:  Lab Results  Component Value Date   HGBA1C 7.9 (A) 12/14/2019 .   Lab Results  Component Value Date   CREATININE 0.90 06/15/2019   CREATININE 1.15 (H) 10/09/2017   CREATININE 0.89 12/08/2016   . Patient reported cbg findings:last checked on 05/23/20 preprandial 140  Current Barriers:  Marland Kitchen Knowledge Deficits related to basic Diabetes pathophysiology and self care/management. Patient reports not taking diabetes medication. She is checking her blood sugar some of the time and has changed her diet.  Case Manager Clinical Goal(s):  Marland Kitchen Over the next 30 days, patient will demonstrate improved  adherence to prescribed treatment plan for diabetes self care/management as evidenced by:  . daily monitoring and recording of CBG . adherence to ADA/ carb modified diet . exercise 3-5 days/week . adherence to prescribed medication regimen  Interventions:  . Provided education to patient about basic DM disease process . Reviewed medications with patient and discussed importance of medication adherence. Discussed benefits of a medication organizer . Discussed plans with patient for ongoing care management follow up and provided patient with direct contact information for care management team . Reviewed scheduled/upcoming provider appointments including: 05/25/20 @ 10am . Provided Emmi video on diabetes and diabetic diet  Patient Self Care Activities:  . Self administers oral medications as prescribed . Attends all scheduled provider appointments . Checks blood sugars as prescribed and utilize hyper and hypoglycemia protocol as needed . Adheres to prescribed ADA/carb modified. Patient will watch educational video on  diabetic diet  Initial goal documentation     .  "I would like to cut back on smoking" (pt-stated)        [place] (see longitudinal plan of care for additional care plan information)  Current Barriers:  Marland Kitchen Knowledge Deficits related to smoking cessation. Patient had recent emergency room visit for breathing difficulty. She has COPD and would like to cut back on smoking.  Nurse Case Manager Clinical Goal(s):  Marland Kitchen Over the next 30 days, patient will work with PCP to address needs related to smoking cessation . Over the next 30 days, patient will attend all scheduled medical appointments: 05/25/20 @ 10 am with PCP and @ 2:45 with BSW . Over the next 60 days, patient will demonstrate improved health management independence as evidenced by decreasing number of cigarettes smoked daily, walking 3-5 times a week, eating a heart healthy diabetic  diet  Interventions:  . Inter-disciplinary care team collaboration (see longitudinal plan of care) . Evaluation of current treatment plan related to COPD and patient's adherence to plan as established by provider. . Provided education to patient re: heart healthy diet and smoking cessation . Reviewed medications with patient and discussed taking all medications as directed by the provider . Discussed plans with patient for ongoing care management follow up and provided patient with direct contact information for care management team  Plan:  . Patient will attend scheduled appointment on 05/25/20 @ 10am with PCP and 2:45pm with BSW . RNCM will follow up with a telephone visit on 06/27/20 @ 10:30am   Initial goal documentation        Plan: RNCM will follow up with a telephone visit on 06/27/20 @ 10:30am  [name] RN, [place] RN Care Coordinator

## 2020-05-25 ENCOUNTER — Ambulatory Visit: Payer: Medicaid Other | Attending: Family Medicine | Admitting: Internal Medicine

## 2020-05-25 ENCOUNTER — Encounter: Payer: Self-pay | Admitting: Internal Medicine

## 2020-05-25 ENCOUNTER — Other Ambulatory Visit: Payer: Self-pay

## 2020-05-25 ENCOUNTER — Ambulatory Visit: Payer: Medicaid Other | Admitting: Internal Medicine

## 2020-05-25 VITALS — BP 120/85 | HR 74 | Resp 16 | Wt 356.8 lb

## 2020-05-25 DIAGNOSIS — E1169 Type 2 diabetes mellitus with other specified complication: Secondary | ICD-10-CM

## 2020-05-25 LAB — POCT GLYCOSYLATED HEMOGLOBIN (HGB A1C): HbA1c, POC (controlled diabetic range): 7.9 % — AB (ref 0.0–7.0)

## 2020-05-25 LAB — GLUCOSE, POCT (MANUAL RESULT ENTRY): POC Glucose: 203 mg/dl — AB (ref 70–99)

## 2020-05-25 MED ORDER — GLIMEPIRIDE 2 MG PO TABS: 2.0000 mg | ORAL_TABLET | Freq: Every day | ORAL | 3 refills | Status: DC

## 2020-05-25 NOTE — Patient Instructions (Signed)
Visit Information  Ms. Imhoff was given information about Medicaid Managed Care team care coordination services as a part of their [facility] Medicaid benefit. AIMAR SHREWSBURY verbally consented to engagement with the [facility] Managed Care team.   For questions related to your [facility], please call: [phone] or visit the homepage here: [email or web address]  If you would like to schedule transportation through your [facility], please call the following number at least 2 days in advance of your appointment: [phone]   Patient will contact resources provided: [employer] [phone], [employer], [phone], and [employer] [phone].Marland Kitchen Social Worker will follow up with patient in 30 days.Gus Puma, BSW, MHA [facility]  [place]  High Risk Managed Medicaid Team

## 2020-05-25 NOTE — Patient Outreach (Signed)
Care Coordination  05/25/2020  Nicole [name] [birth year] [id number]  Nicole [name] is a 43 y.o. year old female who sees Hoy Register, MD for primary care. The  Medicaid Managed Care team was consulted for assistance with Housing barriers. Ms. Westerhoff was given information about Care Management services, agreed to services, and verbal consent for services was obtained.  Interventions:   Patient interviewed and appropriate assessments performed  Collaborated with clinical team regarding patient needs   SDOH (Social Determinants of Health) assessments performed: Yes     Provided patient with information about resources that assist with rent. Patient stated she is not behind on rent, and has applied for section 8 and currently on the waitlist. Patient is not disabled and has not applied for disability. BSW provided [employer] at [phone], [employer] [phone], and [employer] [phone].  Plan:   Over the next 30 days, patient will work with BSW to address needs related to Housing barriers  Child psychotherapist will follow up with patient in 30 days.Gus Puma, BSW, MHA [facility]   [place]  High Risk Managed Medicaid Team

## 2020-05-25 NOTE — Progress Notes (Signed)
Was seen in ED 2 weeks ago with COPD exacerbation. Treated with inhalers and steroids. She was feeling better at the time she left the ED.

## 2020-05-25 NOTE — Assessment & Plan Note (Signed)
Lab Results  Component Value Date   HGBA1C 7.9 (A) 05/25/2020   She refuses metformin.  I'm not excited about sulfonylurea meds but that might be the best for her  She tells me that she is focused on weight loss. She admits to drinking a lot of sodas. She was counseled.

## 2020-06-27 ENCOUNTER — Other Ambulatory Visit: Payer: Self-pay

## 2020-06-27 ENCOUNTER — Other Ambulatory Visit: Payer: Self-pay | Admitting: *Deleted

## 2020-06-27 NOTE — Patient Outreach (Signed)
Care Coordination- Social Work  06/27/2020  Nakiesha S Steinhilber 06/15/1976 [id number]  Subjective:    Nicole [name] is an 44 y.o. year old female who is a primary patient of Newlin, Enobong, MD.    Ms. Klosinski was given information about Medicaid Managed Care team care coordination services today. Jyll S Rendleman agreed to services and verbal consent obtained  Review of patient status, laboratory and other test data was performed as part of evaluation for provision of services.  SDOH:   SDOH Screenings   Alcohol Screen:   . Last Alcohol Screening Score (AUDIT): Not on file  Depression (PHQ2-9): Medium Risk  . PHQ-2 Score: 7  Financial Resource Strain:   . Difficulty of Paying Living Expenses: Not on file  Food Insecurity: No Food Insecurity  . Worried About Running Out of Food in the Last Year: Never true  . Ran Out of Food in the Last Year: Never true  Housing:   . Last Housing Risk Score: Not on file  Physical Activity:   . Days of Exercise per Week: Not on file  . Minutes of Exercise per Session: Not on file  Social Connections:   . Frequency of Communication with Friends and Family: Not on file  . Frequency of Social Gatherings with Friends and Family: Not on file  . Attends Religious Services: Not on file  . Active Member of Clubs or Organizations: Not on file  . Attends Club or Organization Meetings: Not on file  . Marital Status: Not on file  Stress:   . Feeling of Stress : Not on file  Tobacco Use: High Risk  . Smoking Tobacco Use: Current Every Day Smoker  . Smokeless Tobacco Use: Never Used  Transportation Needs: No Transportation Needs  . Lack of Transportation (Medical): No  . Lack of Transportation (Non-Medical): No     Objective:    Medications:  Medications Reviewed Today    Reviewed by Robb, Melanie A, RN (Registered Nurse) on 06/27/20 at 1029  Med List Status: <None>  Medication Order Taking? Sig Documenting Provider Last Dose Status Informant   albuterol (VENTOLIN HFA) 108 (90 Base) MCG/ACT inhaler [id number] Yes Inhale 2 puffs into the lungs every 6 (six) hours as needed for wheezing or shortness of breath. Newlin, Enobong, MD Taking Active   Aspirin-Salicylamide-Caffeine ([facility] HEADACHE POWDER PO) [id number] No Take 1 packet by mouth 2 (two) times daily as needed (headaches).  Patient not taking: Reported on 06/27/2020   [provider] Not Taking Active Self  Blood Glucose Monitoring Suppl (ACCU-CHEK AVIVA) device [id number] No Use as instructed daily.  Patient not taking: Reported on 06/27/2020   Newlin, Enobong, MD Not Taking Active   glimepiride (AMARYL) 2 MG tablet [id number] Yes Take 1 tablet (2 mg total) by mouth daily before breakfast. Newlin, Enobong, MD Taking Active   glucose blood (ACCU-CHEK AVIVA) test strip [id number] No Use daily  Patient not taking: Reported on 06/27/2020   Newlin, Enobong, MD Not Taking Active   hydrOXYzine (ATARAX/VISTARIL) 10 MG tablet [id number]  Take 1 tablet (10 mg total) by mouth 2 (two) times daily as needed.  Patient not taking: Reported on 09/22/2019   Newlin, Enobong, MD  Active   Lancets Misc. (ACCU-CHEK SOFTCLIX LANCET DEV) KIT [id number] No 1 each by Does not apply route daily.  Patient not taking: Reported on 06/27/2020   Newlin, Enobong, MD Not Taking Active   meloxicam (MOBIC) 7.5 MG tablet [id number]  Take 1 tablet (7.5 mg total) by mouth   daily.  Patient not taking: Reported on 05/24/2020   [name], MD  Active   tiotropium [facility] HANDIHALER) 18 MCG inhalation capsule [id number] No Place 1 capsule (18 mcg total) into inhaler and inhale daily. [name], MD Unknown Active   Vitamin D, Ergocalciferol, (DRISDOL) 1.25 MG (50000 UNIT) CAPS capsule [id number] No Take 1 capsule (50,000 Units total) by mouth every 7 (seven) days. [name], MD Unknown Active           Fall/Depression Screening:  Fall Risk  05/25/2020 12/14/2019 09/22/2019  Falls in the past year? 1 0  0  Number falls in past yr: 0 - -  Injury with Fall? 1 - -   PHQ 2/9 Scores 05/25/2020 12/14/2019 09/22/2019 04/22/2019 03/22/2019 03/18/2019 01/15/2019  PHQ - 2 Score 2 1 0 0 0 0 0  PHQ- 9 Score _0 0 0 0 0    Assessment: Patient states she does not need the resources as of yet.  Plan: BSW will follow up in 45 days.

## 2020-06-27 NOTE — Patient Outreach (Signed)
Care Coordination - Case Manager  06/27/2020  Nicole [name] 01-31-1976 [id number]  Subjective:  Nicole [name] is an 44 y.o. year old female who is a primary patient of [name], MD.  [name] was given information about Medicaid Managed Care team care coordination services today. [name] agreed to services and verbal consent obtained  Today's call was unexpectedly disconnected after engaging with [name]. RNCM immediately tried to reconnect. This attempt was unsuccessful. RNCM will reach out again in the next 7 days.  Review of patient status, laboratory and other test data was performed as part of evaluation for provision of services.  SDOH: SDOH Screenings   Alcohol Screen:   . Last Alcohol Screening Score (AUDIT): Not on file  Depression (PHQ2-9): Medium Risk  . PHQ-2 Score: 7  Financial Resource Strain:   . Difficulty of Paying Living Expenses: Not on file  Food Insecurity: No Food Insecurity  . Worried About Charity fundraiser in the Last Year: Never true  . Ran Out of Food in the Last Year: Never true  Tobacco Use: High Risk  . Smoking Tobacco Use: Current Every Day Smoker  . Smokeless Tobacco Use: Never Used  Transportation Needs: No Transportation Needs  . Lack of Transportation (Medical): No  . Lack of Transportation (Non-Medical): No     Objective:    No Known Allergies  Medications:    Medications Reviewed Today    Reviewed by Melissa Montane, RN (Registered Nurse) on 06/27/20 at 1029  Med List Status: <None>  Medication Order Taking? Sig Documenting Provider Last Dose Status Informant  albuterol (VENTOLIN HFA) 108 (90 Base) MCG/ACT inhaler [id number] Yes Inhale 2 puffs into the lungs every 6 (six) hours as needed for wheezing or shortness of breath. [name], MD Taking Active   Aspirin-Salicylamide-Caffeine [facility] HEADACHE POWDER PO) [id number] No Take 1 packet by mouth 2 (two) times daily as needed (headaches).  Patient not  taking: Reported on 06/27/2020   [provider] Not Taking Active Self  Blood Glucose Monitoring Suppl (ACCU-CHEK AVIVA) device [id number] No Use as instructed daily.  Patient not taking: Reported on 06/27/2020   [name], MD Not Taking Active   glimepiride (AMARYL) 2 MG tablet [id number] Yes Take 1 tablet (2 mg total) by mouth daily before breakfast. [name], MD Taking Active   glucose blood (ACCU-CHEK AVIVA) test strip [id number] No Use daily  Patient not taking: Reported on 06/27/2020   [name], MD Not Taking Active   hydrOXYzine (ATARAX/VISTARIL) 10 MG tablet [id number]  Take 1 tablet (10 mg total) by mouth 2 (two) times daily as needed.  Patient not taking: Reported on 09/22/2019   [name], MD  Active   Lancets Misc. (ACCU-CHEK SOFTCLIX LANCET DEV) KIT [id number] No 1 each by Does not apply route daily.  Patient not taking: Reported on 06/27/2020   [name], MD Not Taking Active   meloxicam (MOBIC) 7.5 MG tablet [id number]  Take 1 tablet (7.5 mg total) by mouth daily.  Patient not taking: Reported on 05/24/2020   [name], MD  Active   tiotropium [facility] HANDIHALER) 18 MCG inhalation capsule [id number] No Place 1 capsule (18 mcg total) into inhaler and inhale daily. [name], MD Unknown Active   Vitamin D, Ergocalciferol, (DRISDOL) 1.25 MG (50000 UNIT) CAPS capsule [id number] No Take 1 capsule (50,000 Units total) by mouth every 7 (seven) days. [name], MD Unknown Active  Assessment:   Goals Addressed              This Visit's Progress   .  "I would like my blood sugar levels to be improved" (pt-stated)        CARE PLAN ENTRY Medicaid Managed Care (see longtitudinal plan of care for additional care plan information)  Objective:  Lab Results  Component Value Date   HGBA1C 7.9 (A) 12/14/2019 .   Lab Results  Component Value Date   CREATININE 0.90 06/15/2019   CREATININE 1.15 (H) 10/09/2017    CREATININE 0.89 12/08/2016   . Patient reported cbg findings: not checking at home  Current Barriers:  Marland Kitchen Knowledge Deficits related to basic Diabetes pathophysiology and self care/management. Patient reports taking diabetes medication. She is not checking her blood sugar at home. During medication review, call was disconnected and RNCM was unable to reconnect.  Case Manager Clinical Goal(s):  Marland Kitchen Over the next 30 days, patient will demonstrate improved adherence to prescribed treatment plan for diabetes self care/management as evidenced by:  . daily monitoring and recording of CBG . adherence to ADA/ carb modified diet . exercise 3-5 days/week . adherence to prescribed medication regimen  Interventions:  . Provided education to patient about basic DM disease process . Reviewed medications with patient and discussed importance of medication adherence. Discussed benefits of a medication organizer . Discussed plans with patient for ongoing care management follow up and provided patient with direct contact information for care management team . Provided Emmi video on diabetes and diabetic diet. Encouraged patient to watch this short video  Patient Self Care Activities:  . Self administers oral medications as prescribed . Attends all scheduled provider appointments . Checks blood sugars as prescribed and utilize hyper and hypoglycemia protocol as needed . Adheres to prescribed ADA/carb modified. Patient will watch educational video on diabetic diet  Please see past updates related to this goal by clicking on the "Past Updates" button in the selected goal        Plan: RNCM will follow up within the next 7 days.  [name] RN, BSN [place]  [employer] manager

## 2020-06-27 NOTE — Patient Instructions (Addendum)
Visit Information  Nicole [name] was given information about Medicaid Managed Care team care coordination services as a part of their [facility] Community Plan Medicaid benefit. Nicole [name] verbally consented to engagement with the [facility] Managed Care team.   Thank you for taking the time to speak with me today. Unfortunately our call was disconnected and I was unable to reach you again. I will reach out again in the next 7 days for follow up.  For questions related to your [facility], please call: [phone] or visit the homepage here: [email or web address]  If you would like to schedule transportation through your [facility], please call the following number at least 2 days in advance of your appointment: [phone]  Goals Addressed              This Visit's Progress   .  "I would like my blood sugar levels to be improved" (pt-stated)        CARE PLAN ENTRY Medicaid Managed Care (see longtitudinal plan of care for additional care plan information)  Objective:  Lab Results  Component Value Date   HGBA1C 7.9 (A) 12/14/2019 .   Lab Results  Component Value Date   CREATININE 0.90 06/15/2019   CREATININE 1.15 (H) 10/09/2017   CREATININE 0.89 12/08/2016   . Patient reported cbg findings: not checking at home  Current Barriers:  Marland Kitchen Knowledge Deficits related to basic Diabetes pathophysiology and self care/management. Patient reports taking diabetes medication. She is not checking her blood sugar at home. During medication review, call was disconnected and RNCM was unable to reconnect.  Case Manager Clinical Goal(s):  Marland Kitchen Over the next 30 days, patient will demonstrate improved adherence to prescribed treatment plan for diabetes self care/management as evidenced by:  . daily monitoring and recording of CBG . adherence to ADA/ carb modified diet . exercise 3-5  days/week . adherence to prescribed medication regimen  Interventions:  . Provided education to patient about basic DM disease process . Reviewed medications with patient and discussed importance of medication adherence. Discussed benefits of a medication organizer . Discussed plans with patient for ongoing care management follow up and provided patient with direct contact information for care management team . Provided Emmi video on diabetes and diabetic diet. Encouraged patient to watch this short video  Patient Self Care Activities:  . Self administers oral medications as prescribed . Attends all scheduled provider appointments . Checks blood sugars as prescribed and utilize hyper and hypoglycemia protocol as needed . Adheres to prescribed ADA/carb modified. Patient will watch educational video on diabetic diet  Please see past updates related to this goal by clicking on the "Past Updates" button in the selected goal         Please see education materials related to diabetes provided by MyChart link.  The patient verbalized understanding of instructions provided today and declined a print copy of patient instruction materials.   The Managed Medicaid care management team will reach out to the patient again over the next 7 days. I have scheduled a follow up appointment for 07/04/20 @ 2:30pm  Estanislado Emms RN, BSN [place]  [facility] RN Care Coordinator

## 2020-06-27 NOTE — Patient Instructions (Signed)
Visit Information  Nicole [name] was given information about Medicaid Managed Care team care coordination services as a part of their [facility] Community Plan Medicaid benefit. Nicole [name] verbally consented to engagement with the [facility] Managed Care team.   For questions related to your [facility], please call: [phone] or visit the homepage here: [email or web address]  If you would like to schedule transportation through your [facility], please call the following number at least 2 days in advance of your appointment: [phone]    Social Worker will follow up in 45 days.Gus Puma, BSW, MHA [facility]    High Risk Managed Medicaid Team

## 2020-07-01 ENCOUNTER — Ambulatory Visit: Payer: Self-pay

## 2020-07-01 NOTE — Telephone Encounter (Signed)
Please  see PEC triage note ! thank you

## 2020-07-01 NOTE — Telephone Encounter (Signed)
Patient called stating that she has had ongoing headache for 7-8 days.  She states that she has taken ibuprofen for the pain and it gose away but comes back as soon as time for more medication.  She states the pain can be anywhere from a 4-8.  She states it is sometimes front of her head and sometimes the back of her head. It can be behind her eyes.  She is concerned because she does have an aunt in hospital with a brain aneurism. She states that he vision will blur and become double but she has always had this problem. She denies cold symptoms.  she states that once she went to move her neck but it was stiff not today. Per protocol patient will go to UC for evaluation. Patient was encouraged to call for follow up with PCP.    Reason for Disposition . [1] MODERATE headache (e.g., interferes with normal activities) AND [2] present > 24 hours AND [3] unexplained  (Exceptions: analgesics not tried, typical migraine, or headache part of viral illness)  Answer Assessment - Initial Assessment Questions 1. LOCATION: "Where does it hurt?"      Changes from front to back all over behind eye both 2. ONSET: "When did the headache start?" (Minutes, hours or days)     7-8 days ago 3. PATTERN: "Does the pain come and go, or has it been constant since it started?"     With Ibuprofen then comes right back 4. SEVERITY: "How bad is the pain?" and "What does it keep you from doing?"  (e.g., Scale 1-10; mild, moderate, or severe)   - MILD (1-3): doesn't interfere with normal activities    - MODERATE (4-7): interferes with normal activities or awakens from sleep    - SEVERE (8-10): excruciating pain, unable to do any normal activities        4-8 5. RECURRENT SYMPTOM: "Have you ever had headaches before?" If Yes, ask: "When was the last time?" and "What happened that time?"      no 6. CAUSE: "What do you think is causing the headache?"     no 7. MIGRAINE: "Have you been diagnosed with migraine headaches?" If Yes,  ask: "Is this headache similar?"      no 8. HEAD INJURY: "Has there been any recent injury to the head?"     No 9. OTHER SYMPTOMS: "Do you have any other symptoms?" (fever, stiff neck, eye pain, sore throat, cold symptoms)     Stiff neck sometimes 10. PREGNANCY: "Is there any chance you are pregnant?" "When was your last menstrual period?"       No cycles have change 2 weeks ago  Protocols used: HEADACHE-A-AH

## 2020-07-04 ENCOUNTER — Other Ambulatory Visit: Payer: Self-pay

## 2020-07-04 ENCOUNTER — Other Ambulatory Visit: Payer: Self-pay | Admitting: *Deleted

## 2020-07-04 NOTE — Patient Outreach (Signed)
Care Coordination  07/04/2020  Nicole [name] 05-25-1976 [id number]   A successful outreach by the HR Managed Medicaid Care Team was made today. However, [name] requested to reschedule the appointment for next week. A new appointment was made for 07/13/20 @ 11:30am.  Estanislado Emms RN, BSN [place]  [facility] RN Care Coordinator

## 2020-07-13 ENCOUNTER — Other Ambulatory Visit: Payer: Self-pay | Admitting: *Deleted

## 2020-07-13 NOTE — Patient Instructions (Signed)
Visit Information  Ms. Marcheta Grammes  - as a part of your Medicaid benefit, you are eligible for care management and care coordination services at no cost or copay. I was unable to reach you by phone today but would be happy to help you with your health related needs. Please feel free to call me @ [phone].   A member of the Managed Medicaid care management team will reach out to you again over the next 7-14 days.   Estanislado Emms RN, BSN [place]  [facility]

## 2020-07-13 NOTE — Patient Outreach (Signed)
Care Coordination  07/13/2020  [name] [birth year] [id number]   Medicaid Managed Care   Unsuccessful Outreach Note  07/13/2020 Name: Nicole [name] MRN: [id number] DOB: 27-Apr-1976  Referred by: Hoy Register, MD Reason for referral : High Risk Managed Medicaid (Unsuccessful RNCM follow up outreach)   An unsuccessful telephone outreach was attempted today. The patient was referred to the case management team for assistance with care management and care coordination.   Follow Up Plan: RNCM will follow up with a telephone call over the next 7-14 days.  Estanislado Emms RN, BSN [place]  [facility]

## 2020-07-19 ENCOUNTER — Telehealth: Payer: Self-pay | Admitting: Family Medicine

## 2020-07-19 NOTE — Telephone Encounter (Signed)
I spoke with Nicole [name] today and she does not wish to reschedule her appt with the RNCM. Patient states she does not need our services right now.

## 2020-07-26 DIAGNOSIS — Z113 Encounter for screening for infections with a predominantly sexual mode of transmission: Secondary | ICD-10-CM | POA: Diagnosis not present

## 2020-08-17 ENCOUNTER — Other Ambulatory Visit: Payer: Self-pay

## 2020-08-17 NOTE — Patient Outreach (Signed)
Medicaid Managed Care Social Work Note  08/17/2020 Name:  Nicole [name] MRN:  [id number] DOB:  1976/05/08  Nicole [name] is an 45 y.o. year old female who is a primary patient of Nicole Rakes, MD.  The Medicaid Managed Care Coordination team was consulted for assistance with:  [place] and Resources  Nicole [name] was given information about Medicaid Managed CareCoordination services today. Nicole [name] agreed to services and verbal consent obtained.  Engaged with patient  for by telephone forfollow up visit in response to referral for case management and/or care coordination services.   Assessments/Interventions:  Review of past medical history, allergies, medications, health status, including review of consultants reports, laboratory and other test data, was performed as part of comprehensive evaluation and provision of chronic care management services.  SDOH: (Social Determinant of Health) assessments and interventions performed:  BSW followed up with patient about contacting resources provided in November 2021. Patient stated she has not contacted those resources nor has she attended any therapy.  Patient stated she does not need any resources at this time.   Advanced Directives Status:  Not addressed in this encounter.  Care Plan                 No Known Allergies  Medications Reviewed Today    Reviewed by Nicole Montane, RN (Registered Nurse) on 06/27/20 at 1029  Med List Status: <None>  Medication Order Taking? Sig Documenting Provider Last Dose Status Informant  albuterol (VENTOLIN HFA) 108 (90 Base) MCG/ACT inhaler [id number] Yes Inhale 2 puffs into the lungs every 6 (six) hours as needed for wheezing or shortness of breath. Nicole Rakes, MD Taking Active   Aspirin-Salicylamide-Caffeine [facility] HEADACHE POWDER PO) [id number] No Take 1 packet by mouth 2 (two) times daily as needed (headaches).  Patient not taking: Reported on 06/27/2020   [provider] Not Taking Active Self  Blood Glucose Monitoring Suppl (ACCU-CHEK AVIVA) device [id number] No Use as instructed daily.  Patient not taking: Reported on 06/27/2020   Nicole Rakes, MD Not Taking Active   glimepiride (AMARYL) 2 MG tablet [id number] Yes Take 1 tablet (2 mg total) by mouth daily before breakfast. Nicole Rakes, MD Taking Active   glucose blood (ACCU-CHEK AVIVA) test strip [id number] No Use daily  Patient not taking: Reported on 06/27/2020   Nicole Rakes, MD Not Taking Active   hydrOXYzine (ATARAX/VISTARIL) 10 MG tablet [id number]  Take 1 tablet (10 mg total) by mouth 2 (two) times daily as needed.  Patient not taking: Reported on 09/22/2019   Nicole Rakes, MD  Active   Lancets Misc. (ACCU-CHEK SOFTCLIX LANCET DEV) KIT [id number] No 1 each by Does not apply route daily.  Patient not taking: Reported on 06/27/2020   Nicole Rakes, MD Not Taking Active   meloxicam (MOBIC) 7.5 MG tablet [id number]  Take 1 tablet (7.5 mg total) by mouth daily.  Patient not taking: Reported on 05/24/2020   Nicole Rakes, MD  Active   tiotropium [facility] HANDIHALER) 18 MCG inhalation capsule [id number] No Place 1 capsule (18 mcg total) into inhaler and inhale daily. Nicole Rakes, MD Unknown Active   Vitamin D, Ergocalciferol, (DRISDOL) 1.25 MG (50000 UNIT) CAPS capsule [id number] No Take 1 capsule (50,000 Units total) by mouth every 7 (seven) days. Nicole Rakes, MD Unknown Active           Patient Active Problem List   Diagnosis Date Noted  . Anxiety and depression 06/11/2018  . Vitamin D deficiency 01/08/2018  .  Osteoarthritis 01/08/2018  . Type 2 diabetes mellitus ([place]) 01/08/2018  . Morbid obesity ([place]) 09/06/2016  . Acute appendicitis 03/25/2014  . Hypertension 03/25/2014    Conditions to be addressed/monitored per PCP order:  None  There are no care plans that you recently modified to display for this patient.   Follow up:  Patient agrees to Care Plan and  Follow-up.  Plan: The patient has been provided with contact information for the Managed Medicaid care management team and has been advised to call with any health related questions or concerns.   Nicole [name], BSW, [place]  High Risk Managed Medicaid Team

## 2020-08-17 NOTE — Patient Instructions (Signed)
Visit Information  Nicole [name] was given information about Medicaid Managed Care team care coordination services as a part of their [facility] Community Plan Medicaid benefit. Nicole [name] verbally consented to engagement with the [facility] Managed Care team.   For questions related to your [facility], please call: [phone] or visit the homepage here: [email or web address]  If you would like to schedule transportation through your [facility], please call the following number at least 2 days in advance of your appointment: [phone]  Nicole [name] - following are the goals we discussed in your visit today:  Goals Addressed   None       The patient has been provided with contact information for the Managed Medicaid care management team and has been advised to call with any health related questions or concerns.   Nicole [name]  Following is a copy of your plan of care:  There are no care plans to display for this patient.

## 2020-09-12 ENCOUNTER — Ambulatory Visit: Payer: Self-pay | Admitting: *Deleted

## 2020-09-12 NOTE — Telephone Encounter (Signed)
Patient is calling to report she has had blood in urine since last Thursday. Patient states she did have cramping the first day- but no pain since then. Patient states she normally has frequency/urgency- since last child was born- but reports no other symptoms- pain, fever. Call to office appointment schedule for evaluation of hematuria.  Reason for Disposition . Blood in urine  (Exception: could be normal menstrual bleeding)  Answer Assessment - Initial Assessment Questions 1. COLOR of URINE: "Describe the color of the urine."  (e.g., tea-colored, pink, red, blood clots, bloody)     Red- small clot 2. ONSET: "When did the bleeding start?"      Thursday 3. EPISODES: "How many times has there been blood in the urine?" or "How many times today?"     Every time with urination 4. PAIN with URINATION: "Is there any pain with passing your urine?" If Yes, ask: "How bad is the pain?"  (Scale 1-10; or mild, moderate, severe)    - MILD - complains slightly about urination hurting    - MODERATE - interferes with normal activities      - SEVERE - excruciating, unwilling or unable to urinate because of the pain      No pain- did have cramping- lasted 1 day 5. FEVER: "Do you have a fever?" If Yes, ask: "What is your temperature, how was it measured, and when did it start?"     no 6. ASSOCIATED SYMPTOMS: "Are you passing urine more frequently than usual?"     no 7. OTHER SYMPTOMS: "Do you have any other symptoms?" (e.g., back/flank pain, abdominal pain, vomiting)     no 8. PREGNANCY: "Is there any chance you are pregnant?" "When was your last menstrual period?"     Unsure- LMP-1/30- missed December cycle-January normal cycle  Protocols used: URINE - BLOOD IN-A-AH

## 2020-09-13 ENCOUNTER — Ambulatory Visit: Payer: Medicaid Other | Attending: Internal Medicine | Admitting: Internal Medicine

## 2020-09-13 ENCOUNTER — Encounter: Payer: Self-pay | Admitting: Internal Medicine

## 2020-09-13 ENCOUNTER — Other Ambulatory Visit: Payer: Self-pay

## 2020-09-13 VITALS — BP 137/87 | HR 74 | Resp 16 | Wt 353.0 lb

## 2020-09-13 DIAGNOSIS — R310 Gross hematuria: Secondary | ICD-10-CM | POA: Diagnosis not present

## 2020-09-13 LAB — POCT URINALYSIS DIP (CLINITEK)
Bilirubin, UA: NEGATIVE
Glucose, UA: NEGATIVE mg/dL
Ketones, POC UA: NEGATIVE mg/dL
Nitrite, UA: POSITIVE — AB
POC PROTEIN,UA: NEGATIVE
Spec Grav, UA: >=1.03 — AB (ref 1.010–1.025)
Urobilinogen, UA: 0.2 E.U./dL
pH, UA: 5.5 (ref 5.0–8.0)

## 2020-09-13 MED ORDER — SULFAMETHOXAZOLE-TRIMETHOPRIM 800-160 MG PO TABS: 1.0000 | ORAL_TABLET | Freq: Two times a day (BID) | ORAL | 0 refills | Status: DC

## 2020-09-13 NOTE — Progress Notes (Signed)
Pt states she notice blood in her urine last week on Thursday   Pt states she notices it when she wipes and its a pink color

## 2020-09-13 NOTE — Progress Notes (Signed)
Patient ID: Nicole [name], female    DOB: 21-Aug-1975  MRN: [id number]  CC: Hematuria   Subjective: Nicole [name] is a 45 y.o. female who presents for UC visit Her concerns today include:  Pt with hx of morbid obesity, Type 2 DM  osteoarthritis of the knee, tobacco abuse  Pt c/o of seeing blood in the urine that started 5 days ago.  Lasted 4 days.   No associated dysuria. Last menses was end of January and lasted 3 days.   Seen at HD last mth because of dysuria and frequency.  Told everything checked out find and did not have UTI.  No fever or back pain.  Patient Active Problem List   Diagnosis Date Noted  . Anxiety and depression 06/11/2018  . Vitamin D deficiency 01/08/2018  . Osteoarthritis 01/08/2018  . Type 2 diabetes mellitus ([place]) 01/08/2018  . Morbid obesity ([place]) 09/06/2016  . Acute appendicitis 03/25/2014  . Hypertension 03/25/2014     Current Outpatient Medications on File Prior to Visit  Medication Sig Dispense Refill  . albuterol (VENTOLIN HFA) 108 (90 Base) MCG/ACT inhaler Inhale 2 puffs into the lungs every 6 (six) hours as needed for wheezing or shortness of breath. 18 g 6  . Aspirin-Salicylamide-Caffeine (BC HEADACHE POWDER PO) Take 1 packet by mouth 2 (two) times daily as needed (headaches). (Patient not taking: Reported on 06/27/2020)    . Blood Glucose Monitoring Suppl (ACCU-CHEK AVIVA) device Use as instructed daily. (Patient not taking: Reported on 06/27/2020) 1 each 0  . glimepiride (AMARYL) 2 MG tablet Take 1 tablet (2 mg total) by mouth daily before breakfast. 30 tablet 3  . glucose blood (ACCU-CHEK AVIVA) test strip Use daily (Patient not taking: Reported on 06/27/2020) 30 each 12  . hydrOXYzine (ATARAX/VISTARIL) 10 MG tablet Take 1 tablet (10 mg total) by mouth 2 (two) times daily as needed. (Patient not taking: Reported on 09/22/2019) 60 tablet 6  . Lancets Misc. (ACCU-CHEK SOFTCLIX LANCET DEV) KIT 1 each by Does not apply route daily. (Patient not  taking: Reported on 06/27/2020) 1 kit 12  . meloxicam (MOBIC) 7.5 MG tablet Take 1 tablet (7.5 mg total) by mouth daily. (Patient not taking: Reported on 05/24/2020) 30 tablet 1  . tiotropium (SPIRIVA HANDIHALER) 18 MCG inhalation capsule Place 1 capsule (18 mcg total) into inhaler and inhale daily. 30 capsule 6  . Vitamin D, Ergocalciferol, (DRISDOL) 1.25 MG (50000 UNIT) CAPS capsule Take 1 capsule (50,000 Units total) by mouth every 7 (seven) days. 12 capsule 0   No current facility-administered medications on file prior to visit.    No Known Allergies  Social History   Socioeconomic History  . Marital status: Single    Spouse name: Not on file  . Number of children: Not on file  . Years of education: Not on file  . Highest education level: Not on file  Occupational History  . Not on file  Tobacco Use  . Smoking status: Current Every Day Smoker    Packs/day: 0.50    Years: 27.00    Pack years: 13.50    Types: Cigarettes  . Smokeless tobacco: Never Used  . Tobacco comment: 6 cigs daily  Vaping Use  . Vaping Use: Never used  Substance and Sexual Activity  . Alcohol use: Yes    Comment: occ  . Drug use: Yes    Frequency: 7.0 times per week    Types: Marijuana  . Sexual activity: Not Currently  Comment: 5 years  Other Topics Concern  . Not on file  Social History Narrative  . Not on file   Social Determinants of Health   Financial Resource Strain: Not on file  Food Insecurity: No Food Insecurity  . Worried About Charity fundraiser in the Last Year: Never true  . Ran Out of Food in the Last Year: Never true  Transportation Needs: No Transportation Needs  . Lack of Transportation (Medical): No  . Lack of Transportation (Non-Medical): No  Physical Activity: Not on file  Stress: Not on file  Social Connections: Not on file  Intimate Partner Violence: Not on file    Family History  Problem Relation Age of Onset  . Hypertension Mother   . Cancer Mother         pancreatic cancer  . Arthritis Father   . Heart disease Maternal Grandmother     Past Surgical History:  Procedure Laterality Date  . APPENDECTOMY    . CESAREAN SECTION    . HERNIA REPAIR    . LAPAROSCOPIC APPENDECTOMY N/A 03/25/2014   Procedure: APPENDECTOMY LAPAROSCOPIC;  Surgeon: Gwenyth Ober, MD;  Location: [facility] OR;  Service: General;  Laterality: N/A;    ROS: Review of Systems Negative except as stated above  PHYSICAL EXAM: BP 137/87   Pulse 74   Resp 16   Wt (!) 353 lb (160.1 kg)   SpO2 99%   BMI 53.67 kg/m   Physical Exam  General appearance - alert, well appearing, obese middle-aged African-American female and in no distress Mental status - normal mood, behavior, speech, dress, motor activity, and thought processes  Results for orders placed or performed in visit on 09/13/20  POCT URINALYSIS DIP (CLINITEK)  Result Value Ref Range   Color, UA yellow yellow   Clarity, UA clear clear   Glucose, UA negative negative mg/dL   Bilirubin, UA negative negative   Ketones, POC UA negative negative mg/dL   Spec Grav, UA >=1.030 (A) 1.010 - 1.025   Blood, UA moderate (A) negative   pH, UA 5.5 5.0 - 8.0   POC PROTEIN,UA negative negative, trace   Urobilinogen, UA 0.2 0.2 or 1.0 E.U./dL   Nitrite, UA Positive (A) Negative   Leukocytes, UA Small (1+) (A) Negative     CMP Latest Ref Rng & Units 06/15/2019 10/09/2017 12/08/2016  Glucose 65 - 99 mg/dL 174(H) 93 125(H)  BUN 6 - 24 mg/dL 11 14 10   Creatinine 0.57 - 1.00 mg/dL 0.90 1.15(H) 0.89  Sodium 134 - 144 mmol/L 140 140 135  Potassium 3.5 - 5.2 mmol/L 4.9 4.9 3.8  Chloride 96 - 106 mmol/L 102 104 106  CO2 20 - 29 mmol/L 24 23 24   Calcium 8.7 - 10.2 mg/dL 9.7 9.2 8.6(L)  Total Protein 6.0 - 8.5 g/dL 7.1 7.0 -  Total Bilirubin 0.0 - 1.2 mg/dL 0.2 0.2 -  Alkaline Phos 39 - 117 IU/L 127(H) 86 -  AST 0 - 40 IU/L 30 22 -  ALT 0 - 32 IU/L 34(H) 20 -   Lipid Panel     Component Value Date/Time   CHOL 172 12/15/2019  0933   TRIG 111 12/15/2019 0933   HDL 58 12/15/2019 0933   CHOLHDL 3.0 12/15/2019 0933   CHOLHDL 3.8 09/06/2016 1018   LDLCALC 94 12/15/2019 0933    CBC    Component Value Date/Time   WBC 11.3 (H) 10/09/2017 1458   WBC 9.7 12/08/2016 1453   RBC 4.96 10/09/2017  1458   RBC 4.76 12/08/2016 1453   HGB 12.9 10/09/2017 1458   HCT 40.4 10/09/2017 1458   PLT 260 10/09/2017 1458   MCV 82 10/09/2017 1458   MCH 26.0 (L) 10/09/2017 1458   MCH 26.3 12/08/2016 1453   MCHC 31.9 10/09/2017 1458   MCHC 32.0 12/08/2016 1453   RDW 14.4 10/09/2017 1458   LYMPHSABS 3.2 (H) 10/09/2017 1458   MONOABS 342 09/06/2016 1018   EOSABS 0.2 10/09/2017 1458   BASOSABS 0.0 10/09/2017 1458    ASSESSMENT AND PLAN:  1. Gross hematuria Likely due to UTI given findings on urine dip.  Recommend treatment with Bactrim for 5 days.  Return to the lab after she has completed antibiotics to make sure that blood in the urine resolves. - POCT URINALYSIS DIP (CLINITEK) - sulfamethoxazole-trimethoprim (BACTRIM DS) 800-160 MG tablet; Take 1 tablet by mouth 2 (two) times daily.  Dispense: 10 tablet; Refill: 0 - Urinalysis, Routine w reflex microscopic; Future    Patient was given the opportunity to ask questions.  Patient verbalized understanding of the plan and was able to repeat key elements of the plan.   Orders Placed This Encounter  Procedures  . POCT URINALYSIS DIP (CLINITEK)     Requested Prescriptions    No prescriptions requested or ordered in this encounter    No follow-ups on file.  Karle Plumber, MD, FACP

## 2020-09-13 NOTE — Patient Instructions (Signed)
You have a urinary tract infection.  I have placed you on an antibiotic called Bactrim which she will take twice a day for 5 days.  Return to the lab in 1 week to have a repeat urinalysis done.

## 2020-09-14 ENCOUNTER — Telehealth: Payer: Self-pay | Admitting: Family Medicine

## 2020-09-14 DIAGNOSIS — R310 Gross hematuria: Secondary | ICD-10-CM

## 2020-09-14 MED ORDER — SULFAMETHOXAZOLE-TRIMETHOPRIM 800-160 MG PO TABS: 1.0000 | ORAL_TABLET | Freq: Two times a day (BID) | ORAL | 0 refills | Status: DC

## 2020-09-14 NOTE — Addendum Note (Signed)
Addended by: Jonah Blue B on: 09/14/2020 12:04 PM   Modules accepted: Orders

## 2020-09-14 NOTE — Telephone Encounter (Signed)
Will forward to provider  

## 2020-09-14 NOTE — Telephone Encounter (Signed)
Returned pt call and made aware of Dr. Laural Benes response pt doesn't have any questions or concerns

## 2020-09-14 NOTE — Telephone Encounter (Signed)
Copied from CRM [id number]. Topic: General - Other >> Sep 14, 2020  8:51 AM Gaetana Michaelis A wrote: Reason for CRM: Patient has previously received a prescription for an antibiotic to treat urinary issues from Dr. Laural Benes Patient's son has mistakenly thrown medication away and patient is uncertain of how to get another prescription  Please contact to advise   Patient saw Dr. Laural Benes yesterday but is [place]'s patient. Please f/u if appropriate.

## 2020-09-15 ENCOUNTER — Ambulatory Visit: Payer: Self-pay | Admitting: *Deleted

## 2020-09-15 NOTE — Telephone Encounter (Signed)
Pt called stating she is taking Bactrim; she is concerned that her urine is discolored; she says her urine is "brown"; she is having also having headache and nausea; the pt says she has taken 2 doses of the med; recommendations made per nurse triage protocol; pt advised to hydrate (8-10 8 oz of liquid); the pt would like to discuss this with her PCP; she can be contacted at [phone]; she is seen at [facility]; will route for provider review.  Reason for Disposition . [1] Caller has NON-URGENT medicine question about med that PCP prescribed AND [2] triager unable to answer question . [1] Caller has URGENT medicine question about med that PCP or specialist prescribed AND [2] triager unable to answer question  Answer Assessment - Initial Assessment Questions 1. NAME of MEDICATION: "What medicine are you calling about?"     Bactrim 2. QUESTION: "What is your question?" (e.g., medication refill, side effect)     My urine is brown 3. PRESCRIBING HCP: "Who prescribed it?" Reason: if prescribed by specialist, call should be referred to that group.    Dr Jonah Blue 4. SYMPTOMS: "Do you have any symptoms?"   Nausea and "light headache" 5. SEVERITY: If symptoms are present, ask "Are they mild, moderate or severe?"    6. PREGNANCY:  "Is there any chance that you are pregnant?" "When was your last menstrual period?"  Protocols used: MEDICATION QUESTION CALL-A-AH

## 2020-09-15 NOTE — Telephone Encounter (Signed)
Called patient to go over concerns.  She only notices blood when she voids and wipes.  She does not have to wear a sanitary napkin. LMP around 08/25/20.  Denies pelvic or abdominal pain, odor, or  blood with BM.  Has noticed being a little nauseated. No emesis episode.   She states she has been drinking lots of water but would like to know if the MD can provide their input.  Pls advise.

## 2020-09-16 ENCOUNTER — Telehealth: Payer: Self-pay | Admitting: *Deleted

## 2020-09-16 NOTE — Telephone Encounter (Addendum)
Attempt to call patient x 2 to advise on the f/u from Dr. Laural Benes. Left message on voicemail to return call.    Marcine Matar, MD  You 10 hours ago (9:55 PM)     Tell patient to take the antibiotics as prescribed. If blood in the urine persists despite antibiotics then we will refer her to urology. Please give her a follow-up appointment with her PCP.   Routing comment

## 2020-09-16 NOTE — Telephone Encounter (Signed)
Patient is calling- she is very upset about her hematuria and feels she needs appointment with urology. Reviewed UA and the results and what they signify. She is calmer about her plan now that she understands the process.Patient is calling to report she is seeing an increase in blood in her urine- she does report she is seeing an increase in blood in urine. Color of urine is darker- but patient has been having improvement in her urgency and nocturia. Patient advised to continue antibiotic and continue to drink plenty of water, avoid caffeine and follow up with lab next week after treatment. Please call to report ant changes and concerns.

## 2020-09-26 ENCOUNTER — Ambulatory Visit: Payer: Medicaid Other | Admitting: Family Medicine

## 2020-11-28 DIAGNOSIS — F331 Major depressive disorder, recurrent, moderate: Secondary | ICD-10-CM | POA: Diagnosis not present

## 2020-12-14 NOTE — Progress Notes (Deleted)
Patient ID: Nicole [name], female    DOB: [birth year]  MRN: [id number]  CC: Possible Yeast Infection   Subjective: Nicole [name] is a 45 y.o. female who presents for possible yeast infection.  Her concerns today include:   1. Possible Yeast Infection:  Menarche:  LMP: Menstruation Flow: Light _0  Medium _1  Heavy _2  Menstruation Length:  Menstruation Frequency: Amenorrhea: Yes _3  No _4  Dysmenorrhea: Breakthrough Bleeding:  Menopause:  Postmenopausal Bleeding:  Pregnancy:   Vaginal Discharge:  Discharge Amount:  Discharge Color:  Discharge Consistency:  Discharge Odor:  Currently sexually active:  Sexual Orientation:  Do you use protection such as birth control or condoms:  Pain with intercourse:  Pelvic pain:  Burning with urination:  Foul odor urine: Hematuria:      Patient Active Problem List   Diagnosis Date Noted  . Anxiety and depression 06/11/2018  . Vitamin D deficiency 01/08/2018  . Osteoarthritis 01/08/2018  . Type 2 diabetes mellitus ([place]) 01/08/2018  . Morbid obesity ([place]) 09/06/2016  . Acute appendicitis 03/25/2014  . Hypertension 03/25/2014     Current Outpatient Medications on File Prior to Visit  Medication Sig Dispense Refill  . albuterol (VENTOLIN HFA) 108 (90 Base) MCG/ACT inhaler Inhale 2 puffs into the lungs every 6 (six) hours as needed for wheezing or shortness of breath. 18 g 6  . Aspirin-Salicylamide-Caffeine (BC HEADACHE POWDER PO) Take 1 packet by mouth 2 (two) times daily as needed (headaches). (Patient not taking: Reported on 06/27/2020)    . Blood Glucose Monitoring Suppl (ACCU-CHEK AVIVA) device Use as instructed daily. (Patient not taking: Reported on 06/27/2020) 1 each 0  . glimepiride (AMARYL) 2 MG tablet Take 1 tablet (2 mg total) by mouth daily before breakfast. 30 tablet 3  . glucose blood (ACCU-CHEK AVIVA) test strip Use daily (Patient not taking: Reported on 06/27/2020) 30 each 12  . hydrOXYzine (ATARAX/VISTARIL) 10 MG  tablet Take 1 tablet (10 mg total) by mouth 2 (two) times daily as needed. (Patient not taking: Reported on 09/22/2019) 60 tablet 6  . Lancets Misc. (ACCU-CHEK SOFTCLIX LANCET DEV) KIT 1 each by Does not apply route daily. (Patient not taking: Reported on 06/27/2020) 1 kit 12  . meloxicam (MOBIC) 7.5 MG tablet Take 1 tablet (7.5 mg total) by mouth daily. (Patient not taking: Reported on 05/24/2020) 30 tablet 1  . sulfamethoxazole-trimethoprim (BACTRIM DS) 800-160 MG tablet Take 1 tablet by mouth 2 (two) times daily. 10 tablet 0  . tiotropium (SPIRIVA HANDIHALER) 18 MCG inhalation capsule Place 1 capsule (18 mcg total) into inhaler and inhale daily. 30 capsule 6  . Vitamin D, Ergocalciferol, (DRISDOL) 1.25 MG (50000 UNIT) CAPS capsule Take 1 capsule (50,000 Units total) by mouth every 7 (seven) days. 12 capsule 0   No current facility-administered medications on file prior to visit.    No Known Allergies  Social History   Socioeconomic History  . Marital status: Single    Spouse name: Not on file  . Number of children: Not on file  . Years of education: Not on file  . Highest education level: Not on file  Occupational History  . Not on file  Tobacco Use  . Smoking status: Current Every Day Smoker    Packs/day: 0.50    Years: 27.00    Pack years: 13.50    Types: Cigarettes  . Smokeless tobacco: Never Used  . Tobacco comment: 6 cigs daily  Vaping Use  . Vaping Use: Never used  Substance and Sexual Activity  .  Alcohol use: Yes    Comment: occ  . Drug use: Yes    Frequency: 7.0 times per week    Types: Marijuana  . Sexual activity: Not Currently    Comment: 5 years  Other Topics Concern  . Not on file  Social History Narrative  . Not on file   Social Determinants of Health   Financial Resource Strain: Not on file  Food Insecurity: No Food Insecurity  . Worried About Charity fundraiser in the Last Year: Never true  . Ran Out of Food in the Last Year: Never true   Transportation Needs: No Transportation Needs  . Lack of Transportation (Medical): No  . Lack of Transportation (Non-Medical): No  Physical Activity: Not on file  Stress: Not on file  Social Connections: Not on file  Intimate Partner Violence: Not on file    Family History  Problem Relation Age of Onset  . Hypertension Mother   . Cancer Mother        pancreatic cancer  . Arthritis Father   . Heart disease Maternal Grandmother     Past Surgical History:  Procedure Laterality Date  . APPENDECTOMY    . CESAREAN SECTION    . HERNIA REPAIR    . LAPAROSCOPIC APPENDECTOMY N/A 03/25/2014   Procedure: APPENDECTOMY LAPAROSCOPIC;  Surgeon: Gwenyth Ober, MD;  Location: [place];  Service: General;  Laterality: N/A;    ROS: Review of Systems Negative except as stated above  PHYSICAL EXAM: There were no vitals taken for this visit.  Physical Exam  {female adult master:310786} {female adult master:310785}  CMP Latest Ref Rng & Units 06/15/2019 10/09/2017 12/08/2016  Glucose 65 - 99 mg/dL 174(H) 93 125(H)  BUN 6 - 24 mg/dL _0 Creatinine 0.57 - 1.00 mg/dL 0.90 1.15(H) 0.89  Sodium 134 - 144 mmol/L 140 140 135  Potassium 3.5 - 5.2 mmol/L 4.9 4.9 3.8  Chloride 96 - 106 mmol/L 102 104 106  CO2 20 - 29 mmol/L _1 Calcium 8.7 - 10.2 mg/dL 9.7 9.2 8.6(L)  Total Protein 6.0 - 8.5 g/dL 7.1 7.0 -  Total Bilirubin 0.0 - 1.2 mg/dL 0.2 0.2 -  Alkaline Phos 39 - 117 IU/L 127(H) 86 -  AST 0 - 40 IU/L 30 22 -  ALT 0 - 32 IU/L 34(H) 20 -   Lipid Panel     Component Value Date/Time   CHOL 172 12/15/2019 0933   TRIG 111 12/15/2019 0933   HDL 58 12/15/2019 0933   CHOLHDL 3.0 12/15/2019 0933   CHOLHDL 3.8 09/06/2016 1018   LDLCALC 94 12/15/2019 0933    CBC    Component Value Date/Time   WBC 11.3 (H) 10/09/2017 1458   WBC 9.7 12/08/2016 1453   RBC 4.96 10/09/2017 1458   RBC 4.76 12/08/2016 1453   HGB 12.9 10/09/2017 1458   HCT 40.4 10/09/2017 1458   PLT 260 10/09/2017 1458    MCV 82 10/09/2017 1458   MCH 26.0 (L) 10/09/2017 1458   MCH 26.3 12/08/2016 1453   MCHC 31.9 10/09/2017 1458   MCHC 32.0 12/08/2016 1453   RDW 14.4 10/09/2017 1458   LYMPHSABS 3.2 (H) 10/09/2017 1458   MONOABS 342 09/06/2016 1018   EOSABS 0.2 10/09/2017 1458   BASOSABS 0.0 10/09/2017 1458    ASSESSMENT AND PLAN:  There are no diagnoses linked to this encounter.   Patient was given the opportunity to ask questions.  Patient verbalized understanding of the plan  and was able to repeat key elements of the plan. Patient was given clear instructions to go to Emergency Department or return to medical center if symptoms don't improve, worsen, or new problems develop.The patient verbalized understanding.   No orders of the defined types were placed in this encounter.    Requested Prescriptions    No prescriptions requested or ordered in this encounter    No follow-ups on file.  Camillia Herter, NP

## 2020-12-16 ENCOUNTER — Other Ambulatory Visit (HOSPITAL_COMMUNITY)
Admission: RE | Admit: 2020-12-16 | Discharge: 2020-12-16 | Disposition: A | Discharge status: Home / Self Care | Payer: Medicaid Other | Source: Ambulatory Visit | Attending: Family | Admitting: Family

## 2020-12-16 ENCOUNTER — Other Ambulatory Visit: Payer: Self-pay

## 2020-12-16 ENCOUNTER — Ambulatory Visit: Payer: Medicaid Other | Admitting: Family

## 2020-12-16 ENCOUNTER — Ambulatory Visit (INDEPENDENT_AMBULATORY_CARE_PROVIDER_SITE_OTHER): Payer: Medicaid Other | Admitting: Family

## 2020-12-16 ENCOUNTER — Encounter: Payer: Self-pay | Admitting: Family

## 2020-12-16 VITALS — BP 135/86 | HR 74 | Temp 98.7°F | Resp 15 | Ht 68.0 in | Wt 345.6 lb

## 2020-12-16 DIAGNOSIS — Z113 Encounter for screening for infections with a predominantly sexual mode of transmission: Secondary | ICD-10-CM | POA: Diagnosis not present

## 2020-12-16 LAB — POCT URINALYSIS DIP (CLINITEK)
Bilirubin, UA: NEGATIVE
Blood, UA: NEGATIVE
Glucose, UA: NEGATIVE mg/dL
Ketones, POC UA: NEGATIVE mg/dL
Nitrite, UA: NEGATIVE
POC PROTEIN,UA: NEGATIVE
Spec Grav, UA: 1.02 (ref 1.010–1.025)
Urobilinogen, UA: 0.2 E.U./dL
pH, UA: 5.5 (ref 5.0–8.0)

## 2020-12-16 NOTE — Progress Notes (Signed)
Urinalysis discussed in office.

## 2020-12-16 NOTE — Progress Notes (Signed)
Vaginal itching No discharge, fishy odor just started about 2 days ago

## 2020-12-16 NOTE — Progress Notes (Signed)
Patient ID: Nicole [name], female    DOB: [birth year]  MRN: [id number]  CC: Vaginitis (Yeast infection )   Subjective: Nicole [name] is a 45 y.o. female who presents for vaginitis.   Her concerns today include:   1. VAGINITIS: Onset: 1 day Description: no discharge Odor: fishy Itching: yes Vaginal burning: no Dysuria: no Bleeding: no Pelvic pain: no Back pain: no Fever: no Genital sores: no   Rash: no Dyspareunia: no, she is in a monogamous relationship Comments: Reports she did change from her usual Dove soap to another brand.   Patient Active Problem List   Diagnosis Date Noted  . Bacterial vaginitis 12/19/2020  . Anxiety and depression 06/11/2018  . Vitamin D deficiency 01/08/2018  . Osteoarthritis 01/08/2018  . Type 2 diabetes mellitus ([place]) 01/08/2018  . Morbid obesity ([place]) 09/06/2016  . Acute appendicitis 03/25/2014  . Hypertension 03/25/2014     Current Outpatient Medications on File Prior to Visit  Medication Sig Dispense Refill  . albuterol (VENTOLIN HFA) 108 (90 Base) MCG/ACT inhaler Inhale 2 puffs into the lungs every 6 (six) hours as needed for wheezing or shortness of breath. 18 g 6  . Aspirin-Salicylamide-Caffeine (BC HEADACHE POWDER PO) Take 1 packet by mouth 2 (two) times daily as needed (headaches). (Patient not taking: Reported on 06/27/2020)    . Blood Glucose Monitoring Suppl (ACCU-CHEK AVIVA) device Use as instructed daily. (Patient not taking: Reported on 06/27/2020) 1 each 0  . glimepiride (AMARYL) 2 MG tablet Take 1 tablet (2 mg total) by mouth daily before breakfast. 30 tablet 3  . glucose blood (ACCU-CHEK AVIVA) test strip Use daily (Patient not taking: Reported on 06/27/2020) 30 each 12  . hydrOXYzine (ATARAX/VISTARIL) 10 MG tablet Take 1 tablet (10 mg total) by mouth 2 (two) times daily as needed. (Patient not taking: Reported on 09/22/2019) 60 tablet 6  . Lancets Misc. (ACCU-CHEK SOFTCLIX LANCET DEV) KIT 1 each by Does not apply route  daily. (Patient not taking: Reported on 06/27/2020) 1 kit 12  . meloxicam (MOBIC) 7.5 MG tablet Take 1 tablet (7.5 mg total) by mouth daily. (Patient not taking: Reported on 05/24/2020) 30 tablet 1  . sulfamethoxazole-trimethoprim (BACTRIM DS) 800-160 MG tablet Take 1 tablet by mouth 2 (two) times daily. 10 tablet 0  . tiotropium (SPIRIVA HANDIHALER) 18 MCG inhalation capsule Place 1 capsule (18 mcg total) into inhaler and inhale daily. 30 capsule 6  . Vitamin D, Ergocalciferol, (DRISDOL) 1.25 MG (50000 UNIT) CAPS capsule Take 1 capsule (50,000 Units total) by mouth every 7 (seven) days. 12 capsule 0   No current facility-administered medications on file prior to visit.    No Known Allergies  Social History   Socioeconomic History  . Marital status: Single    Spouse name: Not on file  . Number of children: Not on file  . Years of education: Not on file  . Highest education level: Not on file  Occupational History  . Not on file  Tobacco Use  . Smoking status: Former Smoker    Packs/day: 0.50    Years: 27.00    Pack years: 13.50    Types: Cigarettes    Quit date: 10/28/2020    Years since quitting: 0.1  . Smokeless tobacco: Never Used  . Tobacco comment: 6 cigs daily  Vaping Use  . Vaping Use: Never used  Substance and Sexual Activity  . Alcohol use: Not Currently    Comment: occ  . Drug use: Yes  Frequency: 7.0 times per week    Types: Marijuana  . Sexual activity: Not Currently    Comment: 5 years  Other Topics Concern  . Not on file  Social History Narrative  . Not on file   Social Determinants of Health   Financial Resource Strain: Not on file  Food Insecurity: No Food Insecurity  . Worried About Charity fundraiser in the Last Year: Never true  . Ran Out of Food in the Last Year: Never true  Transportation Needs: No Transportation Needs  . Lack of Transportation (Medical): No  . Lack of Transportation (Non-Medical): No  Physical Activity: Not on file   Stress: Not on file  Social Connections: Not on file  Intimate Partner Violence: Not on file    Family History  Problem Relation Age of Onset  . Hypertension Mother   . Cancer Mother        pancreatic cancer  . Arthritis Father   . Heart disease Maternal Grandmother     Past Surgical History:  Procedure Laterality Date  . APPENDECTOMY    . CESAREAN SECTION    . HERNIA REPAIR    . LAPAROSCOPIC APPENDECTOMY N/A 03/25/2014   Procedure: APPENDECTOMY LAPAROSCOPIC;  Surgeon: Gwenyth Ober, MD;  Location: [place];  Service: General;  Laterality: N/A;    ROS: Review of Systems Negative except as stated above  PHYSICAL EXAM: BP 135/86 (BP Location: Left Arm, Patient Position: Sitting, Cuff Size: Large)   Pulse 74   Temp 98.7 F (37.1 C)   Resp 15   Ht 5' 8"  (1.727 m)   Wt (!) 345 lb 9.6 oz (156.8 kg)   SpO2 95%   BMI 52.55 kg/m   Physical Exam HENT:     Head: Normocephalic.  Eyes:     Extraocular Movements: Extraocular movements intact.     Conjunctiva/sclera: Conjunctivae normal.     Pupils: Pupils are equal, round, and reactive to light.  Cardiovascular:     Rate and Rhythm: Normal rate and regular rhythm.     Heart sounds: Normal heart sounds.  Pulmonary:     Effort: Pulmonary effort is normal.     Breath sounds: Normal breath sounds.  Musculoskeletal:     Cervical back: Normal range of motion and neck supple.  Neurological:     General: No focal deficit present.     Mental Status: She is alert and oriented to person, place, and time.  Psychiatric:        Mood and Affect: Mood normal.        Behavior: Behavior normal.    ASSESSMENT AND PLAN: 1. Routine screening for STI (sexually transmitted infection): - Urinalysis negative for urinary tract infection. - Cervicovaginal self-swab to screen for chlamydia, gonorrhea, trichomonas, bacterial vaginitis, and candida vaginitis. - Cervicovaginal ancillary only - POCT URINALYSIS DIP (CLINITEK)   Patient was given  the opportunity to ask questions.  Patient verbalized understanding of the plan and was able to repeat key elements of the plan. Patient was given clear instructions to go to Emergency Department or return to medical center if symptoms don't improve, worsen, or new problems develop.The patient verbalized understanding.   Orders Placed This Encounter  Procedures  . POCT URINALYSIS DIP (CLINITEK)   Follow-up with primary provider as scheduled.   Camillia Herter, NP

## 2020-12-16 NOTE — Patient Instructions (Signed)
Vaginitis  Vaginitis is irritation and swelling of the vagina. Treatment will depend on the cause. What are the causes? It can be caused by:  Bacteria.  Yeast.  A parasite.  A virus.  Low hormone levels.  Bubble baths, scented tampons, and feminine sprays. Other things can change the balance of the yeast and bacteria that live in the vagina. These include:  Antibiotic medicines.  Not being clean enough.  Some birth control methods.  Sex.  Infection.  Diabetes.  A weakened body defense system (immune system). What increases the risk?  Smoking or being around someone who smokes.  Using washes (douches), scented tampons, or scented pads.  Wearing tight pants or thong underwear.  Using birth control pills or an IUD.  Having sex without a condom or having a lot of partners.  Having an STI.  Using a certain product to kill sperm (nonoxynol-9).  Eating foods that are high in sugar.  Having diabetes.  Having low levels of a female hormone.  Having a weakened body defense system.  Being pregnant or breastfeeding. What are the signs or symptoms?  Fluid coming from the vagina that is not normal.  A bad smell.  Itching, pain, or swelling.  Pain with sex.  Pain or burning when you pee (urinate). Sometimes there are no symptoms. How is this treated? Treatment may include:  Antibiotic creams or pills.  Antifungal medicines.  Medicines to ease symptoms if you have a virus. Your sex partner should also be treated.  Estrogen medicines.  Avoiding scented soaps, sprays, or douches.  Stopping use of products that caused irritation and then using a cream to treat symptoms. Follow these instructions at home: Lifestyle  Keep the area around your vagina clean and dry. ? Avoid using soap. ? Rinse the area with water.  Until your doctor says it is okay: ? Do not use washes for the vagina. ? Do not use tampons. ? Do not have sex.  Wipe from front to  back after going to the bathroom.  When your doctor says it is okay, practice safe sex and use condoms. General instructions  Take over-the-counter and prescription medicines only as told by your doctor.  If you were prescribed an antibiotic medicine, take or use it as told by your doctor. Do not stop taking or using it even if you start to feel better.  Keep all follow-up visits. How is this prevented?  Do not use things that can irritate the vagina, such as fabric softeners. Avoid these products if they are scented: ? Sprays. ? Detergents. ? Tampons. ? Products for cleaning the vagina. ? Soaps or bubble baths.  Let air reach your vagina. To do this: ? Wear cotton underwear. ? Do not wear:  Underwear while you sleep.  Tight pants.  Thong underwear.  Underwear or nylons without a cotton panel. ? Take off any wet clothing, such as bathing suits, as soon as you can. ? Practice safe sex and use condoms. Contact a doctor if:  You have pain in your belly or in the area between your hips.  You have a fever or chills.  Your symptoms last for more than 2-3 days. Get help right away if:  You have a fever and your symptoms get worse all of a sudden. Summary  Vaginitis is irritation and swelling of the vagina.  Treatment will depend on the cause of the condition.  Do not use washes or tampons or have sex until your doctor says it   is okay. This information is not intended to replace advice given to you by your health care provider. Make sure you discuss any questions you have with your health care provider. Document Revised: 01/14/2020 Document Reviewed: 01/14/2020 Elsevier Patient Education  2021 [employer]  

## 2020-12-19 ENCOUNTER — Other Ambulatory Visit: Payer: Self-pay | Admitting: Family

## 2020-12-19 DIAGNOSIS — N760 Acute vaginitis: Secondary | ICD-10-CM

## 2020-12-19 DIAGNOSIS — F331 Major depressive disorder, recurrent, moderate: Secondary | ICD-10-CM | POA: Diagnosis not present

## 2020-12-19 DIAGNOSIS — B9689 Other specified bacterial agents as the cause of diseases classified elsewhere: Secondary | ICD-10-CM

## 2020-12-19 LAB — CERVICOVAGINAL ANCILLARY ONLY
Bacterial Vaginitis (gardnerella): POSITIVE — AB
Candida Glabrata: NEGATIVE
Candida Vaginitis: NEGATIVE
Chlamydia: NEGATIVE
Comment: NEGATIVE
Comment: NEGATIVE
Comment: NEGATIVE
Comment: NEGATIVE
Comment: NEGATIVE
Comment: NORMAL
Neisseria Gonorrhea: NEGATIVE
Trichomonas: NEGATIVE

## 2020-12-19 MED ORDER — METRONIDAZOLE 500 MG PO TABS: 500.0000 mg | ORAL_TABLET | Freq: Two times a day (BID) | ORAL | 0 refills | Status: AC

## 2020-12-19 NOTE — Progress Notes (Signed)
Gonorrhea, Chlamydia, Trichomonas, and Yeast Infection negative.   Vaginal swab positive for Bacterial Vaginitis, an overgrowth of normal bacteria in the vagina due to changes in pH often related to semen, menstrual periods, or soaps. I have prescribed Flagyl twice per day for 7 days. Do not drink alcohol while taking this medication.

## 2021-01-01 ENCOUNTER — Other Ambulatory Visit: Payer: Self-pay

## 2021-01-01 ENCOUNTER — Encounter (HOSPITAL_COMMUNITY): Payer: Self-pay | Admitting: *Deleted

## 2021-01-01 ENCOUNTER — Ambulatory Visit (HOSPITAL_COMMUNITY)
Admission: EM | Admit: 2021-01-01 | Discharge: 2021-01-01 | Disposition: A | Discharge status: Home / Self Care | Payer: Medicaid Other | Attending: Family Medicine | Admitting: Family Medicine

## 2021-01-01 DIAGNOSIS — J029 Acute pharyngitis, unspecified: Secondary | ICD-10-CM

## 2021-01-01 MED ORDER — LIDOCAINE VISCOUS HCL 2 % MT SOLN: 10.0000 mL | OROMUCOSAL | 0 refills | Status: DC | PRN

## 2021-01-01 MED ORDER — AMOXICILLIN 875 MG PO TABS: 875.0000 mg | ORAL_TABLET | Freq: Two times a day (BID) | ORAL | 0 refills | Status: DC

## 2021-01-01 NOTE — ED Triage Notes (Signed)
Pt reports she first felt the sore spot on the inside of her throat 2 weeks ago. Pt reports the feeling went away and now is back. Pt also HA,Lt era pain.

## 2021-01-01 NOTE — ED Provider Notes (Signed)
[place]    CSN: [id number] Arrival date & time: 01/01/21  1416      History   Chief Complaint Chief Complaint  Patient presents with  . throat problem    HPI Nicole [name] is a 45 y.o. female.   Patient presenting today with 2-week history of sore, swollen throat that has acutely worsened in the past 2 days.  States that she feels a lump on her tonsil now and the pain is radiating to left ear, causing headache.  Denies known fever, chills, chest pain, shortness of breath, abdominal pain, nausea vomiting or diarrhea.  Able to swallow but very painful to do so.  Has been taking Aleve with minimal relief.  Does have a history of seasonal allergies, states that allergy medication is never worked for her so she does not take them at this time.     Past Medical History:  Diagnosis Date  . Arthritis    knees  . Hypertension   . Ovarian cyst   . Pregnancy induced hypertension     Patient Active Problem List   Diagnosis Date Noted  . Bacterial vaginitis 12/19/2020  . Anxiety and depression 06/11/2018  . Vitamin D deficiency 01/08/2018  . Osteoarthritis 01/08/2018  . Type 2 diabetes mellitus ([place]) 01/08/2018  . Morbid obesity ([place]) 09/06/2016  . Acute appendicitis 03/25/2014  . Hypertension 03/25/2014    Past Surgical History:  Procedure Laterality Date  . APPENDECTOMY    . CESAREAN SECTION    . HERNIA REPAIR    . LAPAROSCOPIC APPENDECTOMY N/A 03/25/2014   Procedure: APPENDECTOMY LAPAROSCOPIC;  Surgeon: Gwenyth Ober, MD;  Location: [facility];  Service: General;  Laterality: N/A;    OB History    Gravida  2   Para  2   Term  1   Preterm  1   AB  0   Living  2     SAB      IAB      Ectopic      Multiple      Live Births  2            Home Medications    Prior to Admission medications   Medication Sig Start Date End Date Taking? Authorizing Provider  amoxicillin (AMOXIL) 875 MG tablet Take 1 tablet (875 mg total) by mouth 2 (two)  times daily. 01/01/21  Yes Volney American, PA-C  lidocaine (XYLOCAINE) 2 % solution Use as directed 10 mLs in the mouth or throat as needed for mouth pain. 01/01/21  Yes Volney American, PA-C  albuterol (VENTOLIN HFA) 108 (90 Base) MCG/ACT inhaler Inhale 2 puffs into the lungs every 6 (six) hours as needed for wheezing or shortness of breath. 04/20/20   [name], MD  Aspirin-Salicylamide-Caffeine (BC HEADACHE POWDER PO) Take 1 packet by mouth 2 (two) times daily as needed (headaches). Patient not taking: Reported on 06/27/2020    [provider]  Blood Glucose Monitoring Suppl (ACCU-CHEK AVIVA) device Use as instructed daily. Patient not taking: Reported on 06/27/2020 01/08/18   [name], MD  glimepiride (AMARYL) 2 MG tablet Take 1 tablet (2 mg total) by mouth daily before breakfast. 05/25/20   [name], MD  glucose blood (ACCU-CHEK AVIVA) test strip Use daily Patient not taking: Reported on 06/27/2020 01/08/18   [name], MD  hydrOXYzine (ATARAX/VISTARIL) 10 MG tablet Take 1 tablet (10 mg total) by mouth 2 (two) times daily as needed. Patient not taking: Reported on  09/22/2019 06/15/19   [name], MD  Lancets Misc. (ACCU-CHEK SOFTCLIX LANCET DEV) KIT 1 each by Does not apply route daily. Patient not taking: Reported on 06/27/2020 01/08/18   [name], MD  meloxicam (MOBIC) 7.5 MG tablet Take 1 tablet (7.5 mg total) by mouth daily. Patient not taking: Reported on 05/24/2020 12/14/19   [name], MD  sulfamethoxazole-trimethoprim (BACTRIM DS) 800-160 MG tablet Take 1 tablet by mouth 2 (two) times daily. 09/14/20   Ladell Pier, MD  tiotropium (SPIRIVA HANDIHALER) 18 MCG inhalation capsule Place 1 capsule (18 mcg total) into inhaler and inhale daily. 06/15/19   [name], MD  Vitamin D, Ergocalciferol, (DRISDOL) 1.25 MG (50000 UNIT) CAPS capsule Take 1 capsule (50,000 Units total) by mouth every 7 (seven) days. 12/16/19   [name], MD    Family History Family History  Problem Relation Age of Onset  . Hypertension Mother   . Cancer Mother        pancreatic cancer  . Arthritis Father   . Heart disease Maternal Grandmother     Social History Social History   Tobacco Use  . Smoking status: Former Smoker    Packs/day: 0.50    Years: 27.00    Pack years: 13.50    Types: Cigarettes    Quit date: 10/28/2020    Years since quitting: 0.1  . Smokeless tobacco: Never Used  . Tobacco comment: 6 cigs daily  Vaping Use  . Vaping Use: Never used  Substance Use Topics  . Alcohol use: Not Currently    Comment: occ  . Drug use: Yes    Frequency: 7.0 times per week    Types: Marijuana     Allergies   Patient has no known allergies.   Review of Systems Review of Systems Per HPI  Physical Exam Triage Vital Signs ED Triage Vitals  Enc Vitals Group     BP 01/01/21 1528 (!) 155/107     Pulse Rate 01/01/21 1528 74     Resp 01/01/21 1528 18     Temp 01/01/21 1528 98.3 F (36.8 C)     Temp src --      SpO2 01/01/21 1528 97 %     Weight --      Height --      Head Circumference --      Peak Flow --      Pain Score 01/01/21 1524 6     Pain Loc --      Pain Edu? --      Excl. in [place]? --    No data found.  Updated Vital Signs BP (!) 155/107   Pulse 74   Temp 98.3 F (36.8 C)   Resp 18   SpO2 97%   Visual Acuity Right Eye Distance:   Left Eye Distance:   Bilateral Distance:    Right Eye Near:   Left Eye Near:    Bilateral Near:     Physical Exam Vitals and nursing note reviewed.  Constitutional:      Appearance: Normal appearance. She is not ill-appearing.  HENT:     Head: Atraumatic.     Right Ear: Tympanic membrane normal.     Left Ear: Tympanic membrane normal.     Nose:     Comments: Bilateral nasal turbinates erythematous and edematous    Mouth/Throat:     Mouth: Mucous membranes are moist.     Pharynx: Oropharyngeal exudate and posterior oropharyngeal erythema present.  Comments: Bilateral tonsillar erythema and edema Uvula midline, oral airway patent Eyes:     Extraocular Movements: Extraocular movements intact.     Conjunctiva/sclera: Conjunctivae normal.  Cardiovascular:     Rate and Rhythm: Normal rate and regular rhythm.     Heart sounds: Normal heart sounds.  Pulmonary:     Effort: Pulmonary effort is normal. No respiratory distress.     Breath sounds: Normal breath sounds. No wheezing or rales.  Abdominal:     General: Bowel sounds are normal. There is no distension.     Tenderness: There is no abdominal tenderness.  Musculoskeletal:        General: Normal range of motion.     Cervical back: Normal range of motion and neck supple.  Lymphadenopathy:     Cervical: Cervical adenopathy present.  Skin:    General: Skin is warm and dry.  Neurological:     Mental Status: She is alert and oriented to person, place, and time.  Psychiatric:        Mood and Affect: Mood normal.        Thought Content: Thought content normal.        Judgment: Judgment normal.    UC Treatments / Results  Labs (all labs ordered are listed, but only abnormal results are displayed) Labs Reviewed - No data to display  EKG   Radiology No results found.  Procedures Procedures (including critical care time)  Medications Ordered in UC Medications - No data to display  Initial Impression / Assessment and Plan / UC Course  I have reviewed the triage vital signs and the nursing notes.  Pertinent labs & imaging results that were available during my care of the patient were reviewed by me and considered in my medical decision making (see chart for details).     Given duration of symptoms and severity of throat pain and swelling, will treat with amoxicillin, viscous lidocaine and discussed importance of starting and remaining on a good allergy regimen with antihistamines, twice daily Flonase nasal spray.  Follow-up if worsening or not resolving.  Strep test  deferred today as treating regardless.  Final Clinical Impressions(s) / UC Diagnoses   Final diagnoses:  Acute pharyngitis, unspecified etiology   Discharge Instructions   None    ED Prescriptions    Medication Sig Dispense Auth. Provider   amoxicillin (AMOXIL) 875 MG tablet Take 1 tablet (875 mg total) by mouth 2 (two) times daily. 20 tablet Volney American, [place]   lidocaine (XYLOCAINE) 2 % solution Use as directed 10 mLs in the mouth or throat as needed for mouth pain. 100 mL Volney American, [place]     PDMP not reviewed this encounter.   Volney American, [place] 01/01/21 1622

## 2021-02-10 ENCOUNTER — Emergency Department (HOSPITAL_COMMUNITY): Payer: Medicaid Other

## 2021-02-10 ENCOUNTER — Other Ambulatory Visit: Payer: Self-pay

## 2021-02-10 ENCOUNTER — Encounter (HOSPITAL_COMMUNITY): Payer: Self-pay | Admitting: *Deleted

## 2021-02-10 ENCOUNTER — Inpatient Hospital Stay (HOSPITAL_COMMUNITY)
Admission: EM | Admit: 2021-02-10 | Discharge: 2021-02-15 | DRG: 872 | Disposition: A | Discharge status: Home / Self Care | Payer: Medicaid Other | Attending: Internal Medicine | Admitting: Internal Medicine

## 2021-02-10 DIAGNOSIS — K760 Fatty (change of) liver, not elsewhere classified: Secondary | ICD-10-CM | POA: Diagnosis not present

## 2021-02-10 DIAGNOSIS — Z79899 Other long term (current) drug therapy: Secondary | ICD-10-CM

## 2021-02-10 DIAGNOSIS — R109 Unspecified abdominal pain: Secondary | ICD-10-CM

## 2021-02-10 DIAGNOSIS — Z87891 Personal history of nicotine dependence: Secondary | ICD-10-CM

## 2021-02-10 DIAGNOSIS — I5189 Other ill-defined heart diseases: Secondary | ICD-10-CM | POA: Diagnosis not present

## 2021-02-10 DIAGNOSIS — Z20822 Contact with and (suspected) exposure to covid-19: Secondary | ICD-10-CM | POA: Diagnosis present

## 2021-02-10 DIAGNOSIS — M170 Bilateral primary osteoarthritis of knee: Secondary | ICD-10-CM | POA: Diagnosis present

## 2021-02-10 DIAGNOSIS — E876 Hypokalemia: Secondary | ICD-10-CM | POA: Diagnosis not present

## 2021-02-10 DIAGNOSIS — I4510 Unspecified right bundle-branch block: Secondary | ICD-10-CM | POA: Diagnosis present

## 2021-02-10 DIAGNOSIS — F1210 Cannabis abuse, uncomplicated: Secondary | ICD-10-CM | POA: Diagnosis present

## 2021-02-10 DIAGNOSIS — E1165 Type 2 diabetes mellitus with hyperglycemia: Secondary | ICD-10-CM | POA: Diagnosis present

## 2021-02-10 DIAGNOSIS — R000 Tachycardia, unspecified: Secondary | ICD-10-CM | POA: Diagnosis not present

## 2021-02-10 DIAGNOSIS — F1910 Other psychoactive substance abuse, uncomplicated: Secondary | ICD-10-CM | POA: Diagnosis present

## 2021-02-10 DIAGNOSIS — Z7984 Long term (current) use of oral hypoglycemic drugs: Secondary | ICD-10-CM

## 2021-02-10 DIAGNOSIS — G4730 Sleep apnea, unspecified: Secondary | ICD-10-CM | POA: Diagnosis present

## 2021-02-10 DIAGNOSIS — F419 Anxiety disorder, unspecified: Secondary | ICD-10-CM | POA: Diagnosis present

## 2021-02-10 DIAGNOSIS — K5792 Diverticulitis of intestine, part unspecified, without perforation or abscess without bleeding: Secondary | ICD-10-CM | POA: Diagnosis not present

## 2021-02-10 DIAGNOSIS — Z791 Long term (current) use of non-steroidal anti-inflammatories (NSAID): Secondary | ICD-10-CM

## 2021-02-10 DIAGNOSIS — R1011 Right upper quadrant pain: Secondary | ICD-10-CM | POA: Diagnosis not present

## 2021-02-10 DIAGNOSIS — I119 Hypertensive heart disease without heart failure: Secondary | ICD-10-CM | POA: Diagnosis present

## 2021-02-10 DIAGNOSIS — R778 Other specified abnormalities of plasma proteins: Secondary | ICD-10-CM | POA: Diagnosis not present

## 2021-02-10 DIAGNOSIS — R0682 Tachypnea, not elsewhere classified: Secondary | ICD-10-CM

## 2021-02-10 DIAGNOSIS — A4151 Sepsis due to Escherichia coli [E. coli]: Principal | ICD-10-CM | POA: Diagnosis present

## 2021-02-10 DIAGNOSIS — E1169 Type 2 diabetes mellitus with other specified complication: Secondary | ICD-10-CM

## 2021-02-10 DIAGNOSIS — Z6841 Body Mass Index (BMI) 40.0 and over, adult: Secondary | ICD-10-CM

## 2021-02-10 DIAGNOSIS — Z8249 Family history of ischemic heart disease and other diseases of the circulatory system: Secondary | ICD-10-CM

## 2021-02-10 DIAGNOSIS — K5732 Diverticulitis of large intestine without perforation or abscess without bleeding: Secondary | ICD-10-CM | POA: Diagnosis present

## 2021-02-10 DIAGNOSIS — F32A Depression, unspecified: Secondary | ICD-10-CM | POA: Diagnosis present

## 2021-02-10 DIAGNOSIS — I441 Atrioventricular block, second degree: Secondary | ICD-10-CM | POA: Diagnosis not present

## 2021-02-10 DIAGNOSIS — Z800 Family history of malignant neoplasm of digestive organs: Secondary | ICD-10-CM

## 2021-02-10 DIAGNOSIS — J90 Pleural effusion, not elsewhere classified: Secondary | ICD-10-CM | POA: Diagnosis not present

## 2021-02-10 DIAGNOSIS — Z8261 Family history of arthritis: Secondary | ICD-10-CM

## 2021-02-10 LAB — URINALYSIS, ROUTINE W REFLEX MICROSCOPIC
Bilirubin Urine: NEGATIVE
Glucose, UA: NEGATIVE mg/dL
Ketones, ur: NEGATIVE mg/dL
Nitrite: NEGATIVE
Protein, ur: NEGATIVE mg/dL
Specific Gravity, Urine: 1.013 (ref 1.005–1.030)
pH: 5 (ref 5.0–8.0)

## 2021-02-10 LAB — CBC WITH DIFFERENTIAL/PLATELET
Abs Immature Granulocytes: 0.05 10*3/uL (ref 0.00–0.07)
Basophils Absolute: 0 10*3/uL (ref 0.0–0.1)
Basophils Relative: 0 %
Eosinophils Absolute: 0.1 10*3/uL (ref 0.0–0.5)
Eosinophils Relative: 1 %
HCT: 40.7 % (ref 36.0–46.0)
Hemoglobin: 13 g/dL (ref 12.0–15.0)
Immature Granulocytes: 0 %
Lymphocytes Relative: 7 %
Lymphs Abs: 1 10*3/uL (ref 0.7–4.0)
MCH: 26.9 pg (ref 26.0–34.0)
MCHC: 31.9 g/dL (ref 30.0–36.0)
MCV: 84.3 fL (ref 80.0–100.0)
Monocytes Absolute: 0.7 10*3/uL (ref 0.1–1.0)
Monocytes Relative: 5 %
Neutro Abs: 11.8 10*3/uL — ABNORMAL HIGH (ref 1.7–7.7)
Neutrophils Relative %: 87 %
Platelets: 210 10*3/uL (ref 150–400)
RBC: 4.83 MIL/uL (ref 3.87–5.11)
RDW: 13.8 % (ref 11.5–15.5)
WBC: 13.8 10*3/uL — ABNORMAL HIGH (ref 4.0–10.5)
nRBC: 0 % (ref 0.0–0.2)

## 2021-02-10 LAB — COMPREHENSIVE METABOLIC PANEL
ALT: 40 U/L (ref 0–44)
AST: 40 U/L (ref 15–41)
Albumin: 3.3 g/dL — ABNORMAL LOW (ref 3.5–5.0)
Alkaline Phosphatase: 103 U/L (ref 38–126)
Anion gap: 9 (ref 5–15)
BUN: 15 mg/dL (ref 6–20)
CO2: 23 mmol/L (ref 22–32)
Calcium: 9.4 mg/dL (ref 8.9–10.3)
Chloride: 104 mmol/L (ref 98–111)
Creatinine, Ser: 1.08 mg/dL — ABNORMAL HIGH (ref 0.44–1.00)
GFR, Estimated: >60 mL/min (ref >60)
Glucose, Bld: 227 mg/dL — ABNORMAL HIGH (ref 70–99)
Potassium: 4.2 mmol/L (ref 3.5–5.1)
Sodium: 136 mmol/L (ref 135–145)
Total Bilirubin: 0.7 mg/dL (ref 0.3–1.2)
Total Protein: 6.9 g/dL (ref 6.5–8.1)

## 2021-02-10 LAB — I-STAT BETA HCG BLOOD, ED (MC, WL, AP ONLY): I-stat hCG, quantitative: <5 m[IU]/mL (ref <5)

## 2021-02-10 LAB — RESP PANEL BY RT-PCR (FLU A&B, COVID) ARPGX2
Influenza A by PCR: NEGATIVE
Influenza B by PCR: NEGATIVE
SARS Coronavirus 2 by RT PCR: NEGATIVE

## 2021-02-10 LAB — LIPASE, BLOOD: Lipase: 31 U/L (ref 11–51)

## 2021-02-10 MED ORDER — ACETAMINOPHEN 500 MG PO TABS
1000.0000 mg | ORAL_TABLET | Freq: Once | ORAL | Status: AC
  Administered 2021-02-10: 1000 mg via ORAL
  Filled 2021-02-10: qty 2

## 2021-02-10 MED ORDER — MORPHINE SULFATE (PF) 4 MG/ML IV SOLN
4.0000 mg | Freq: Once | INTRAVENOUS | Status: AC
  Administered 2021-02-10: 4 mg via INTRAVENOUS
  Filled 2021-02-10: qty 1

## 2021-02-10 MED ORDER — SODIUM CHLORIDE 0.9 % IV BOLUS
1000.0000 mL | Freq: Once | INTRAVENOUS | Status: AC
  Administered 2021-02-10: 1000 mL via INTRAVENOUS

## 2021-02-10 MED ORDER — ONDANSETRON HCL 4 MG/2ML IJ SOLN
4.0000 mg | Freq: Once | INTRAMUSCULAR | Status: AC
  Administered 2021-02-10: 4 mg via INTRAVENOUS
  Filled 2021-02-10: qty 2

## 2021-02-10 NOTE — ED Provider Notes (Signed)
Emergency Medicine Provider Triage Evaluation Note  Nicole [name] , a 45 y.o. female  was evaluated in triage.  Pt complains of RUQ. Started this morning, constant. Sharp, no alleviating factors tried. No nausea or vomiting. H/O appendectomy.  Review of Systems  Positive: RUQ, fever Negative: N,V  Physical Exam  BP (!) 147/83   Pulse 90   Temp (!) 102.3 F (39.1 C) (Oral)   Resp (!) 24   Ht 5\' 8"  (1.727 m)   Wt (!) 156.8 kg   SpO2 99%   BMI 52.56 kg/m  Gen:   Awake, no distress   Resp:  Normal effort  MSK:   Moves extremities without difficulty  Other:  RUQ tenderness, positive murphy   Medical Decision Making  Medically screening exam initiated at 6:43 PM.  Appropriate orders placed.  Nicole [name] was informed that the remainder of the evaluation will be completed by another provider, this initial triage assessment does not replace that evaluation, and the importance of remaining in the ED until their evaluation is complete.     Marcheta Grammes, PA-C 02/10/21 1844    02/12/21, MD 02/10/21 [phone]

## 2021-02-10 NOTE — ED Provider Notes (Signed)
[facility] EMERGENCY DEPARTMENT Provider Note   CSN: [id number] Arrival date & time: 02/10/21  1825     History Chief Complaint  Patient presents with  . Abdominal Pain    Nicole [name] is a 45 y.o. female with a hx of hypertension, T2DM, anxiety, depression, & prior appendectomy who presents to the ED with complaints of abdominal pain that began earlier today.  Patient states the pain is in her right upper quadrant, it is nonradiating, constant, sharp, and without alleviating factors, it is worse with movement.  She had associated nausea with one episode of emesis.  She did have a loose bowel movement this morning and has had a fever.  She denies hematemesis, melena, hematochezia, chest pain, dyspnea, syncope, dysuria, vaginal bleeding, or vaginal discharge.  HPI     Past Medical History:  Diagnosis Date  . Arthritis    knees  . Hypertension   . Ovarian cyst   . Pregnancy induced hypertension     Patient Active Problem List   Diagnosis Date Noted  . Bacterial vaginitis 12/19/2020  . Anxiety and depression 06/11/2018  . Vitamin D deficiency 01/08/2018  . Osteoarthritis 01/08/2018  . Type 2 diabetes mellitus ([place]) 01/08/2018  . Morbid obesity ([place]) 09/06/2016  . Acute appendicitis 03/25/2014  . Hypertension 03/25/2014    Past Surgical History:  Procedure Laterality Date  . APPENDECTOMY    . CESAREAN SECTION    . HERNIA REPAIR    . LAPAROSCOPIC APPENDECTOMY N/A 03/25/2014   Procedure: APPENDECTOMY LAPAROSCOPIC;  Surgeon: Gwenyth Ober, MD;  Location: [facility];  Service: General;  Laterality: N/A;     OB History     Gravida  2   Para  2   Term  1   Preterm  1   AB  0   Living  2      SAB      IAB      Ectopic      Multiple      Live Births  2           Family History  Problem Relation Age of Onset  . Hypertension Mother   . Cancer Mother        pancreatic cancer  . Arthritis Father   . Heart disease Maternal  Grandmother     Social History   Tobacco Use  . Smoking status: Former    Packs/day: 0.50    Years: 27.00    Pack years: 13.50    Types: Cigarettes    Quit date: 10/28/2020    Years since quitting: 0.2  . Smokeless tobacco: Never  . Tobacco comments:    6 cigs daily  Vaping Use  . Vaping Use: Never used  Substance Use Topics  . Alcohol use: Not Currently    Comment: occ  . Drug use: Yes    Frequency: 7.0 times per week    Types: Marijuana    Home Medications Prior to Admission medications   Medication Sig Start Date End Date Taking? Authorizing Provider  albuterol (VENTOLIN HFA) 108 (90 Base) MCG/ACT inhaler Inhale 2 puffs into the lungs every 6 (six) hours as needed for wheezing or shortness of breath. 04/20/20   [name], MD  amoxicillin (AMOXIL) 875 MG tablet Take 1 tablet (875 mg total) by mouth 2 (two) times daily. 01/01/21   Volney American, PA-C  Aspirin-Salicylamide-Caffeine (BC HEADACHE POWDER PO) Take 1 packet by mouth 2 (two) times daily as needed (  headaches). Patient not taking: Reported on 06/27/2020    [provider]  Blood Glucose Monitoring Suppl (ACCU-CHEK AVIVA) device Use as instructed daily. Patient not taking: Reported on 06/27/2020 01/08/18   [name], MD  glimepiride (AMARYL) 2 MG tablet Take 1 tablet (2 mg total) by mouth daily before breakfast. 05/25/20   [name], MD  glucose blood (ACCU-CHEK AVIVA) test strip Use daily Patient not taking: Reported on 06/27/2020 01/08/18   [name], MD  hydrOXYzine (ATARAX/VISTARIL) 10 MG tablet Take 1 tablet (10 mg total) by mouth 2 (two) times daily as needed. Patient not taking: Reported on 09/22/2019 06/15/19   [name], MD  Lancets Misc. (ACCU-CHEK SOFTCLIX LANCET DEV) KIT 1 each by Does not apply route daily. Patient not taking: Reported on 06/27/2020 01/08/18   [name], MD  lidocaine (XYLOCAINE) 2 % solution Use as directed 10 mLs in the mouth or throat as  needed for mouth pain. 01/01/21   Volney American, PA-C  meloxicam (MOBIC) 7.5 MG tablet Take 1 tablet (7.5 mg total) by mouth daily. Patient not taking: Reported on 05/24/2020 12/14/19   [name], MD  sulfamethoxazole-trimethoprim (BACTRIM DS) 800-160 MG tablet Take 1 tablet by mouth 2 (two) times daily. 09/14/20   Ladell Pier, MD  tiotropium (SPIRIVA HANDIHALER) 18 MCG inhalation capsule Place 1 capsule (18 mcg total) into inhaler and inhale daily. 06/15/19   [name], MD  Vitamin D, Ergocalciferol, (DRISDOL) 1.25 MG (50000 UNIT) CAPS capsule Take 1 capsule (50,000 Units total) by mouth every 7 (seven) days. 12/16/19   [name], MD    Allergies    Patient has no known allergies.  Review of Systems   Review of Systems  Constitutional:  Positive for fever.  Respiratory:  Negative for cough and shortness of breath.   Cardiovascular:  Negative for chest pain.  Gastrointestinal:  Positive for abdominal pain, diarrhea, nausea and vomiting.  Genitourinary:  Negative for dysuria, vaginal bleeding and vaginal discharge.  Neurological:  Negative for syncope.  All other systems reviewed and are negative.  Physical Exam Updated Vital Signs BP (!) 151/83 (BP Location: Left Arm)   Pulse 91   Temp 99.8 F (37.7 C) (Oral)   Resp 15   Ht 5' 8"  (1.727 m)   Wt (!) 156.8 kg   SpO2 100%   BMI 52.56 kg/m   Physical Exam Vitals and nursing note reviewed.  Constitutional:      General: She is not in acute distress.    Appearance: She is well-developed. She is not toxic-appearing.  HENT:     Head: Normocephalic and atraumatic.  Eyes:     General:        Right eye: No discharge.        Left eye: No discharge.     Conjunctiva/sclera: Conjunctivae normal.  Cardiovascular:     Rate and Rhythm: Normal rate and regular rhythm.  Pulmonary:     Effort: Pulmonary effort is normal. No respiratory distress.     Breath sounds: Normal breath sounds. No wheezing, rhonchi or  rales.  Abdominal:     General: There is no distension.     Palpations: Abdomen is soft.     Tenderness: There is abdominal tenderness (generalized, more prominent in the upper abdomen). Positive signs include Murphy's sign.  Musculoskeletal:     Cervical back: Neck supple.  Skin:    General: Skin is warm and dry.     Findings: No rash.  Neurological:  Mental Status: She is alert.     Comments: Clear speech.   Psychiatric:        Behavior: Behavior normal.    ED Results / Procedures / Treatments   Labs (all labs ordered are listed, but only abnormal results are displayed) Labs Reviewed  CBC WITH DIFFERENTIAL/PLATELET - Abnormal; Notable for the following components:      Result Value   WBC 13.8 (*)    Neutro Abs 11.8 (*)    All other components within normal limits  COMPREHENSIVE METABOLIC PANEL - Abnormal; Notable for the following components:   Glucose, Bld 227 (*)    Creatinine, Ser 1.08 (*)    Albumin 3.3 (*)    All other components within normal limits  URINALYSIS, ROUTINE W REFLEX MICROSCOPIC - Abnormal; Notable for the following components:   Hgb urine dipstick MODERATE (*)    Leukocytes,Ua TRACE (*)    Bacteria, UA MANY (*)    All other components within normal limits  RESP PANEL BY RT-PCR (FLU A&B, COVID) ARPGX2  LIPASE, BLOOD  I-STAT BETA HCG BLOOD, ED ([facility], [facility], [facility] ONLY)    EKG None  Radiology CT Abdomen Pelvis W Contrast  Result Date: 02/11/2021 CLINICAL DATA:  Acute abdominal pain EXAM: CT ABDOMEN AND PELVIS WITH CONTRAST TECHNIQUE: Multidetector CT imaging of the abdomen and pelvis was performed using the standard protocol following bolus administration of intravenous contrast. CONTRAST:  145m OMNIPAQUE IOHEXOL 300 MG/ML  SOLN COMPARISON:  Right upper quadrant ultrasound dated 02/10/2021. CT abdomen/pelvis dated 03/25/2014. FINDINGS: Lower chest: Lung bases are clear. Hepatobiliary: Liver is within normal limits. Gallbladder is unremarkable. No  intrahepatic or extrahepatic ductal dilatation. Pancreas: Within normal limits. Spleen: Within normal limits. Adrenals/Urinary Tract: Adrenal glands are within normal limits. Kidneys are within normal limits.  No hydronephrosis. Bladder is within normal limits. Stomach/Bowel: Stomach is within normal limits. No evidence of bowel obstruction. Prior appendectomy. Sigmoid diverticulosis with mild pericolonic inflammatory changes in the left lower quadrant (coronal image 40), suggesting mild sigmoid diverticulitis. No drainable fluid collection/abscess. No free air to suggest macroscopic perforation. Vascular/Lymphatic: No evidence of abdominal aortic aneurysm. Atherosclerotic calcifications of the abdominal aorta and branch vessels. No suspicious abdominopelvic lymphadenopathy. Reproductive: Heterogeneous uterus, suggesting uterine fibroids. 2.0 cm fat density left ovarian lesion (series 3/image 69), suggesting a left ovarian dermoid, grossly unchanged. No right adnexal mass. Other: No abdominopelvic ascites. Musculoskeletal: Degenerative changes of the visualized thoracolumbar spine. IMPRESSION: Mild sigmoid diverticulitis. No drainable fluid collection/abscess. No free air. 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 Electronically Signed   By: SJulian HyM.D.   On: 02/11/2021 01:21   [place]domen Limited RUQ (LIVER/GB)  Result Date: 02/10/2021 CLINICAL DATA:  Nausea, vomiting, abdominal pain EXAM: ULTRASOUND ABDOMEN LIMITED RIGHT UPPER QUADRANT COMPARISON:  11/14/2016 FINDINGS: Gallbladder: No gallstones or wall thickening visualized. No sonographic Murphy sign noted by sonographer. Common bile duct: Diameter: 6 mm in proximal diameter Liver: The hepatic parenchymal echogenicity is mildly, diffusely increased, similar to that noted on prior examination, in keeping with changes of mild hepatic steatosis. No focal intrahepatic lesions are identified. No intrahepatic biliary ductal dilation. Portal vein is  patent on color Doppler imaging with normal direction of blood flow towards the liver. Other: None. IMPRESSION: Stable mild hepatic steatosis. Electronically Signed   By: AFidela SalisburyMD   On: 02/10/2021 23:15    Procedures Procedures   Medications Ordered in ED Medications  acetaminophen (TYLENOL) tablet 1,000 mg (1,000 mg Oral Given 02/10/21 1853)  ED Course  I have reviewed the triage vital signs and the nursing notes.  Pertinent labs & imaging results that were available during my care of the patient were reviewed by me and considered in my medical decision making (see chart for details).    MDM Rules/Calculators/A&P                          Patient presents to the ED with complaints of abdominal pain. Patient's initial fever improved with antipyretics, she is overall nontoxic appearing. Generalized abdominal tenderness worse in the upper abdomen more so to the RUQ.    Additional history obtained:  Additional history obtained from chart review & nursing note review.   Lab Tests:  I reviewed and interpreted labs, which included:  CBC: Leukocytosis @ 13.8.  CMP: Hyperglycemia without acidosis or anion gap elevation. Renal function similar to prior. No LFT elevation.  Lipase: WNL UA: Many bacteria, sent for culture.  Preg test: negative Covid/flu: Negative  Imaging Studies ordered:  I ordered imaging studies which included RUQ [place] and subsequently CT A/P, I independently reviewed, formal radiology impression shows:  RUQ [place]: Stable mild hepatic steatosis CT A/P: Mild sigmoid diverticulitis. No drainable fluid collection/abscess. No free air. 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 Unable to control pain S/p morphine & subsequent dilaudid, will obtain blood cultures & lactic acid given patient meets SIRS criteria, start IV abx, and discuss with hospitalist service for admission. Discussed w/ attending Dr. Roxanne Mins- in  agreement.   03:15: CONSULT: Discussed with hositalist Dr. Nevada Crane- accepts admission.   Portions of this note were generated with [employer]. Dictation errors may occur despite best attempts at proofreading.   Final Clinical Impression(s) / ED Diagnoses Final diagnoses:  Abdominal pain  Diverticulitis    Rx / DC Orders ED Discharge Orders     None        Amaryllis Dyke, PA-C 33/74/45 1460    Delora Fuel, MD 47/99/87 [id number]

## 2021-02-10 NOTE — ED Triage Notes (Signed)
Abd since this am with temp   she has not had the covid vaccine she has not had any fever reducer

## 2021-02-11 ENCOUNTER — Emergency Department (HOSPITAL_COMMUNITY): Payer: Medicaid Other

## 2021-02-11 DIAGNOSIS — J90 Pleural effusion, not elsewhere classified: Secondary | ICD-10-CM | POA: Diagnosis not present

## 2021-02-11 DIAGNOSIS — K5792 Diverticulitis of intestine, part unspecified, without perforation or abscess without bleeding: Secondary | ICD-10-CM | POA: Diagnosis not present

## 2021-02-11 DIAGNOSIS — R0600 Dyspnea, unspecified: Secondary | ICD-10-CM | POA: Diagnosis not present

## 2021-02-11 DIAGNOSIS — Z87891 Personal history of nicotine dependence: Secondary | ICD-10-CM | POA: Diagnosis not present

## 2021-02-11 DIAGNOSIS — B9620 Unspecified Escherichia coli [E. coli] as the cause of diseases classified elsewhere: Secondary | ICD-10-CM | POA: Diagnosis not present

## 2021-02-11 DIAGNOSIS — J9811 Atelectasis: Secondary | ICD-10-CM | POA: Diagnosis not present

## 2021-02-11 DIAGNOSIS — R7989 Other specified abnormal findings of blood chemistry: Secondary | ICD-10-CM | POA: Diagnosis not present

## 2021-02-11 DIAGNOSIS — Z8261 Family history of arthritis: Secondary | ICD-10-CM | POA: Diagnosis not present

## 2021-02-11 DIAGNOSIS — F32A Depression, unspecified: Secondary | ICD-10-CM | POA: Diagnosis present

## 2021-02-11 DIAGNOSIS — E1165 Type 2 diabetes mellitus with hyperglycemia: Secondary | ICD-10-CM | POA: Diagnosis not present

## 2021-02-11 DIAGNOSIS — R000 Tachycardia, unspecified: Secondary | ICD-10-CM | POA: Diagnosis not present

## 2021-02-11 DIAGNOSIS — R1011 Right upper quadrant pain: Secondary | ICD-10-CM | POA: Diagnosis not present

## 2021-02-11 DIAGNOSIS — R079 Chest pain, unspecified: Secondary | ICD-10-CM | POA: Diagnosis not present

## 2021-02-11 DIAGNOSIS — K5732 Diverticulitis of large intestine without perforation or abscess without bleeding: Secondary | ICD-10-CM | POA: Diagnosis not present

## 2021-02-11 DIAGNOSIS — R778 Other specified abnormalities of plasma proteins: Secondary | ICD-10-CM | POA: Diagnosis not present

## 2021-02-11 DIAGNOSIS — M170 Bilateral primary osteoarthritis of knee: Secondary | ICD-10-CM | POA: Diagnosis present

## 2021-02-11 DIAGNOSIS — Z7984 Long term (current) use of oral hypoglycemic drugs: Secondary | ICD-10-CM | POA: Diagnosis not present

## 2021-02-11 DIAGNOSIS — F1910 Other psychoactive substance abuse, uncomplicated: Secondary | ICD-10-CM | POA: Diagnosis present

## 2021-02-11 DIAGNOSIS — I4510 Unspecified right bundle-branch block: Secondary | ICD-10-CM | POA: Diagnosis present

## 2021-02-11 DIAGNOSIS — I441 Atrioventricular block, second degree: Secondary | ICD-10-CM | POA: Diagnosis not present

## 2021-02-11 DIAGNOSIS — R0682 Tachypnea, not elsewhere classified: Secondary | ICD-10-CM | POA: Diagnosis not present

## 2021-02-11 DIAGNOSIS — Z20822 Contact with and (suspected) exposure to covid-19: Secondary | ICD-10-CM | POA: Diagnosis present

## 2021-02-11 DIAGNOSIS — F1210 Cannabis abuse, uncomplicated: Secondary | ICD-10-CM | POA: Diagnosis present

## 2021-02-11 DIAGNOSIS — F419 Anxiety disorder, unspecified: Secondary | ICD-10-CM | POA: Diagnosis present

## 2021-02-11 DIAGNOSIS — Z6841 Body Mass Index (BMI) 40.0 and over, adult: Secondary | ICD-10-CM | POA: Diagnosis not present

## 2021-02-11 DIAGNOSIS — Z8249 Family history of ischemic heart disease and other diseases of the circulatory system: Secondary | ICD-10-CM | POA: Diagnosis not present

## 2021-02-11 DIAGNOSIS — R0789 Other chest pain: Secondary | ICD-10-CM | POA: Diagnosis not present

## 2021-02-11 DIAGNOSIS — R7881 Bacteremia: Secondary | ICD-10-CM | POA: Diagnosis not present

## 2021-02-11 DIAGNOSIS — I517 Cardiomegaly: Secondary | ICD-10-CM | POA: Diagnosis not present

## 2021-02-11 DIAGNOSIS — Z79899 Other long term (current) drug therapy: Secondary | ICD-10-CM | POA: Diagnosis not present

## 2021-02-11 DIAGNOSIS — Z791 Long term (current) use of non-steroidal anti-inflammatories (NSAID): Secondary | ICD-10-CM | POA: Diagnosis not present

## 2021-02-11 DIAGNOSIS — R109 Unspecified abdominal pain: Secondary | ICD-10-CM | POA: Diagnosis not present

## 2021-02-11 DIAGNOSIS — K760 Fatty (change of) liver, not elsewhere classified: Secondary | ICD-10-CM | POA: Diagnosis not present

## 2021-02-11 DIAGNOSIS — A4151 Sepsis due to Escherichia coli [E. coli]: Secondary | ICD-10-CM | POA: Diagnosis present

## 2021-02-11 DIAGNOSIS — I119 Hypertensive heart disease without heart failure: Secondary | ICD-10-CM | POA: Diagnosis present

## 2021-02-11 LAB — BLOOD CULTURE ID PANEL (REFLEXED) - BCID2

## 2021-02-11 LAB — CBC
HCT: 42.3 % (ref 36.0–46.0)
Hemoglobin: 13.6 g/dL (ref 12.0–15.0)
MCH: 27 pg (ref 26.0–34.0)
MCHC: 32.2 g/dL (ref 30.0–36.0)
MCV: 84.1 fL (ref 80.0–100.0)
Platelets: 196 10*3/uL (ref 150–400)
RBC: 5.03 MIL/uL (ref 3.87–5.11)
RDW: 14 % (ref 11.5–15.5)
WBC: 17.8 10*3/uL — ABNORMAL HIGH (ref 4.0–10.5)
nRBC: 0 % (ref 0.0–0.2)

## 2021-02-11 LAB — GLUCOSE, CAPILLARY
Glucose-Capillary: 279 mg/dL — ABNORMAL HIGH (ref 70–99)
Glucose-Capillary: 223 mg/dL — ABNORMAL HIGH (ref 70–99)
Glucose-Capillary: 203 mg/dL — ABNORMAL HIGH (ref 70–99)
Glucose-Capillary: 194 mg/dL — ABNORMAL HIGH (ref 70–99)

## 2021-02-11 LAB — HEMOGLOBIN A1C
Hgb A1c MFr Bld: 10 % — ABNORMAL HIGH (ref 4.8–5.6)
Mean Plasma Glucose: 240.3 mg/dL

## 2021-02-11 LAB — CBG MONITORING, ED: Glucose-Capillary: 302 mg/dL — ABNORMAL HIGH (ref 70–99)

## 2021-02-11 LAB — PHOSPHORUS: Phosphorus: 3 mg/dL (ref 2.5–4.6)

## 2021-02-11 LAB — HIV ANTIBODY (ROUTINE TESTING W REFLEX): HIV Screen 4th Generation wRfx: NONREACTIVE

## 2021-02-11 LAB — CREATININE, SERUM
Creatinine, Ser: 1.01 mg/dL — ABNORMAL HIGH (ref 0.44–1.00)
GFR, Estimated: >60 mL/min (ref >60)

## 2021-02-11 LAB — LACTIC ACID, PLASMA
Lactic Acid, Venous: 2.7 mmol/L (ref 0.5–1.9)
Lactic Acid, Venous: 3 mmol/L (ref 0.5–1.9)

## 2021-02-11 LAB — MAGNESIUM: Magnesium: 1.6 mg/dL — ABNORMAL LOW (ref 1.7–2.4)

## 2021-02-11 MED ORDER — MAGNESIUM SULFATE 2 GM/50ML IV SOLN
2.0000 g | Freq: Once | INTRAVENOUS | Status: AC
  Administered 2021-02-11: 2 g via INTRAVENOUS
  Filled 2021-02-11: qty 50

## 2021-02-11 MED ORDER — SODIUM CHLORIDE 0.9 % IV SOLN
2.0000 g | Freq: Once | INTRAVENOUS | Status: AC
  Administered 2021-02-11: 2 g via INTRAVENOUS
  Filled 2021-02-11: qty 20

## 2021-02-11 MED ORDER — IOHEXOL 300 MG/ML  SOLN
100.0000 mL | Freq: Once | INTRAMUSCULAR | Status: AC | PRN
  Administered 2021-02-11: 100 mL via INTRAVENOUS

## 2021-02-11 MED ORDER — ENOXAPARIN SODIUM 80 MG/0.8ML IJ SOSY
75.0000 mg | PREFILLED_SYRINGE | Freq: Every day | INTRAMUSCULAR | Status: DC
  Administered 2021-02-11 – 2021-02-12 (×2): 75 mg via SUBCUTANEOUS
  Filled 2021-02-11 (×2): qty 0.8
  Filled 2021-02-11: qty 0.75

## 2021-02-11 MED ORDER — LACTATED RINGERS IV SOLN: INTRAVENOUS | Status: DC

## 2021-02-11 MED ORDER — INSULIN ASPART 100 UNIT/ML IJ SOLN
0.0000 [IU] | Freq: Three times a day (TID) | INTRAMUSCULAR | Status: DC
  Administered 2021-02-11: 7 [IU] via SUBCUTANEOUS
  Administered 2021-02-11: 3 [IU] via SUBCUTANEOUS
  Administered 2021-02-11: 5 [IU] via SUBCUTANEOUS
  Administered 2021-02-12 (×3): 3 [IU] via SUBCUTANEOUS

## 2021-02-11 MED ORDER — METOPROLOL TARTRATE 5 MG/5ML IV SOLN
2.5000 mg | Freq: Four times a day (QID) | INTRAVENOUS | Status: DC | PRN
  Administered 2021-02-12 – 2021-02-13 (×2): 2.5 mg via INTRAVENOUS
  Filled 2021-02-11 (×2): qty 5

## 2021-02-11 MED ORDER — INSULIN ASPART 100 UNIT/ML IJ SOLN
0.0000 [IU] | Freq: Every day | INTRAMUSCULAR | Status: DC
  Administered 2021-02-11 – 2021-02-12 (×2): 2 [IU] via SUBCUTANEOUS

## 2021-02-11 MED ORDER — SODIUM CHLORIDE 0.9 % IV SOLN
12.5000 mg | Freq: Three times a day (TID) | INTRAVENOUS | Status: DC | PRN
  Filled 2021-02-11 (×3): qty 0.5

## 2021-02-11 MED ORDER — SENNA 8.6 MG PO TABS
1.0000 | ORAL_TABLET | Freq: Every day | ORAL | Status: DC
  Administered 2021-02-11 – 2021-02-15 (×4): 8.6 mg via ORAL
  Filled 2021-02-11 (×6): qty 1

## 2021-02-11 MED ORDER — HYDROMORPHONE HCL 1 MG/ML IJ SOLN
1.0000 mg | INTRAMUSCULAR | Status: DC | PRN
  Administered 2021-02-11 – 2021-02-13 (×8): 1 mg via INTRAVENOUS
  Filled 2021-02-11 (×10): qty 1

## 2021-02-11 MED ORDER — OXYCODONE HCL 5 MG PO TABS
5.0000 mg | ORAL_TABLET | Freq: Four times a day (QID) | ORAL | Status: DC | PRN
  Administered 2021-02-11 – 2021-02-14 (×6): 5 mg via ORAL
  Filled 2021-02-11 (×6): qty 1

## 2021-02-11 MED ORDER — METRONIDAZOLE 500 MG/100ML IV SOLN
500.0000 mg | Freq: Once | INTRAVENOUS | Status: AC
  Administered 2021-02-11: 500 mg via INTRAVENOUS
  Filled 2021-02-11: qty 100

## 2021-02-11 MED ORDER — ONDANSETRON HCL 4 MG/2ML IJ SOLN
4.0000 mg | Freq: Four times a day (QID) | INTRAMUSCULAR | Status: DC | PRN
  Administered 2021-02-11 – 2021-02-13 (×6): 4 mg via INTRAVENOUS
  Filled 2021-02-11 (×5): qty 2

## 2021-02-11 MED ORDER — METRONIDAZOLE 500 MG/100ML IV SOLN
500.0000 mg | Freq: Three times a day (TID) | INTRAVENOUS | Status: DC
  Administered 2021-02-11 – 2021-02-12 (×4): 500 mg via INTRAVENOUS
  Filled 2021-02-11 (×3): qty 100

## 2021-02-11 MED ORDER — ACETAMINOPHEN 325 MG PO TABS
650.0000 mg | ORAL_TABLET | Freq: Four times a day (QID) | ORAL | Status: DC | PRN
  Administered 2021-02-11 – 2021-02-14 (×4): 650 mg via ORAL
  Filled 2021-02-11 (×3): qty 2

## 2021-02-11 MED ORDER — HYDROMORPHONE HCL 1 MG/ML IJ SOLN
1.0000 mg | Freq: Once | INTRAMUSCULAR | Status: AC
Start: 2021-02-11 — End: 2021-02-11
  Administered 2021-02-11: 1 mg via INTRAVENOUS
  Filled 2021-02-11: qty 1

## 2021-02-11 MED ORDER — HYDROMORPHONE HCL 1 MG/ML IJ SOLN
1.0000 mg | Freq: Once | INTRAMUSCULAR | Status: AC
  Administered 2021-02-11: 1 mg via INTRAVENOUS
  Filled 2021-02-11: qty 1

## 2021-02-11 MED ORDER — ALBUTEROL SULFATE HFA 108 (90 BASE) MCG/ACT IN AERS
2.0000 | INHALATION_SPRAY | Freq: Four times a day (QID) | RESPIRATORY_TRACT | Status: DC | PRN
  Filled 2021-02-11: qty 6.7

## 2021-02-11 MED ORDER — SODIUM CHLORIDE 0.9 % IV SOLN
2.0000 g | Freq: Every day | INTRAVENOUS | Status: DC
  Administered 2021-02-12 – 2021-02-13 (×2): 2 g via INTRAVENOUS
  Filled 2021-02-11 (×2): qty 20

## 2021-02-11 MED ORDER — SODIUM CHLORIDE 0.9 % IV BOLUS
1000.0000 mL | Freq: Once | INTRAVENOUS | Status: AC
  Administered 2021-02-11: 1000 mL via INTRAVENOUS

## 2021-02-11 NOTE — Progress Notes (Signed)
  PROGRESS NOTE  Patient admitted earlier this morning. See H&P.   Nicole [name] is a 45 year old female with past medical history significant for diabetes who presented with complaints of right upper quadrant abdominal pain.  Work-up in the emergency department revealed acute sigmoid diverticulitis, gallbladder appeared unremarkable without intra or extrahepatic duct dilation.  Patient was started on Rocephin and IV Flagyl.  Continues to have pain in her right upper quadrant.  However right upper quadrant abdominal ultrasound was unremarkable, no etiology for right upper quadrant abdominal pain seen on CT abdomen pelvis, pancreas within normal limit, lipase normal Continue Rocephin, Flagyl Replace magnesium Pain control, antiemetic as needed Continue clear liquid diet   Status is: Inpatient  Remains inpatient appropriate because:IV treatments appropriate due to intensity of illness or inability to take PO  Dispo:  Patient From: Home  Planned Disposition: Home  Medically stable for discharge: No         Noralee Stain, DO [facility] 02/11/2021, 10:47 AM  Available via Epic secure chat 7am-7pm After these hours, please refer to coverage provider listed on [email or web address]

## 2021-02-11 NOTE — Progress Notes (Addendum)
Returned call to E.D for a report.

## 2021-02-11 NOTE — H&P (Addendum)
History and Physical  [name] WSF:[id number] DOB: [birth year] DOA: 02/10/2021  Referring physician: Kennith Maes, PA-EDP. PCP: [name], MD  Outpatient Specialists: None. Patient coming from: Home.  Chief Complaint: Right upper quadrant abdominal pain, nausea, vomiting and diarrhea.  HPI: Nicole [name] is a 45 y.o. female with medical history significant for type 2 diabetes, severe morbid obesity, post appendectomy, who presented to [facility] ED with complaints of sudden onset right upper quadrant abdominal pain the morning of presentation.  Associated with persistent nausea and vomiting.  1 episode of loose bowel movement this morning.  Self reports subjective fevers, did not check her body temperature.  She presented to the ED for further evaluation and management.  Work-up in the ED revealed acute sigmoid diverticulitis seen on CT scan with no evidence of abscesses, no free air to suggest macroperforation.  Gallbladder was unremarkable, no intra or extrahepatic duct dilatation, no evidence of bowel obstruction.  She was started on Rocephin and IV Flagyl.  Ongoing nausea and vomiting in the ED at the time of this visit, right upper quadrant abdominal pain.  TRH, hospitalist team, was asked to admit.  ED Course:  Fever with T-max 102.3, BP 130/97, pulse 113, respiratory rate 24, O2 saturation 99% on room air.  Lab studies remarkable for serum glucose 227, BUN 15, creatinine 1.08, GFR greater than 60, lipase 31.  WBC 13.8, hemoglobin 13.0, platelet count 210.  Review of Systems: Review of systems as noted in the HPI. All other systems reviewed and are negative.   Past Medical History:  Diagnosis Date   Arthritis    knees   Hypertension    Ovarian cyst    Pregnancy induced hypertension    Past Surgical History:  Procedure Laterality Date   APPENDECTOMY     CESAREAN SECTION     HERNIA REPAIR     LAPAROSCOPIC APPENDECTOMY N/A 03/25/2014   Procedure: APPENDECTOMY  LAPAROSCOPIC;  Surgeon: Gwenyth Ober, MD;  Location: [place];  Service: General;  Laterality: N/A;    Social History:  reports that she quit smoking about 3 months ago. Her smoking use included cigarettes. She has a 13.50 pack-year smoking history. She has never used smokeless tobacco. She reports previous alcohol use. She reports current drug use. Frequency: 7.00 times per week. Drug: Marijuana.   No Known Allergies  Family History  Problem Relation Age of Onset   Hypertension Mother    Cancer Mother        pancreatic cancer   Arthritis Father    Heart disease Maternal Grandmother       Prior to Admission medications   Medication Sig Start Date End Date Taking? Authorizing Provider  albuterol (VENTOLIN HFA) 108 (90 Base) MCG/ACT inhaler Inhale 2 puffs into the lungs every 6 (six) hours as needed for wheezing or shortness of breath. 04/20/20   [name], MD  amoxicillin (AMOXIL) 875 MG tablet Take 1 tablet (875 mg total) by mouth 2 (two) times daily. 01/01/21   Volney American, PA-C  Aspirin-Salicylamide-Caffeine (BC HEADACHE POWDER PO) Take 1 packet by mouth 2 (two) times daily as needed (headaches). Patient not taking: Reported on 06/27/2020    [provider]  Blood Glucose Monitoring Suppl (ACCU-CHEK AVIVA) device Use as instructed daily. Patient not taking: Reported on 06/27/2020 01/08/18   [name], MD  glimepiride (AMARYL) 2 MG tablet Take 1 tablet (2 mg total) by mouth daily before breakfast. 05/25/20   [name], MD  glucose blood (ACCU-CHEK  AVIVA) test strip Use daily Patient not taking: Reported on 06/27/2020 01/08/18   [name], MD  hydrOXYzine (ATARAX/VISTARIL) 10 MG tablet Take 1 tablet (10 mg total) by mouth 2 (two) times daily as needed. Patient not taking: Reported on 09/22/2019 06/15/19   [name], MD  Lancets Misc. (ACCU-CHEK SOFTCLIX LANCET DEV) KIT 1 each by Does not apply route daily. Patient not taking: Reported on  06/27/2020 01/08/18   [name], MD  lidocaine (XYLOCAINE) 2 % solution Use as directed 10 mLs in the mouth or throat as needed for mouth pain. 01/01/21   Volney American, PA-C  meloxicam (MOBIC) 7.5 MG tablet Take 1 tablet (7.5 mg total) by mouth daily. Patient not taking: Reported on 05/24/2020 12/14/19   [name], MD  sulfamethoxazole-trimethoprim (BACTRIM DS) 800-160 MG tablet Take 1 tablet by mouth 2 (two) times daily. 09/14/20   Ladell Pier, MD  tiotropium (SPIRIVA HANDIHALER) 18 MCG inhalation capsule Place 1 capsule (18 mcg total) into inhaler and inhale daily. 06/15/19   [name], MD  Vitamin D, Ergocalciferol, (DRISDOL) 1.25 MG (50000 UNIT) CAPS capsule Take 1 capsule (50,000 Units total) by mouth every 7 (seven) days. 12/16/19   [name], MD    Physical Exam: BP (!) 182/84 (BP Location: Left Arm)   Pulse (!) 113   Temp 99.8 F (37.7 C) (Oral)   Resp (!) 24   Ht 5' 8"  (1.727 m)   Wt (!) 156.8 kg   SpO2 99%   BMI 52.56 kg/m   General: 45 y.o. year-old female severe morbid obesity, appears uncomfortable due to nausea and vomiting at the time of this visit.  Alert and oriented x3. Cardiovascular: Regular rate and rhythm with no rubs or gallops.  No thyromegaly or JVD noted.  No lower extremity edema. 2/4 pulses in all 4 extremities. Respiratory: Clear to auscultation with no wheezes or rales. Good inspiratory effort. Abdomen: Soft, obese, right upper quadrant tenderness with palpation.  Nondistended with bowel sounds noted. Muskuloskeletal: No cyanosis, clubbing or edema noted bilaterally Neuro: CN II-XII intact, strength, sensation, reflexes Skin: No ulcerative lesions noted or rashes Psychiatry: Judgement and insight appear normal. Mood is appropriate for condition and setting          Labs on Admission:  Basic Metabolic Panel: Recent Labs  Lab 02/10/21 1845  NA 136  K 4.2  CL 104  CO2 23  GLUCOSE 227*  BUN 15  CREATININE 1.08*   CALCIUM 9.4   Liver Function Tests: Recent Labs  Lab 02/10/21 1845  AST 40  ALT 40  ALKPHOS 103  BILITOT 0.7  PROT 6.9  ALBUMIN 3.3*   Recent Labs  Lab 02/10/21 1845  LIPASE 31   No results for input(s): AMMONIA in the last 168 hours. CBC: Recent Labs  Lab 02/10/21 1845 02/11/21 0353  WBC 13.8* 17.8*  NEUTROABS 11.8*  --   HGB 13.0 13.6  HCT 40.7 42.3  MCV 84.3 84.1  PLT 210 196   Cardiac Enzymes: No results for input(s): CKTOTAL, CKMB, CKMBINDEX, TROPONINI in the last 168 hours.  BNP (last 3 results) No results for input(s): BNP in the last 8760 hours.  ProBNP (last 3 results) No results for input(s): PROBNP in the last 8760 hours.  CBG: No results for input(s): GLUCAP in the last 168 hours.  Radiological Exams on Admission: CT Abdomen Pelvis W Contrast  Result Date: 02/11/2021 CLINICAL DATA:  Acute abdominal pain EXAM: CT ABDOMEN AND PELVIS WITH CONTRAST TECHNIQUE:  Multidetector CT imaging of the abdomen and pelvis was performed using the standard protocol following bolus administration of intravenous contrast. CONTRAST:  169m OMNIPAQUE IOHEXOL 300 MG/ML  SOLN COMPARISON:  Right upper quadrant ultrasound dated 02/10/2021. CT abdomen/pelvis dated 03/25/2014. FINDINGS: Lower chest: Lung bases are clear. Hepatobiliary: Liver is within normal limits. Gallbladder is unremarkable. No intrahepatic or extrahepatic ductal dilatation. Pancreas: Within normal limits. Spleen: Within normal limits. Adrenals/Urinary Tract: Adrenal glands are within normal limits. Kidneys are within normal limits.  No hydronephrosis. Bladder is within normal limits. Stomach/Bowel: Stomach is within normal limits. No evidence of bowel obstruction. Prior appendectomy. Sigmoid diverticulosis with mild pericolonic inflammatory changes in the left lower quadrant (coronal image 40), suggesting mild sigmoid diverticulitis. No drainable fluid collection/abscess. No free air to suggest macroscopic  perforation. Vascular/Lymphatic: No evidence of abdominal aortic aneurysm. Atherosclerotic calcifications of the abdominal aorta and branch vessels. No suspicious abdominopelvic lymphadenopathy. Reproductive: Heterogeneous uterus, suggesting uterine fibroids. 2.0 cm fat density left ovarian lesion (series 3/image 69), suggesting a left ovarian dermoid, grossly unchanged. No right adnexal mass. Other: No abdominopelvic ascites. Musculoskeletal: Degenerative changes of the visualized thoracolumbar spine. IMPRESSION: Mild sigmoid diverticulitis. No drainable fluid collection/abscess. No free air. 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 Electronically Signed   By: SJulian HyM.D.   On: 02/11/2021 01:21   UKoreaAbdomen Limited RUQ (LIVER/GB)  Result Date: 02/10/2021 CLINICAL DATA:  Nausea, vomiting, abdominal pain EXAM: ULTRASOUND ABDOMEN LIMITED RIGHT UPPER QUADRANT COMPARISON:  11/14/2016 FINDINGS: Gallbladder: No gallstones or wall thickening visualized. No sonographic Murphy sign noted by sonographer. Common bile duct: Diameter: 6 mm in proximal diameter Liver: The hepatic parenchymal echogenicity is mildly, diffusely increased, similar to that noted on prior examination, in keeping with changes of mild hepatic steatosis. No focal intrahepatic lesions are identified. No intrahepatic biliary ductal dilation. Portal vein is patent on color Doppler imaging with normal direction of blood flow towards the liver. Other: None. IMPRESSION: Stable mild hepatic steatosis. Electronically Signed   By: AFidela SalisburyMD   On: 02/10/2021 23:15    EKG: I independently viewed the EKG done and my findings are as followed: None available at the time of this visit.   Assessment/Plan Present on Admission:  Sigmoid diverticulitis  Active Problems:   Sigmoid diverticulitis  Sepsis secondary to acute sigmoid diverticulitis, seen on CT scan Presented with fever with T-max of 102.3, leukocytosis WBC 13.8, tachycardia  113, respiration rate 24, right upper quadrant pain nausea vomiting diarrhea No evidence of macroperforation or abscesses. Started on Rocephin and IV Flagyl, continue. Follow cultures for ID and sensitivities. Pain control with bowel regimen  Intractable nausea and vomiting, right upper quadrant abdominal pain Gallbladder was unremarkable, no intra or extrahepatic duct dilatation, no evidence of bowel obstruction. LFTs were normal Ongoing vomiting in the ED with right upper quadrant pain. IV antiemetics and IV analgesics as needed Obtain twelve-lead EKG to assess QTC, as some IV antiemetics could cause QTC prolongation.   IV fluid hydration Clear liquid diet when able to tolerate p.o. Replete electrolytes as indicated Obtain UDS, history of THC use, suspect a component of hyperemesis cannabinoid syndrome.  Elevated BP BP is currently not at goal, elevated Not on oral antihypertensive prior to admission. Closely monitor vital signs IV Lopressor as needed with parameters.  Hypomagnesemia Serum magnesium 1.6 Repleted intravenously  Tachycardia Obtain twelve-lead EKG Start IV fluid hydration. Monitor on telemetry  Type 2 diabetes with hyperglycemia Obtain Hemoglobin A1c Insulin sliding scale.  Polysubstance abuse including  marijuana Obtain UDS  Severe morbid obesity BMI 52 Recommend weight loss outpatient with regular physical activity and healthy dieting.   DVT prophylaxis: Subcu Lovenox daily  Code Status: Full code  Family Communication: None at bedside  Disposition Plan: Admit to telemetry medical unit  Consults called: None.  Admission status: Inpatient status.  Patient will require at least 2 midnights for further evaluation and treatment of present condition.   Status is: Inpatient    Dispo:  Patient From: Home  Planned Disposition: Home, possibly on 02/13/2021 or when symptomatology has improved.  Medically stable for discharge: No          Kayleen Memos MD [facility] Pager [phone]  If 7PM-7AM, please contact night-coverage [email or web address] Password [facility]  02/11/2021, 4:16 AM

## 2021-02-11 NOTE — ED Notes (Signed)
This RN attempted to call report to 50M.

## 2021-02-11 NOTE — Progress Notes (Signed)
   02/11/21 1759  Assess: MEWS Score  Temp (!) 102.9 F (39.4 C)  BP (!) 179/76  Pulse Rate 97  Resp 19  SpO2 94 %  O2 Device Room Air  Assess: MEWS Score  MEWS Temp 2  MEWS Systolic 0  MEWS Pulse 0  MEWS RR 0  MEWS LOC 0  MEWS Score 2  MEWS Score Color Yellow  Assess: if the MEWS score is Yellow or Red  Were vital signs taken at a resting state? Yes  Focused Assessment No change from prior assessment  Early Detection of Sepsis Score *See Row Information* Medium  MEWS guidelines implemented *See Row Information* No, previously yellow, continue vital signs every 4 hours  Treat  Pain Scale 0-10  Pain Score 0  Take Vital Signs  Increase Vital Sign Frequency  Yellow: Q 2hr X 2 then Q 4hr X 2, if remains yellow, continue Q 4hrs  Notify: Provider  Provider Name/Title Dr Alvino Chapel  Date Provider Notified 02/11/21  Time Provider Notified [phone]  Notification Type Page  Notification Reason Critical result (Fever 102.9. No Tylenol order.)  Provider response See new orders (Ice packs applied.)  Date of Provider Response 02/11/21  Time of Provider Response 1810

## 2021-02-11 NOTE — Progress Notes (Signed)
PHARMACY - PHYSICIAN COMMUNICATION CRITICAL VALUE ALERT - BLOOD CULTURE IDENTIFICATION (BCID)  Nicole [name] is an 45 y.o. female who presented to [facility] on 02/10/2021 with a chief complaint of RUQ abdominal pain, N/V/D.  Assessment:  Found to have acute sigmoid diverticulitis. WBC 17.8, LA 3, Tmax 100.9. Bcx 3/4 growing GNR - BCID showing E Coli (no resistance).  Name of physician (or Provider) Contacted: Dr. Rachael Darby   Current antibiotics: Ceftriaxone and metronidazole  Changes to prescribed antibiotics recommended:  Continue ceftriaxone.   Results for orders placed or performed during the hospital encounter of 02/10/21  Blood Culture ID Panel (Reflexed) (Collected: 02/11/2021  3:10 AM)  Result Value Ref Range   Enterococcus faecalis NOT DETECTED NOT DETECTED   Enterococcus Faecium NOT DETECTED NOT DETECTED   Listeria monocytogenes NOT DETECTED NOT DETECTED   Staphylococcus species NOT DETECTED NOT DETECTED   Staphylococcus aureus (BCID) NOT DETECTED NOT DETECTED   Staphylococcus epidermidis NOT DETECTED NOT DETECTED   Staphylococcus lugdunensis NOT DETECTED NOT DETECTED   Streptococcus species NOT DETECTED NOT DETECTED   Streptococcus agalactiae NOT DETECTED NOT DETECTED   Streptococcus pneumoniae NOT DETECTED NOT DETECTED   Streptococcus pyogenes NOT DETECTED NOT DETECTED   A.calcoaceticus-baumannii NOT DETECTED NOT DETECTED   Bacteroides fragilis NOT DETECTED NOT DETECTED   Enterobacterales DETECTED (A) NOT DETECTED   Enterobacter cloacae complex NOT DETECTED NOT DETECTED   Escherichia coli DETECTED (A) NOT DETECTED   Klebsiella aerogenes NOT DETECTED NOT DETECTED   Klebsiella oxytoca NOT DETECTED NOT DETECTED   Klebsiella pneumoniae NOT DETECTED NOT DETECTED   Proteus species NOT DETECTED NOT DETECTED   Salmonella species NOT DETECTED NOT DETECTED   Serratia marcescens NOT DETECTED NOT DETECTED   Haemophilus influenzae NOT DETECTED NOT DETECTED   Neisseria  meningitidis NOT DETECTED NOT DETECTED   Pseudomonas aeruginosa NOT DETECTED NOT DETECTED   Stenotrophomonas maltophilia NOT DETECTED NOT DETECTED   Candida albicans NOT DETECTED NOT DETECTED   Candida auris NOT DETECTED NOT DETECTED   Candida glabrata NOT DETECTED NOT DETECTED   Candida krusei NOT DETECTED NOT DETECTED   Candida parapsilosis NOT DETECTED NOT DETECTED   Candida tropicalis NOT DETECTED NOT DETECTED   Cryptococcus neoformans/gattii NOT DETECTED NOT DETECTED   CTX-M ESBL NOT DETECTED NOT DETECTED   Carbapenem resistance IMP NOT DETECTED NOT DETECTED   Carbapenem resistance KPC NOT DETECTED NOT DETECTED   Carbapenem resistance NDM NOT DETECTED NOT DETECTED   Carbapenem resist OXA 48 LIKE NOT DETECTED NOT DETECTED   Carbapenem resistance VIM NOT DETECTED NOT DETECTED    Sherron Monday, PharmD, BCCCP Clinical Pharmacist  Phone: [phone] 02/11/2021 9:05 PM  Please check AMION for all [facility] Pharmacy phone numbers After 10:00 PM, call Main Pharmacy [phone]

## 2021-02-12 ENCOUNTER — Encounter (HOSPITAL_COMMUNITY): Payer: Self-pay | Admitting: Internal Medicine

## 2021-02-12 ENCOUNTER — Inpatient Hospital Stay (HOSPITAL_COMMUNITY): Payer: Medicaid Other

## 2021-02-12 LAB — CBC
HCT: 39.5 % (ref 36.0–46.0)
Hemoglobin: 12.6 g/dL (ref 12.0–15.0)
MCH: 26.9 pg (ref 26.0–34.0)
MCHC: 31.9 g/dL (ref 30.0–36.0)
MCV: 84.4 fL (ref 80.0–100.0)
Platelets: 164 10*3/uL (ref 150–400)
RBC: 4.68 MIL/uL (ref 3.87–5.11)
RDW: 14 % (ref 11.5–15.5)
WBC: 14.6 10*3/uL — ABNORMAL HIGH (ref 4.0–10.5)
nRBC: 0 % (ref 0.0–0.2)

## 2021-02-12 LAB — BASIC METABOLIC PANEL
Anion gap: 7 (ref 5–15)
BUN: 8 mg/dL (ref 6–20)
CO2: 28 mmol/L (ref 22–32)
Calcium: 9 mg/dL (ref 8.9–10.3)
Chloride: 98 mmol/L (ref 98–111)
Creatinine, Ser: 1.14 mg/dL — ABNORMAL HIGH (ref 0.44–1.00)
GFR, Estimated: >60 mL/min (ref >60)
Glucose, Bld: 225 mg/dL — ABNORMAL HIGH (ref 70–99)
Potassium: 4.2 mmol/L (ref 3.5–5.1)
Sodium: 133 mmol/L — ABNORMAL LOW (ref 135–145)

## 2021-02-12 LAB — MAGNESIUM: Magnesium: 2 mg/dL (ref 1.7–2.4)

## 2021-02-12 LAB — GLUCOSE, CAPILLARY
Glucose-Capillary: 252 mg/dL — ABNORMAL HIGH (ref 70–99)
Glucose-Capillary: 242 mg/dL — ABNORMAL HIGH (ref 70–99)
Glucose-Capillary: 201 mg/dL — ABNORMAL HIGH (ref 70–99)
Glucose-Capillary: 248 mg/dL — ABNORMAL HIGH (ref 70–99)

## 2021-02-12 LAB — TROPONIN I (HIGH SENSITIVITY): Troponin I (High Sensitivity): 268 ng/L (ref <18)

## 2021-02-12 MED ORDER — IBUPROFEN 400 MG PO TABS: 800.0000 mg | ORAL_TABLET | Freq: Four times a day (QID) | ORAL | Status: DC | PRN

## 2021-02-12 MED ORDER — HEPARIN BOLUS VIA INFUSION
4000.0000 [IU] | Freq: Once | INTRAVENOUS | Status: AC
  Administered 2021-02-13: 4000 [IU] via INTRAVENOUS
  Filled 2021-02-12: qty 4000

## 2021-02-12 MED ORDER — HEPARIN (PORCINE) 25000 UT/250ML-% IV SOLN
2000.0000 [IU]/h | INTRAVENOUS | Status: DC
  Administered 2021-02-13: 1200 [IU]/h via INTRAVENOUS
  Administered 2021-02-13 (×2): 1500 [IU]/h via INTRAVENOUS
  Filled 2021-02-12 (×5): qty 250

## 2021-02-12 MED ORDER — LORAZEPAM 2 MG/ML IJ SOLN
1.0000 mg | Freq: Once | INTRAMUSCULAR | Status: AC
  Administered 2021-02-12: 1 mg via INTRAVENOUS
  Filled 2021-02-12: qty 1

## 2021-02-12 NOTE — Progress Notes (Signed)
PROGRESS NOTE    Nicole [name]  DQQ:[id number] DOB: January 02, 1976 DOA: 02/10/2021 PCP: Hoy Register, MD     Brief Narrative:  Nicole [name] is a 45 year old female with past medical history significant for diabetes who presented with complaints of right upper quadrant abdominal pain.  Work-up in the emergency department revealed acute sigmoid diverticulitis, gallbladder appeared unremarkable without intra or extrahepatic duct dilation.  Patient was started on Rocephin and IV Flagyl.  New events last 24 hours / Subjective: Blood culture showing bacteremia with E. coli.  Patient states that her right upper quadrant abdominal pain has now moved to the left lower quadrant.  Continues to feel ill, nausea and vomiting.  Had fevers as high as 102.9 yesterday.  Assessment & Plan:   Active Problems:   Sigmoid diverticulitis  Sepsis secondary to sigmoid diverticulitis with E. coli bacteremia -Sepsis present on admission with tachycardia, tachypnea and leukocytosis present -Lactic acid 2.7 --> 3 -Await final blood culture result and sensitivities -Remains on Rocephin, Flagyl -Continue IV fluid -WBC is improving -She will eventually need colonoscopy as an outpatient, never had one before  Uncontrolled diabetes with hyperglycemia -Hemoglobin A1c 10 -Hold home Amaryl -Remains on sliding scale insulin   DVT prophylaxis:  SCDs Start: 02/11/21 0324  Code Status:     Code Status Orders  (From admission, onward)           Start     Ordered   02/11/21 0325  Full code  Continuous        02/11/21 0324           Code Status History     Date Active Date Inactive Code Status Order ID Comments User Context   03/25/2014 1634 03/26/2014 1321 Full Code [id number]  Ashok Norris, NP ED      Family Communication: none at bedside Disposition Plan:  Status is: Inpatient  Remains inpatient appropriate because:IV treatments appropriate due to intensity of illness or inability to take  PO  Dispo:  Patient From: Home  Planned Disposition: Home  Medically stable for discharge: No        Antimicrobials:  Anti-infectives (From admission, onward)    Start     Dose/Rate Route Frequency Ordered Stop   02/12/21 0600  cefTRIAXone (ROCEPHIN) 2 g in sodium chloride 0.9 % 100 mL IVPB        2 g 200 mL/hr over 30 Minutes Intravenous Daily 02/11/21 0326     02/11/21 1400  metroNIDAZOLE (FLAGYL) IVPB 500 mg        500 mg 100 mL/hr over 60 Minutes Intravenous Every 8 hours 02/11/21 0326     02/11/21 0300  cefTRIAXone (ROCEPHIN) 2 g in sodium chloride 0.9 % 100 mL IVPB       See Hyperspace for full Linked Orders Report.   2 g 200 mL/hr over 30 Minutes Intravenous  Once 02/11/21 0248 02/11/21 0400   02/11/21 0300  metroNIDAZOLE (FLAGYL) IVPB 500 mg       See Hyperspace for full Linked Orders Report.   500 mg 100 mL/hr over 60 Minutes Intravenous  Once 02/11/21 0248 02/11/21 0501        Objective: Vitals:   02/12/21 0350 02/12/21 0648 02/12/21 0947 02/12/21 1044  BP:  111/66 (!) 147/80 (!) 142/82  Pulse:  88 93 90  Resp:  19 18 (!) 21  Temp: 99.7 F (37.6 C) 98.2 F (36.8 C) 99.3 F (37.4 C) 98.4 F (36.9 C)  TempSrc:  Oral Oral  Oral  SpO2:  95% 92% 93%  Weight:      Height:        Intake/Output Summary (Last 24 hours) at 02/12/2021 1120 Last data filed at 02/12/2021 0600 Gross per 24 hour  Intake 4172.01 ml  Output 525 ml  Net 3647.01 ml   Filed Weights   02/10/21 1840  Weight: (!) 156.8 kg    Examination:  General exam: Appears calm, miserable appearing Respiratory system: Clear to auscultation. Respiratory effort normal. No respiratory distress. No conversational dyspnea.  Cardiovascular system: S1 & S2 heard, RRR. No murmurs. No pedal edema. Gastrointestinal system: Abdomen is nondistended, soft and tender to palpation mid abdomen and left lower quadrant Central nervous system: Alert and oriented. No focal neurological deficits. Speech clear.   Extremities: Symmetric in appearance  Skin: No rashes, lesions or ulcers on exposed skin  Psychiatry: Judgement and insight appear normal. Mood & affect appropriate.   Data Reviewed: I have personally reviewed following labs and imaging studies  CBC: Recent Labs  Lab 02/10/21 1845 02/11/21 0353 02/12/21 0347  WBC 13.8* 17.8* 14.6*  NEUTROABS 11.8*  --   --   HGB 13.0 13.6 12.6  HCT 40.7 42.3 39.5  MCV 84.3 84.1 84.4  PLT 210 196 164   Basic Metabolic Panel: Recent Labs  Lab 02/10/21 1845 02/11/21 0353 02/12/21 0347  NA 136  --  133*  K 4.2  --  4.2  CL 104  --  98  CO2 23  --  28  GLUCOSE 227*  --  225*  BUN 15  --  8  CREATININE 1.08* 1.01* 1.14*  CALCIUM 9.4  --  9.0  MG  --  1.6* 2.0  PHOS  --  3.0  --    GFR: Estimated Creatinine Clearance: 98.4 mL/min (A) (by C-G formula based on SCr of 1.14 mg/dL (H)). Liver Function Tests: Recent Labs  Lab 02/10/21 1845  AST 40  ALT 40  ALKPHOS 103  BILITOT 0.7  PROT 6.9  ALBUMIN 3.3*   Recent Labs  Lab 02/10/21 1845  LIPASE 31   No results for input(s): AMMONIA in the last 168 hours. Coagulation Profile: No results for input(s): INR, PROTIME in the last 168 hours. Cardiac Enzymes: No results for input(s): CKTOTAL, CKMB, CKMBINDEX, TROPONINI in the last 168 hours. BNP (last 3 results) No results for input(s): PROBNP in the last 8760 hours. HbA1C: Recent Labs    02/11/21 0353  HGBA1C 10.0*   CBG: Recent Labs  Lab 02/11/21 1353 02/11/21 1640 02/11/21 2118 02/11/21 2307 02/12/21 0647  GLUCAP 279* 223* 203* 194* 252*   Lipid Profile: No results for input(s): CHOL, HDL, LDLCALC, TRIG, CHOLHDL, LDLDIRECT in the last 72 hours. Thyroid Function Tests: No results for input(s): TSH, T4TOTAL, FREET4, T3FREE, THYROIDAB in the last 72 hours. Anemia Panel: No results for input(s): VITAMINB12, FOLATE, FERRITIN, TIBC, IRON, RETICCTPCT in the last 72 hours. Sepsis Labs: Recent Labs  Lab 02/11/21 0310  02/11/21 0353  LATICACIDVEN 2.7* 3.0*    Recent Results (from the past 240 hour(s))  Resp Panel by RT-PCR (Flu A&B, Covid) Nasopharyngeal Swab     Status: None   Collection Time: 02/10/21  8:48 PM   Specimen: Nasopharyngeal Swab; Nasopharyngeal(NP) swabs in vial transport medium  Result Value Ref Range Status   SARS Coronavirus 2 by RT PCR NEGATIVE NEGATIVE Final    Comment: (NOTE) SARS-CoV-2 target nucleic acids are NOT DETECTED.  The SARS-CoV-2 RNA is generally detectable in upper respiratory  specimens during the acute phase of infection. The lowest concentration of SARS-CoV-2 viral copies this assay can detect is 138 copies/mL. A negative result does not preclude SARS-Cov-2 infection and should not be used as the sole basis for treatment or other patient management decisions. A negative result may occur with  improper specimen collection/handling, submission of specimen other than nasopharyngeal swab, presence of viral mutation(s) within the areas targeted by this assay, and inadequate number of viral copies(<138 copies/mL). A negative result must be combined with clinical observations, patient history, and epidemiological information. The expected result is Negative.  Fact Sheet for Patients:  BloggerCourse.com  Fact Sheet for Healthcare Providers:  SeriousBroker.it  This test is no t yet approved or cleared by the [place] FDA and  has been authorized for detection and/or diagnosis of SARS-CoV-2 by FDA under an Emergency Use Authorization (EUA). This EUA will remain  in effect (meaning this test can be used) for the duration of the COVID-19 declaration under Section 564(b)(1) of the Act, 21 U.S.C.section 360bbb-3(b)(1), unless the authorization is terminated  or revoked sooner.       Influenza A by PCR NEGATIVE NEGATIVE Final   Influenza B by PCR NEGATIVE NEGATIVE Final    Comment: (NOTE) The Xpert Xpress  SARS-CoV-2/FLU/RSV plus assay is intended as an aid in the diagnosis of influenza from Nasopharyngeal swab specimens and should not be used as a sole basis for treatment. Nasal washings and aspirates are unacceptable for Xpert Xpress SARS-CoV-2/FLU/RSV testing.  Fact Sheet for Patients: BloggerCourse.com  Fact Sheet for Healthcare Providers: SeriousBroker.it  This test is not yet approved or cleared by the [place] FDA and has been authorized for detection and/or diagnosis of SARS-CoV-2 by FDA under an Emergency Use Authorization (EUA). This EUA will remain in effect (meaning this test can be used) for the duration of the COVID-19 declaration under Section 564(b)(1) of the Act, 21 U.S.C. section 360bbb-3(b)(1), unless the authorization is terminated or revoked.  Performed at [facility], [address], [place], [place] [address]   Blood culture (routine x 2)     Status: Abnormal (Preliminary result)   Collection Time: 02/11/21  3:10 AM   Specimen: BLOOD  Result Value Ref Range Status   Specimen Description BLOOD SITE NOT SPECIFIED  Final   Special Requests   Final    BOTTLES DRAWN AEROBIC AND ANAEROBIC Blood Culture adequate volume   Culture  Setup Time (A)  Final    GRAM VARIABLE ROD IN BOTH AEROBIC AND ANAEROBIC BOTTLES CRITICAL RESULT CALLED TO, READ BACK BY AND VERIFIED WITH: PHARM D K.HURTH ON [id number] AT 2022 BY E.PARRISH    Culture (A)  Final    ESCHERICHIA COLI SUSCEPTIBILITIES TO FOLLOW Performed at [facility], [address], [place], [place] [address]    Report Status PENDING  Incomplete  Blood Culture ID Panel (Reflexed)     Status: Abnormal   Collection Time: 02/11/21  3:10 AM  Result Value Ref Range Status   Enterococcus faecalis NOT DETECTED NOT DETECTED Final   Enterococcus Faecium NOT DETECTED NOT DETECTED Final   Listeria monocytogenes NOT DETECTED NOT DETECTED Final   Staphylococcus  species NOT DETECTED NOT DETECTED Final   Staphylococcus aureus (BCID) NOT DETECTED NOT DETECTED Final   Staphylococcus epidermidis NOT DETECTED NOT DETECTED Final   Staphylococcus lugdunensis NOT DETECTED NOT DETECTED Final   Streptococcus species NOT DETECTED NOT DETECTED Final   Streptococcus agalactiae NOT DETECTED NOT DETECTED Final  Streptococcus pneumoniae NOT DETECTED NOT DETECTED Final   Streptococcus pyogenes NOT DETECTED NOT DETECTED Final   A.calcoaceticus-baumannii NOT DETECTED NOT DETECTED Final   Bacteroides fragilis NOT DETECTED NOT DETECTED Final   Enterobacterales DETECTED (A) NOT DETECTED Final    Comment: Enterobacterales represent a large order of gram negative bacteria, not a single organism. CRITICAL RESULT CALLED TO, READ BACK BY AND VERIFIED WITH: PHARM D K.HURTH ON [id number] AT 2022 BY E.PARRISH    Enterobacter cloacae complex NOT DETECTED NOT DETECTED Final   Escherichia coli DETECTED (A) NOT DETECTED Final    Comment: CRITICAL RESULT CALLED TO, READ BACK BY AND VERIFIED WITH: PHARM D K.HURTH ON [id number] AT 2022 BY E.PARRISH    Klebsiella aerogenes NOT DETECTED NOT DETECTED Final   Klebsiella oxytoca NOT DETECTED NOT DETECTED Final   Klebsiella pneumoniae NOT DETECTED NOT DETECTED Final   Proteus species NOT DETECTED NOT DETECTED Final   Salmonella species NOT DETECTED NOT DETECTED Final   Serratia marcescens NOT DETECTED NOT DETECTED Final   Haemophilus influenzae NOT DETECTED NOT DETECTED Final   Neisseria meningitidis NOT DETECTED NOT DETECTED Final   Pseudomonas aeruginosa NOT DETECTED NOT DETECTED Final   Stenotrophomonas maltophilia NOT DETECTED NOT DETECTED Final   Candida albicans NOT DETECTED NOT DETECTED Final   Candida auris NOT DETECTED NOT DETECTED Final   Candida glabrata NOT DETECTED NOT DETECTED Final   Candida krusei NOT DETECTED NOT DETECTED Final   Candida parapsilosis NOT DETECTED NOT DETECTED Final   Candida tropicalis NOT DETECTED  NOT DETECTED Final   Cryptococcus neoformans/gattii NOT DETECTED NOT DETECTED Final   CTX-M ESBL NOT DETECTED NOT DETECTED Final   Carbapenem resistance IMP NOT DETECTED NOT DETECTED Final   Carbapenem resistance KPC NOT DETECTED NOT DETECTED Final   Carbapenem resistance NDM NOT DETECTED NOT DETECTED Final   Carbapenem resist OXA 48 LIKE NOT DETECTED NOT DETECTED Final   Carbapenem resistance VIM NOT DETECTED NOT DETECTED Final    Comment: Performed at [facility], [address] [place], [place] [address]  Blood culture (routine x 2)     Status: Abnormal (Preliminary result)   Collection Time: 02/11/21  3:20 AM   Specimen: BLOOD  Result Value Ref Range Status   Specimen Description BLOOD SITE NOT SPECIFIED  Final   Special Requests   Final    BOTTLES DRAWN AEROBIC AND ANAEROBIC Blood Culture adequate volume   Culture  Setup Time   Final    GRAM NEGATIVE RODS IN BOTH AEROBIC AND ANAEROBIC BOTTLES CRITICAL VALUE NOTED.  VALUE IS CONSISTENT WITH PREVIOUSLY REPORTED AND CALLED VALUE. Performed at [facility], [address], [place], [place] [address]    Culture ESCHERICHIA COLI (A)  Final   Report Status PENDING  Incomplete      Radiology Studies: CT Abdomen Pelvis W Contrast  Result Date: 02/11/2021 CLINICAL DATA:  Acute abdominal pain EXAM: CT ABDOMEN AND PELVIS WITH CONTRAST TECHNIQUE: Multidetector CT imaging of the abdomen and pelvis was performed using the standard protocol following bolus administration of intravenous contrast. CONTRAST:  OMNIPAQUE IOHEXOL 300 MG/ML  SOLN COMPARISON:  Right upper quadrant ultrasound dated 02/10/2021. CT abdomen/pelvis dated 03/25/2014. FINDINGS: Lower chest: Lung bases are clear. Hepatobiliary: Liver is within normal limits. Gallbladder is unremarkable. No intrahepatic or extrahepatic ductal dilatation. Pancreas: Within normal limits. Spleen: Within normal limits. Adrenals/Urinary Tract: Adrenal glands are within normal  limits. Kidneys are within normal limits.  No hydronephrosis. Bladder is within normal limits.  Stomach/Bowel: Stomach is within normal limits. No evidence of bowel obstruction. Prior appendectomy. Sigmoid diverticulosis with mild pericolonic inflammatory changes in the left lower quadrant (coronal image 40), suggesting mild sigmoid diverticulitis. No drainable fluid collection/abscess. No free air to suggest macroscopic perforation. Vascular/Lymphatic: No evidence of abdominal aortic aneurysm. Atherosclerotic calcifications of the abdominal aorta and branch vessels. No suspicious abdominopelvic lymphadenopathy. Reproductive: Heterogeneous uterus, suggesting uterine fibroids. 2.0 cm fat density left ovarian lesion (series 3/image 69), suggesting a left ovarian dermoid, grossly unchanged. No right adnexal mass. Other: No abdominopelvic ascites. Musculoskeletal: Degenerative changes of the visualized thoracolumbar spine. IMPRESSION: Mild sigmoid diverticulitis. No drainable fluid collection/abscess. No free air. 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 Electronically Signed   By: Charline Bills M.D.   On: 02/11/2021 01:21   US Abdomen Limited RUQ (LIVER/GB)  Result Date: 02/10/2021 CLINICAL DATA:  Nausea, vomiting, abdominal pain EXAM: ULTRASOUND ABDOMEN LIMITED RIGHT UPPER QUADRANT COMPARISON:  11/14/2016 FINDINGS: Gallbladder: No gallstones or wall thickening visualized. No sonographic Murphy sign noted by sonographer. Common bile duct: Diameter: 6 mm in proximal diameter Liver: The hepatic parenchymal echogenicity is mildly, diffusely increased, similar to that noted on prior examination, in keeping with changes of mild hepatic steatosis. No focal intrahepatic lesions are identified. No intrahepatic biliary ductal dilation. Portal vein is patent on color Doppler imaging with normal direction of blood flow towards the liver. Other: None. IMPRESSION: Stable mild hepatic steatosis. Electronically Signed    By: Helyn Numbers MD   On: 02/10/2021 23:15      Scheduled Meds:  enoxaparin (LOVENOX) injection  75 mg Subcutaneous Daily   insulin aspart  0-5 Units Subcutaneous QHS   insulin aspart  0-9 Units Subcutaneous TID WC   senna  1 tablet Oral Daily   Continuous Infusions:  cefTRIAXone (ROCEPHIN)  IV 2 g (02/12/21 0728)   lactated ringers Stopped (02/12/21 0722)   metronidazole 500 mg (02/12/21 0552)   promethazine (PHENERGAN) injection (IM or IVPB)       LOS: 1 day      Time spent: 20 minutes   Noralee Stain, DO [facility] 02/12/2021, 11:20 AM   Available via Epic secure chat 7am-7pm After these hours, please refer to coverage provider listed on [email or web address]

## 2021-02-12 NOTE — Progress Notes (Signed)
Spoke with Dr. Alvino Chapel and decided to discontinue Flagyl. Continuing with ceftriaxone only for this patient's E. coli bacteremia.   Georgeann Oppenheim, PharmD Pharmacy Resident 02/12/2021, 1:32 PM

## 2021-02-12 NOTE — Progress Notes (Signed)
Pt with episode of Right sided chest pain earlier with SOB. Hx of DMT2. Admitted with sigmoid diverticulitis.  BP stable. Chest pain was rated as 8/10 earlier. Given dilaudid and chest pain resolved now.  Obtained troponin which came  back elevated at 268.  Will cycle troponin. Start on heparin for ACS. Consult cardiology

## 2021-02-12 NOTE — Progress Notes (Signed)
ANTICOAGULATION CONSULT NOTE - Initial Consult  Pharmacy Consult for heparin Indication: chest pain/ACS  No Known Allergies  Patient Measurements: Height: 5\' 8"  (172.7 cm) Weight: (!) 156.8 kg (345 lb 10.9 oz) IBW/kg (Calculated) : 63.9 Heparin Dosing Weight: 103 kg   Vital Signs: Temp: 98.5 F (36.9 C) (07/17 2243) Temp Source: Oral (07/17 2243) BP: 174/97 (07/17 2243) Pulse Rate: 74 (07/17 2243)  Labs: Recent Labs    02/10/21 1845 02/11/21 0353 02/12/21 0347 02/12/21 2103  HGB 13.0 13.6 12.6  --   HCT 40.7 42.3 39.5  --   PLT 210 196 164  --   CREATININE 1.08* 1.01* 1.14*  --   TROPONINIHS  --   --   --  268*    Estimated Creatinine Clearance: 98.4 mL/min (A) (by C-G formula based on SCr of 1.14 mg/dL (H)).   Medical History: Past Medical History:  Diagnosis Date   Arthritis    knees   Hypertension    Ovarian cyst    Pregnancy induced hypertension     Medications:  Scheduled:   enoxaparin (LOVENOX) injection  75 mg Subcutaneous Daily   insulin aspart  0-5 Units Subcutaneous QHS   insulin aspart  0-9 Units Subcutaneous TID WC   senna  1 tablet Oral Daily    Assessment: 45 yof  presenting with RUQ pain found to have acute sigmoid diverticulitis - no AC PTA. Found to have chest pain tonight - MD wanting to start heparin for ACS.   Received enoxaparin 75 mg on 7/17@0948 . Trop 268. Hgb 12.6, plt 164.  Goal of Therapy:  Heparin level 0.3-0.7 units/ml Monitor platelets by anticoagulation protocol: Yes   Plan:  Order heparin bolus 4000 units  Order heparin infusion at 1200 units/hr  Order heparin level in 6 hours  Monitor daily HL, CBC, and for s/sx of bleeding   8/17, PharmD, BCCCP Clinical Pharmacist  Phone: [phone] 02/12/2021 10:53 PM  Please check AMION for all [facility] Pharmacy phone numbers After 10:00 PM, call Main Pharmacy [phone]

## 2021-02-13 ENCOUNTER — Inpatient Hospital Stay (HOSPITAL_COMMUNITY): Payer: Medicaid Other

## 2021-02-13 DIAGNOSIS — R778 Other specified abnormalities of plasma proteins: Secondary | ICD-10-CM | POA: Diagnosis not present

## 2021-02-13 DIAGNOSIS — R7989 Other specified abnormal findings of blood chemistry: Secondary | ICD-10-CM

## 2021-02-13 DIAGNOSIS — R079 Chest pain, unspecified: Secondary | ICD-10-CM | POA: Diagnosis not present

## 2021-02-13 LAB — GLUCOSE, CAPILLARY
Glucose-Capillary: 288 mg/dL — ABNORMAL HIGH (ref 70–99)
Glucose-Capillary: 240 mg/dL — ABNORMAL HIGH (ref 70–99)
Glucose-Capillary: 188 mg/dL — ABNORMAL HIGH (ref 70–99)
Glucose-Capillary: 186 mg/dL — ABNORMAL HIGH (ref 70–99)

## 2021-02-13 LAB — BASIC METABOLIC PANEL
Anion gap: 10 (ref 5–15)
BUN: 7 mg/dL (ref 6–20)
CO2: 25 mmol/L (ref 22–32)
Calcium: 8.5 mg/dL — ABNORMAL LOW (ref 8.9–10.3)
Chloride: 98 mmol/L (ref 98–111)
Creatinine, Ser: 0.94 mg/dL (ref 0.44–1.00)
GFR, Estimated: >60 mL/min (ref >60)
Glucose, Bld: 222 mg/dL — ABNORMAL HIGH (ref 70–99)
Potassium: 3.5 mmol/L (ref 3.5–5.1)
Sodium: 133 mmol/L — ABNORMAL LOW (ref 135–145)

## 2021-02-13 LAB — CBC
HCT: 37.7 % (ref 36.0–46.0)
Hemoglobin: 11.8 g/dL — ABNORMAL LOW (ref 12.0–15.0)
MCH: 26.5 pg (ref 26.0–34.0)
MCHC: 31.3 g/dL (ref 30.0–36.0)
MCV: 84.7 fL (ref 80.0–100.0)
Platelets: 132 10*3/uL — ABNORMAL LOW (ref 150–400)
RBC: 4.45 MIL/uL (ref 3.87–5.11)
RDW: 13.9 % (ref 11.5–15.5)
WBC: 9.8 10*3/uL (ref 4.0–10.5)
nRBC: 0 % (ref 0.0–0.2)

## 2021-02-13 LAB — HEPARIN LEVEL (UNFRACTIONATED)
Heparin Unfractionated: 0.14 IU/mL — ABNORMAL LOW (ref 0.30–0.70)
Heparin Unfractionated: 0.13 IU/mL — ABNORMAL LOW (ref 0.30–0.70)

## 2021-02-13 LAB — LIPID PANEL
Cholesterol: 163 mg/dL (ref 0–200)
HDL: 32 mg/dL — ABNORMAL LOW (ref >40)
LDL Cholesterol: 105 mg/dL — ABNORMAL HIGH (ref 0–99)
Total CHOL/HDL Ratio: 5.1 RATIO
Triglycerides: 129 mg/dL (ref <150)
VLDL: 26 mg/dL (ref 0–40)

## 2021-02-13 LAB — ECHOCARDIOGRAM COMPLETE
AR max vel: 3.69 cm2
AV Area VTI: 3.53 cm2
AV Area mean vel: 3.72 cm2
AV Mean grad: 6 mmHg
AV Peak grad: 12.5 mmHg
Ao pk vel: 1.77 m/s
Area-P 1/2: 3.08 cm2
Calc EF: 41.6 %
Height: 68 in
S' Lateral: 3.3 cm
Single Plane A2C EF: 54.1 %
Single Plane A4C EF: 26.2 %
Weight: 5530.9 oz

## 2021-02-13 LAB — CULTURE, BLOOD (ROUTINE X 2)
Special Requests: ADEQUATE
Special Requests: ADEQUATE

## 2021-02-13 LAB — TROPONIN I (HIGH SENSITIVITY)
Troponin I (High Sensitivity): 278 ng/L (ref <18)
Troponin I (High Sensitivity): 310 ng/L (ref <18)
Troponin I (High Sensitivity): 163 ng/L (ref <18)
Troponin I (High Sensitivity): 140 ng/L (ref <18)

## 2021-02-13 MED ORDER — INSULIN GLARGINE 100 UNIT/ML ~~LOC~~ SOLN
15.0000 [IU] | Freq: Every day | SUBCUTANEOUS | Status: DC
  Administered 2021-02-13 – 2021-02-14 (×2): 15 [IU] via SUBCUTANEOUS
  Filled 2021-02-13 (×2): qty 0.15

## 2021-02-13 MED ORDER — ATORVASTATIN CALCIUM 80 MG PO TABS
80.0000 mg | ORAL_TABLET | Freq: Every day | ORAL | Status: DC
  Administered 2021-02-13 – 2021-02-15 (×3): 80 mg via ORAL
  Filled 2021-02-13 (×3): qty 1

## 2021-02-13 MED ORDER — SODIUM CHLORIDE 0.9 % IV SOLN: INTRAVENOUS | Status: DC

## 2021-02-13 MED ORDER — AZITHROMYCIN 500 MG PO TABS
500.0000 mg | ORAL_TABLET | Freq: Every day | ORAL | Status: DC
  Administered 2021-02-13 – 2021-02-15 (×3): 500 mg via ORAL
  Filled 2021-02-13 (×3): qty 1

## 2021-02-13 MED ORDER — ASPIRIN 81 MG PO CHEW
81.0000 mg | CHEWABLE_TABLET | Freq: Every day | ORAL | Status: DC
  Administered 2021-02-13 – 2021-02-15 (×3): 81 mg via ORAL
  Filled 2021-02-13 (×3): qty 1

## 2021-02-13 MED ORDER — INSULIN ASPART 100 UNIT/ML IJ SOLN
0.0000 [IU] | INTRAMUSCULAR | Status: DC
  Administered 2021-02-13: 5 [IU] via SUBCUTANEOUS

## 2021-02-13 MED ORDER — HEPARIN BOLUS VIA INFUSION
3000.0000 [IU] | Freq: Once | INTRAVENOUS | Status: AC
  Administered 2021-02-13: 3000 [IU] via INTRAVENOUS
  Filled 2021-02-13: qty 3000

## 2021-02-13 MED ORDER — CEFAZOLIN SODIUM-DEXTROSE 2-4 GM/100ML-% IV SOLN
2.0000 g | Freq: Three times a day (TID) | INTRAVENOUS | Status: DC
  Administered 2021-02-14 – 2021-02-15 (×4): 2 g via INTRAVENOUS
  Filled 2021-02-13 (×6): qty 100

## 2021-02-13 MED ORDER — LOSARTAN POTASSIUM 25 MG PO TABS
25.0000 mg | ORAL_TABLET | Freq: Every day | ORAL | Status: DC
  Administered 2021-02-13 – 2021-02-15 (×3): 25 mg via ORAL
  Filled 2021-02-13 (×3): qty 1

## 2021-02-13 MED ORDER — INSULIN ASPART 100 UNIT/ML IJ SOLN
0.0000 [IU] | Freq: Three times a day (TID) | INTRAMUSCULAR | Status: DC
  Administered 2021-02-13: 5 [IU] via SUBCUTANEOUS
  Administered 2021-02-14 (×2): 3 [IU] via SUBCUTANEOUS
  Administered 2021-02-14: 5 [IU] via SUBCUTANEOUS
  Administered 2021-02-15: 3 [IU] via SUBCUTANEOUS
  Administered 2021-02-15: 5 [IU] via SUBCUTANEOUS

## 2021-02-13 MED ORDER — IOHEXOL 350 MG/ML SOLN
100.0000 mL | Freq: Once | INTRAVENOUS | Status: AC | PRN
  Administered 2021-02-13: 100 mL via INTRAVENOUS

## 2021-02-13 MED ORDER — INSULIN ASPART 100 UNIT/ML IJ SOLN
0.0000 [IU] | INTRAMUSCULAR | Status: DC
  Administered 2021-02-13: 4 [IU] via SUBCUTANEOUS

## 2021-02-13 NOTE — Progress Notes (Addendum)
ANTICOAGULATION CONSULT NOTE - Initial Consult  Pharmacy Consult for heparin Indication: chest pain/ACS  No Known Allergies  Patient Measurements: Height: 5\' 8"  (172.7 cm) Weight: (!) 156.8 kg (345 lb 10.9 oz) IBW/kg (Calculated) : 63.9 Heparin Dosing Weight: 103 kg   Vital Signs: Temp: 98 F (36.7 C) (07/18 0837) Temp Source: Oral (07/18 0837) BP: 164/88 (07/18 0837) Pulse Rate: 70 (07/18 0837)  Labs: Recent Labs    02/11/21 0353 02/12/21 0347 02/12/21 2103 02/12/21 2304 02/13/21 0111 02/13/21 0703  HGB 13.6 12.6  --   --  11.8*  --   HCT 42.3 39.5  --   --  37.7  --   PLT 196 164  --   --  132*  --   HEPARINUNFRC  --   --   --   --   --  0.14*  CREATININE 1.01* 1.14*  --   --  0.94  --   TROPONINIHS  --   --  268* 278* 310*  --      Estimated Creatinine Clearance: 119.4 mL/min (by C-G formula based on SCr of 0.94 mg/dL).   Medical History: Past Medical History:  Diagnosis Date   Arthritis    knees   Hypertension    Ovarian cyst    Pregnancy induced hypertension     Medications:  Scheduled:   insulin aspart  0-9 Units Subcutaneous Q4H   senna  1 tablet Oral Daily    Assessment: 45 yo female presenting with RUQ pain found to have acute sigmoid diverticulitis - no AC PTA. Found to have chest pain tonight - MD wanting to start heparin for ACS for 48h.   Heparin level subtherapeutic at 0.14 on 1200 units/hr. CBC stable - Hb 11.8, plt 132. No signs/sx of bleeding. Trop still elevated at 310. Of note, received enoxaparin 75 mg on 7/17 @0948 .   Goal of Therapy:  Heparin level 0.3-0.7 units/ml Monitor platelets by anticoagulation protocol: Yes   Plan:  Give another 3000 unit bolus Increase heparin infusion to 1500 units/hr  Order heparin level in 6 hours  Monitor daily HL, CBC, and for s/sx of bleeding   8/17, PharmD PGY1 Pharmacy Resident 02/13/2021  8:48 AM  Please check [email or web address] for unit-specific pharmacy phone numbers.

## 2021-02-13 NOTE — Progress Notes (Signed)
*  PRELIMINARY RESULTS* Echocardiogram 2D Echocardiogram has been performed.  Neomia Dear RDCS 02/13/2021, 12:35 PM

## 2021-02-13 NOTE — Discharge Instructions (Signed)
Follow up with [facility].

## 2021-02-13 NOTE — Progress Notes (Addendum)
Inpatient Diabetes Program Recommendations  AACE/ADA: New Consensus Statement on Inpatient Glycemic Control (2015)  Target Ranges:  Prepandial:   less than 140 mg/dL      Peak postprandial:   less than 180 mg/dL (1-2 hours)      Critically ill patients:  140 - 180 mg/dL   Lab Results  Component Value Date   GLUCAP 288 (H) 02/13/2021   HGBA1C 10.0 (H) 02/11/2021    Review of Glycemic Control Results for Nicole [name], Nicole [name] (MRN [id number]) as of 02/13/2021 11:24  Ref. Range 02/12/2021 11:28 02/12/2021 17:17 02/12/2021 20:50 02/13/2021 06:38  Glucose-Capillary Latest Ref Range: 70 - 99 mg/dL 242 (H) 201 (H) 248 (H) 288 (H)   Diabetes history: Type 2 DM Outpatient Diabetes medications: Amaryl 1 mg qd Current orders for Inpatient glycemic control: Novolog 0-15 units q4h  Inpatient Diabetes Program Recommendations:   Consider Lantus 15 units qd.  Will plan to see patient.    Addendum: Spoke with patient regarding outpatient diabetes management. Patient admits to not taking Amaryl, checking blood sugars or following up as she should. She states, "I just  was too busy." Reviewed patient's current A1c of 10%. Explained what a A1c is and what it measures. Also reviewed goal A1c with patient, importance of good glucose control @ home, and blood sugar goals. Reviewed patho of DM, need for improved control, survival skills, interventions, vascular changes, and comorbidites. Patient will need a meter at discharge. Blood glucose meter kit (#[id number]). Reviewed recommended frequency and when to call md. Patient is usually followed at [facility] but is requesting another provider. Secure chat sent to [facility] to help coordinate follow up appointment.  Admits to sugary beverages. Discussed alternatives and encourage patient towards CHO mindfulness and start taking better care of her diabetes. No further questions at this time.  Thanks, Bronson Curb, MSN, RNC-OB Diabetes  Coordinator [phone] (8a-5p)

## 2021-02-13 NOTE — Progress Notes (Signed)
ANTICOAGULATION CONSULT NOTE - Initial Consult  Pharmacy Consult for heparin Indication: chest pain/ACS  No Known Allergies  Patient Measurements: Height: 5\' 8"  (172.7 cm) Weight: (!) 156.8 kg (345 lb 10.9 oz) IBW/kg (Calculated) : 63.9 Heparin Dosing Weight: 103 kg   Vital Signs: Temp: 98.5 F (36.9 C) (07/18 1741) Temp Source: Oral (07/18 1741) BP: 146/72 (07/18 1741) Pulse Rate: 70 (07/18 1741)  Labs: Recent Labs    02/11/21 0353 02/12/21 0347 02/12/21 2103 02/12/21 2304 02/13/21 0111 02/13/21 0703 02/13/21 1556  HGB 13.6 12.6  --   --  11.8*  --   --   HCT 42.3 39.5  --   --  37.7  --   --   PLT 196 164  --   --  132*  --   --   HEPARINUNFRC  --   --   --   --   --  0.14* 0.13*  CREATININE 1.01* 1.14*  --   --  0.94  --   --   TROPONINIHS  --   --  268* 278* 310*  --   --      Estimated Creatinine Clearance: 119.4 mL/min (by C-G formula based on SCr of 0.94 mg/dL).   Medical History: Past Medical History:  Diagnosis Date   Arthritis    knees   Hypertension    Ovarian cyst    Pregnancy induced hypertension     Medications:  Scheduled:   aspirin  81 mg Oral Daily   atorvastatin  80 mg Oral Daily   azithromycin  500 mg Oral Daily   heparin  3,000 Units Intravenous Once   insulin aspart  0-15 Units Subcutaneous TID WC   insulin glargine  15 Units Subcutaneous Daily   losartan  25 mg Oral Daily   senna  1 tablet Oral Daily    Assessment: 45 yo female presenting with RUQ pain found to have acute sigmoid diverticulitis - no AC PTA. Found to have chest pain tonight - MD wanting to start heparin for ACS for 48h.   Heparin level subtherapeutic at 0.13 on 1500 units/hr. CBC stable - Hb 11.8, plt 132. No signs/sx of bleeding. Trop still elevated at 310. Of note, received enoxaparin 75 mg on 7/17 @0948 .   Goal of Therapy:  Heparin level 0.3-0.7 units/ml Monitor platelets by anticoagulation protocol: Yes   Plan:  Give another 3000 unit bolus Increase  heparin infusion to 1800 units/hr  Order heparin level in 6 hours  Monitor daily HL, CBC, and for s/sx of bleeding   8/17, PharmD, [facility] Clinical Pharmacist Please see AMION for all Pharmacists' Contact Phone Numbers 02/13/2021, 6:15 PM

## 2021-02-13 NOTE — Progress Notes (Signed)
Progress Note  Patient Name: Nicole [name] Date of Encounter: 02/13/2021  [facility] Cardiologist: New to [facility]; Dr. Flora Lipps  Subjective   Continues to feel SOB this morning. States this started overnight. Right sided chest pain resolved with IV dilaudid. She is somewhat somnolent now. Denies prior trouble with chest pain. Reports being diagnosed with HTN but is not currently on medications. No significant family history of CAD.   Inpatient Medications    Scheduled Meds:  insulin aspart  0-9 Units Subcutaneous Q4H   senna  1 tablet Oral Daily   Continuous Infusions:  sodium chloride     cefTRIAXone (ROCEPHIN)  IV 2 g (02/13/21 0629)   heparin 1,200 Units/hr (02/13/21 0053)   promethazine (PHENERGAN) injection (IM or IVPB)     PRN Meds: acetaminophen, albuterol, HYDROmorphone (DILAUDID) injection, ibuprofen, metoprolol tartrate, ondansetron (ZOFRAN) IV, oxyCODONE, promethazine (PHENERGAN) injection (IM or IVPB)   Vital Signs    Vitals:   02/12/21 2243 02/13/21 0339 02/13/21 0410 02/13/21 0413  BP: (!) 174/97 (!) 155/85    Pulse: 74 94    Resp: (!) 21 (!) 22 (!) 22   Temp: 98.5 F (36.9 C) 98.8 F (37.1 C)    TempSrc: Oral Oral    SpO2: 97% (!) 88% 91% 95%  Weight:      Height:        Intake/Output Summary (Last 24 hours) at 02/13/2021 0828 Last data filed at 02/13/2021 0600 Gross per 24 hour  Intake 1417.64 ml  Output 303 ml  Net 1114.64 ml   Last 3 Weights 02/10/2021 12/16/2020 09/13/2020  Weight (lbs) 345 lb 10.9 oz 345 lb 9.6 oz 353 lb  Weight (kg) 156.8 kg 156.763 kg 160.12 kg      Telemetry    Sinus rhythm with episode of 2nd degree AV block type 1 in the 10pm hour last night - Personally Reviewed  ECG    No new tracings - Personally Reviewed  Physical Exam   GEN: No acute distress.   Neck: No JVD Cardiac: RRR, no murmurs, rubs, or gallops.  Respiratory: mildly SOB. O2 via [place] in place. Clear to auscultation bilaterally. GI: Soft,  obese, nontender, non-distended  MS: trace LE edema; No deformity. Neuro:  Nonfocal  Psych: Normal affect   Labs    High Sensitivity Troponin:   Recent Labs  Lab 02/12/21 2103 02/12/21 2304 02/13/21 0111  TROPONINIHS 268* 278* 310*      Chemistry Recent Labs  Lab 02/10/21 1845 02/11/21 0353 02/12/21 0347 02/13/21 0111  NA 136  --  133* 133*  K 4.2  --  4.2 3.5  CL 104  --  98 98  CO2 23  --  28 25  GLUCOSE 227*  --  225* 222*  BUN 15  --  8 7  CREATININE 1.08* 1.01* 1.14* 0.94  CALCIUM 9.4  --  9.0 8.5*  PROT 6.9  --   --   --   ALBUMIN 3.3*  --   --   --   AST 40  --   --   --   ALT 40  --   --   --   ALKPHOS 103  --   --   --   BILITOT 0.7  --   --   --   GFRNONAA >60 >60 >60 >60  ANIONGAP 9  --  7 10     Hematology Recent Labs  Lab 02/11/21 0353 02/12/21 0347 02/13/21 0111  WBC  17.8* 14.6* 9.8  RBC 5.03 4.68 4.45  HGB 13.6 12.6 11.8*  HCT 42.3 39.5 37.7  MCV 84.1 84.4 84.7  MCH 27.0 26.9 26.5  MCHC 32.2 31.9 31.3  RDW 14.0 14.0 13.9  PLT 196 164 132*    BNPNo results for input(s): BNP, PROBNP in the last 168 hours.   DDimer No results for input(s): DDIMER in the last 168 hours.   Radiology    DG CHEST PORT 1 VIEW  Result Date: 02/12/2021 CLINICAL DATA:  Tachypnea, chest pressure EXAM: PORTABLE CHEST 1 VIEW COMPARISON:  05/11/2020 FINDINGS: Single frontal view of the chest demonstrates mild enlargement the cardiac silhouette, likely accentuated by portable AP positioning. No airspace disease, effusion, or pneumothorax. No acute bony abnormalities. IMPRESSION: 1. No acute intrathoracic process. Electronically Signed   By: Sharlet Salina M.D.   On: 02/12/2021 21:21    Cardiac Studies   Echocardiogram pending  Patient Profile     45 y.o. female with PMH of HTN not on medications, poorly controlled DM type 2, and morbid obesity who is being followed by cardiology for elevated HsTrop.  Assessment & Plan    1. Elevated troponin in patient with  no known CAD: patient admitted with ecoli bacteremia in the setting of diverticulitis. She was septic on admission, febrile to 102.7, with lactate up to 3, WBC 17.8 (improving). She was started on IV antibiotics. Overnight had complaints of right sided chest pain. EKG was non-ischemic with non-specific T wave abnormalities. HsTrop with flat trend: 268>278>310. CP resolved with IV dilaudid. She has no known CAD (not noted on prior CT scans), though has never had any cardiac testing. Risk factors for CAD include DM type 2 and morbid obesity. BP has been persistently elevated this admission and I suspect underlying HTN as well.  - Echocardiogram pending - if any abnormalities with LV function or wall motion, would consider further inpatient ischemic testing; otherwise an outpatient NST may be reasonable.  - Will add on a FLP to AM labs for risk stratification - Symptoms concerning for possible PE given new onset SOB, right sided chest pain, and limited mobility, in addition to elevated HsTrop. Discussed with Dr. Flora Lipps - will check a CTA Chest to r/o PE.    2. HTN: BP persistently elevated this admission. Not on medications prior to arrival.  - Will start losartan 25mg  daily given history of DM type 2.   3. DM type 2: poorly controlled with A1C 10.0 this admission. - Continue management per primary team  4. Sepsis: presented with fever and abdominal pain. Found to have diverticulitis and subsequent Ecoli bacteremia. On IV antibiotics - Continue management per primary team.         For questions or updates, please contact [facility] Please consult [email or web address] for contact info under        Signed, , PA-C  02/13/2021, 8:28 AM

## 2021-02-13 NOTE — Progress Notes (Addendum)
PROGRESS NOTE    MALEEKA SABATINO  ZOX:[id number] DOB: Dec 12, 1975 DOA: 02/10/2021 PCP: Hoy Register, MD     Brief Narrative:  Nicole [name] is a 45 year old female with past medical history significant for diabetes who presented with complaints of right upper quadrant abdominal pain.  Work-up in the emergency department revealed acute sigmoid diverticulitis, gallbladder appeared unremarkable without intra or extrahepatic duct dilation.  Patient was started on Rocephin and IV Flagyl.  Patient was found to have E. coli bacteremia as well.  New events last 24 hours / Subjective: Patient developed right-sided chest pain, shortness of breath yesterday evening, troponin was elevated into the 200s.  Cardiology was consulted. On my exam, patient's CP has resolved. Still having some shortness of breath.  Her abdominal pain has now changed back to the right side.  Has some nausea without any vomiting. Wants to eat fruit.   Assessment & Plan:   Active Problems:   Sigmoid diverticulitis  Sepsis secondary to sigmoid diverticulitis with E. coli bacteremia -Sepsis present on admission with tachycardia, tachypnea and leukocytosis present -Lactic acid 2.7 --> 3 -Blood cultures with pansensitive E. coli, continue Rocephin -Repeat blood cultures drawn today -Leukocytosis resolved -She will eventually need colonoscopy as an outpatient, never had one before  Uncontrolled diabetes with hyperglycemia -Hemoglobin A1c 10 -Hold home Amaryl -Remains on sliding scale insulin, changed to increased dose, and add Lantus  Chest pain with elevated troponin -Troponin 268 --> 278 --> 310 -Echocardiogram pending -CTA chest negative for PE -Remains on IV heparin for now, pending echo -Aspirin, Lipitor  Hypertension -Cozaar   DVT prophylaxis: IV Heparin  SCDs Start: 02/11/21 0324  Code Status:     Code Status Orders  (From admission, onward)           Start     Ordered   02/11/21 0325  Full code   Continuous        02/11/21 0324           Code Status History     Date Active Date Inactive Code Status Order ID Comments User Context   03/25/2014 1634 03/26/2014 1321 Full Code [id number]  Ashok Norris, NP ED      Family Communication: none at bedside, sister on speaker phone during exam  Disposition Plan:  Status is: Inpatient  Remains inpatient appropriate because:IV treatments appropriate due to intensity of illness or inability to take PO  Dispo:  Patient From: Home  Planned Disposition: Home  Medically stable for discharge: No        Antimicrobials:  Anti-infectives (From admission, onward)    Start     Dose/Rate Route Frequency Ordered Stop   02/12/21 0600  cefTRIAXone (ROCEPHIN) 2 g in sodium chloride 0.9 % 100 mL IVPB        2 g 200 mL/hr over 30 Minutes Intravenous Daily 02/11/21 0326     02/11/21 1400  metroNIDAZOLE (FLAGYL) IVPB 500 mg  Status:  Discontinued        500 mg 100 mL/hr over 60 Minutes Intravenous Every 8 hours 02/11/21 0326 02/12/21 1331   02/11/21 0300  cefTRIAXone (ROCEPHIN) 2 g in sodium chloride 0.9 % 100 mL IVPB       See Hyperspace for full Linked Orders Report.   2 g 200 mL/hr over 30 Minutes Intravenous  Once 02/11/21 0248 02/11/21 0400   02/11/21 0300  metroNIDAZOLE (FLAGYL) IVPB 500 mg       See Hyperspace for full Linked Orders Report.  500 mg 100 mL/hr over 60 Minutes Intravenous  Once 02/11/21 0248 02/11/21 0501        Objective: Vitals:   02/13/21 0410 02/13/21 0413 02/13/21 0837 02/13/21 0902  BP:   (!) 164/88 (!) 154/89  Pulse:   70 71  Resp: (!) Temp:   98 F (36.7 C) 98.4 F (36.9 C)  TempSrc:   Oral Oral  SpO2: 91% 95% 98% 100%  Weight:      Height:        Intake/Output Summary (Last 24 hours) at 02/13/2021 1125 Last data filed at 02/13/2021 0900 Gross per 24 hour  Intake 1657.64 ml  Output 553 ml  Net 1104.64 ml    Filed Weights   02/10/21 1840  Weight: (!) 156.8 kg     Examination: General exam: Appears calm  Respiratory system: Clear to auscultation. Respiratory effort normal. On [place] O2 Cardiovascular system: S1 & S2 heard, RRR. No pedal edema. Gastrointestinal system: Abdomen is nondistended, soft and tender to palpation right hemiabdomen Central nervous system: Alert and oriented. Non focal exam. Speech clear  Extremities: Symmetric in appearance bilaterally  Skin: No rashes, lesions or ulcers on exposed skin  Psychiatry: Judgement and insight appear stable. Mood & affect appropriate.    Data Reviewed: I have personally reviewed following labs and imaging studies  CBC: Recent Labs  Lab 02/10/21 1845 02/11/21 0353 02/12/21 0347 02/13/21 0111  WBC 13.8* 17.8* 14.6* 9.8  NEUTROABS 11.8*  --   --   --   HGB 13.0 13.6 12.6 11.8*  HCT 40.7 42.3 39.5 37.7  MCV 84.3 84.1 84.4 84.7  PLT 210 196 164 132*    Basic Metabolic Panel: Recent Labs  Lab 02/10/21 1845 02/11/21 0353 02/12/21 0347 02/13/21 0111  NA 136  --  133* 133*  K 4.2  --  4.2 3.5  CL 104  --  98 98  CO2 23  --  28 25  GLUCOSE 227*  --  225* 222*  BUN 15  --  8 7  CREATININE 1.08* 1.01* 1.14* 0.94  CALCIUM 9.4  --  9.0 8.5*  MG  --  1.6* 2.0  --   PHOS  --  3.0  --   --     GFR: Estimated Creatinine Clearance: 119.4 mL/min (by C-G formula based on SCr of 0.94 mg/dL). Liver Function Tests: Recent Labs  Lab 02/10/21 1845  AST 40  ALT 40  ALKPHOS 103  BILITOT 0.7  PROT 6.9  ALBUMIN 3.3*    Recent Labs  Lab 02/10/21 1845  LIPASE 31    No results for input(s): AMMONIA in the last 168 hours. Coagulation Profile: No results for input(s): INR, PROTIME in the last 168 hours. Cardiac Enzymes: No results for input(s): CKTOTAL, CKMB, CKMBINDEX, TROPONINI in the last 168 hours. BNP (last 3 results) No results for input(s): PROBNP in the last 8760 hours. HbA1C: Recent Labs    02/11/21 0353  HGBA1C 10.0*    CBG: Recent Labs  Lab 02/12/21 0647  02/12/21 1128 02/12/21 1717 02/12/21 2050 02/13/21 0638  GLUCAP 252* 242* 201* 248* 288*    Lipid Profile: Recent Labs    02/13/21 0932  CHOL 163  HDL 32*  LDLCALC 105*  TRIG 129  CHOLHDL 5.1   Thyroid Function Tests: No results for input(s): TSH, T4TOTAL, FREET4, T3FREE, THYROIDAB in the last 72 hours. Anemia Panel: No results for input(s): VITAMINB12, FOLATE, FERRITIN, TIBC, IRON, RETICCTPCT in the last  72 hours. Sepsis Labs: Recent Labs  Lab 02/11/21 0310 02/11/21 0353  LATICACIDVEN 2.7* 3.0*     Recent Results (from the past 240 hour(s))  Resp Panel by RT-PCR (Flu A&B, Covid) Nasopharyngeal Swab     Status: None   Collection Time: 02/10/21  8:48 PM   Specimen: Nasopharyngeal Swab; Nasopharyngeal(NP) swabs in vial transport medium  Result Value Ref Range Status   SARS Coronavirus 2 by RT PCR NEGATIVE NEGATIVE Final    Comment: (NOTE) SARS-CoV-2 target nucleic acids are NOT DETECTED.  The SARS-CoV-2 RNA is generally detectable in upper respiratory specimens during the acute phase of infection. The lowest concentration of SARS-CoV-2 viral copies this assay can detect is 138 copies/mL. A negative result does not preclude SARS-Cov-2 infection and should not be used as the sole basis for treatment or other patient management decisions. A negative result may occur with  improper specimen collection/handling, submission of specimen other than nasopharyngeal swab, presence of viral mutation(s) within the areas targeted by this assay, and inadequate number of viral copies(<138 copies/mL). A negative result must be combined with clinical observations, patient history, and epidemiological information. The expected result is Negative.  Fact Sheet for Patients:  BloggerCourse.com  Fact Sheet for Healthcare Providers:  SeriousBroker.it  This test is no t yet approved or cleared by the [place] FDA and  has been  authorized for detection and/or diagnosis of SARS-CoV-2 by FDA under an Emergency Use Authorization (EUA). This EUA will remain  in effect (meaning this test can be used) for the duration of the COVID-19 declaration under Section 564(b)(1) of the Act, 21 U.S.C.section 360bbb-3(b)(1), unless the authorization is terminated  or revoked sooner.       Influenza A by PCR NEGATIVE NEGATIVE Final   Influenza B by PCR NEGATIVE NEGATIVE Final    Comment: (NOTE) The Xpert Xpress SARS-CoV-2/FLU/RSV plus assay is intended as an aid in the diagnosis of influenza from Nasopharyngeal swab specimens and should not be used as a sole basis for treatment. Nasal washings and aspirates are unacceptable for Xpert Xpress SARS-CoV-2/FLU/RSV testing.  Fact Sheet for Patients: BloggerCourse.com  Fact Sheet for Healthcare Providers: SeriousBroker.it  This test is not yet approved or cleared by the [place] FDA and has been authorized for detection and/or diagnosis of SARS-CoV-2 by FDA under an Emergency Use Authorization (EUA). This EUA will remain in effect (meaning this test can be used) for the duration of the COVID-19 declaration under Section 564(b)(1) of the Act, 21 U.S.C. section 360bbb-3(b)(1), unless the authorization is terminated or revoked.  Performed at [facility], [address] [place], [place] [address]   Blood culture (routine x 2)     Status: Abnormal   Collection Time: 02/11/21  3:10 AM   Specimen: BLOOD  Result Value Ref Range Status   Specimen Description BLOOD SITE NOT SPECIFIED  Final   Special Requests   Final    BOTTLES DRAWN AEROBIC AND ANAEROBIC Blood Culture adequate volume   Culture  Setup Time (A)  Final    GRAM VARIABLE ROD IN BOTH AEROBIC AND ANAEROBIC BOTTLES CRITICAL RESULT CALLED TO, READ BACK BY AND VERIFIED WITH: PHARM D K.[facility] ON [id number] AT 2022 BY E.PARRISH Performed at [facility],  [address], [place], [place] [address]    Culture ESCHERICHIA COLI (A)  Final   Report Status 02/13/2021 FINAL  Final   Organism ID, Bacteria ESCHERICHIA COLI  Final      Susceptibility  Escherichia coli - MIC*    AMPICILLIN <=2 SENSITIVE Sensitive     CEFAZOLIN <=4 SENSITIVE Sensitive     CEFEPIME <=0.12 SENSITIVE Sensitive     CEFTAZIDIME <=1 SENSITIVE Sensitive     CEFTRIAXONE <=0.25 SENSITIVE Sensitive     CIPROFLOXACIN <=0.25 SENSITIVE Sensitive     GENTAMICIN <=1 SENSITIVE Sensitive     IMIPENEM <=0.25 SENSITIVE Sensitive     TRIMETH/SULFA <=20 SENSITIVE Sensitive     AMPICILLIN/SULBACTAM <=2 SENSITIVE Sensitive     PIP/TAZO <=4 SENSITIVE Sensitive     * ESCHERICHIA COLI  Blood Culture ID Panel (Reflexed)     Status: Abnormal   Collection Time: 02/11/21  3:10 AM  Result Value Ref Range Status   Enterococcus faecalis NOT DETECTED NOT DETECTED Final   Enterococcus Faecium NOT DETECTED NOT DETECTED Final   Listeria monocytogenes NOT DETECTED NOT DETECTED Final   Staphylococcus species NOT DETECTED NOT DETECTED Final   Staphylococcus aureus (BCID) NOT DETECTED NOT DETECTED Final   Staphylococcus epidermidis NOT DETECTED NOT DETECTED Final   Staphylococcus lugdunensis NOT DETECTED NOT DETECTED Final   Streptococcus species NOT DETECTED NOT DETECTED Final   Streptococcus agalactiae NOT DETECTED NOT DETECTED Final   Streptococcus pneumoniae NOT DETECTED NOT DETECTED Final   Streptococcus pyogenes NOT DETECTED NOT DETECTED Final   A.calcoaceticus-baumannii NOT DETECTED NOT DETECTED Final   Bacteroides fragilis NOT DETECTED NOT DETECTED Final   Enterobacterales DETECTED (A) NOT DETECTED Final    Comment: Enterobacterales represent a large order of gram negative bacteria, not a single organism. CRITICAL RESULT CALLED TO, READ BACK BY AND VERIFIED WITH: PHARM D K.[facility] ON [id number] AT 2022 BY E.PARRISH    Enterobacter cloacae complex NOT DETECTED NOT DETECTED Final   Escherichia  coli DETECTED (A) NOT DETECTED Final    Comment: CRITICAL RESULT CALLED TO, READ BACK BY AND VERIFIED WITH: PHARM D K.HURTH ON [id number] AT 2022 BY E.PARRISH    Klebsiella aerogenes NOT DETECTED NOT DETECTED Final   Klebsiella oxytoca NOT DETECTED NOT DETECTED Final   Klebsiella pneumoniae NOT DETECTED NOT DETECTED Final   Proteus species NOT DETECTED NOT DETECTED Final   Salmonella species NOT DETECTED NOT DETECTED Final   Serratia marcescens NOT DETECTED NOT DETECTED Final   Haemophilus influenzae NOT DETECTED NOT DETECTED Final   Neisseria meningitidis NOT DETECTED NOT DETECTED Final   Pseudomonas aeruginosa NOT DETECTED NOT DETECTED Final   Stenotrophomonas maltophilia NOT DETECTED NOT DETECTED Final   Candida albicans NOT DETECTED NOT DETECTED Final   Candida auris NOT DETECTED NOT DETECTED Final   Candida glabrata NOT DETECTED NOT DETECTED Final   Candida krusei NOT DETECTED NOT DETECTED Final   Candida parapsilosis NOT DETECTED NOT DETECTED Final   Candida tropicalis NOT DETECTED NOT DETECTED Final   Cryptococcus neoformans/gattii NOT DETECTED NOT DETECTED Final   CTX-M ESBL NOT DETECTED NOT DETECTED Final   Carbapenem resistance IMP NOT DETECTED NOT DETECTED Final   Carbapenem resistance KPC NOT DETECTED NOT DETECTED Final   Carbapenem resistance NDM NOT DETECTED NOT DETECTED Final   Carbapenem resist OXA 48 LIKE NOT DETECTED NOT DETECTED Final   Carbapenem resistance VIM NOT DETECTED NOT DETECTED Final    Comment: Performed at [facility], [address], [place], [place] [address]  Blood culture (routine x 2)     Status: Abnormal   Collection Time: 02/11/21  3:20 AM   Specimen: BLOOD  Result Value Ref Range Status   Specimen Description BLOOD SITE NOT SPECIFIED  Final   Special Requests   Final    BOTTLES DRAWN AEROBIC AND ANAEROBIC Blood Culture adequate volume   Culture  Setup Time   Final    GRAM NEGATIVE RODS IN BOTH AEROBIC AND ANAEROBIC BOTTLES CRITICAL  VALUE NOTED.  VALUE IS CONSISTENT WITH PREVIOUSLY REPORTED AND CALLED VALUE.    Culture (A)  Final    ESCHERICHIA COLI SUSCEPTIBILITIES PERFORMED ON PREVIOUS CULTURE WITHIN THE LAST 5 DAYS. Performed at [facility], [address] [place], [place] [id number]    Report Status 02/13/2021 FINAL  Final       Radiology Studies: CT Angio Chest Pulmonary Embolism (PE) W or WO Contrast  Result Date: 02/13/2021 CLINICAL DATA:  Inpatient.  Chest pain and dyspnea. EXAM: CT ANGIOGRAPHY CHEST WITH CONTRAST TECHNIQUE: Multidetector CT imaging of the chest was performed using the standard protocol during bolus administration of intravenous contrast. Multiplanar CT image reconstructions and MIPs were obtained to evaluate the vascular anatomy. CONTRAST:  100mL OMNIPAQUE IOHEXOL 350 MG/ML SOLN COMPARISON:  02/12/2021 chest radiograph. 02/10/2016 chest CT angiogram. FINDINGS: Cardiovascular: The study is low to moderate quality for the evaluation of pulmonary embolism, with degradation due to motion and suboptimal bolus opacification. There are no convincing filling defects in the central, lobar, segmental or subsegmental pulmonary artery branches to suggest acute pulmonary embolism. Minimally atherosclerotic nonaneurysmal thoracic aorta. Dilated main pulmonary artery (4.0 cm diameter). Mild cardiomegaly. Stable trace pericardial effusion. Mediastinum/Nodes: No discrete thyroid nodules. Unremarkable esophagus. No axillary adenopathy. Several mildly enlarged high left prevascular lymph nodes, largest 1.2 cm short axis diameter (series 7/image 82), not substantially changed since 02/10/2016 chest CT. Mildly enlarged 1.1 cm high left paratracheal node (series 7/image 67), not substantially changed. Mildly enlarged right prevascular nodes up to 1.0 cm (series 5/image 47), not substantially changed. No new pathologically enlarged mediastinal nodes. No pathologically enlarged hilar nodes. Lungs/Pleura: No pneumothorax.  Small dependent right pleural effusion. No left pleural effusion. Mild to moderate passive atelectasis in the dependent lower lobes, right greater than left. No lung masses or central airway stenoses. No significant pulmonary nodules. Upper abdomen: Left colonic diverticulosis. Musculoskeletal: No aggressive appearing focal osseous lesions. Moderate thoracic spondylosis. Review of the MIP images confirms the above findings. IMPRESSION: 1. Limited motion degraded scan. No evidence of pulmonary embolism. 2. Mild cardiomegaly. Stable trace pericardial effusion. 3. Dilated main pulmonary artery, suggesting pulmonary arterial hypertension. 4. Small dependent right pleural effusion. Mild to moderate passive atelectasis in the dependent lower lobes, right greater than left. 5. Chronic nonspecific mild mediastinal lymphadenopathy, not substantially changed since 2017 chest CT, probably reactive. 6. Aortic Atherosclerosis (ICD10-I70.0). Electronically Signed   By: Delbert PhenixJason A Poff M.D.   On: 02/13/2021 11:12   DG CHEST PORT 1 VIEW  Result Date: 02/12/2021 CLINICAL DATA:  Tachypnea, chest pressure EXAM: PORTABLE CHEST 1 VIEW COMPARISON:  05/11/2020 FINDINGS: Single frontal view of the chest demonstrates mild enlargement the cardiac silhouette, likely accentuated by portable AP positioning. No airspace disease, effusion, or pneumothorax. No acute bony abnormalities. IMPRESSION: 1. No acute intrathoracic process. Electronically Signed   By: Sharlet SalinaMichael  Brown M.D.   On: 02/12/2021 21:21      Scheduled Meds:  aspirin  81 mg Oral Daily   atorvastatin  80 mg Oral Daily   insulin aspart  0-15 Units Subcutaneous Q4H   losartan  25 mg Oral Daily   senna  1 tablet Oral Daily   Continuous Infusions:  sodium chloride 75 mL/hr at 02/13/21 0933   cefTRIAXone (  ROCEPHIN)  IV 2 g (02/13/21 0629)   heparin 1,500 Units/hr (02/13/21 0933)   promethazine (PHENERGAN) injection (IM or IVPB)       LOS: 2 days      Time spent: 25  minutes   Noralee Stain, DO [facility] 02/13/2021, 11:25 AM   Available via Epic secure chat 7am-7pm After these hours, please refer to coverage provider listed on [email or web address]

## 2021-02-13 NOTE — TOC Progression Note (Signed)
Transition of Care [facility]) - Progression Note    Patient Details  Name: Nicole [name] MRN: [id number] Date of Birth: [birth year]  Transition of Care [facility]) CM/SW Contact  Bess Kinds, RN Phone Number: [phone] 02/13/2021, 5:10 PM  Clinical Narrative:     Received request from inpt diabetes coordinator that patient was requesting to change from [facility] to [facility]. Appointment scheduled at [facility] for 8/26 8:30 am, which was next available.      Expected Discharge Plan and Services                                                 Social Determinants of Health (SDOH) Interventions    Readmission Risk Interventions No flowsheet data found.

## 2021-02-13 NOTE — Progress Notes (Signed)
   02/12/21 2043  Assess: if the MEWS score is Yellow or Red  Were vital signs taken at a resting state? Yes  Focused Assessment Change from prior assessment (see assessment flowsheet)  Early Detection of Sepsis Score *See Row Information* Medium  MEWS guidelines implemented *See Row Information* Yes  Treat  Pain Scale 0-10  Pain Score 6  Pain Type Acute pain  Pain Location Chest  Pain Orientation Right  Pain Radiating Towards right arm  Pain Descriptors / Indicators Pressure  Pain Frequency Constant  Pain Onset Sudden  Patients Stated Pain Goal 0  Pain Intervention(s) Emotional support;MD notified (Comment)  Take Vital Signs  Increase Vital Sign Frequency  Yellow: Q 2hr X 2 then Q 4hr X 2, if remains yellow, continue Q 4hrs  Escalate  MEWS: Escalate Yellow: discuss with charge nurse/RN and consider discussing with provider and RRT  Notify: Charge Nurse/RN  Name of Charge Nurse/RN Notified [place] Shi RN  Date Charge Nurse/RN Notified 02/12/21  Notify: Provider  Provider Name/Title Dr. Rachael Darby  Date Provider Notified 02/12/21  Time Provider Notified 2043  Notification Type Page  Notification Reason Change in status  Provider response See new orders  Date of Provider Response 02/12/21   Patient c/o sudden onset to right sided chest pain with shortness of breath.  Per patient, this is new.  VS stable.  She was not complaining of abdominal pain at that time.  Patient now in Yellow MEWS d/t RR and protocol implemented.  Dr. Rachael Darby made aware.  Stat EKG obtained and showed NSR.  Patient placed on Telemetry.  1st Troponin was critical value of 268.  Dr. Rachael Darby made aware.  Orders received to initiate Heparin gtt.  Another EKG ordered and done.  2nd Troponin was 278.  2nd PIV obtained and Heparin gtt started.  Patient was multiple episodes of nausea during the night and Zofran given at 2305 and 0444.  She was began complaining of severe abdominal pain with the chest pressure 3/10.   IV Dilaudid given at 2058 and 0151.  Also, IV Metoprolol given at 2305 for SBP greater than 150.  IV Ativan 1 mg given at 2323 for anxiety.  At approximately 0400, her O2 level on RA was 88%.  Mild shortness of breath.  Patient only came up to 91-92% on 1 LPM [place].  Placed on 2 LPM [place] and levels now 95% - 96%.  Will continue to monitor patient.  Bernie Covey RN

## 2021-02-13 NOTE — Consult Note (Signed)
Cardiology Consultation:   Patient ID: [name] MRN: [id number]; DOB: 01-01-1976  Admit date: 02/10/2021 Date of Consult: 02/13/2021  PCP:  Hoy Register, MD   [facility] Providers Cardiologist:  None        Patient Profile:   Nicole [name] is a 45 y.o. female with a hx of severe obesity, T2DM A1c 10, s/p appendectomy who is being seen 02/13/2021 for the evaluation of chest pain and troponin elevation at the request of Dr Elige Radon Nicole [name]  History of Present Illness:   Ms. Nicole [name] presented to the ED 02/11/21 with sudden RUQ pain, nausea, and vomiting as well as fevers. Febrile to 102.3 No pathology was seen on the right abdomen however there was acute sigmoid diverticulitis was seen on CT. Started on rocephin and flagyl. Found to have E coli bacteremia. Remained febrile 7/17. Evening of 7/17 patient reported right-sided chest pain worse with deep breaths and laying down. Reports pain worst at 4/10, still significantly better then abdominal pain. Has since improved somewhat but not fully resolved. Workup included Ekgs with V2 TWI (previously present in V1 but not V2), otherwise no changes. Troponin 200s x2. Cardiology consulted   Past Medical History:  Diagnosis Date   Arthritis    knees   Hypertension    Ovarian cyst    Pregnancy induced hypertension     Past Surgical History:  Procedure Laterality Date   APPENDECTOMY     CESAREAN SECTION     HERNIA REPAIR     LAPAROSCOPIC APPENDECTOMY N/A 03/25/2014   Procedure: APPENDECTOMY LAPAROSCOPIC;  Surgeon: Cherylynn Ridges, MD;  Location: [facility] OR;  Service: General;  Laterality: N/A;       Inpatient Medications: Scheduled Meds:  insulin aspart  0-5 Units Subcutaneous QHS   insulin aspart  0-9 Units Subcutaneous TID WC   senna  1 tablet Oral Daily   Continuous Infusions:  cefTRIAXone (ROCEPHIN)  IV 2 g (02/12/21 0728)   heparin 1,200 Units/hr (02/13/21 0053)   lactated ringers 100 mL/hr at 02/13/21 0045   promethazine  (PHENERGAN) injection (IM or IVPB)     PRN Meds: acetaminophen, albuterol, HYDROmorphone (DILAUDID) injection, ibuprofen, metoprolol tartrate, ondansetron (ZOFRAN) IV, oxyCODONE, promethazine (PHENERGAN) injection (IM or IVPB)  Allergies:   No Known Allergies  Social History:   Social History   Socioeconomic History   Marital status: Single    Spouse name: Not on file   Number of children: Not on file   Years of education: Not on file   Highest education level: Not on file  Occupational History   Not on file  Tobacco Use   Smoking status: Former    Packs/day: 0.50    Years: 27.00    Pack years: 13.50    Types: Cigarettes    Quit date: 10/28/2020    Years since quitting: 0.2   Smokeless tobacco: Never   Tobacco comments:    6 cigs daily  Vaping Use   Vaping Use: Never used  Substance and Sexual Activity   Alcohol use: Not Currently    Comment: occ   Drug use: Yes    Frequency: 7.0 times per week    Types: Marijuana   Sexual activity: Not Currently    Comment: 5 years  Other Topics Concern   Not on file  Social History Narrative   Not on file   Social Determinants of Health   Financial Resource Strain: Not on file  Food Insecurity: No Food Insecurity   Worried About  Running Out of Food in the Last Year: Never true   Ran Out of Food in the Last Year: Never true  Transportation Needs: No Transportation Needs   Lack of Transportation (Medical): No   Lack of Transportation (Non-Medical): No  Physical Activity: Not on file  Stress: Not on file  Social Connections: Not on file  Intimate Partner Violence: Not on file    Family History:   Family History  Problem Relation Age of Onset   Hypertension Mother    Cancer Mother        pancreatic cancer   Arthritis Father    Heart disease Maternal Grandmother      ROS:  Please see the history of present illness.   All other ROS reviewed and negative.     Physical Exam/Data:   Vitals:   02/12/21 2243 02/13/21  0339 02/13/21 0410 02/13/21 0413  BP: (!) 174/97 (!) 155/85    Pulse: 74 94    Resp: (!) 21 (!) 22 (!) 22   Temp: 98.5 F (36.9 C) 98.8 F (37.1 C)    TempSrc: Oral Oral    SpO2: 97% (!) 88% 91% 95%  Weight:      Height:        Intake/Output Summary (Last 24 hours) at 02/13/2021 0433 Last data filed at 02/12/2021 2245 Gross per 24 hour  Intake 508.99 ml  Output 3 ml  Net 505.99 ml   Last 3 Weights 02/10/2021 12/16/2020 09/13/2020  Weight (lbs) 345 lb 10.9 oz 345 lb 9.6 oz 353 lb  Weight (kg) 156.8 kg 156.763 kg 160.12 kg     Body mass index is 52.56 kg/m.  General:  Well nourished, well developed, in moderate distress HEENT: normal Lymph: no adenopathy Neck: no JVD Endocrine:  No thryomegaly Vascular: No carotid bruits; FA pulses 2+ bilaterally without bruits  Cardiac:  normal S1, S2; RRR; no murmur  Lungs:  clear to auscultation bilaterally, no wheezing, rhonchi or rales  Abd: soft, nontender, no hepatomegaly  Ext: no edema Musculoskeletal:  No deformities, BUE and BLE strength normal and equal Skin: warm and dry  Neuro:  CNs 2-12 intact, no focal abnormalities noted Psych:  Normal affect   EKG:  The EKG was personally reviewed and demonstrates:  NSR, mild TWI V1/V2 without any other ST-T changes Telemetry:  Telemetry was personally reviewed and demonstrates:  nsr  Relevant CV Studies: none  Laboratory Data:  High Sensitivity Troponin:   Recent Labs  Lab 02/12/21 2103 02/12/21 2304 02/13/21 0111  TROPONINIHS 268* 278* 310*     Chemistry Recent Labs  Lab 02/10/21 1845 02/11/21 0353 02/12/21 0347 02/13/21 0111  NA 136  --  133* 133*  K 4.2  --  4.2 3.5  CL 104  --  98 98  CO2 23  --  28 25  GLUCOSE 227*  --  225* 222*  BUN 15  --  8 7  CREATININE 1.08* 1.01* 1.14* 0.94  CALCIUM 9.4  --  9.0 8.5*  GFRNONAA >60 >60 >60 >60  ANIONGAP 9  --  7 10    Recent Labs  Lab 02/10/21 1845  PROT 6.9  ALBUMIN 3.3*  AST 40  ALT 40  ALKPHOS 103  BILITOT 0.7    Hematology Recent Labs  Lab 02/11/21 0353 02/12/21 0347 02/13/21 0111  WBC 17.8* 14.6* 9.8  RBC 5.03 4.68 4.45  HGB 13.6 12.6 11.8*  HCT 42.3 39.5 37.7  MCV 84.1 84.4 84.7  MCH 27.0 26.9  26.5  MCHC 32.2 31.9 31.3  RDW 14.0 14.0 13.9  PLT 196 164 132*   BNPNo results for input(s): BNP, PROBNP in the last 168 hours.  DDimer No results for input(s): DDIMER in the last 168 hours.   Radiology/Studies:  CT Abdomen Pelvis W Contrast  Result Date: 02/11/2021 CLINICAL DATA:  Acute abdominal pain EXAM: CT ABDOMEN AND PELVIS WITH CONTRAST TECHNIQUE: Multidetector CT imaging of the abdomen and pelvis was performed using the standard protocol following bolus administration of intravenous contrast. CONTRAST:  OMNIPAQUE IOHEXOL 300 MG/ML  SOLN COMPARISON:  Right upper quadrant ultrasound dated 02/10/2021. CT abdomen/pelvis dated 03/25/2014. FINDINGS: Lower chest: Lung bases are clear. Hepatobiliary: Liver is within normal limits. Gallbladder is unremarkable. No intrahepatic or extrahepatic ductal dilatation. Pancreas: Within normal limits. Spleen: Within normal limits. Adrenals/Urinary Tract: Adrenal glands are within normal limits. Kidneys are within normal limits.  No hydronephrosis. Bladder is within normal limits. Stomach/Bowel: Stomach is within normal limits. No evidence of bowel obstruction. Prior appendectomy. Sigmoid diverticulosis with mild pericolonic inflammatory changes in the left lower quadrant (coronal image 40), suggesting mild sigmoid diverticulitis. No drainable fluid collection/abscess. No free air to suggest macroscopic perforation. Vascular/Lymphatic: No evidence of abdominal aortic aneurysm. Atherosclerotic calcifications of the abdominal aorta and branch vessels. No suspicious abdominopelvic lymphadenopathy. Reproductive: Heterogeneous uterus, suggesting uterine fibroids. 2.0 cm fat density left ovarian lesion (series 3/image 69), suggesting a left ovarian dermoid, grossly  unchanged. No right adnexal mass. Other: No abdominopelvic ascites. Musculoskeletal: Degenerative changes of the visualized thoracolumbar spine. IMPRESSION: Mild sigmoid diverticulitis. No drainable fluid collection/abscess. No free air. 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 Electronically Signed   By: Charline Bills M.D.   On: 02/11/2021 01:21   DG CHEST PORT 1 VIEW  Result Date: 02/12/2021 CLINICAL DATA:  Tachypnea, chest pressure EXAM: PORTABLE CHEST 1 VIEW COMPARISON:  05/11/2020 FINDINGS: Single frontal view of the chest demonstrates mild enlargement the cardiac silhouette, likely accentuated by portable AP positioning. No airspace disease, effusion, or pneumothorax. No acute bony abnormalities. IMPRESSION: 1. No acute intrathoracic process. Electronically Signed   By: Sharlet Salina M.D.   On: 02/12/2021 21:21   US Abdomen Limited RUQ (LIVER/GB)  Result Date: 02/10/2021 CLINICAL DATA:  Nausea, vomiting, abdominal pain EXAM: ULTRASOUND ABDOMEN LIMITED RIGHT UPPER QUADRANT COMPARISON:  11/14/2016 FINDINGS: Gallbladder: No gallstones or wall thickening visualized. No sonographic Murphy sign noted by sonographer. Common bile duct: Diameter: 6 mm in proximal diameter Liver: The hepatic parenchymal echogenicity is mildly, diffusely increased, similar to that noted on prior examination, in keeping with changes of mild hepatic steatosis. No focal intrahepatic lesions are identified. No intrahepatic biliary ductal dilation. Portal vein is patent on color Doppler imaging with normal direction of blood flow towards the liver. Other: None. IMPRESSION: Stable mild hepatic steatosis. Electronically Signed   By: Helyn Numbers MD   On: 02/10/2021 23:15     Assessment and Plan:   Troponin elevation: Chest pain symptoms are fairly atypical and troponin rise is only in the 200s and flat. Especially given e coli bacteremia and continued severe abdominal symptoms I would favor a T2 (demand) ischemic process  over true ACS. A TTE would be very helpful to delineate this. If there are any regional wall motion abnormalities, that would push towards obtaining a LHC; otherwise would defer LHC - TTE ordered - okay to continue heparin gtt for 48 hours but low threshold to stop - obtain one more troponin  Risk Assessment/Risk Scores:  For questions or updates, please contact [facility] Please consult [email or web address] for contact info under    Signed, [place] Marguerite Barba, MD  02/13/2021 4:33 AM

## 2021-02-14 LAB — BASIC METABOLIC PANEL
Anion gap: 7 (ref 5–15)
BUN: 6 mg/dL (ref 6–20)
CO2: 25 mmol/L (ref 22–32)
Calcium: 8.2 mg/dL — ABNORMAL LOW (ref 8.9–10.3)
Chloride: 101 mmol/L (ref 98–111)
Creatinine, Ser: 0.79 mg/dL (ref 0.44–1.00)
GFR, Estimated: >60 mL/min (ref >60)
Glucose, Bld: 216 mg/dL — ABNORMAL HIGH (ref 70–99)
Potassium: 3.3 mmol/L — ABNORMAL LOW (ref 3.5–5.1)
Sodium: 133 mmol/L — ABNORMAL LOW (ref 135–145)

## 2021-02-14 LAB — CBC
HCT: 35.4 % — ABNORMAL LOW (ref 36.0–46.0)
Hemoglobin: 11.2 g/dL — ABNORMAL LOW (ref 12.0–15.0)
MCH: 27 pg (ref 26.0–34.0)
MCHC: 31.6 g/dL (ref 30.0–36.0)
MCV: 85.3 fL (ref 80.0–100.0)
Platelets: 144 10*3/uL — ABNORMAL LOW (ref 150–400)
RBC: 4.15 MIL/uL (ref 3.87–5.11)
RDW: 13.7 % (ref 11.5–15.5)
WBC: 7 10*3/uL (ref 4.0–10.5)
nRBC: 0 % (ref 0.0–0.2)

## 2021-02-14 LAB — HEPARIN LEVEL (UNFRACTIONATED): Heparin Unfractionated: 0.19 IU/mL — ABNORMAL LOW (ref 0.30–0.70)

## 2021-02-14 LAB — GLUCOSE, CAPILLARY
Glucose-Capillary: 207 mg/dL — ABNORMAL HIGH (ref 70–99)
Glucose-Capillary: 159 mg/dL — ABNORMAL HIGH (ref 70–99)
Glucose-Capillary: 171 mg/dL — ABNORMAL HIGH (ref 70–99)
Glucose-Capillary: 187 mg/dL — ABNORMAL HIGH (ref 70–99)

## 2021-02-14 MED ORDER — POTASSIUM CHLORIDE CRYS ER 20 MEQ PO TBCR
40.0000 meq | EXTENDED_RELEASE_TABLET | Freq: Once | ORAL | Status: AC
  Administered 2021-02-14: 40 meq via ORAL
  Filled 2021-02-14: qty 2

## 2021-02-14 MED ORDER — INSULIN GLARGINE 100 UNIT/ML ~~LOC~~ SOLN
20.0000 [IU] | Freq: Every day | SUBCUTANEOUS | Status: DC
  Administered 2021-02-15: 20 [IU] via SUBCUTANEOUS
  Filled 2021-02-14: qty 0.2

## 2021-02-14 MED ORDER — LORAZEPAM 2 MG/ML IJ SOLN
1.0000 mg | Freq: Once | INTRAMUSCULAR | Status: AC
  Administered 2021-02-14: 1 mg via INTRAVENOUS
  Filled 2021-02-14: qty 1

## 2021-02-14 MED ORDER — FUROSEMIDE 10 MG/ML IJ SOLN
20.0000 mg | Freq: Once | INTRAMUSCULAR | Status: AC
  Administered 2021-02-14: 20 mg via INTRAVENOUS
  Filled 2021-02-14: qty 2

## 2021-02-14 MED ORDER — POTASSIUM CHLORIDE CRYS ER 20 MEQ PO TBCR: 40.0000 meq | EXTENDED_RELEASE_TABLET | Freq: Once | ORAL | Status: AC

## 2021-02-14 NOTE — Progress Notes (Signed)
ANTICOAGULATION CONSULT NOTE   Pharmacy Consult for heparin Indication: chest pain/ACS  No Known Allergies  Patient Measurements: Height: 5\' 8"  (172.7 cm) Weight: (!) 156.8 kg (345 lb 10.9 oz) IBW/kg (Calculated) : 63.9 Heparin Dosing Weight: 103 kg   Vital Signs: Temp: 99.3 F (37.4 C) (07/18 2343) Temp Source: Oral (07/18 2343) BP: 180/85 (07/18 2343) Pulse Rate: 70 (07/18 2343)  Labs: Recent Labs    02/12/21 0347 02/12/21 2103 02/13/21 0111 02/13/21 0703 02/13/21 1556 02/13/21 2029 02/13/21 2232 02/14/21 0156  HGB 12.6  --  11.8*  --   --   --   --  11.2*  HCT 39.5  --  37.7  --   --   --   --  35.4*  PLT 164  --  132*  --   --   --   --  144*  HEPARINUNFRC  --   --   --  0.14* 0.13*  --   --  0.19*  CREATININE 1.14*  --  0.94  --   --   --   --  0.79  TROPONINIHS  --    < > 310*  --   --  163* 140*  --    < > = values in this interval not displayed.     Estimated Creatinine Clearance: 140.2 mL/min (by C-G formula based on SCr of 0.79 mg/dL).   Medical History: Past Medical History:  Diagnosis Date   Arthritis    knees   Hypertension    Ovarian cyst    Pregnancy induced hypertension     Medications:  Scheduled:   aspirin  81 mg Oral Daily   atorvastatin  80 mg Oral Daily   azithromycin  500 mg Oral Daily   insulin aspart  0-15 Units Subcutaneous TID WC   insulin glargine  15 Units Subcutaneous Daily   losartan  25 mg Oral Daily   senna  1 tablet Oral Daily    Assessment: 45 yo female presenting with RUQ pain found to have acute sigmoid diverticulitis - no AC PTA. Found to have chest pain tonight - MD wanting to start heparin for ACS for 48h.   Heparin level subtherapeutic at 0.13 on 1500 units/hr. CBC stable - Hb 11.8, plt 132. No signs/sx of bleeding. Trop still elevated at 310. Of note, received enoxaparin 75 mg on 7/17 @0948 .   7/19 AM update:  Heparin level low but trending up No issues per RN  Goal of Therapy:  Heparin level 0.3-0.7  units/ml Monitor platelets by anticoagulation protocol: Yes   Plan:  Inc heparin to 2000 units/hr 1200 heparin level  , PharmD, BCPS Clinical Pharmacist Phone: [phone]

## 2021-02-14 NOTE — Progress Notes (Addendum)
Inpatient Diabetes Program Recommendations  AACE/ADA: New Consensus Statement on Inpatient Glycemic Control (2015)  Target Ranges:  Prepandial:   less than 140 mg/dL      Peak postprandial:   less than 180 mg/dL (1-2 hours)      Critically ill patients:  140 - 180 mg/dL   Lab Results  Component Value Date   GLUCAP 207 (H) 02/14/2021   HGBA1C 10.0 (H) 02/11/2021    Review of Glycemic Control Results for Nicole [name], Nicole [name] (MRN [id number]) as of 02/14/2021 10:28  Ref. Range 02/13/2021 11:31 02/13/2021 16:22 02/13/2021 22:10 02/14/2021 06:38  Glucose-Capillary Latest Ref Range: 70 - 99 mg/dL 240 (H) 188 (H) 186 (H) 207 (H)   Diabetes history: Type 2 DM Outpatient Diabetes medications: Amaryl 1 mg qd Current orders for Inpatient glycemic control: Novolog 0-15 units TID, Lantus 15 units QD   Inpatient Diabetes Program Recommendations:    Consider increasing Lantus to 20 units QD.  In reviewing glucose trends and current insulin needs, would anticipate need for insulin outpatient. Patient will need a meter at discharge. Blood glucose meter kit (#[id number]).  Addendum: Spoke with patient again to review and reinforce concepts discussed yesterday. Educated patient on insulin pen use at home. Reviewed contents of insulin flexpen starter kit. Reviewed all steps if insulin pen including attachment of needle, 2-unit air shot, dialing up dose, giving injection, removing needle, disposal of sharps, storage of unused insulin, disposal of insulin etc. Patient able to provide successful return demonstration. Also reviewed troubleshooting with insulin pen. MD to give patient Rxs for insulin pens and insulin pen needles.  Thanks, Bronson Curb, MSN, RNC-OB Diabetes Coordinator [phone] (8a-5p)

## 2021-02-14 NOTE — Progress Notes (Signed)
Cardiology Progress Note  Patient ID: Nicole [name] MRN: [id number] DOB: 1976-04-22 Date of Encounter: 02/14/2021  Primary Cardiologist: None  Subjective   Chief Complaint: Shortness of breath  HPI: No further chest pain.  Reports shortness of breath with exertion.  CT scan negative for PE.  Echo shows normal LV function with normal diastolic parameters.  She has a right pleural effusion.  Hospital medicine covering for pneumonia.  ROS:  All other ROS reviewed and negative. Pertinent positives noted in the HPI.     Inpatient Medications  Scheduled Meds:  aspirin  81 mg Oral Daily   atorvastatin  80 mg Oral Daily   azithromycin  500 mg Oral Daily   insulin aspart  0-15 Units Subcutaneous TID WC   insulin glargine  15 Units Subcutaneous Daily   losartan  25 mg Oral Daily   senna  1 tablet Oral Daily   Continuous Infusions:   ceFAZolin (ANCEF) IV 2 g (02/14/21 0622)   promethazine (PHENERGAN) injection (IM or IVPB)     PRN Meds: acetaminophen, albuterol, HYDROmorphone (DILAUDID) injection, metoprolol tartrate, ondansetron (ZOFRAN) IV, oxyCODONE, promethazine (PHENERGAN) injection (IM or IVPB)   Vital Signs   Vitals:   02/13/21 1741 02/13/21 2343 02/14/21 0524 02/14/21 0923  BP: (!) 146/72 (!) 180/85 (!) 155/76 (!) 158/98  Pulse: 70 70 84 85  Resp: Temp: 98.5 F (36.9 C) 99.3 F (37.4 C) 99.6 F (37.6 C) 97.7 F (36.5 C)  TempSrc: Oral Oral Oral Oral  SpO2: 98% 96% 94% 95%  Weight:      Height:        Intake/Output Summary (Last 24 hours) at 02/14/2021 0946 Last data filed at 02/14/2021 0622 Gross per 24 hour  Intake 3210.09 ml  Output 500 ml  Net 2710.09 ml   Last 3 Weights 02/10/2021 12/16/2020 09/13/2020  Weight (lbs) 345 lb 10.9 oz 345 lb 9.6 oz 353 lb  Weight (kg) 156.8 kg 156.763 kg 160.12 kg    Telemetry  Overnight telemetry shows sinus tachycardia low 100s, which I personally reviewed.   ECG  The most recent ECG shows normal sinus rhythm  heart rate 68, no acute ischemic changes or evidence of infarction, normal EKG, which I personally reviewed.   Physical Exam   Vitals:   02/13/21 1741 02/13/21 2343 02/14/21 0524 02/14/21 0923  BP: (!) 146/72 (!) 180/85 (!) 155/76 (!) 158/98  Pulse: 70 70 84 85  Resp: Temp: 98.5 F (36.9 C) 99.3 F (37.4 C) 99.6 F (37.6 C) 97.7 F (36.5 C)  TempSrc: Oral Oral Oral Oral  SpO2: 98% 96% 94% 95%  Weight:      Height:        Intake/Output Summary (Last 24 hours) at 02/14/2021 0946 Last data filed at 02/14/2021 0622 Gross per 24 hour  Intake 3210.09 ml  Output 500 ml  Net 2710.09 ml    Last 3 Weights 02/10/2021 12/16/2020 09/13/2020  Weight (lbs) 345 lb 10.9 oz 345 lb 9.6 oz 353 lb  Weight (kg) 156.8 kg 156.763 kg 160.12 kg    Body mass index is 52.56 kg/m.   General: Well nourished, well developed, in no acute distress Head: Atraumatic, normal size  Eyes: PEERLA, EOMI  Neck: Supple, no JVD Endocrine: No thryomegaly Cardiac: Normal S1, S2; RRR; no murmurs, rubs, or gallops Lungs: Diminished breath sounds at the right lung base Abd: Soft, nontender, no hepatomegaly  Ext: No edema, pulses 2+ Musculoskeletal:  No deformities, BUE and BLE strength normal and equal Skin: Warm and dry, no rashes   Neuro: Alert and oriented to person, place, time, and situation, CNII-XII grossly intact, no focal deficits  Psych: Normal mood and affect   Labs  High Sensitivity Troponin:   Recent Labs  Lab 02/12/21 2103 02/12/21 2304 02/13/21 0111 02/13/21 2029 02/13/21 2232  TROPONINIHS 268* 278* 310* 163* 140*     Cardiac EnzymesNo results for input(s): TROPONINI in the last 168 hours. No results for input(s): TROPIPOC in the last 168 hours.  Chemistry Recent Labs  Lab 02/10/21 1845 02/11/21 0353 02/12/21 0347 02/13/21 0111 02/14/21 0156  NA 136  --  133* 133* 133*  K 4.2  --  4.2 3.5 3.3*  CL 104  --  98 98 101  CO2 23  --  28 25 25   GLUCOSE 227*  --  225* 222* 216*   BUN 15  --  8 7 6   CREATININE 1.08*   < > 1.14* 0.94 0.79  CALCIUM 9.4  --  9.0 8.5* 8.2*  PROT 6.9  --   --   --   --   ALBUMIN 3.3*  --   --   --   --   AST 40  --   --   --   --   ALT 40  --   --   --   --   ALKPHOS 103  --   --   --   --   BILITOT 0.7  --   --   --   --   GFRNONAA >60   < > >60 >60 >60  ANIONGAP 9  --  7 10 7    < > = values in this interval not displayed.    Hematology Recent Labs  Lab 02/12/21 0347 02/13/21 0111 02/14/21 0156  WBC 14.6* 9.8 7.0  RBC 4.68 4.45 4.15  HGB 12.6 11.8* 11.2*  HCT 39.5 37.7 35.4*  MCV 84.4 84.7 85.3  MCH 26.9 26.5 27.0  MCHC 31.9 31.3 31.6  RDW 14.0 13.9 13.7  PLT 164 132* 144*   BNPNo results for input(s): BNP, PROBNP in the last 168 hours.  DDimer No results for input(s): DDIMER in the last 168 hours.   Radiology  CT Angio Chest Pulmonary Embolism (PE) W or WO Contrast  Result Date: 02/13/2021 CLINICAL DATA:  Inpatient.  Chest pain and dyspnea. EXAM: CT ANGIOGRAPHY CHEST WITH CONTRAST TECHNIQUE: Multidetector CT imaging of the chest was performed using the standard protocol during bolus administration of intravenous contrast. Multiplanar CT image reconstructions and MIPs were obtained to evaluate the vascular anatomy. CONTRAST:  02/15/21 OMNIPAQUE IOHEXOL 350 MG/ML SOLN COMPARISON:  02/12/2021 chest radiograph. 02/10/2016 chest CT angiogram. FINDINGS: Cardiovascular: The study is low to moderate quality for the evaluation of pulmonary embolism, with degradation due to motion and suboptimal bolus opacification. There are no convincing filling defects in the central, lobar, segmental or subsegmental pulmonary artery branches to suggest acute pulmonary embolism. Minimally atherosclerotic nonaneurysmal thoracic aorta. Dilated main pulmonary artery (4.0 cm diameter). Mild cardiomegaly. Stable trace pericardial effusion. Mediastinum/Nodes: No discrete thyroid nodules. Unremarkable esophagus. No axillary adenopathy. Several mildly enlarged  high left prevascular lymph nodes, largest 1.2 cm short axis diameter (series 7/image 82), not substantially changed since 02/10/2016 chest CT. Mildly enlarged 1.1 cm high left paratracheal node (series 7/image 67), not substantially changed. Mildly enlarged right prevascular nodes up to 1.0 cm (series 5/image 47), not substantially changed. No new pathologically  enlarged mediastinal nodes. No pathologically enlarged hilar nodes. Lungs/Pleura: No pneumothorax. Small dependent right pleural effusion. No left pleural effusion. Mild to moderate passive atelectasis in the dependent lower lobes, right greater than left. No lung masses or central airway stenoses. No significant pulmonary nodules. Upper abdomen: Left colonic diverticulosis. Musculoskeletal: No aggressive appearing focal osseous lesions. Moderate thoracic spondylosis. Review of the MIP images confirms the above findings. IMPRESSION: 1. Limited motion degraded scan. No evidence of pulmonary embolism. 2. Mild cardiomegaly. Stable trace pericardial effusion. 3. Dilated main pulmonary artery, suggesting pulmonary arterial hypertension. 4. Small dependent right pleural effusion. Mild to moderate passive atelectasis in the dependent lower lobes, right greater than left. 5. Chronic nonspecific mild mediastinal lymphadenopathy, not substantially changed since 2017 chest CT, probably reactive. 6. Aortic Atherosclerosis (ICD10-I70.0). Electronically Signed   By: Delbert Phenix M.D.   On: 02/13/2021 11:12   DG CHEST PORT 1 VIEW  Result Date: 02/12/2021 CLINICAL DATA:  Tachypnea, chest pressure EXAM: PORTABLE CHEST 1 VIEW COMPARISON:  05/11/2020 FINDINGS: Single frontal view of the chest demonstrates mild enlargement the cardiac silhouette, likely accentuated by portable AP positioning. No airspace disease, effusion, or pneumothorax. No acute bony abnormalities. IMPRESSION: 1. No acute intrathoracic process. Electronically Signed   By: Sharlet Salina M.D.   On:  02/12/2021 21:21   ECHOCARDIOGRAM COMPLETE  Result Date: 02/13/2021    ECHOCARDIOGRAM REPORT   Patient Name:   Nicole [name] Date of Exam: 02/13/2021 Medical Rec #:  [id number]       Height:       68.0 in Accession #:    [id number]      Weight:       345.7 lb Date of Birth:  20-Dec-1975        BSA:          2.579 m Patient Age:    45 years        BP:           154/90 mmHg Patient Gender: F               HR:           63 bpm. Exam Location:  Inpatient Procedure: 2D Echo, Cardiac Doppler and Color Doppler Indications:    NSTEMI  History:        Patient has no prior history of Echocardiogram examinations.                 CHF; Risk Factors:Hypertension and Diabetes.  Sonographer:    Neomia Dear RDCS Referring Phys: [id number] [place] IMPRESSIONS  1. Left ventricular ejection fraction, by estimation, is 55 to 60%. The left ventricle has normal function. Left ventricular endocardial border not optimally defined to evaluate regional wall motion. There is mild left ventricular hypertrophy. Left ventricular diastolic parameters are consistent with Grade I diastolic dysfunction (impaired relaxation).  2. Right ventricular systolic function is normal. The right ventricular size is mildly enlarged.  3. The mitral valve is normal in structure. No evidence of mitral valve regurgitation. No evidence of mitral stenosis.  4. The aortic valve is normal in structure. Aortic valve regurgitation is not visualized. No aortic stenosis is present.  5. The inferior vena cava is normal in size with greater than 50% respiratory variability, suggesting right atrial pressure of 3 mmHg. FINDINGS  Left Ventricle: Left ventricular ejection fraction, by estimation, is 55 to 60%. The left ventricle has normal function. Left ventricular endocardial border not optimally defined to evaluate regional wall motion. The  left ventricular internal cavity size was normal in size. There is mild left ventricular hypertrophy. Left ventricular diastolic  parameters are consistent with Grade I diastolic dysfunction (impaired relaxation). Right Ventricle: The right ventricular size is mildly enlarged. No increase in right ventricular wall thickness. Right ventricular systolic function is normal. Left Atrium: Left atrial size was normal in size. Right Atrium: Right atrial size was normal in size. Pericardium: There is no evidence of pericardial effusion. Mitral Valve: The mitral valve is normal in structure. No evidence of mitral valve regurgitation. No evidence of mitral valve stenosis. Tricuspid Valve: The tricuspid valve is normal in structure. Tricuspid valve regurgitation is trivial. No evidence of tricuspid stenosis. Aortic Valve: The aortic valve is normal in structure. Aortic valve regurgitation is not visualized. No aortic stenosis is present. Aortic valve mean gradient measures 6.0 mmHg. Aortic valve peak gradient measures 12.5 mmHg. Aortic valve area, by VTI measures 3.53 cm. Pulmonic Valve: The pulmonic valve was normal in structure. Pulmonic valve regurgitation is mild. No evidence of pulmonic stenosis. Aorta: The aortic root is normal in size and structure. Venous: The inferior vena cava is normal in size with greater than 50% respiratory variability, suggesting right atrial pressure of 3 mmHg. IAS/Shunts: No atrial level shunt detected by color flow Doppler.  LEFT VENTRICLE PLAX 2D LVIDd:         5.10 cm      Diastology LVIDs:         3.30 cm      LV e' medial:    8.81 cm/s LV PW:         1.50 cm      LV E/e' medial:  7.6 LV IVS:        1.20 cm      LV e' lateral:   10.40 cm/s LVOT diam:     2.50 cm      LV E/e' lateral: 6.5 LV SV:         128 LV SV Index:   50 LVOT Area:     4.91 cm  LV Volumes (MOD) LV vol d, MOD A2C: 140.0 ml LV vol d, MOD A4C: 76.4 ml LV vol s, MOD A2C: 64.2 ml LV vol s, MOD A4C: 56.4 ml LV SV MOD A2C:     75.8 ml LV SV MOD A4C:     76.4 ml LV SV MOD BP:      43.7 ml RIGHT VENTRICLE RV Basal diam:  4.10 cm RV Mid diam:    4.00 cm  RV S prime:     16.10 cm/s TAPSE (M-mode): 2.4 cm LEFT ATRIUM             Index       RIGHT ATRIUM           Index LA diam:        3.80 cm 1.47 cm/m  RA Area:     13.90 cm LA Vol (A2C):   52.6 ml 20.40 ml/m RA Volume:   30.10 ml  11.67 ml/m LA Vol (A4C):   59.0 ml 22.88 ml/m LA Biplane Vol: 55.5 ml 21.52 ml/m  AORTIC VALVE                    PULMONIC VALVE AV Area (Vmax):    3.69 cm     PV Vmax:       1.07 m/s AV Area (Vmean):   3.72 cm     PV Vmean:  70.800 cm/s AV Area (VTI):     3.53 cm     PV VTI:        0.200 m AV Vmax:           177.00 cm/s  PV Peak grad:  4.6 mmHg AV Vmean:          112.000 cm/s PV Mean grad:  2.0 mmHg AV VTI:            0.363 m AV Peak Grad:      12.5 mmHg AV Mean Grad:      6.0 mmHg LVOT Vmax:         133.00 cm/s LVOT Vmean:        84.900 cm/s LVOT VTI:          0.261 m LVOT/AV VTI ratio: 0.72  AORTA Ao Root diam: 3.60 cm Ao Asc diam:  2.90 cm MITRAL VALVE MV Area (PHT): 3.08 cm    SHUNTS MV Decel Time: 246 msec    Systemic VTI:  0.26 m MV E velocity: 67.10 cm/s  Systemic Diam: 2.50 cm MV A velocity: 82.30 cm/s MV E/A ratio:  0.82 Donato Schultz MD Electronically signed by Donato Schultz MD Signature Date/Time: 02/13/2021/1:58:10 PM    Final     Cardiac Studies  TTE 02/14/2020  1. Left ventricular ejection fraction, by estimation, is 55 to 60%. The  left ventricle has normal function. Left ventricular endocardial border  not optimally defined to evaluate regional wall motion. There is mild left  ventricular hypertrophy. Left  ventricular diastolic parameters are consistent with Grade I diastolic  dysfunction (impaired relaxation).   2. Right ventricular systolic function is normal. The right ventricular  size is mildly enlarged.   3. The mitral valve is normal in structure. No evidence of mitral valve  regurgitation. No evidence of mitral stenosis.   4. The aortic valve is normal in structure. Aortic valve regurgitation is  not visualized. No aortic stenosis is present.    5. The inferior vena cava is normal in size with greater than 50%  respiratory variability, suggesting right atrial pressure of 3 mmHg.   CT PE 02/13/2021 IMPRESSION: 1. Limited motion degraded scan. No evidence of pulmonary embolism. 2. Mild cardiomegaly. Stable trace pericardial effusion. 3. Dilated main pulmonary artery, suggesting pulmonary arterial hypertension. 4. Small dependent right pleural effusion. Mild to moderate passive atelectasis in the dependent lower lobes, right greater than left. 5. Chronic nonspecific mild mediastinal lymphadenopathy, not substantially changed since 2017 chest CT, probably reactive. 6. Aortic Atherosclerosis (ICD10-I70.0).  Patient Profile  45 year old female with hypertension, diabetes, morbid obesity who was admitted on 02/11/2021 for acute diverticulitis.  Course complicated by E. coli bacteremia.  Cardiology consulted on 02/12/2021 for atypical right-sided chest pain and elevated troponin.  Assessment & Plan   Noncardiac chest pain/minimally elevated troponin -She developed acute right-sided chest pain that was worse with inspiration on 02/12/2021 in the middle of the night.  She is currently being treated in the hospital for acute diverticulitis.  She is morbidly obese with CVD risk factors include likely untreated sleep apnea as well as diabetes. -Her troponin elevation was minimal and flat.  EKG completely nonischemic.  Echocardiogram shows normal LV function with no regional wall motion abnormalities.  She has normal RV function. -We pursued CT PE study and this was negative. -That scan does show a small right pleural effusion. -Her echocardiogram has normal tissue Dopplers.  Her diastolic function is normal. -Overall I think her troponin elevation  is likely related to sepsis.  Initial blood cultures were positive for E. coli.  Blood cultures have cleared.  She may have microvascular disease in the setting of diabetes but no evidence that she has  obstructive epicardial stenoses.  I have stopped her heparin.  We will continue aspirin and statin.  She has no further symptoms of chest pain.  Echocardiogram is normal.  This is reassuring.  We will plan to see her in the outpatient setting.  I would like to likely pursue a coronary CTA once she has healed up from her diverticulitis.  I see no need for further testing.    2.  Diabetes -Continue aspirin and statin.  LDL goal less than 70.  3.  Right pleural effusion -Normal diastolic parameters.  Normal RVSP.  She does not have evidence of diastolic dysfunction.  She is nearly 7 L positive this admission for sepsis.  I will give her 1 dose of IV Lasix as I think this will help.  I suspect this is iatrogenic volume overload.  Hospital medicine is also covering for pneumonia.  She is morbidly obese with sleep apnea.  She snores.  She may have aspirated as well.  4.  Untreated sleep apnea -We will plan to pursue a home sleep study.  I will arrange this when I see her in follow-up.  [facility] will sign off.   Medication Recommendations: Aspirin and statin as above. Other recommendations (labs, testing, etc): None Follow up as an outpatient: We will arrange hospital follow-up with me in 6 to 8 weeks.  For questions or updates, please contact [facility] Please consult [email or web address] for contact info under   Time Spent with Patient: I have spent a total of 35 minutes with patient reviewing hospital notes, telemetry, EKGs, labs and examining the patient as well as establishing an assessment and plan that was discussed with the patient.  > 50% of time was spent in direct patient care.    Signed, Lenna Gilford. Flora Lipps, MD, [facility] [place]  [facility]  02/14/2021 9:46 AM

## 2021-02-14 NOTE — Progress Notes (Signed)
PROGRESS NOTE    NICHOLE NEYER  MNO:[id number] DOB: Dec 27, 1975 DOA: 02/10/2021 PCP: Hoy Register, MD     Brief Narrative:  AHNA KONKLE is a 45 year old female with past medical history significant for diabetes who presented with complaints of right upper quadrant abdominal pain.  Work-up in the emergency department revealed acute sigmoid diverticulitis, gallbladder appeared unremarkable without intra or extrahepatic duct dilation.  Patient was started on Rocephin and IV Flagyl.  Patient was found to have E. coli bacteremia as well.  On 7/18, patient developed right-sided chest pain, shortness of breath yesterday evening, troponin was elevated into the 200s.  Cardiology was consulted.  Patient underwent CTA chest which was negative for PE, showed small right-sided pleural effusion.  Echocardiogram largely unremarkable with grade 1 diastolic dysfunction seen.  Troponin elevation thought to be secondary to sepsis pathology.  New events last 24 hours / Subjective: Patient states that she did not sleep much last night, could not get comfortable.  Denies any further chest pain.  Continues to have right-sided abdominal pain.  Having nonproductive cough.  Assessment & Plan:   Active Problems:   Sigmoid diverticulitis  Sepsis secondary to sigmoid diverticulitis with E. coli bacteremia -Sepsis present on admission with tachycardia, tachypnea and leukocytosis present -Lactic acid 2.7 --> 3 -Blood cultures with pansensitive E. coli, continue Ancef -Repeat blood cultures pending -Leukocytosis resolved -She will eventually need colonoscopy as an outpatient, never had one before -Advance diet today  Right pleural effusion -Patient given 1 dose of IV Lasix today.  We will also add azithromycin to cover for CAP  Uncontrolled diabetes with hyperglycemia -Hemoglobin A1c 10 -Hold home Amaryl -Continue Lantus, sliding scale insulin  Chest pain with elevated troponin -Troponin 268 --> 278 -->  310 -Demand ischemia thought to be secondary to sepsis -Echocardiogram showed EF 55 to 60%, grade 1 dd -CTA chest negative for PE -Aspirin, Lipitor -Stop IV heparin -Follow-up with cardiology as outpatient for coronary CTA and sleep study  Hypertension -Cozaar  Hypokalemia -Replace, trend   DVT prophylaxis:  SCDs Start: 02/11/21 0324  Code Status:     Code Status Orders  (From admission, onward)           Start     Ordered   02/11/21 0325  Full code  Continuous        02/11/21 0324           Code Status History     Date Active Date Inactive Code Status Order ID Comments User Context   03/25/2014 1634 03/26/2014 1321 Full Code [id number]  Ashok Norris, NP ED      Family Communication: none at bedside Disposition Plan:  Status is: Inpatient  Remains inpatient appropriate because:Inpatient level of care appropriate due to severity of illness  Dispo:  Patient From: Home  Planned Disposition: Home  Medically stable for discharge: No        Antimicrobials:  Anti-infectives (From admission, onward)    Start     Dose/Rate Route Frequency Ordered Stop   02/14/21 0600  ceFAZolin (ANCEF) IVPB 2g/100 mL premix        2 g 200 mL/hr over 30 Minutes Intravenous Every 8 hours 02/13/21 1407     02/13/21 1830  azithromycin (ZITHROMAX) tablet 500 mg        500 mg Oral Daily 02/13/21 1741     02/12/21 0600  cefTRIAXone (ROCEPHIN) 2 g in sodium chloride 0.9 % 100 mL IVPB  Status:  Discontinued  2 g 200 mL/hr over 30 Minutes Intravenous Daily 02/11/21 0326 02/13/21 1407   02/11/21 1400  metroNIDAZOLE (FLAGYL) IVPB 500 mg  Status:  Discontinued        500 mg 100 mL/hr over 60 Minutes Intravenous Every 8 hours 02/11/21 0326 02/12/21 1331   02/11/21 0300  cefTRIAXone (ROCEPHIN) 2 g in sodium chloride 0.9 % 100 mL IVPB       See Hyperspace for full Linked Orders Report.   2 g 200 mL/hr over 30 Minutes Intravenous  Once 02/11/21 0248 02/11/21 0400   02/11/21  0300  metroNIDAZOLE (FLAGYL) IVPB 500 mg       See Hyperspace for full Linked Orders Report.   500 mg 100 mL/hr over 60 Minutes Intravenous  Once 02/11/21 0248 02/11/21 0501        Objective: Vitals:   02/13/21 1741 02/13/21 2343 02/14/21 0524 02/14/21 0923  BP: (!) 146/72 (!) 180/85 (!) 155/76 (!) 158/98  Pulse: 70 70 84 85  Resp: Temp: 98.5 F (36.9 C) 99.3 F (37.4 C) 99.6 F (37.6 C) 97.7 F (36.5 C)  TempSrc: Oral Oral Oral Oral  SpO2: 98% 96% 94% 95%  Weight:      Height:        Intake/Output Summary (Last 24 hours) at 02/14/2021 1046 Last data filed at 02/14/2021 1610 Gross per 24 hour  Intake 3210.09 ml  Output 500 ml  Net 2710.09 ml    Filed Weights   02/10/21 1840  Weight: (!) 156.8 kg    Examination: General exam: Appears calm and fatigued appearing Respiratory system: Clear to auscultation. Respiratory effort normal.  On room air Cardiovascular system: S1 & S2 heard, RRR. No pedal edema. Gastrointestinal system: Abdomen is nondistended, soft and tender to palpation right hemiabdomen Central nervous system: Alert and oriented. Non focal exam. Speech clear  Extremities: Symmetric in appearance bilaterally  Skin: No rashes, lesions or ulcers on exposed skin  Psychiatry: Judgement and insight appear stable. Mood & affect appropriate.    Data Reviewed: I have personally reviewed following labs and imaging studies  CBC: Recent Labs  Lab 02/10/21 1845 02/11/21 0353 02/12/21 0347 02/13/21 0111 02/14/21 0156  WBC 13.8* 17.8* 14.6* 9.8 7.0  NEUTROABS 11.8*  --   --   --   --   HGB 13.0 13.6 12.6 11.8* 11.2*  HCT 40.7 42.3 39.5 37.7 35.4*  MCV 84.3 84.1 84.4 84.7 85.3  PLT 210 196 164 132* 144*    Basic Metabolic Panel: Recent Labs  Lab 02/10/21 1845 02/11/21 0353 02/12/21 0347 02/13/21 0111 02/14/21 0156  NA 136  --  133* 133* 133*  K 4.2  --  4.2 3.5 3.3*  CL 104  --  98 98 101  CO2 23  --  GLUCOSE 227*  --  225*  222* 216*  BUN 15  --  CREATININE 1.08* 1.01* 1.14* 0.94 0.79  CALCIUM 9.4  --  9.0 8.5* 8.2*  MG  --  1.6* 2.0  --   --   PHOS  --  3.0  --   --   --     GFR: Estimated Creatinine Clearance: 140.2 mL/min (by C-G formula based on SCr of 0.79 mg/dL). Liver Function Tests: Recent Labs  Lab 02/10/21 1845  AST 40  ALT 40  ALKPHOS 103  BILITOT 0.7  PROT 6.9  ALBUMIN 3.3*    Recent Labs  Lab 02/10/21  1845  LIPASE 31    No results for input(s): AMMONIA in the last 168 hours. Coagulation Profile: No results for input(s): INR, PROTIME in the last 168 hours. Cardiac Enzymes: No results for input(s): CKTOTAL, CKMB, CKMBINDEX, TROPONINI in the last 168 hours. BNP (last 3 results) No results for input(s): PROBNP in the last 8760 hours. HbA1C: No results for input(s): HGBA1C in the last 72 hours.  CBG: Recent Labs  Lab 02/13/21 0638 02/13/21 1131 02/13/21 1622 02/13/21 2210 02/14/21 0638  GLUCAP 288* 240* 188* 186* 207*    Lipid Profile: Recent Labs    02/13/21 0932  CHOL 163  HDL 32*  LDLCALC 105*  TRIG 129  CHOLHDL 5.1    Thyroid Function Tests: No results for input(s): TSH, T4TOTAL, FREET4, T3FREE, THYROIDAB in the last 72 hours. Anemia Panel: No results for input(s): VITAMINB12, FOLATE, FERRITIN, TIBC, IRON, RETICCTPCT in the last 72 hours. Sepsis Labs: Recent Labs  Lab 02/11/21 0310 02/11/21 0353  LATICACIDVEN 2.7* 3.0*     Recent Results (from the past 240 hour(s))  Resp Panel by RT-PCR (Flu A&B, Covid) Nasopharyngeal Swab     Status: None   Collection Time: 02/10/21  8:48 PM   Specimen: Nasopharyngeal Swab; Nasopharyngeal(NP) swabs in vial transport medium  Result Value Ref Range Status   SARS Coronavirus 2 by RT PCR NEGATIVE NEGATIVE Final    Comment: (NOTE) SARS-CoV-2 target nucleic acids are NOT DETECTED.  The SARS-CoV-2 RNA is generally detectable in upper respiratory specimens during the acute phase of infection. The  lowest concentration of SARS-CoV-2 viral copies this assay can detect is 138 copies/mL. A negative result does not preclude SARS-Cov-2 infection and should not be used as the sole basis for treatment or other patient management decisions. A negative result may occur with  improper specimen collection/handling, submission of specimen other than nasopharyngeal swab, presence of viral mutation(s) within the areas targeted by this assay, and inadequate number of viral copies(<138 copies/mL). A negative result must be combined with clinical observations, patient history, and epidemiological information. The expected result is Negative.  Fact Sheet for Patients:  BloggerCourse.com  Fact Sheet for Healthcare Providers:  SeriousBroker.it  This test is no t yet approved or cleared by the [place] FDA and  has been authorized for detection and/or diagnosis of SARS-CoV-2 by FDA under an Emergency Use Authorization (EUA). This EUA will remain  in effect (meaning this test can be used) for the duration of the COVID-19 declaration under Section 564(b)(1) of the Act, 21 U.S.C.section 360bbb-3(b)(1), unless the authorization is terminated  or revoked sooner.       Influenza A by PCR NEGATIVE NEGATIVE Final   Influenza B by PCR NEGATIVE NEGATIVE Final    Comment: (NOTE) The Xpert Xpress SARS-CoV-2/FLU/RSV plus assay is intended as an aid in the diagnosis of influenza from Nasopharyngeal swab specimens and should not be used as a sole basis for treatment. Nasal washings and aspirates are unacceptable for Xpert Xpress SARS-CoV-2/FLU/RSV testing.  Fact Sheet for Patients: BloggerCourse.com  Fact Sheet for Healthcare Providers: SeriousBroker.it  This test is not yet approved or cleared by the [place] FDA and has been authorized for detection and/or diagnosis of SARS-CoV-2 by FDA under  an Emergency Use Authorization (EUA). This EUA will remain in effect (meaning this test can be used) for the duration of the COVID-19 declaration under Section 564(b)(1) of the Act, 21 U.S.C. section 360bbb-3(b)(1), unless the authorization is terminated or revoked.  Performed at [facility]  Lab, [address] [place], [place] [address]   Blood culture (routine x 2)     Status: Abnormal   Collection Time: 02/11/21  3:10 AM   Specimen: BLOOD  Result Value Ref Range Status   Specimen Description BLOOD SITE NOT SPECIFIED  Final   Special Requests   Final    BOTTLES DRAWN AEROBIC AND ANAEROBIC Blood Culture adequate volume   Culture  Setup Time (A)  Final    GRAM VARIABLE ROD IN BOTH AEROBIC AND ANAEROBIC BOTTLES CRITICAL RESULT CALLED TO, READ BACK BY AND VERIFIED WITH: PHARM D K.[facility] ON [id number] AT 2022 BY E.PARRISH Performed at [facility] Lab, [address] [place], [place] [address]    Culture ESCHERICHIA COLI (A)  Final   Report Status 02/13/2021 FINAL  Final   Organism ID, Bacteria ESCHERICHIA COLI  Final      Susceptibility   Escherichia coli - MIC*    AMPICILLIN <=2 SENSITIVE Sensitive     CEFAZOLIN <=4 SENSITIVE Sensitive     CEFEPIME <=0.12 SENSITIVE Sensitive     CEFTAZIDIME <=1 SENSITIVE Sensitive     CEFTRIAXONE <=0.25 SENSITIVE Sensitive     CIPROFLOXACIN <=0.25 SENSITIVE Sensitive     GENTAMICIN <=1 SENSITIVE Sensitive     IMIPENEM <=0.25 SENSITIVE Sensitive     TRIMETH/SULFA <=20 SENSITIVE Sensitive     AMPICILLIN/SULBACTAM <=2 SENSITIVE Sensitive     PIP/TAZO <=4 SENSITIVE Sensitive     * ESCHERICHIA COLI  Blood Culture ID Panel (Reflexed)     Status: Abnormal   Collection Time: 02/11/21  3:10 AM  Result Value Ref Range Status   Enterococcus faecalis NOT DETECTED NOT DETECTED Final   Enterococcus Faecium NOT DETECTED NOT DETECTED Final   Listeria monocytogenes NOT DETECTED NOT DETECTED Final   Staphylococcus species NOT DETECTED NOT DETECTED Final    Staphylococcus aureus (BCID) NOT DETECTED NOT DETECTED Final   Staphylococcus epidermidis NOT DETECTED NOT DETECTED Final   Staphylococcus lugdunensis NOT DETECTED NOT DETECTED Final   Streptococcus species NOT DETECTED NOT DETECTED Final   Streptococcus agalactiae NOT DETECTED NOT DETECTED Final   Streptococcus pneumoniae NOT DETECTED NOT DETECTED Final   Streptococcus pyogenes NOT DETECTED NOT DETECTED Final   A.calcoaceticus-baumannii NOT DETECTED NOT DETECTED Final   Bacteroides fragilis NOT DETECTED NOT DETECTED Final   Enterobacterales DETECTED (A) NOT DETECTED Final    Comment: Enterobacterales represent a large order of gram negative bacteria, not a single organism. CRITICAL RESULT CALLED TO, READ BACK BY AND VERIFIED WITH: PHARM D K.[facility]URTH ON [id number] AT 2022 BY E.PARRISH    Enterobacter cloacae complex NOT DETECTED NOT DETECTED Final   Escherichia coli DETECTED (A) NOT DETECTED Final    Comment: CRITICAL RESULT CALLED TO, READ BACK BY AND VERIFIED WITH: PHARM D K.HURTH ON [id number] AT 2022 BY E.PARRISH    Klebsiella aerogenes NOT DETECTED NOT DETECTED Final   Klebsiella oxytoca NOT DETECTED NOT DETECTED Final   Klebsiella pneumoniae NOT DETECTED NOT DETECTED Final   Proteus species NOT DETECTED NOT DETECTED Final   Salmonella species NOT DETECTED NOT DETECTED Final   Serratia marcescens NOT DETECTED NOT DETECTED Final   Haemophilus influenzae NOT DETECTED NOT DETECTED Final   Neisseria meningitidis NOT DETECTED NOT DETECTED Final   Pseudomonas aeruginosa NOT DETECTED NOT DETECTED Final   Stenotrophomonas maltophilia NOT DETECTED NOT DETECTED Final   Candida albicans NOT DETECTED NOT DETECTED Final   Candida auris NOT DETECTED NOT DETECTED Final   Candida glabrata NOT DETECTED NOT  DETECTED Final   Candida krusei NOT DETECTED NOT DETECTED Final   Candida parapsilosis NOT DETECTED NOT DETECTED Final   Candida tropicalis NOT DETECTED NOT DETECTED Final   Cryptococcus  neoformans/gattii NOT DETECTED NOT DETECTED Final   CTX-M ESBL NOT DETECTED NOT DETECTED Final   Carbapenem resistance IMP NOT DETECTED NOT DETECTED Final   Carbapenem resistance KPC NOT DETECTED NOT DETECTED Final   Carbapenem resistance NDM NOT DETECTED NOT DETECTED Final   Carbapenem resist OXA 48 LIKE NOT DETECTED NOT DETECTED Final   Carbapenem resistance VIM NOT DETECTED NOT DETECTED Final    Comment: Performed at [facility] Lab, [address] [place], [place] [address]  Blood culture (routine x 2)     Status: Abnormal   Collection Time: 02/11/21  3:20 AM   Specimen: BLOOD  Result Value Ref Range Status   Specimen Description BLOOD SITE NOT SPECIFIED  Final   Special Requests   Final    BOTTLES DRAWN AEROBIC AND ANAEROBIC Blood Culture adequate volume   Culture  Setup Time   Final    GRAM NEGATIVE RODS IN BOTH AEROBIC AND ANAEROBIC BOTTLES CRITICAL VALUE NOTED.  VALUE IS CONSISTENT WITH PREVIOUSLY REPORTED AND CALLED VALUE.    Culture (A)  Final    ESCHERICHIA COLI SUSCEPTIBILITIES PERFORMED ON PREVIOUS CULTURE WITHIN THE LAST 5 DAYS. Performed at [facility] Lab, [address], [place], [place] [address]    Report Status 02/13/2021 FINAL  Final  Culture, blood (routine x 2)     Status: None (Preliminary result)   Collection Time: 02/13/21  9:23 AM   Specimen: BLOOD RIGHT HAND  Result Value Ref Range Status   Specimen Description BLOOD RIGHT HAND  Final   Special Requests   Final    BOTTLES DRAWN AEROBIC AND ANAEROBIC Blood Culture adequate volume   Culture   Final    NO GROWTH < 24 HOURS Performed at [facility] Lab, [address] [place], [place] [address]    Report Status PENDING  Incomplete  Culture, blood (routine x 2)     Status: None (Preliminary result)   Collection Time: 02/13/21  9:32 AM   Specimen: BLOOD RIGHT WRIST  Result Value Ref Range Status   Specimen Description BLOOD RIGHT WRIST  Final   Special Requests   Final    BOTTLES DRAWN  AEROBIC AND ANAEROBIC Blood Culture adequate volume   Culture   Final    NO GROWTH < 24 HOURS Performed at [facility] Lab, [address], [place], [place] [address]    Report Status PENDING  Incomplete       Radiology Studies: CT Angio Chest Pulmonary Embolism (PE) W or WO Contrast  Result Date: 02/13/2021 CLINICAL DATA:  Inpatient.  Chest pain and dyspnea. EXAM: CT ANGIOGRAPHY CHEST WITH CONTRAST TECHNIQUE: Multidetector CT imaging of the chest was performed using the standard protocol during bolus administration of intravenous contrast. Multiplanar CT image reconstructions and MIPs were obtained to evaluate the vascular anatomy. CONTRAST:  OMNIPAQUE IOHEXOL 350 MG/ML SOLN COMPARISON:  02/12/2021 chest radiograph. 02/10/2016 chest CT angiogram. FINDINGS: Cardiovascular: The study is low to moderate quality for the evaluation of pulmonary embolism, with degradation due to motion and suboptimal bolus opacification. There are no convincing filling defects in the central, lobar, segmental or subsegmental pulmonary artery branches to suggest acute pulmonary embolism. Minimally atherosclerotic nonaneurysmal thoracic aorta. Dilated main pulmonary artery (4.0 cm diameter). Mild cardiomegaly. Stable trace pericardial effusion. Mediastinum/Nodes: No  discrete thyroid nodules. Unremarkable esophagus. No axillary adenopathy. Several mildly enlarged high left prevascular lymph nodes, largest 1.2 cm short axis diameter (series 7/image 82), not substantially changed since 02/10/2016 chest CT. Mildly enlarged 1.1 cm high left paratracheal node (series 7/image 67), not substantially changed. Mildly enlarged right prevascular nodes up to 1.0 cm (series 5/image 47), not substantially changed. No new pathologically enlarged mediastinal nodes. No pathologically enlarged hilar nodes. Lungs/Pleura: No pneumothorax. Small dependent right pleural effusion. No left pleural effusion. Mild to moderate passive  atelectasis in the dependent lower lobes, right greater than left. No lung masses or central airway stenoses. No significant pulmonary nodules. Upper abdomen: Left colonic diverticulosis. Musculoskeletal: No aggressive appearing focal osseous lesions. Moderate thoracic spondylosis. Review of the MIP images confirms the above findings. IMPRESSION: 1. Limited motion degraded scan. No evidence of pulmonary embolism. 2. Mild cardiomegaly. Stable trace pericardial effusion. 3. Dilated main pulmonary artery, suggesting pulmonary arterial hypertension. 4. Small dependent right pleural effusion. Mild to moderate passive atelectasis in the dependent lower lobes, right greater than left. 5. Chronic nonspecific mild mediastinal lymphadenopathy, not substantially changed since 2017 chest CT, probably reactive. 6. Aortic Atherosclerosis (ICD10-I70.0). Electronically Signed   By: Delbert Phenix M.D.   On: 02/13/2021 11:12   DG CHEST PORT 1 VIEW  Result Date: 02/12/2021 CLINICAL DATA:  Tachypnea, chest pressure EXAM: PORTABLE CHEST 1 VIEW COMPARISON:  05/11/2020 FINDINGS: Single frontal view of the chest demonstrates mild enlargement the cardiac silhouette, likely accentuated by portable AP positioning. No airspace disease, effusion, or pneumothorax. No acute bony abnormalities. IMPRESSION: 1. No acute intrathoracic process. Electronically Signed   By: Sharlet Salina M.D.   On: 02/12/2021 21:21   ECHOCARDIOGRAM COMPLETE  Result Date: 02/13/2021    ECHOCARDIOGRAM REPORT   Patient Name:   TENAYA HILYER Date of Exam: 02/13/2021 Medical Rec #:  [id number]       Height:       68.0 in Accession #:    [id number]      Weight:       345.7 lb Date of Birth:  Aug 19, 1975        BSA:          2.579 m Patient Age:    45 years        BP:           154/90 mmHg Patient Gender: F               HR:           63 bpm. Exam Location:  Inpatient Procedure: 2D Echo, Cardiac Doppler and Color Doppler Indications:    NSTEMI  History:        Patient  has no prior history of Echocardiogram examinations.                 CHF; Risk Factors:Hypertension and Diabetes.  Sonographer:    Neomia Dear RDCS Referring Phys: [id number] [place] IMPRESSIONS  1. Left ventricular ejection fraction, by estimation, is 55 to 60%. The left ventricle has normal function. Left ventricular endocardial border not optimally defined to evaluate regional wall motion. There is mild left ventricular hypertrophy. Left ventricular diastolic parameters are consistent with Grade I diastolic dysfunction (impaired relaxation).  2. Right ventricular systolic function is normal. The right ventricular size is mildly enlarged.  3. The mitral valve is normal in structure. No evidence of mitral valve regurgitation. No evidence of mitral stenosis.  4. The aortic valve is normal in structure. Aortic valve  regurgitation is not visualized. No aortic stenosis is present.  5. The inferior vena cava is normal in size with greater than 50% respiratory variability, suggesting right atrial pressure of 3 mmHg. FINDINGS  Left Ventricle: Left ventricular ejection fraction, by estimation, is 55 to 60%. The left ventricle has normal function. Left ventricular endocardial border not optimally defined to evaluate regional wall motion. The left ventricular internal cavity size was normal in size. There is mild left ventricular hypertrophy. Left ventricular diastolic parameters are consistent with Grade I diastolic dysfunction (impaired relaxation). Right Ventricle: The right ventricular size is mildly enlarged. No increase in right ventricular wall thickness. Right ventricular systolic function is normal. Left Atrium: Left atrial size was normal in size. Right Atrium: Right atrial size was normal in size. Pericardium: There is no evidence of pericardial effusion. Mitral Valve: The mitral valve is normal in structure. No evidence of mitral valve regurgitation. No evidence of mitral valve stenosis. Tricuspid Valve:  The tricuspid valve is normal in structure. Tricuspid valve regurgitation is trivial. No evidence of tricuspid stenosis. Aortic Valve: The aortic valve is normal in structure. Aortic valve regurgitation is not visualized. No aortic stenosis is present. Aortic valve mean gradient measures 6.0 mmHg. Aortic valve peak gradient measures 12.5 mmHg. Aortic valve area, by VTI measures 3.53 cm. Pulmonic Valve: The pulmonic valve was normal in structure. Pulmonic valve regurgitation is mild. No evidence of pulmonic stenosis. Aorta: The aortic root is normal in size and structure. Venous: The inferior vena cava is normal in size with greater than 50% respiratory variability, suggesting right atrial pressure of 3 mmHg. IAS/Shunts: No atrial level shunt detected by color flow Doppler.  LEFT VENTRICLE PLAX 2D LVIDd:         5.10 cm      Diastology LVIDs:         3.30 cm      LV e' medial:    8.81 cm/s LV PW:         1.50 cm      LV E/e' medial:  7.6 LV IVS:        1.20 cm      LV e' lateral:   10.40 cm/s LVOT diam:     2.50 cm      LV E/e' lateral: 6.5 LV SV:         128 LV SV Index:   50 LVOT Area:     4.91 cm  LV Volumes (MOD) LV vol d, MOD A2C: 140.0 ml LV vol d, MOD A4C: 76.4 ml LV vol s, MOD A2C: 64.2 ml LV vol s, MOD A4C: 56.4 ml LV SV MOD A2C:     75.8 ml LV SV MOD A4C:     76.4 ml LV SV MOD BP:      43.7 ml RIGHT VENTRICLE RV Basal diam:  4.10 cm RV Mid diam:    4.00 cm RV S prime:     16.10 cm/s TAPSE (M-mode): 2.4 cm LEFT ATRIUM             Index       RIGHT ATRIUM           Index LA diam:        3.80 cm 1.47 cm/m  RA Area:     13.90 cm LA Vol (A2C):   52.6 ml 20.40 ml/m RA Volume:   30.10 ml  11.67 ml/m LA Vol (A4C):   59.0 ml 22.88 ml/m LA Biplane Vol: 55.5 ml 21.52 ml/m  AORTIC VALVE                    PULMONIC VALVE AV Area (Vmax):    3.69 cm     PV Vmax:       1.07 m/s AV Area (Vmean):   3.72 cm     PV Vmean:      70.800 cm/s AV Area (VTI):     3.53 cm     PV VTI:        0.200 m AV Vmax:            177.00 cm/s  PV Peak grad:  4.6 mmHg AV Vmean:          112.000 cm/s PV Mean grad:  2.0 mmHg AV VTI:            0.363 m AV Peak Grad:      12.5 mmHg AV Mean Grad:      6.0 mmHg LVOT Vmax:         133.00 cm/s LVOT Vmean:        84.900 cm/s LVOT VTI:          0.261 m LVOT/AV VTI ratio: 0.72  AORTA Ao Root diam: 3.60 cm Ao Asc diam:  2.90 cm MITRAL VALVE MV Area (PHT): 3.08 cm    SHUNTS MV Decel Time: 246 msec    Systemic VTI:  0.26 m MV E velocity: 67.10 cm/s  Systemic Diam: 2.50 cm MV A velocity: 82.30 cm/s MV E/A ratio:  0.82 Donato SchultzMark Skains MD Electronically signed by Donato SchultzMark Skains MD Signature Date/Time: 02/13/2021/1:58:10 PM    Final       Scheduled Meds:  aspirin  81 mg Oral Daily   atorvastatin  80 mg Oral Daily   azithromycin  500 mg Oral Daily   furosemide  20 mg Intravenous Once   insulin aspart  0-15 Units Subcutaneous TID WC   [START ON 02/15/2021] insulin glargine  20 Units Subcutaneous Daily   losartan  25 mg Oral Daily   potassium chloride  40 mEq Oral Once   senna  1 tablet Oral Daily   Continuous Infusions:   ceFAZolin (ANCEF) IV 2 g (02/14/21 0622)   promethazine (PHENERGAN) injection (IM or IVPB)       LOS: 3 days      Time spent: 25 minutes   Noralee StainJennifer Alexus Michael, DO [facility] 02/14/2021, 10:46 AM   Available via Epic secure chat 7am-7pm After these hours, please refer to coverage provider listed on [email or web address]

## 2021-02-15 DIAGNOSIS — B9620 Unspecified Escherichia coli [E. coli] as the cause of diseases classified elsewhere: Secondary | ICD-10-CM

## 2021-02-15 DIAGNOSIS — R778 Other specified abnormalities of plasma proteins: Secondary | ICD-10-CM

## 2021-02-15 DIAGNOSIS — E1165 Type 2 diabetes mellitus with hyperglycemia: Secondary | ICD-10-CM

## 2021-02-15 DIAGNOSIS — R7881 Bacteremia: Secondary | ICD-10-CM

## 2021-02-15 LAB — BASIC METABOLIC PANEL
Anion gap: 7 (ref 5–15)
BUN: 7 mg/dL (ref 6–20)
CO2: 31 mmol/L (ref 22–32)
Calcium: 8.7 mg/dL — ABNORMAL LOW (ref 8.9–10.3)
Chloride: 100 mmol/L (ref 98–111)
Creatinine, Ser: 0.97 mg/dL (ref 0.44–1.00)
GFR, Estimated: >60 mL/min (ref >60)
Glucose, Bld: 176 mg/dL — ABNORMAL HIGH (ref 70–99)
Potassium: 3.4 mmol/L — ABNORMAL LOW (ref 3.5–5.1)
Sodium: 138 mmol/L (ref 135–145)

## 2021-02-15 LAB — MAGNESIUM: Magnesium: 1.9 mg/dL (ref 1.7–2.4)

## 2021-02-15 LAB — GLUCOSE, CAPILLARY
Glucose-Capillary: 186 mg/dL — ABNORMAL HIGH (ref 70–99)
Glucose-Capillary: 201 mg/dL — ABNORMAL HIGH (ref 70–99)
Glucose-Capillary: 194 mg/dL — ABNORMAL HIGH (ref 70–99)

## 2021-02-15 MED ORDER — BLOOD GLUCOSE MONITOR KIT: PACK | 0 refills | Status: DC

## 2021-02-15 MED ORDER — ATORVASTATIN CALCIUM 80 MG PO TABS: 80.0000 mg | ORAL_TABLET | Freq: Every day | ORAL | 0 refills | Status: DC

## 2021-02-15 MED ORDER — INSULIN GLARGINE 100 UNIT/ML SOLOSTAR PEN: 20.0000 [IU] | PEN_INJECTOR | Freq: Every day | SUBCUTANEOUS | 0 refills | Status: DC

## 2021-02-15 MED ORDER — PEN NEEDLES 32G X 4 MM MISC: 1.0000 | Freq: Every day | 0 refills | Status: AC

## 2021-02-15 MED ORDER — HYDROCODONE-ACETAMINOPHEN 5-325 MG PO TABS: 1.0000 | ORAL_TABLET | ORAL | 0 refills | Status: DC | PRN

## 2021-02-15 MED ORDER — LOSARTAN POTASSIUM 25 MG PO TABS: 25.0000 mg | ORAL_TABLET | Freq: Every day | ORAL | 0 refills | Status: DC

## 2021-02-15 MED ORDER — CEPHALEXIN 250 MG PO CAPS: 250.0000 mg | ORAL_CAPSULE | Freq: Four times a day (QID) | ORAL | 0 refills | Status: AC

## 2021-02-15 MED ORDER — POTASSIUM CHLORIDE CRYS ER 20 MEQ PO TBCR
20.0000 meq | EXTENDED_RELEASE_TABLET | Freq: Once | ORAL | Status: AC
  Administered 2021-02-15: 20 meq via ORAL
  Filled 2021-02-15: qty 1

## 2021-02-15 NOTE — Progress Notes (Signed)
Pt shown how to check her blood sugar. Did will with return demonstration. No questions on how to check her sugar at home.   Lajoyce Tamura, RN

## 2021-02-15 NOTE — Discharge Summary (Signed)
Physician Discharge Summary  KIMYAH FREIN NUU:[id number] DOB: 12/22/1975 DOA: 02/10/2021  PCP: [name], MD  Admit date: 02/10/2021 Discharge date: 02/15/2021  Admitted From: Home Disposition: Home  Recommendations for Outpatient Follow-up:  Follow up with PCP in 1-2 weeks Please obtain BMP/CBC in one week  Discharge Condition: Stable CODE STATUS: Full Diet recommendation: Diabetic diet as tolerated  Brief/Interim Summary: NEHA WAIGHT is a 45 year old female with past medical history significant for diabetes who presented with complaints of right upper quadrant abdominal pain.  Work-up in the emergency department revealed acute sigmoid diverticulitis, gallbladder appeared unremarkable without intra or extrahepatic duct dilation.  Patient was started on Rocephin and IV Flagyl.  Patient was found to have E. coli bacteremia as well.  On 7/18, patient developed right-sided chest pain, shortness of breath yesterday evening, troponin was elevated into the 200s.  Cardiology was consulted.  Patient underwent CTA chest which was negative for PE, showed small right-sided pleural effusion.  Echocardiogram largely unremarkable with grade 1 diastolic dysfunction seen.  Troponin elevation thought to be secondary to sepsis pathology.  Patient admitted as above with sepsis secondary to sigmoid diverticulitis and notable E. coli bacteremia likely in the setting of translocation from the colon.  Patient also had profoundly uncontrolled diabetes with right pleural effusion in the setting of heart failure exacerbation.  Patient diuresed quite well, antibiotics were de-escalated to Ancef transition to cephalexin at discharge for additional 10 days of coverage.  Patient states she feels quite well tolerating p.o. denies any further shortness of breath chest pain dyspnea with exertion nausea vomiting diarrhea or constipation.  Patient be discharged on new insulin given profoundly elevated A1c at 10.  Patient  will be discharged on Lantus once daily with new insulin pen, glucometer and home glucose kit including lancets and test strips.  Patient will need very close follow-up with PCP in the next week for further evaluation and testing to ensure her home glucose levels are more well controlled than previous.  Lengthy discussion at bedside daily about need for medication compliance and dietary and lifestyle changes.  Incidentally patient had minimally elevated troponin during hospital stay, cardiology evaluated, likely in the setting of sepsis and bacteremia as above no further work-up warranted in-house, recommending outpatient follow-up for coronary CTA and sleep study.  Discharge Diagnoses:  Active Problems:   Sigmoid diverticulitis    Discharge Instructions  Discharge Instructions     Call MD for:  persistant nausea and vomiting   Complete by: As directed    Call MD for:  severe uncontrolled pain   Complete by: As directed    Call MD for:  temperature >100.4   Complete by: As directed    Diet Carb Modified   Complete by: As directed    Increase activity slowly   Complete by: As directed       Allergies as of 02/15/2021   No Known Allergies      Medication List     STOP taking these medications    Accu-Chek Aviva device   Accu-Chek Softclix Lancet Dev Kit   glucose blood test strip Commonly known as: Accu-Chek Aviva   lidocaine 2 % solution Commonly known as: XYLOCAINE   meloxicam 7.5 MG tablet Commonly known as: MOBIC   Vitamin D (Ergocalciferol) 1.25 MG (50000 UNIT) Caps capsule Commonly known as: DRISDOL       TAKE these medications    albuterol 108 (90 Base) MCG/ACT inhaler Commonly known as: VENTOLIN HFA Inhale 2 puffs  into the lungs every 6 (six) hours as needed for wheezing or shortness of breath.   atorvastatin 80 MG tablet Commonly known as: LIPITOR Take 1 tablet (80 mg total) by mouth daily. Start taking on: February 16, 2021   blood glucose meter kit  and supplies Kit Dispense based on patient and insurance preference. Use up to four times daily as directed.   cephALEXin 250 MG capsule Commonly known as: KEFLEX Take 1 capsule (250 mg total) by mouth 4 (four) times daily for 10 days.   glimepiride 2 MG tablet Commonly known as: AMARYL Take 1 tablet (2 mg total) by mouth daily before breakfast.   HYDROcodone-acetaminophen 5-325 MG tablet Commonly known as: NORCO/VICODIN Take 1 tablet by mouth every 4 (four) hours as needed for moderate pain.   hydrOXYzine 10 MG tablet Commonly known as: ATARAX/VISTARIL Take 1 tablet (10 mg total) by mouth 2 (two) times daily as needed. What changed: reasons to take this   insulin glargine 100 UNIT/ML Solostar Pen Commonly known as: LANTUS Inject 20 Units into the skin daily.   losartan 25 MG tablet Commonly known as: COZAAR Take 1 tablet (25 mg total) by mouth daily. Start taking on: February 16, 2021   Pen Needles 32G X 4 MM Misc 1 each by Does not apply route daily.   Spiriva HandiHaler 18 MCG inhalation capsule Generic drug: tiotropium Place 1 capsule (18 mcg total) into inhaler and inhale daily.        Follow-up Information     O'Neal, Cassie Freer, MD Follow up.   Specialties: Cardiology, Internal Medicine, Radiology Why: Dr. Debbe Mounts nurse to reach out to you to arrange follow up, please give [place] a call if you do not hear from [place] in 3 business days. Contact information: [place] [address] [phone]                No Known Allergies  Consultations: Cardiology  Procedures/Studies: CT Angio Chest Pulmonary Embolism (PE) W or WO Contrast  Result Date: 02/13/2021 CLINICAL DATA:  Inpatient.  Chest pain and dyspnea. EXAM: CT ANGIOGRAPHY CHEST WITH CONTRAST TECHNIQUE: Multidetector CT imaging of the chest was performed using the standard protocol during bolus administration of intravenous contrast. Multiplanar CT image reconstructions and MIPs were  obtained to evaluate the vascular anatomy. CONTRAST:  192m OMNIPAQUE IOHEXOL 350 MG/ML SOLN COMPARISON:  02/12/2021 chest radiograph. 02/10/2016 chest CT angiogram. FINDINGS: Cardiovascular: The study is low to moderate quality for the evaluation of pulmonary embolism, with degradation due to motion and suboptimal bolus opacification. There are no convincing filling defects in the central, lobar, segmental or subsegmental pulmonary artery branches to suggest acute pulmonary embolism. Minimally atherosclerotic nonaneurysmal thoracic aorta. Dilated main pulmonary artery (4.0 cm diameter). Mild cardiomegaly. Stable trace pericardial effusion. Mediastinum/Nodes: No discrete thyroid nodules. Unremarkable esophagus. No axillary adenopathy. Several mildly enlarged high left prevascular lymph nodes, largest 1.2 cm short axis diameter (series 7/image 82), not substantially changed since 02/10/2016 chest CT. Mildly enlarged 1.1 cm high left paratracheal node (series 7/image 67), not substantially changed. Mildly enlarged right prevascular nodes up to 1.0 cm (series 5/image 47), not substantially changed. No new pathologically enlarged mediastinal nodes. No pathologically enlarged hilar nodes. Lungs/Pleura: No pneumothorax. Small dependent right pleural effusion. No left pleural effusion. Mild to moderate passive atelectasis in the dependent lower lobes, right greater than left. No lung masses or central airway stenoses. No significant pulmonary nodules. Upper abdomen: Left colonic diverticulosis. Musculoskeletal: No aggressive appearing focal osseous lesions.  Moderate thoracic spondylosis. Review of the MIP images confirms the above findings. IMPRESSION: 1. Limited motion degraded scan. No evidence of pulmonary embolism. 2. Mild cardiomegaly. Stable trace pericardial effusion. 3. Dilated main pulmonary artery, suggesting pulmonary arterial hypertension. 4. Small dependent right pleural effusion. Mild to moderate passive  atelectasis in the dependent lower lobes, right greater than left. 5. Chronic nonspecific mild mediastinal lymphadenopathy, not substantially changed since 2017 chest CT, probably reactive. 6. Aortic Atherosclerosis (ICD10-I70.0). Electronically Signed   By: Ilona Sorrel M.D.   On: 02/13/2021 11:12   CT Abdomen Pelvis W Contrast  Result Date: 02/11/2021 CLINICAL DATA:  Acute abdominal pain EXAM: CT ABDOMEN AND PELVIS WITH CONTRAST TECHNIQUE: Multidetector CT imaging of the abdomen and pelvis was performed using the standard protocol following bolus administration of intravenous contrast. CONTRAST:  164m OMNIPAQUE IOHEXOL 300 MG/ML  SOLN COMPARISON:  Right upper quadrant ultrasound dated 02/10/2021. CT abdomen/pelvis dated 03/25/2014. FINDINGS: Lower chest: Lung bases are clear. Hepatobiliary: Liver is within normal limits. Gallbladder is unremarkable. No intrahepatic or extrahepatic ductal dilatation. Pancreas: Within normal limits. Spleen: Within normal limits. Adrenals/Urinary Tract: Adrenal glands are within normal limits. Kidneys are within normal limits.  No hydronephrosis. Bladder is within normal limits. Stomach/Bowel: Stomach is within normal limits. No evidence of bowel obstruction. Prior appendectomy. Sigmoid diverticulosis with mild pericolonic inflammatory changes in the left lower quadrant (coronal image 40), suggesting mild sigmoid diverticulitis. No drainable fluid collection/abscess. No free air to suggest macroscopic perforation. Vascular/Lymphatic: No evidence of abdominal aortic aneurysm. Atherosclerotic calcifications of the abdominal aorta and branch vessels. No suspicious abdominopelvic lymphadenopathy. Reproductive: Heterogeneous uterus, suggesting uterine fibroids. 2.0 cm fat density left ovarian lesion (series 3/image 69), suggesting a left ovarian dermoid, grossly unchanged. No right adnexal mass. Other: No abdominopelvic ascites. Musculoskeletal: Degenerative changes of the visualized  thoracolumbar spine. IMPRESSION: Mild sigmoid diverticulitis. No drainable fluid collection/abscess. No free air. 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 Electronically Signed   By: SJulian HyM.D.   On: 02/11/2021 01:21   DG CHEST PORT 1 VIEW  Result Date: 02/12/2021 CLINICAL DATA:  Tachypnea, chest pressure EXAM: PORTABLE CHEST 1 VIEW COMPARISON:  05/11/2020 FINDINGS: Single frontal view of the chest demonstrates mild enlargement the cardiac silhouette, likely accentuated by portable AP positioning. No airspace disease, effusion, or pneumothorax. No acute bony abnormalities. IMPRESSION: 1. No acute intrathoracic process. Electronically Signed   By: MRanda NgoM.D.   On: 02/12/2021 21:21   ECHOCARDIOGRAM COMPLETE  Result Date: 02/13/2021    ECHOCARDIOGRAM REPORT   Patient Name:   CSUZI HERNANDate of Exam: 02/13/2021 Medical Rec #:  [id number]      Height:       68.0 in Accession #:    [id number]     Weight:       345.7 lb Date of Birth:  [birth year]       BSA:          2.579 m Patient Age:    89years        BP:           154/90 mmHg Patient Gender: F               HR:           63 bpm. Exam Location:  Inpatient Procedure: 2D Echo, Cardiac Doppler and Color Doppler Indications:    NSTEMI  History:        Patient has no prior history of Echocardiogram examinations.  CHF; Risk Factors:Hypertension and Diabetes.  Sonographer:    Luisa Hart RDCS Referring Phys: [id number] [place] IMPRESSIONS  1. Left ventricular ejection fraction, by estimation, is 55 to 60%. The left ventricle has normal function. Left ventricular endocardial border not optimally defined to evaluate regional wall motion. There is mild left ventricular hypertrophy. Left ventricular diastolic parameters are consistent with Grade I diastolic dysfunction (impaired relaxation).  2. Right ventricular systolic function is normal. The right ventricular size is mildly enlarged.  3. The mitral valve is normal  in structure. No evidence of mitral valve regurgitation. No evidence of mitral stenosis.  4. The aortic valve is normal in structure. Aortic valve regurgitation is not visualized. No aortic stenosis is present.  5. The inferior vena cava is normal in size with greater than 50% respiratory variability, suggesting right atrial pressure of 3 mmHg. FINDINGS  Left Ventricle: Left ventricular ejection fraction, by estimation, is 55 to 60%. The left ventricle has normal function. Left ventricular endocardial border not optimally defined to evaluate regional wall motion. The left ventricular internal cavity size was normal in size. There is mild left ventricular hypertrophy. Left ventricular diastolic parameters are consistent with Grade I diastolic dysfunction (impaired relaxation). Right Ventricle: The right ventricular size is mildly enlarged. No increase in right ventricular wall thickness. Right ventricular systolic function is normal. Left Atrium: Left atrial size was normal in size. Right Atrium: Right atrial size was normal in size. Pericardium: There is no evidence of pericardial effusion. Mitral Valve: The mitral valve is normal in structure. No evidence of mitral valve regurgitation. No evidence of mitral valve stenosis. Tricuspid Valve: The tricuspid valve is normal in structure. Tricuspid valve regurgitation is trivial. No evidence of tricuspid stenosis. Aortic Valve: The aortic valve is normal in structure. Aortic valve regurgitation is not visualized. No aortic stenosis is present. Aortic valve mean gradient measures 6.0 mmHg. Aortic valve peak gradient measures 12.5 mmHg. Aortic valve area, by VTI measures 3.53 cm. Pulmonic Valve: The pulmonic valve was normal in structure. Pulmonic valve regurgitation is mild. No evidence of pulmonic stenosis. Aorta: The aortic root is normal in size and structure. Venous: The inferior vena cava is normal in size with greater than 50% respiratory variability, suggesting  right atrial pressure of 3 mmHg. IAS/Shunts: No atrial level shunt detected by color flow Doppler.  LEFT VENTRICLE PLAX 2D LVIDd:         5.10 cm      Diastology LVIDs:         3.30 cm      LV e' medial:    8.81 cm/s LV PW:         1.50 cm      LV E/e' medial:  7.6 LV IVS:        1.20 cm      LV e' lateral:   10.40 cm/s LVOT diam:     2.50 cm      LV E/e' lateral: 6.5 LV SV:         128 LV SV Index:   50 LVOT Area:     4.91 cm  LV Volumes (MOD) LV vol d, MOD A2C: 140.0 ml LV vol d, MOD A4C: 76.4 ml LV vol s, MOD A2C: 64.2 ml LV vol s, MOD A4C: 56.4 ml LV SV MOD A2C:     75.8 ml LV SV MOD A4C:     76.4 ml LV SV MOD BP:      43.7 ml RIGHT VENTRICLE RV Basal  diam:  4.10 cm RV Mid diam:    4.00 cm RV S prime:     16.10 cm/s TAPSE (M-mode): 2.4 cm LEFT ATRIUM             Index       RIGHT ATRIUM           Index LA diam:        3.80 cm 1.47 cm/m  RA Area:     13.90 cm LA Vol (A2C):   52.6 ml 20.40 ml/m RA Volume:   30.10 ml  11.67 ml/m LA Vol (A4C):   59.0 ml 22.88 ml/m LA Biplane Vol: 55.5 ml 21.52 ml/m  AORTIC VALVE                    PULMONIC VALVE AV Area (Vmax):    3.69 cm     PV Vmax:       1.07 m/s AV Area (Vmean):   3.72 cm     PV Vmean:      70.800 cm/s AV Area (VTI):     3.53 cm     PV VTI:        0.200 m AV Vmax:           177.00 cm/s  PV Peak grad:  4.6 mmHg AV Vmean:          112.000 cm/s PV Mean grad:  2.0 mmHg AV VTI:            0.363 m AV Peak Grad:      12.5 mmHg AV Mean Grad:      6.0 mmHg LVOT Vmax:         133.00 cm/s LVOT Vmean:        84.900 cm/s LVOT VTI:          0.261 m LVOT/AV VTI ratio: 0.72  AORTA Ao Root diam: 3.60 cm Ao Asc diam:  2.90 cm MITRAL VALVE MV Area (PHT): 3.08 cm    SHUNTS MV Decel Time: 246 msec    Systemic VTI:  0.26 m MV E velocity: 67.10 cm/s  Systemic Diam: 2.50 cm MV A velocity: 82.30 cm/s MV E/A ratio:  0.82 Candee Furbish MD Electronically signed by Candee Furbish MD Signature Date/Time: 02/13/2021/1:58:10 PM    Final    US Abdomen Limited RUQ (LIVER/GB)  Result  Date: 02/10/2021 CLINICAL DATA:  Nausea, vomiting, abdominal pain EXAM: ULTRASOUND ABDOMEN LIMITED RIGHT UPPER QUADRANT COMPARISON:  11/14/2016 FINDINGS: Gallbladder: No gallstones or wall thickening visualized. No sonographic Murphy sign noted by sonographer. Common bile duct: Diameter: 6 mm in proximal diameter Liver: The hepatic parenchymal echogenicity is mildly, diffusely increased, similar to that noted on prior examination, in keeping with changes of mild hepatic steatosis. No focal intrahepatic lesions are identified. No intrahepatic biliary ductal dilation. Portal vein is patent on color Doppler imaging with normal direction of blood flow towards the liver. Other: None. IMPRESSION: Stable mild hepatic steatosis. Electronically Signed   By: Fidela Salisbury MD   On: 02/10/2021 23:15     Subjective: No acute issues or events overnight requesting discharge home which is certainly reasonable given resolution of symptoms, tolerating p.o. quite well but otherwise denies nausea vomiting diarrhea constipation headache fevers chills chest pain shortness of breath.   Discharge Exam: Vitals:   02/15/21 0659 02/15/21 0813  BP: (!) 164/117 131/64  Pulse: 68 88  Resp:  18  Temp:  98.4 F (36.9 C)  SpO2:  94%   Vitals:  02/14/21 2123 02/15/21 0622 02/15/21 0659 02/15/21 0813  BP: (!) 160/75 (!) 181/88 (!) 164/117 131/64  Pulse: 84 79 68 88  Resp: _0 Temp: 98.8 F (37.1 C) 98.7 F (37.1 C)  98.4 F (36.9 C)  TempSrc: Oral Oral    SpO2: 99% 96%  94%  Weight:      Height:        General:  Pleasantly resting in bed, No acute distress. HEENT:  Normocephalic atraumatic.  Sclerae nonicteric, noninjected.  Extraocular movements intact bilaterally. Neck:  Without mass or deformity.  Trachea is midline. Lungs:  Clear to auscultate bilaterally without rhonchi, wheeze, or rales. Heart:  Regular rate and rhythm.  Without murmurs, rubs, or gallops. Abdomen:  Soft, nontender, nondistended.   Without guarding or rebound. Extremities: Without cyanosis, clubbing, edema, or obvious deformity. Vascular:  Dorsalis pedis and posterior tibial pulses palpable bilaterally. Skin:  Warm and dry, no erythema, no ulcerations.    The results of significant diagnostics from this hospitalization (including imaging, microbiology, ancillary and laboratory) are listed below for reference.     Microbiology: Recent Results (from the past 240 hour(s))  Resp Panel by RT-PCR (Flu A&B, Covid) Nasopharyngeal Swab     Status: None   Collection Time: 02/10/21  8:48 PM   Specimen: Nasopharyngeal Swab; Nasopharyngeal(NP) swabs in vial transport medium  Result Value Ref Range Status   SARS Coronavirus 2 by RT PCR NEGATIVE NEGATIVE Final    Comment: (NOTE) SARS-CoV-2 target nucleic acids are NOT DETECTED.  The SARS-CoV-2 RNA is generally detectable in upper respiratory specimens during the acute phase of infection. The lowest concentration of SARS-CoV-2 viral copies this assay can detect is 138 copies/mL. A negative result does not preclude SARS-Cov-2 infection and should not be used as the sole basis for treatment or other patient management decisions. A negative result may occur with  improper specimen collection/handling, submission of specimen other than nasopharyngeal swab, presence of viral mutation(s) within the areas targeted by this assay, and inadequate number of viral copies(<138 copies/mL). A negative result must be combined with clinical observations, patient history, and epidemiological information. The expected result is Negative.  Fact Sheet for Patients:  EntrepreneurPulse.com.au  Fact Sheet for Healthcare Providers:  IncredibleEmployment.be  This test is no t yet approved or cleared by the [place] FDA and  has been authorized for detection and/or diagnosis of SARS-CoV-2 by FDA under an Emergency Use Authorization (EUA). This EUA will  remain  in effect (meaning this test can be used) for the duration of the COVID-19 declaration under Section 564(b)(1) of the Act, 21 U.S.C.section 360bbb-3(b)(1), unless the authorization is terminated  or revoked sooner.       Influenza A by PCR NEGATIVE NEGATIVE Final   Influenza B by PCR NEGATIVE NEGATIVE Final    Comment: (NOTE) The Xpert Xpress SARS-CoV-2/FLU/RSV plus assay is intended as an aid in the diagnosis of influenza from Nasopharyngeal swab specimens and should not be used as a sole basis for treatment. Nasal washings and aspirates are unacceptable for Xpert Xpress SARS-CoV-2/FLU/RSV testing.  Fact Sheet for Patients: EntrepreneurPulse.com.au  Fact Sheet for Healthcare Providers: IncredibleEmployment.be  This test is not yet approved or cleared by the [place] FDA and has been authorized for detection and/or diagnosis of SARS-CoV-2 by FDA under an Emergency Use Authorization (EUA). This EUA will remain in effect (meaning this test can be used) for the duration of the COVID-19 declaration under Section 564(b)(1) of the Act, 21 U.S.C.  section 360bbb-3(b)(1), unless the authorization is terminated or revoked.  Performed at [facility], [place] [address] [place], [place] [address]   Blood culture (routine x 2)     Status: Abnormal   Collection Time: 02/11/21  3:10 AM   Specimen: BLOOD  Result Value Ref Range Status   Specimen Description BLOOD SITE NOT SPECIFIED  Final   Special Requests   Final    BOTTLES DRAWN AEROBIC AND ANAEROBIC Blood Culture adequate volume   Culture  Setup Time (A)  Final    GRAM VARIABLE ROD IN BOTH AEROBIC AND ANAEROBIC BOTTLES CRITICAL RESULT CALLED TO, READ BACK BY AND VERIFIED WITH: PHARM D K.[facility] ON [id number] AT 2022 BY E.PARRISH Performed at [facility], [place] [address], [place], [place] [address]    Culture ESCHERICHIA COLI (A)  Final   Report Status 02/13/2021 FINAL  Final    Organism ID, Bacteria ESCHERICHIA COLI  Final      Susceptibility   Escherichia coli - MIC*    AMPICILLIN <=2 SENSITIVE Sensitive     CEFAZOLIN <=4 SENSITIVE Sensitive     CEFEPIME <=0.12 SENSITIVE Sensitive     CEFTAZIDIME <=1 SENSITIVE Sensitive     CEFTRIAXONE <=0.25 SENSITIVE Sensitive     CIPROFLOXACIN <=0.25 SENSITIVE Sensitive     GENTAMICIN <=1 SENSITIVE Sensitive     IMIPENEM <=0.25 SENSITIVE Sensitive     TRIMETH/SULFA <=20 SENSITIVE Sensitive     AMPICILLIN/SULBACTAM <=2 SENSITIVE Sensitive     PIP/TAZO <=4 SENSITIVE Sensitive     * ESCHERICHIA COLI  Blood Culture ID Panel (Reflexed)     Status: Abnormal   Collection Time: 02/11/21  3:10 AM  Result Value Ref Range Status   Enterococcus faecalis NOT DETECTED NOT DETECTED Final   Enterococcus Faecium NOT DETECTED NOT DETECTED Final   Listeria monocytogenes NOT DETECTED NOT DETECTED Final   Staphylococcus species NOT DETECTED NOT DETECTED Final   Staphylococcus aureus (BCID) NOT DETECTED NOT DETECTED Final   Staphylococcus epidermidis NOT DETECTED NOT DETECTED Final   Staphylococcus lugdunensis NOT DETECTED NOT DETECTED Final   Streptococcus species NOT DETECTED NOT DETECTED Final   Streptococcus agalactiae NOT DETECTED NOT DETECTED Final   Streptococcus pneumoniae NOT DETECTED NOT DETECTED Final   Streptococcus pyogenes NOT DETECTED NOT DETECTED Final   A.calcoaceticus-baumannii NOT DETECTED NOT DETECTED Final   Bacteroides fragilis NOT DETECTED NOT DETECTED Final   Enterobacterales DETECTED (A) NOT DETECTED Final    Comment: Enterobacterales represent a large order of gram negative bacteria, not a single organism. CRITICAL RESULT CALLED TO, READ BACK BY AND VERIFIED WITH: PHARM D K.[place] ON [id number] AT 2022 BY E.PARRISH    Enterobacter cloacae complex NOT DETECTED NOT DETECTED Final   Escherichia coli DETECTED (A) NOT DETECTED Final    Comment: CRITICAL RESULT CALLED TO, READ BACK BY AND VERIFIED WITH: PHARM D K.[place]  ON [id number] AT 2022 BY E.PARRISH    Klebsiella aerogenes NOT DETECTED NOT DETECTED Final   Klebsiella oxytoca NOT DETECTED NOT DETECTED Final   Klebsiella pneumoniae NOT DETECTED NOT DETECTED Final   Proteus species NOT DETECTED NOT DETECTED Final   Salmonella species NOT DETECTED NOT DETECTED Final   Serratia marcescens NOT DETECTED NOT DETECTED Final   Haemophilus influenzae NOT DETECTED NOT DETECTED Final   Neisseria meningitidis NOT DETECTED NOT DETECTED Final   Pseudomonas aeruginosa NOT DETECTED NOT DETECTED Final   Stenotrophomonas maltophilia NOT DETECTED NOT DETECTED Final   Candida albicans NOT DETECTED NOT DETECTED Final  Candida auris NOT DETECTED NOT DETECTED Final   Candida glabrata NOT DETECTED NOT DETECTED Final   Candida krusei NOT DETECTED NOT DETECTED Final   Candida parapsilosis NOT DETECTED NOT DETECTED Final   Candida tropicalis NOT DETECTED NOT DETECTED Final   Cryptococcus neoformans/gattii NOT DETECTED NOT DETECTED Final   CTX-M ESBL NOT DETECTED NOT DETECTED Final   Carbapenem resistance IMP NOT DETECTED NOT DETECTED Final   Carbapenem resistance KPC NOT DETECTED NOT DETECTED Final   Carbapenem resistance NDM NOT DETECTED NOT DETECTED Final   Carbapenem resist OXA 48 LIKE NOT DETECTED NOT DETECTED Final   Carbapenem resistance VIM NOT DETECTED NOT DETECTED Final    Comment: Performed at [facility] [address], [place], [place] [address]  Blood culture (routine x 2)     Status: Abnormal   Collection Time: 02/11/21  3:20 AM   Specimen: BLOOD  Result Value Ref Range Status   Specimen Description BLOOD SITE NOT SPECIFIED  Final   Special Requests   Final    BOTTLES DRAWN AEROBIC AND ANAEROBIC Blood Culture adequate volume   Culture  Setup Time   Final    GRAM NEGATIVE RODS IN BOTH AEROBIC AND ANAEROBIC BOTTLES CRITICAL VALUE NOTED.  VALUE IS CONSISTENT WITH PREVIOUSLY REPORTED AND CALLED VALUE.    Culture (A)  Final    ESCHERICHIA  COLI SUSCEPTIBILITIES PERFORMED ON PREVIOUS CULTURE WITHIN THE LAST 5 DAYS. Performed at [facility], [place] [address] [place], [place] [address]    Report Status 02/13/2021 FINAL  Final  Culture, blood (routine x 2)     Status: None (Preliminary result)   Collection Time: 02/13/21  9:23 AM   Specimen: BLOOD RIGHT HAND  Result Value Ref Range Status   Specimen Description BLOOD RIGHT HAND  Final   Special Requests   Final    BOTTLES DRAWN AEROBIC AND ANAEROBIC Blood Culture adequate volume   Culture   Final    NO GROWTH 2 DAYS Performed at [facility], [place] [address] [place], [place] [address]    Report Status PENDING  Incomplete  Culture, blood (routine x 2)     Status: None (Preliminary result)   Collection Time: 02/13/21  9:32 AM   Specimen: BLOOD RIGHT WRIST  Result Value Ref Range Status   Specimen Description BLOOD RIGHT WRIST  Final   Special Requests   Final    BOTTLES DRAWN AEROBIC AND ANAEROBIC Blood Culture adequate volume   Culture   Final    NO GROWTH 2 DAYS Performed at [facility], [place] [address] [place], [place] [address]    Report Status PENDING  Incomplete     Labs: BNP (last 3 results) No results for input(s): BNP in the last 8760 hours. Basic Metabolic Panel: Recent Labs  Lab 02/10/21 1845 02/11/21 0353 02/12/21 0347 02/13/21 0111 02/14/21 0156 02/15/21 0439  NA 136  --  133* 133* 133* 138  K 4.2  --  4.2 3.5 3.3* 3.4*  CL 104  --  98 98 101 100  CO2 23  --  _0 GLUCOSE 227*  --  225* 222* 216* 176*  BUN 15  --  _1 CREATININE 1.08* 1.01* 1.14* 0.94 0.79 0.97  CALCIUM 9.4  --  9.0 8.5* 8.2* 8.7*  MG  --  1.6* 2.0  --   --  1.9  PHOS  --  3.0  --   --   --   --  Liver Function Tests: Recent Labs  Lab 02/10/21 1845  AST 40  ALT 40  ALKPHOS 103  BILITOT 0.7  PROT 6.9  ALBUMIN 3.3*   Recent Labs  Lab 02/10/21 1845  LIPASE 31   No results for input(s): AMMONIA in the last 168  hours. CBC: Recent Labs  Lab 02/10/21 1845 02/11/21 0353 02/12/21 0347 02/13/21 0111 02/14/21 0156  WBC 13.8* 17.8* 14.6* 9.8 7.0  NEUTROABS 11.8*  --   --   --   --   HGB 13.0 13.6 12.6 11.8* 11.2*  HCT 40.7 42.3 39.5 37.7 35.4*  MCV 84.3 84.1 84.4 84.7 85.3  PLT 210 196 164 132* 144*   Cardiac Enzymes: No results for input(s): CKTOTAL, CKMB, CKMBINDEX, TROPONINI in the last 168 hours. BNP: Invalid input(s): POCBNP CBG: Recent Labs  Lab 02/14/21 1127 02/14/21 1708 02/14/21 2123 02/15/21 0623 02/15/21 1144  GLUCAP 159* 171* 187* 186* 201*   D-Dimer No results for input(s): DDIMER in the last 72 hours. Hgb A1c No results for input(s): HGBA1C in the last 72 hours. Lipid Profile Recent Labs    02/13/21 0932  CHOL 163  HDL 32*  LDLCALC 105*  TRIG 129  CHOLHDL 5.1   Thyroid function studies No results for input(s): TSH, T4TOTAL, T3FREE, THYROIDAB in the last 72 hours.  Invalid input(s): FREET3 Anemia work up No results for input(s): VITAMINB12, FOLATE, FERRITIN, TIBC, IRON, RETICCTPCT in the last 72 hours. Urinalysis    Component Value Date/Time   COLORURINE YELLOW 02/10/2021 1950   APPEARANCEUR CLEAR 02/10/2021 1950   LABSPEC 1.013 02/10/2021 1950   PHURINE 5.0 02/10/2021 1950   GLUCOSEU NEGATIVE 02/10/2021 1950   HGBUR MODERATE (A) 02/10/2021 1950   BILIRUBINUR NEGATIVE 02/10/2021 1950   BILIRUBINUR negative 12/16/2020 1128   BILIRUBINUR neg 10/09/2017 1643   KETONESUR NEGATIVE 02/10/2021 1950   PROTEINUR NEGATIVE 02/10/2021 1950   UROBILINOGEN 0.2 12/16/2020 1128   UROBILINOGEN 0.2 03/25/2014 1326   NITRITE NEGATIVE 02/10/2021 1950   LEUKOCYTESUR TRACE (A) 02/10/2021 1950   Sepsis Labs Invalid input(s): PROCALCITONIN,  WBC,  LACTICIDVEN Microbiology Recent Results (from the past 240 hour(s))  Resp Panel by RT-PCR (Flu A&B, Covid) Nasopharyngeal Swab     Status: None   Collection Time: 02/10/21  8:48 PM   Specimen: Nasopharyngeal Swab;  Nasopharyngeal(NP) swabs in vial transport medium  Result Value Ref Range Status   SARS Coronavirus 2 by RT PCR NEGATIVE NEGATIVE Final    Comment: (NOTE) SARS-CoV-2 target nucleic acids are NOT DETECTED.  The SARS-CoV-2 RNA is generally detectable in upper respiratory specimens during the acute phase of infection. The lowest concentration of SARS-CoV-2 viral copies this assay can detect is 138 copies/mL. A negative result does not preclude SARS-Cov-2 infection and should not be used as the sole basis for treatment or other patient management decisions. A negative result may occur with  improper specimen collection/handling, submission of specimen other than nasopharyngeal swab, presence of viral mutation(s) within the areas targeted by this assay, and inadequate number of viral copies(<138 copies/mL). A negative result must be combined with clinical observations, patient history, and epidemiological information. The expected result is Negative.  Fact Sheet for Patients:  EntrepreneurPulse.com.au  Fact Sheet for Healthcare Providers:  IncredibleEmployment.be  This test is no t yet approved or cleared by the [place] FDA and  has been authorized for detection and/or diagnosis of SARS-CoV-2 by FDA under an Emergency Use Authorization (EUA). This EUA will remain  in effect (meaning this test can  be used) for the duration of the COVID-19 declaration under Section 564(b)(1) of the Act, 21 U.S.C.section 360bbb-3(b)(1), unless the authorization is terminated  or revoked sooner.       Influenza A by PCR NEGATIVE NEGATIVE Final   Influenza B by PCR NEGATIVE NEGATIVE Final    Comment: (NOTE) The Xpert Xpress SARS-CoV-2/FLU/RSV plus assay is intended as an aid in the diagnosis of influenza from Nasopharyngeal swab specimens and should not be used as a sole basis for treatment. Nasal washings and aspirates are unacceptable for Xpert Xpress  SARS-CoV-2/FLU/RSV testing.  Fact Sheet for Patients: EntrepreneurPulse.com.au  Fact Sheet for Healthcare Providers: IncredibleEmployment.be  This test is not yet approved or cleared by the [place] FDA and has been authorized for detection and/or diagnosis of SARS-CoV-2 by FDA under an Emergency Use Authorization (EUA). This EUA will remain in effect (meaning this test can be used) for the duration of the COVID-19 declaration under Section 564(b)(1) of the Act, 21 U.S.C. section 360bbb-3(b)(1), unless the authorization is terminated or revoked.  Performed at [facility], [place] [address] [place], [place] [address]   Blood culture (routine x 2)     Status: Abnormal   Collection Time: 02/11/21  3:10 AM   Specimen: BLOOD  Result Value Ref Range Status   Specimen Description BLOOD SITE NOT SPECIFIED  Final   Special Requests   Final    BOTTLES DRAWN AEROBIC AND ANAEROBIC Blood Culture adequate volume   Culture  Setup Time (A)  Final    GRAM VARIABLE ROD IN BOTH AEROBIC AND ANAEROBIC BOTTLES CRITICAL RESULT CALLED TO, READ BACK BY AND VERIFIED WITH: PHARM D K.[facility] ON [id number] AT 2022 BY E.PARRISH Performed at [facility], [place] [address] [place], [place] [address]    Culture ESCHERICHIA COLI (A)  Final   Report Status 02/13/2021 FINAL  Final   Organism ID, Bacteria ESCHERICHIA COLI  Final      Susceptibility   Escherichia coli - MIC*    AMPICILLIN <=2 SENSITIVE Sensitive     CEFAZOLIN <=4 SENSITIVE Sensitive     CEFEPIME <=0.12 SENSITIVE Sensitive     CEFTAZIDIME <=1 SENSITIVE Sensitive     CEFTRIAXONE <=0.25 SENSITIVE Sensitive     CIPROFLOXACIN <=0.25 SENSITIVE Sensitive     GENTAMICIN <=1 SENSITIVE Sensitive     IMIPENEM <=0.25 SENSITIVE Sensitive     TRIMETH/SULFA <=20 SENSITIVE Sensitive     AMPICILLIN/SULBACTAM <=2 SENSITIVE Sensitive     PIP/TAZO <=4 SENSITIVE Sensitive     * ESCHERICHIA COLI  Blood Culture ID  Panel (Reflexed)     Status: Abnormal   Collection Time: 02/11/21  3:10 AM  Result Value Ref Range Status   Enterococcus faecalis NOT DETECTED NOT DETECTED Final   Enterococcus Faecium NOT DETECTED NOT DETECTED Final   Listeria monocytogenes NOT DETECTED NOT DETECTED Final   Staphylococcus species NOT DETECTED NOT DETECTED Final   Staphylococcus aureus (BCID) NOT DETECTED NOT DETECTED Final   Staphylococcus epidermidis NOT DETECTED NOT DETECTED Final   Staphylococcus lugdunensis NOT DETECTED NOT DETECTED Final   Streptococcus species NOT DETECTED NOT DETECTED Final   Streptococcus agalactiae NOT DETECTED NOT DETECTED Final   Streptococcus pneumoniae NOT DETECTED NOT DETECTED Final   Streptococcus pyogenes NOT DETECTED NOT DETECTED Final   A.calcoaceticus-baumannii NOT DETECTED NOT DETECTED Final   Bacteroides fragilis NOT DETECTED NOT DETECTED Final   Enterobacterales DETECTED (A) NOT DETECTED Final    Comment: Enterobacterales represent a large order of gram  negative bacteria, not a single organism. CRITICAL RESULT CALLED TO, READ BACK BY AND VERIFIED WITH: PHARM D K.[place] ON [id number] AT 2022 BY E.PARRISH    Enterobacter cloacae complex NOT DETECTED NOT DETECTED Final   Escherichia coli DETECTED (A) NOT DETECTED Final    Comment: CRITICAL RESULT CALLED TO, READ BACK BY AND VERIFIED WITH: PHARM D K.[place] ON [id number] AT 2022 BY E.PARRISH    Klebsiella aerogenes NOT DETECTED NOT DETECTED Final   Klebsiella oxytoca NOT DETECTED NOT DETECTED Final   Klebsiella pneumoniae NOT DETECTED NOT DETECTED Final   Proteus species NOT DETECTED NOT DETECTED Final   Salmonella species NOT DETECTED NOT DETECTED Final   Serratia marcescens NOT DETECTED NOT DETECTED Final   Haemophilus influenzae NOT DETECTED NOT DETECTED Final   Neisseria meningitidis NOT DETECTED NOT DETECTED Final   Pseudomonas aeruginosa NOT DETECTED NOT DETECTED Final   Stenotrophomonas maltophilia NOT DETECTED NOT DETECTED Final    Candida albicans NOT DETECTED NOT DETECTED Final   Candida auris NOT DETECTED NOT DETECTED Final   Candida glabrata NOT DETECTED NOT DETECTED Final   Candida krusei NOT DETECTED NOT DETECTED Final   Candida parapsilosis NOT DETECTED NOT DETECTED Final   Candida tropicalis NOT DETECTED NOT DETECTED Final   Cryptococcus neoformans/gattii NOT DETECTED NOT DETECTED Final   CTX-M ESBL NOT DETECTED NOT DETECTED Final   Carbapenem resistance IMP NOT DETECTED NOT DETECTED Final   Carbapenem resistance KPC NOT DETECTED NOT DETECTED Final   Carbapenem resistance NDM NOT DETECTED NOT DETECTED Final   Carbapenem resist OXA 48 LIKE NOT DETECTED NOT DETECTED Final   Carbapenem resistance VIM NOT DETECTED NOT DETECTED Final    Comment: Performed at [facility], [address], [place], [place] [address]  Blood culture (routine x 2)     Status: Abnormal   Collection Time: 02/11/21  3:20 AM   Specimen: BLOOD  Result Value Ref Range Status   Specimen Description BLOOD SITE NOT SPECIFIED  Final   Special Requests   Final    BOTTLES DRAWN AEROBIC AND ANAEROBIC Blood Culture adequate volume   Culture  Setup Time   Final    GRAM NEGATIVE RODS IN BOTH AEROBIC AND ANAEROBIC BOTTLES CRITICAL VALUE NOTED.  VALUE IS CONSISTENT WITH PREVIOUSLY REPORTED AND CALLED VALUE.    Culture (A)  Final    ESCHERICHIA COLI SUSCEPTIBILITIES PERFORMED ON PREVIOUS CULTURE WITHIN THE LAST 5 DAYS. Performed at [facility], [place] [address], [place], [place] [address]    Report Status 02/13/2021 FINAL  Final  Culture, blood (routine x 2)     Status: None (Preliminary result)   Collection Time: 02/13/21  9:23 AM   Specimen: BLOOD RIGHT HAND  Result Value Ref Range Status   Specimen Description BLOOD RIGHT HAND  Final   Special Requests   Final    BOTTLES DRAWN AEROBIC AND ANAEROBIC Blood Culture adequate volume   Culture   Final    NO GROWTH 2 DAYS Performed at [facility], [place] [address],  [place], [place] [address]    Report Status PENDING  Incomplete  Culture, blood (routine x 2)     Status: None (Preliminary result)   Collection Time: 02/13/21  9:32 AM   Specimen: BLOOD RIGHT WRIST  Result Value Ref Range Status   Specimen Description BLOOD RIGHT WRIST  Final   Special Requests   Final    BOTTLES DRAWN AEROBIC AND ANAEROBIC Blood Culture adequate volume   Culture  Final    NO GROWTH 2 DAYS Performed at [facility], [place] [address] [place], [place] [address]    Report Status PENDING  Incomplete     Time coordinating discharge: Over 30 minutes  SIGNED:   Little Ishikawa, DO [facility] 02/15/2021, 12:13 PM Pager   If 7PM-7AM, please contact night-coverage [email or web address]

## 2021-02-16 ENCOUNTER — Telehealth: Payer: Self-pay

## 2021-02-16 NOTE — Telephone Encounter (Signed)
-----   Message from [place], [place] sent at 02/14/2021 10:11 AM EDT ----- Regarding: follow up in 6-8 weeks Dr. Flora Lipps want the patient to see him in 6-8 weeks. Please call and arrange.  Azalee Course

## 2021-02-16 NOTE — Telephone Encounter (Signed)
Transition Care Management Follow-up Telephone Call Date of discharge and from where: 02/15/2021-[facility] How have you been since you were released from the hospital? Patient stated she is doing fine. She has been resting. Any questions or concerns? No  Items Reviewed: Did the pt receive and understand the discharge instructions provided? Yes  Medications obtained and verified? Yes  Other? No  Any new allergies since your discharge? No  Dietary orders reviewed? N/A Do you have support at home? Yes   Home Care and Equipment/Supplies: Were home health services ordered? not applicable If so, what is the name of the agency? N/A  Has the agency set up a time to come to the patient's home? not applicable Were any new equipment or medical supplies ordered?  No What is the name of the medical supply agency? N/A Were you able to get the supplies/equipment? not applicable Do you have any questions related to the use of the equipment or supplies? No  Functional Questionnaire: (I = Independent and D = Dependent) ADLs: I  Bathing/Dressing- I  Meal Prep- I  Eating- I  Maintaining continence- I  Transferring/Ambulation- I  Managing Meds- I  Follow up appointments reviewed:  PCP Hospital f/u appt confirmed? No  Specialist Hospital f/u appt confirmed? Yes  Scheduled to see Dr. Flora Lipps on 04/05/2021 @ 11:00 am. Are transportation arrangements needed? No  If their condition worsens, is the pt aware to call PCP or go to the Emergency Dept.? Yes Was the patient provided with contact information for the PCP's office or ED? Yes Was to pt encouraged to call back with questions or concerns? Yes

## 2021-02-16 NOTE — Telephone Encounter (Signed)
Call placed to patient to schedule hospital follow up appointment. Per discharge summary, she is to follow up with PCP in 1-2 weeks and her appointment at [facility] is not until 03/24/2021. She is new to insulin. She did not want to be seen at [facility] and requested the appointment at [facility].  Appointment then scheduled for 02/27/2021 @ 1430. She confirmed she has the phone number for [facility]   She said she does not have her insulin. Her pharmacy told her that they did not have any in stock and did not give her a date when they would have it and they said they could  not check with another [facility] to see if that pharmacy had it.   Explained to her that she can have the prescription transferred to another pharmacy if she wishes and she said she would like to stay with [facility] instructed her to contact the clinic if she is not able to get the insulin and stressed the importance of  starting the insulin as soon as possible. She has a glucometer and her blood sugar this morning was 163.  Reviewed when to call the clinic with high/low blood sugar results

## 2021-02-16 NOTE — Telephone Encounter (Signed)
Called patient, scheduled hospital follow up for 04/05/2021 at 11:00 AM with Dr.O'Neal. patient made aware, verbalized understanding.

## 2021-02-18 LAB — CULTURE, BLOOD (ROUTINE X 2)
Culture: NO GROWTH
Culture: NO GROWTH
Special Requests: ADEQUATE
Special Requests: ADEQUATE

## 2021-02-27 ENCOUNTER — Ambulatory Visit (INDEPENDENT_AMBULATORY_CARE_PROVIDER_SITE_OTHER): Payer: Medicaid Other | Admitting: Primary Care

## 2021-02-28 ENCOUNTER — Other Ambulatory Visit (HOSPITAL_COMMUNITY)
Admission: RE | Admit: 2021-02-28 | Discharge: 2021-02-28 | Disposition: A | Discharge status: Home / Self Care | Payer: Medicaid Other | Source: Ambulatory Visit | Attending: Physician Assistant | Admitting: Physician Assistant

## 2021-02-28 ENCOUNTER — Ambulatory Visit (INDEPENDENT_AMBULATORY_CARE_PROVIDER_SITE_OTHER): Payer: Medicaid Other | Admitting: Physician Assistant

## 2021-02-28 ENCOUNTER — Encounter: Payer: Self-pay | Admitting: Physician Assistant

## 2021-02-28 ENCOUNTER — Other Ambulatory Visit: Payer: Self-pay

## 2021-02-28 VITALS — BP 158/76 | HR 98 | Temp 98.2°F | Resp 18 | Ht 68.0 in | Wt 337.0 lb

## 2021-02-28 DIAGNOSIS — B379 Candidiasis, unspecified: Secondary | ICD-10-CM | POA: Diagnosis not present

## 2021-02-28 DIAGNOSIS — D696 Thrombocytopenia, unspecified: Secondary | ICD-10-CM | POA: Diagnosis not present

## 2021-02-28 DIAGNOSIS — T3695XA Adverse effect of unspecified systemic antibiotic, initial encounter: Secondary | ICD-10-CM

## 2021-02-28 DIAGNOSIS — Z8719 Personal history of other diseases of the digestive system: Secondary | ICD-10-CM | POA: Insufficient documentation

## 2021-02-28 DIAGNOSIS — E876 Hypokalemia: Secondary | ICD-10-CM

## 2021-02-28 DIAGNOSIS — E1169 Type 2 diabetes mellitus with other specified complication: Secondary | ICD-10-CM

## 2021-02-28 LAB — POCT URINALYSIS DIP (CLINITEK)
Bilirubin, UA: NEGATIVE
Blood, UA: NEGATIVE
Glucose, UA: 500 mg/dL — AB
Ketones, POC UA: NEGATIVE mg/dL
Leukocytes, UA: NEGATIVE
Nitrite, UA: NEGATIVE
POC PROTEIN,UA: NEGATIVE
Spec Grav, UA: 1.025 (ref 1.010–1.025)
Urobilinogen, UA: 0.2 E.U./dL
pH, UA: 5.5 (ref 5.0–8.0)

## 2021-02-28 MED ORDER — FLUCONAZOLE 150 MG PO TABS: ORAL_TABLET | ORAL | 0 refills | Status: DC

## 2021-02-28 NOTE — Patient Instructions (Signed)
You are going to take Diflucan, once, continue taking your antibiotic regimen as prescribed by the hospital.  If you continue to have symptoms in 1 week, you can take the Diflucan 1 more time.  We will call you with today's lab results.  Roney Jaffe, PA-C Physician Assistant [facility] [email or web address]   Vaginal Yeast Infection, Adult  Vaginal yeast infection is a condition that causes vaginal discharge as well as soreness, swelling, and redness (inflammation) of the vagina. This is a common condition. Some women get this infectionfrequently. What are the causes? This condition is caused by a change in the normal balance of the yeast (candida) and bacteria that live in the vagina. This change causes an overgrowth ofyeast, which causes the inflammation. What increases the risk? The condition is more likely to develop in women who: Take antibiotic medicines. Have diabetes. Take birth control pills. Are pregnant. Douche often. Have a weak body defense system (immune system). Have been taking steroid medicines for a long time. Frequently wear tight clothing. What are the signs or symptoms? Symptoms of this condition include: White, thick, creamy vaginal discharge. Swelling, itching, redness, and irritation of the vagina. The lips of the vagina (vulva) may be affected as well. Pain or a burning feeling while urinating. Pain during sex. How is this diagnosed? This condition is diagnosed based on: Your medical history. A physical exam. A pelvic exam. Your health care provider will examine a sample of your vaginal discharge under a microscope. Your health care provider may send this sample for testing to confirm the diagnosis. How is this treated? This condition is treated with medicine. Medicines may be over-the-counter or prescription. You may be told to use one or more of the following: Medicine that is taken by mouth  (orally). Medicine that is applied as a cream (topically). Medicine that is inserted directly into the vagina (suppository). Follow these instructions at home:  Lifestyle Do not have sex until your health care provider approves. Tell your sex partner that you have a yeast infection. That person should go to his or her health care provider and ask if they should also be treated. Do not wear tight clothes, such as pantyhose or tight pants. Wear breathable cotton underwear. General instructions Take or apply over-the-counter and prescription medicines only as told by your health care provider. Eat more yogurt. This may help to keep your yeast infection from returning. Do not use tampons until your health care provider approves. Try taking a sitz bath to help with discomfort. This is a warm water bath that is taken while you are sitting down. The water should only come up to your hips and should cover your buttocks. Do this 3-4 times per day or as told by your health care provider. Do not douche. If you have diabetes, keep your blood sugar levels under control. Keep all follow-up visits as told by your health care provider. This is important. Contact a health care provider if: You have a fever. Your symptoms go away and then return. Your symptoms do not get better with treatment. Your symptoms get worse. You have new symptoms. You develop blisters in or around your vagina. You have blood coming from your vagina and it is not your menstrual period. You develop pain in your abdomen. Summary Vaginal yeast infection is a condition that causes discharge as well as soreness, swelling, and redness (inflammation) of the vagina. This condition is treated with medicine. Medicines may be over-the-counter or prescription. Take or  apply over-the-counter and prescription medicines only as told by your health care provider. Do not douche. Do not have sex or use tampons until your health care provider  approves. Contact a health care provider if your symptoms do not get better with treatment or your symptoms go away and then return. This information is not intended to replace advice given to you by your health care provider. Make sure you discuss any questions you have with your healthcare provider. Document Revised: 07/06/2020 Document Reviewed: 12/02/2017 Elsevier Patient Education  2022 [employer].

## 2021-02-28 NOTE — Progress Notes (Signed)
Established Patient Office Visit  Subjective:  Patient ID: Nicole [name], female    DOB: August 17, 1975  Age: 45 y.o. MRN: [id number]  CC:  Chief Complaint  Patient presents with   Follow-up    Diverticulitis    HPI Nicole [name] reports that she was hospitalized from July 15 through February 15, 2021.  Hospital course   Recommendations for Outpatient Follow-up:  Follow up with PCP in 1-2 weeks Please obtain BMP/CBC in one week   Discharge Condition: Stable CODE STATUS: Full Diet recommendation: Diabetic diet as tolerated   Brief/Interim Summary: Nicole [name] is a 45 year old female with past medical history significant for diabetes who presented with complaints of right upper quadrant abdominal pain.  Work-up in the emergency department revealed acute sigmoid diverticulitis, gallbladder appeared unremarkable without intra or extrahepatic duct dilation.  Patient was started on Rocephin and IV Flagyl.  Patient was found to have E. coli bacteremia as well.  On 7/18, patient developed right-sided chest pain, shortness of breath yesterday evening, troponin was elevated into the 200s.  Cardiology was consulted.  Patient underwent CTA chest which was negative for PE, showed small right-sided pleural effusion.  Echocardiogram largely unremarkable with grade 1 diastolic dysfunction seen.  Troponin elevation thought to be secondary to sepsis pathology.   Patient admitted as above with sepsis secondary to sigmoid diverticulitis and notable E. coli bacteremia likely in the setting of translocation from the colon.  Patient also had profoundly uncontrolled diabetes with right pleural effusion in the setting of heart failure exacerbation.  Patient diuresed quite well, antibiotics were de-escalated to Ancef transition to cephalexin at discharge for additional 10 days of coverage.  Patient states she feels quite well tolerating p.o. denies any further shortness of breath chest pain dyspnea with exertion  nausea vomiting diarrhea or constipation.  Patient be discharged on new insulin given profoundly elevated A1c at 10.  Patient will be discharged on Lantus once daily with new insulin pen, glucometer and home glucose kit including lancets and test strips.  Patient will need very close follow-up with PCP in the next week for further evaluation and testing to ensure her home glucose levels are more well controlled than previous.  Lengthy discussion at bedside daily about need for medication compliance and dietary and lifestyle changes.  Incidentally patient had minimally elevated troponin during hospital stay, cardiology evaluated, likely in the setting of sepsis and bacteremia as above no further work-up warranted in-house, recommending outpatient follow-up for coronary CTA and sleep study.   Discharge Diagnoses:  Active Problems:   Sigmoid diverticulitis  States today that she continues to have some pain, but states it is well controlled and she does not use her pain medication very often.  Reports that she does not want to finish her antibiotic regimen, states that she has 4 pills left.  States she thinks it is giving her a yeast infection.  Reports that she has been having vaginal itching, and burning when she urinates.  States that she is unsure if she has any vaginal discharge because she takes a shower 3 times a day.  Reports that she has been compliant to her medications, states that she has been working adamantly on lifestyle modifications.     Past Medical History:  Diagnosis Date   Arthritis    knees   Hypertension    Ovarian cyst    Pregnancy induced hypertension     Past Surgical History:  Procedure Laterality Date   APPENDECTOMY  CESAREAN SECTION     HERNIA REPAIR     LAPAROSCOPIC APPENDECTOMY N/A 03/25/2014   Procedure: APPENDECTOMY LAPAROSCOPIC;  Surgeon: Gwenyth Ober, MD;  Location: [place];  Service: General;  Laterality: N/A;    Family History  Problem Relation Age of  Onset   Hypertension Mother    Cancer Mother        pancreatic cancer   Arthritis Father    Heart disease Maternal Grandmother     Social History   Socioeconomic History   Marital status: Single    Spouse name: Not on file   Number of children: Not on file   Years of education: Not on file   Highest education level: Not on file  Occupational History   Not on file  Tobacco Use   Smoking status: Former    Packs/day: 0.50    Years: 27.00    Pack years: 13.50    Types: Cigarettes    Quit date: 10/28/2020    Years since quitting: 0.3   Smokeless tobacco: Never   Tobacco comments:    6 cigs daily  Vaping Use   Vaping Use: Never used  Substance and Sexual Activity   Alcohol use: Not Currently    Comment: occ   Drug use: Yes    Frequency: 7.0 times per week    Types: Marijuana   Sexual activity: Not Currently    Comment: 5 years  Other Topics Concern   Not on file  Social History Narrative   Not on file   Social Determinants of Health   Financial Resource Strain: Not on file  Food Insecurity: No Food Insecurity   Worried About Charity fundraiser in the Last Year: Never true   [place] in the Last Year: Never true  Transportation Needs: No Transportation Needs   Lack of Transportation (Medical): No   Lack of Transportation (Non-Medical): No  Physical Activity: Not on file  Stress: Not on file  Social Connections: Not on file  Intimate Partner Violence: Not on file    Outpatient Medications Prior to Visit  Medication Sig Dispense Refill   albuterol (VENTOLIN HFA) 108 (90 Base) MCG/ACT inhaler Inhale 2 puffs into the lungs every 6 (six) hours as needed for wheezing or shortness of breath. 18 g 6   atorvastatin (LIPITOR) 80 MG tablet Take 1 tablet (80 mg total) by mouth daily. 30 tablet 0   blood glucose meter kit and supplies KIT Dispense based on patient and insurance preference. Use up to four times daily as directed. 1 each 0   glimepiride (AMARYL) 2 MG  tablet Take 1 tablet (2 mg total) by mouth daily before breakfast. 30 tablet 3   HYDROcodone-acetaminophen (NORCO/VICODIN) 5-325 MG tablet Take 1 tablet by mouth every 4 (four) hours as needed for moderate pain. 20 tablet 0   hydrOXYzine (ATARAX/VISTARIL) 10 MG tablet Take 1 tablet (10 mg total) by mouth 2 (two) times daily as needed. 60 tablet 6   insulin glargine (LANTUS) 100 UNIT/ML Solostar Pen Inject 20 Units into the skin daily. 15 mL 0   Insulin Pen Needle (PEN NEEDLES) 32G X 4 MM MISC 1 each by Does not apply route daily. 100 each 0   losartan (COZAAR) 25 MG tablet Take 1 tablet (25 mg total) by mouth daily. 30 tablet 0   tiotropium (SPIRIVA HANDIHALER) 18 MCG inhalation capsule Place 1 capsule (18 mcg total) into inhaler and inhale daily. 30 capsule 6   No  facility-administered medications prior to visit.    No Known Allergies  ROS Review of Systems  Constitutional: Negative.   HENT: Negative.    Eyes: Negative.   Respiratory:  Negative for shortness of breath.   Cardiovascular:  Negative for chest pain.  Gastrointestinal:  Negative for abdominal pain, diarrhea, nausea and vomiting.  Endocrine: Negative.   Genitourinary:  Positive for dysuria.  Musculoskeletal: Negative.   Skin: Negative.   Allergic/Immunologic: Negative.   Neurological: Negative.   Hematological: Negative.   Psychiatric/Behavioral: Negative.       Objective:    Physical Exam Vitals and nursing note reviewed.  Constitutional:      Appearance: Normal appearance. She is obese.  HENT:     Head: Normocephalic and atraumatic.     Right Ear: External ear normal.     Left Ear: External ear normal.     Nose: Nose normal.     Mouth/Throat:     Mouth: Mucous membranes are moist.     Pharynx: Oropharynx is clear.  Eyes:     Extraocular Movements: Extraocular movements intact.     Conjunctiva/sclera: Conjunctivae normal.     Pupils: Pupils are equal, round, and reactive to light.  Cardiovascular:      Rate and Rhythm: Normal rate and regular rhythm.     Pulses: Normal pulses.     Heart sounds: Normal heart sounds.  Pulmonary:     Effort: Pulmonary effort is normal.     Breath sounds: Normal breath sounds.  Abdominal:     General: Abdomen is flat. There is no distension.     Palpations: Abdomen is soft.     Tenderness: There is no abdominal tenderness.  Musculoskeletal:        General: Normal range of motion.     Cervical back: Normal range of motion and neck supple.  Skin:    General: Skin is warm and dry.  Neurological:     General: No focal deficit present.     Mental Status: She is alert and oriented to person, place, and time.  Psychiatric:        Mood and Affect: Mood normal.        Behavior: Behavior normal.        Thought Content: Thought content normal.        Judgment: Judgment normal.    BP (!) 158/76 (BP Location: Right Arm, Patient Position: Sitting, Cuff Size: Large)   Pulse 98   Temp 98.2 F (36.8 C) (Temporal)   Resp 18   Ht 5' 8" (1.727 m)   Wt (!) 337 lb (152.9 kg)   SpO2 100%   BMI 51.24 kg/m  Wt Readings from Last 3 Encounters:  02/28/21 (!) 337 lb (152.9 kg)  02/10/21 (!) 345 lb 10.9 oz (156.8 kg)  12/16/20 (!) 345 lb 9.6 oz (156.8 kg)     Health Maintenance Due  Topic Date Due   PNEUMOCOCCAL POLYSACCHARIDE VACCINE AGE 59-64 HIGH RISK  Never done   COVID-19 Vaccine (1) Never done   FOOT EXAM  Never done   OPHTHALMOLOGY EXAM  Never done   Hepatitis C Screening  Never done   TETANUS/TDAP  Never done   COLONOSCOPY (Pts 45-109yr Insurance coverage will need to be confirmed)  Never done   INFLUENZA VACCINE  02/27/2021    There are no preventive care reminders to display for this patient.  Lab Results  Component Value Date   TSH 1.260 10/09/2017   Lab Results  Component  Value Date   WBC 7.0 02/14/2021   HGB 11.2 (L) 02/14/2021   HCT 35.4 (L) 02/14/2021   MCV 85.3 02/14/2021   PLT 144 (L) 02/14/2021   Lab Results  Component Value  Date   NA 138 02/15/2021   K 3.4 (L) 02/15/2021   CO2 31 02/15/2021   GLUCOSE 176 (H) 02/15/2021   BUN 7 02/15/2021   CREATININE 0.97 02/15/2021   BILITOT 0.7 02/10/2021   ALKPHOS 103 02/10/2021   AST 40 02/10/2021   ALT 40 02/10/2021   PROT 6.9 02/10/2021   ALBUMIN 3.3 (L) 02/10/2021   CALCIUM 8.7 (L) 02/15/2021   ANIONGAP 7 02/15/2021   Lab Results  Component Value Date   CHOL 163 02/13/2021   Lab Results  Component Value Date   HDL 32 (L) 02/13/2021   Lab Results  Component Value Date   LDLCALC 105 (H) 02/13/2021   Lab Results  Component Value Date   TRIG 129 02/13/2021   Lab Results  Component Value Date   CHOLHDL 5.1 02/13/2021   Lab Results  Component Value Date   HGBA1C 10.0 (H) 02/11/2021      Assessment & Plan:   Problem List Items Addressed This Visit       Endocrine   Type 2 diabetes mellitus ([place])   Other Visit Diagnoses     History of diverticulitis    -  Primary   Antibiotic-induced yeast infection       Relevant Medications   fluconazole (DIFLUCAN) 150 MG tablet   Thrombocytopenia ([facility])       Relevant Orders   CBC with Differential/Platelet   Cervicovaginal ancillary only   POCT URINALYSIS DIP (CLINITEK) (Completed)   Hypokalemia       Relevant Orders   Comp. Metabolic Panel (12)       Meds ordered this encounter  Medications   fluconazole (DIFLUCAN) 150 MG tablet    Sig: Take 1 tab po once and then repeat in 7 days if needed    Dispense:  2 tablet    Refill:  0    Order Specific Question:   Supervising Provider    Answer:   Joya Gaskins, PATRICK E [[id number]]   1. History of diverticulitis Strongly encourage patient to continue antibiotic regimen as prescribed  2. Antibiotic-induced yeast infection Trial Diflucan, patient education given on supportive care - fluconazole (DIFLUCAN) 150 MG tablet; Take 1 tab po once and then repeat in 7 days if needed  Dispense: 2 tablet; Refill: 0 Cervicovaginal ancillary only - POCT URINALYSIS  DIP (CLINITEK)   3. Thrombocytopenia ([place]) Repeat labs today - CBC with Differential/Platelet - 4. Type 2 diabetes mellitus with other specified complication, without long-term current use of insulin [facility]) Patient education given on low sugar diet, encourage patient to continue with great lifestyle modifications  5. Hypokalemia   - Comp. Metabolic Panel (12)  Patient was given an appointment to establish care at [facility].  Red flags given for prompt reevaluation.   I have reviewed the patient's medical history (PMH, PSH, Social History, Family History, Medications, and allergies) , and have been updated if relevant. I spent 30 minutes reviewing chart and  face to face time with patient.    Follow-up: Return for To establish PCP.    Loraine Grip Mayers, PA-C

## 2021-03-01 ENCOUNTER — Telehealth: Payer: Self-pay | Admitting: *Deleted

## 2021-03-01 LAB — COMP. METABOLIC PANEL (12)
AST: 31 IU/L (ref 0–40)
Albumin/Globulin Ratio: 1.2 (ref 1.2–2.2)
Albumin: 3.9 g/dL (ref 3.8–4.8)
Alkaline Phosphatase: 133 IU/L — ABNORMAL HIGH (ref 44–121)
BUN/Creatinine Ratio: 16 (ref 9–23)
BUN: 14 mg/dL (ref 6–24)
Bilirubin Total: 0.3 mg/dL (ref 0.0–1.2)
Calcium: 9.5 mg/dL (ref 8.7–10.2)
Chloride: 100 mmol/L (ref 96–106)
Creatinine, Ser: 0.85 mg/dL (ref 0.57–1.00)
Globulin, Total: 3.2 g/dL (ref 1.5–4.5)
Glucose: 307 mg/dL — ABNORMAL HIGH (ref 65–99)
Potassium: 4.9 mmol/L (ref 3.5–5.2)
Sodium: 136 mmol/L (ref 134–144)
Total Protein: 7.1 g/dL (ref 6.0–8.5)
eGFR: 86 mL/min/{1.73_m2} (ref >59)

## 2021-03-01 LAB — CBC WITH DIFFERENTIAL/PLATELET
Basophils Absolute: 0.1 10*3/uL (ref 0.0–0.2)
Basos: 1 %
EOS (ABSOLUTE): 0.2 10*3/uL (ref 0.0–0.4)
Eos: 2 %
Hematocrit: 38.4 % (ref 34.0–46.6)
Hemoglobin: 12.6 g/dL (ref 11.1–15.9)
Immature Grans (Abs): 0 10*3/uL (ref 0.0–0.1)
Immature Granulocytes: 0 %
Lymphocytes Absolute: 2.8 10*3/uL (ref 0.7–3.1)
Lymphs: 29 %
MCH: 27 pg (ref 26.6–33.0)
MCHC: 32.8 g/dL (ref 31.5–35.7)
MCV: 82 fL (ref 79–97)
Monocytes Absolute: 0.7 10*3/uL (ref 0.1–0.9)
Monocytes: 7 %
Neutrophils Absolute: 6 10*3/uL (ref 1.4–7.0)
Neutrophils: 61 %
Platelets: 265 10*3/uL (ref 150–450)
RBC: 4.66 x10E6/uL (ref 3.77–5.28)
RDW: 13.4 % (ref 11.7–15.4)
WBC: 9.7 10*3/uL (ref 3.4–10.8)

## 2021-03-01 LAB — CERVICOVAGINAL ANCILLARY ONLY
Bacterial Vaginitis (gardnerella): POSITIVE — AB
Candida Glabrata: NEGATIVE
Candida Vaginitis: POSITIVE — AB
Chlamydia: NEGATIVE
Comment: NEGATIVE
Comment: NEGATIVE
Comment: NEGATIVE
Comment: NEGATIVE
Comment: NEGATIVE
Comment: NORMAL
Neisseria Gonorrhea: NEGATIVE
Trichomonas: NEGATIVE

## 2021-03-01 NOTE — Telephone Encounter (Signed)
-----   Message from Roney Jaffe, [place] sent at 03/01/2021  5:31 AM EDT ----- Please call Patient and let her know that her kidney function is within normal limits, she does have a slightly elevated liver enzyme. They should be rechecked at her next office visit. She does not show signs of anemia.

## 2021-03-01 NOTE — Telephone Encounter (Signed)
Patient verified DOB Patient is aware of labs being normal except for slightly elevated liver enzyme that should be rechecked at the next visit. Patient is also aware of receiving a call once vaginal results have returned.

## 2021-03-02 ENCOUNTER — Telehealth: Payer: Self-pay | Admitting: *Deleted

## 2021-03-02 MED ORDER — METRONIDAZOLE 500 MG PO TABS: 500.0000 mg | ORAL_TABLET | Freq: Three times a day (TID) | ORAL | 0 refills | Status: AC

## 2021-03-02 NOTE — Telephone Encounter (Signed)
Patient verified DOB Patient is aware of BV and yeast being present. Flagyl was sent to [facility] and patient aware of completing medication for 7 days.

## 2021-03-02 NOTE — Telephone Encounter (Signed)
-----   Message from Roney Jaffe, [place] sent at 03/02/2021 10:14 AM EDT ----- Please call Patient and let her know that her cytology did show that she was positive for bacterial vaginosis. She needs to take metronidazole 500 mg twice a day for seven days. She also has positive for yeast infection, but she is being treated for that

## 2021-03-24 ENCOUNTER — Inpatient Hospital Stay (INDEPENDENT_AMBULATORY_CARE_PROVIDER_SITE_OTHER): Payer: Medicaid Other | Admitting: Primary Care

## 2021-04-03 NOTE — Progress Notes (Signed)
Cardiology Office Note:   Date:  04/05/2021  NAME:  Nicole [name]    MRN: [id number] DOB:  08/14/1975   PCP:  [name], MD  Cardiologist:  None  Electrophysiologist:  None   Referring MD: [name], MD   Chief Complaint  Patient presents with   Chest Pain   History of Present Illness:   Nicole [name] is a 45 y.o. female with a hx of obesity (BMI 18), DM, HTN, HLD who presents for follow-up. Admitted in July for diverticulitis. Troponin minimally elevated in flat. CT PE negative and no evidence of coronary calcium. Echo normal.  EKG with normal sinus rhythm and no acute ischemic changes.  She reports she is done well since leaving the hospital.  She reports she is having sharp chest pain at night.  Occurs on the right side of her chest.  Last seconds.  It can occur at night when laying down or with moving around.  Does not appear to be alleviated by rest.  Is not provoked by exertion and can occur at any time.  No significant shortness of breath.  She reports she does exercise.  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  She does take breaks in between clients to do a bit of walking.  Chest pain does not appear to be worsened by this.  She is still smoking.  Smokes roughly half a pack a day.  Has done so for 25 years.  She is trying to quit.  She was diagnosed with diabetes in the hospital.  Cholesterol level not at goal but started on cholesterol medication recently.  Her blood pressure is elevated today 150/92.  Only on losartan 25 mg daily.  Needs to increase this.  She reports heart disease in her maternal grandmother.  No alcohol use reported.  No drug use is reported.  She has 2 children and 1 child still in the house.  No concerns for sleep apnea.  She does report chronic fatigue.  She reports she is tired most of the day.  She reports that her fianc informs her that she does not snore.  Likely no sleep apnea.  She reports she is working on dieting.  She is lost some weight.  Has  plans to lose more.  Problem List DM -A1c 10.0 HTN HLD -T chol 163, HDL 32, LDL 105, TG 129 Morbid obesity -BMI 51 5. Tobacco abuse   Past Medical History: Past Medical History:  Diagnosis Date   Arthritis    knees   Diabetes mellitus without complication ([place])    Hyperlipidemia    Hypertension    Ovarian cyst    Pregnancy induced hypertension     Past Surgical History: Past Surgical History:  Procedure Laterality Date   APPENDECTOMY     CESAREAN SECTION     HERNIA REPAIR     LAPAROSCOPIC APPENDECTOMY N/A 03/25/2014   Procedure: APPENDECTOMY LAPAROSCOPIC;  Surgeon: Gwenyth Ober, MD;  Location: [place];  Service: General;  Laterality: N/A;    Current Medications: Current Meds  Medication Sig   albuterol (VENTOLIN HFA) 108 (90 Base) MCG/ACT inhaler Inhale 2 puffs into the lungs every 6 (six) hours as needed for wheezing or shortness of breath.   atorvastatin (LIPITOR) 80 MG tablet Take 1 tablet (80 mg total) by mouth daily.   blood glucose meter kit and supplies KIT Dispense based on patient and insurance preference. Use up to four times daily as directed.   fluconazole (DIFLUCAN) 150  MG tablet Take 1 tab po once and then repeat in 7 days if needed   glimepiride (AMARYL) 2 MG tablet Take 1 tablet (2 mg total) by mouth daily before breakfast.   HYDROcodone-acetaminophen (NORCO/VICODIN) 5-325 MG tablet Take 1 tablet by mouth every 4 (four) hours as needed for moderate pain.   hydrOXYzine (ATARAX/VISTARIL) 10 MG tablet Take 1 tablet (10 mg total) by mouth 2 (two) times daily as needed.   insulin glargine (LANTUS) 100 UNIT/ML Solostar Pen Inject 20 Units into the skin daily.   metoprolol tartrate (LOPRESSOR) 100 MG tablet Take 1 tablet by mouth once for procedure.   tiotropium (SPIRIVA HANDIHALER) 18 MCG inhalation capsule Place 1 capsule (18 mcg total) into inhaler and inhale daily.   [DISCONTINUED] losartan (COZAAR) 25 MG tablet Take 1 tablet (25 mg total) by mouth daily.      Allergies:    Patient has no known allergies.   Social History: Social History   Socioeconomic History   Marital status: Single    Spouse name: Not on file   Number of children: 2   Years of education: Not on file   Highest education level: Not on file  Occupational History   Not on file  Tobacco Use   Smoking status: Former    Packs/day: 0.50    Years: 27.00    Pack years: 13.50    Types: Cigarettes    Quit date: 10/28/2020    Years since quitting: 0.4   Smokeless tobacco: Never   Tobacco comments:    6 cigs daily  Vaping Use   Vaping Use: Never used  Substance and Sexual Activity   Alcohol use: Not Currently    Comment: occ   Drug use: Yes    Frequency: 7.0 times per week    Types: Marijuana   Sexual activity: Not Currently    Comment: 5 years  Other Topics Concern   Not on file  Social History Narrative   Not on file   Social Determinants of Health   Financial Resource Strain: Not on file  Food Insecurity: No Food Insecurity   Worried About Charity fundraiser in the Last Year: Never true   Ran Out of Food in the Last Year: Never true  Transportation Needs: No Transportation Needs   Lack of Transportation (Medical): No   Lack of Transportation (Non-Medical): No  Physical Activity: Not on file  Stress: Not on file  Social Connections: Not on file     Family History: The patient's family history includes Arthritis in her father; Cancer in her mother; Heart disease in her maternal grandmother; Hypertension in her mother.  ROS:   All other ROS reviewed and negative. Pertinent positives noted in the HPI.     EKGs/Labs/Other Studies Reviewed:   The following studies were personally reviewed by me today:  TTE 02/13/2021   1. Left ventricular ejection fraction, by estimation, is 55 to 60%. The  left ventricle has normal function. Left ventricular endocardial border  not optimally defined to evaluate regional wall motion. There is mild left  ventricular  hypertrophy. Left  ventricular diastolic parameters are consistent with Grade I diastolic  dysfunction (impaired relaxation).   2. Right ventricular systolic function is normal. The right ventricular  size is mildly enlarged.   3. The mitral valve is normal in structure. No evidence of mitral valve  regurgitation. No evidence of mitral stenosis.   4. The aortic valve is normal in structure. Aortic valve regurgitation is  not visualized. No aortic stenosis is present.   5. The inferior vena cava is normal in size with greater than 50%  respiratory variability, suggesting right atrial pressure of 3 mmHg.  Recent Labs: 02/10/2021: ALT 40 02/15/2021: Magnesium 1.9 02/28/2021: BUN 14; Creatinine, Ser 0.85; Hemoglobin 12.6; Platelets 265; Potassium 4.9; Sodium 136   Recent Lipid Panel    Component Value Date/Time   CHOL 163 02/13/2021 0932   CHOL 172 12/15/2019 0933   TRIG 129 02/13/2021 0932   HDL 32 (L) 02/13/2021 0932   HDL 58 12/15/2019 0933   CHOLHDL 5.1 02/13/2021 0932   VLDL 26 02/13/2021 0932   LDLCALC 105 (H) 02/13/2021 0932   LDLCALC 94 12/15/2019 0933    Physical Exam:   VS:  BP (!) 150/92   Pulse 68   Ht 5' 8"  (1.727 m)   Wt (!) 341 lb 12.8 oz (155 kg)   SpO2 97%   BMI 51.97 kg/m    Wt Readings from Last 3 Encounters:  04/05/21 (!) 341 lb 12.8 oz (155 kg)  02/28/21 (!) 337 lb (152.9 kg)  02/10/21 (!) 345 lb 10.9 oz (156.8 kg)    General: Well nourished, well developed, in no acute distress Head: Atraumatic, normal size  Eyes: PEERLA, EOMI  Neck: Supple, no JVD Endocrine: No thryomegaly Cardiac: Normal S1, S2; RRR; no murmurs, rubs, or gallops Lungs: Clear to auscultation bilaterally, no wheezing, rhonchi or rales  Abd: Soft, nontender, no hepatomegaly  Ext: No edema, pulses 2+ Musculoskeletal: No deformities, BUE and BLE strength normal and equal Skin: Warm and dry, no rashes   Neuro: Alert and oriented to person, place, time, and situation, CNII-XII grossly  intact, no focal deficits  Psych: Normal mood and affect   ASSESSMENT:   Nicole [name] is a 45 y.o. female who presents for the following: 1. Chest pain, unspecified type   2. Mixed hyperlipidemia   3. Primary hypertension   4. Tobacco abuse     PLAN:   1. Chest pain, unspecified type -Admitted to the hospital with sepsis and diverticulitis in July 2022.  Had no chest pain symptoms.  Troponin was minimally elevated and flat.  This is also in the setting of newly diagnosed diabetes.  Her EKG was normal at that time.  Echocardiogram showed normal wall motion.  Given her lack of symptoms no further evaluation was recommended and outpatient follow-up was scheduled. -Since that admission she is had right-sided chest pain.  Her CT PE study was negative in the hospital.  She had no evidence of coronary calcium.  She did have a right pleural effusion which was likely related to concerns for pneumonia.  She was treated for this.  She is continue to have right-sided chest pain symptoms.  Described as sharp.  They are not exertional and alleviated by rest.  This appears to be likely noncardiac chest pain.  CVD risk factors include tobacco abuse and diabetes.  For further evaluation I recommended coronary CTA.  She will have to make sure she gets an 18-gauge diffusics catheter.  We will check her BMP today.  We will also check TSH given history of fatigue.  She will take 100 mg of metoprolol tartrate.  I have low suspicion for coronary etiology.  I suspect this is either related to her pneumonia she had in July versus musculoskeletal pain.  2. Mixed hyperlipidemia -Diabetic with A1c of 10.0.  Most recent LDL 105.  Started on statin agent in hospital.  Goal LDL  less than 70 given diabetes.  3. Primary hypertension -BP not well controlled.  Continue losartan.  We will plan to increase the dose to 50 mg daily.  Weight loss and diet recommended.  4. Tobacco abuse -4 minutes of tobacco cessation counseling  was provided in the office.  She was highly encouraged to quit.  I recommended we try nicotine patch or gum.  Disposition: Return in about 6 months (around 10/03/2021).  Medication Adjustments/Labs and Tests Ordered: Current medicines are reviewed at length with the patient today.  Concerns regarding medicines are outlined above.  Orders Placed This Encounter  Procedures   CT CORONARY MORPH W/CTA COR W/SCORE W/CA W/CM &/OR WO/CM   Basic metabolic panel    Meds ordered this encounter  Medications   metoprolol tartrate (LOPRESSOR) 100 MG tablet    Sig: Take 1 tablet by mouth once for procedure.    Dispense:  1 tablet    Refill:  0   losartan (COZAAR) 50 MG tablet    Sig: Take 1 tablet (50 mg total) by mouth daily.    Dispense:  90 tablet    Refill:  1     Patient Instructions  Medication Instructions:  Take Metoprolol 100 mg two hours before the CT when scheduled.  Increase Losartan to 50 mg daily  *If you need a refill on your cardiac medications before your next appointment, please call your pharmacy*   Lab Work: BMET, TSH today   If you have labs (blood work) drawn today and your tests are completely normal, you will receive your results only by: [place] (if you have MyChart) OR A paper copy in the mail If you have any lab test that is abnormal or we need to change your treatment, we will call you to review the results.   Testing/Procedures: Your physician has requested that you have cardiac CT. Cardiac computed tomography (CT) is a painless test that uses an x-ray machine to take clear, detailed pictures of your heart. For further information please visit [email or web address]. Please follow instruction sheet as given.   Follow-Up: At [facility], you and your health needs are our priority.  As part of our continuing mission to provide you with exceptional heart care, we have created designated Provider Care Teams.  These Care Teams include your primary  Cardiologist (physician) and Advanced Practice Providers (APPs -  Physician Assistants and Nurse Practitioners) who all work together to provide you with the care you need, when you need it.  We recommend signing up for the patient portal called "MyChart".  Sign up information is provided on this After Visit Summary.  MyChart is used to connect with patients for Virtual Visits (Telemedicine).  Patients are able to view lab/test results, encounter notes, upcoming appointments, etc.  Non-urgent messages can be sent to your provider as well.   To learn more about what you can do with MyChart, go to [email or web address].    Your next appointment:   6 month(s)  The format for your next appointment:   In Person  Provider:   Eleonore Chiquito, MD   Other Instructions   Your cardiac CT will be scheduled at one of the below locations:   [facility] [address] [place], [place] [address] [phone]   If scheduled at [facility], please arrive at the [facility] main entrance (entrance A) of [facility] 30 minutes prior to test start time. Proceed to the [facility] Radiology Department (first floor) to  check-in and test prep.   Please follow these instructions carefully (unless otherwise directed):   On the Night Before the Test: Be sure to Drink plenty of water. Do not consume any caffeinated/decaffeinated beverages or chocolate 12 hours prior to your test. Do not take any antihistamines 12 hours prior to your test.  On the Day of the Test: Drink plenty of water until 1 hour prior to the test. Do not eat any food 4 hours prior to the test. You may take your regular medications prior to the test.  Take metoprolol (Lopressor) two hours prior to test. HOLD Furosemide/Hydrochlorothiazide morning of the test. FEMALES- please wear underwire-free bra if available, avoid dresses & tight clothing       After the Test: Drink plenty of water. After  receiving IV contrast, you may experience a mild flushed feeling. This is normal. On occasion, you may experience a mild rash up to 24 hours after the test. This is not dangerous. If this occurs, you can take Benadryl 25 mg and increase your fluid intake. If you experience trouble breathing, this can be serious. If it is severe call 911 IMMEDIATELY. If it is mild, please call our office. If you take any of these medications: Glipizide/Metformin, Avandament, Glucavance, please do not take 48 hours after completing test unless otherwise instructed.  Please allow 2-4 weeks for scheduling of routine cardiac CTs. Some insurance companies require a pre-authorization which may delay scheduling of this test.   For non-scheduling related questions, please contact the cardiac imaging nurse navigator should you have any questions/concerns: Marchia Bond, Cardiac Imaging Nurse Navigator Gordy Clement, Cardiac Imaging Nurse Navigator [place] [facility] Direct Office Dial: [phone]   For scheduling needs, including cancellations and rescheduling, please call [place], [phone].    Time Spent with Patient: I have spent a total of 35 minutes with patient reviewing hospital notes, telemetry, EKGs, labs and examining the patient as well as establishing an assessment and plan that was discussed with the patient.  > 50% of time was spent in direct patient care.  Signed, Addison Naegeli. Audie Box, MD, [place]  [address], [place], [place] [address] [phone]  04/05/2021 11:42 AM

## 2021-04-05 ENCOUNTER — Ambulatory Visit (INDEPENDENT_AMBULATORY_CARE_PROVIDER_SITE_OTHER): Payer: Medicaid Other | Admitting: Cardiovascular Disease

## 2021-04-05 ENCOUNTER — Other Ambulatory Visit: Payer: Self-pay

## 2021-04-05 ENCOUNTER — Encounter: Payer: Self-pay | Admitting: Cardiovascular Disease

## 2021-04-05 VITALS — BP 150/92 | HR 68 | Ht 68.0 in | Wt 341.8 lb

## 2021-04-05 DIAGNOSIS — I10 Essential (primary) hypertension: Secondary | ICD-10-CM

## 2021-04-05 DIAGNOSIS — E782 Mixed hyperlipidemia: Secondary | ICD-10-CM | POA: Diagnosis not present

## 2021-04-05 DIAGNOSIS — R079 Chest pain, unspecified: Secondary | ICD-10-CM

## 2021-04-05 DIAGNOSIS — Z720 Tobacco use: Secondary | ICD-10-CM | POA: Diagnosis not present

## 2021-04-05 MED ORDER — METOPROLOL TARTRATE 100 MG PO TABS: ORAL_TABLET | ORAL | 0 refills | Status: DC

## 2021-04-05 MED ORDER — LOSARTAN POTASSIUM 50 MG PO TABS: 50.0000 mg | ORAL_TABLET | Freq: Every day | ORAL | 1 refills | Status: DC

## 2021-04-05 NOTE — Patient Instructions (Addendum)
Medication Instructions:  Take Metoprolol 100 mg two hours before the CT when scheduled.  Increase Losartan to 50 mg daily  *If you need a refill on your cardiac medications before your next appointment, please call your pharmacy*   Lab Work: BMET, TSH today   If you have labs (blood work) drawn today and your tests are completely normal, you will receive your results only by: MyChart Message (if you have MyChart) OR A paper copy in the mail If you have any lab test that is abnormal or we need to change your treatment, we will call you to review the results.   Testing/Procedures: Your physician has requested that you have cardiac CT. Cardiac computed tomography (CT) is a painless test that uses an x-ray machine to take clear, detailed pictures of your heart. For further information please visit [email or web address] Please follow instruction sheet as given.   Follow-Up: At [facility], you and your health needs are our priority.  As part of our continuing mission to provide you with exceptional heart care, we have created designated Provider Care Teams.  These Care Teams include your primary Cardiologist (physician) and Advanced Practice Providers (APPs -  Physician Assistants and Nurse Practitioners) who all work together to provide you with the care you need, when you need it.  We recommend signing up for the patient portal called "MyChart".  Sign up information is provided on this After Visit Summary.  MyChart is used to connect with patients for Virtual Visits (Telemedicine).  Patients are able to view lab/test results, encounter notes, upcoming appointments, etc.  Non-urgent messages can be sent to your provider as well.   To learn more about what you can do with MyChart, go to [email or web address].    Your next appointment:   6 month(s)  The format for your next appointment:   In Person  Provider:   Lennie Odor, MD   Other Instructions   Your cardiac CT will be  scheduled at one of the below locations:   [facility] [address] [place], [place] [address] [phone]   If scheduled at [facility], please arrive at the [facility] main entrance (entrance A) of [facility] 30 minutes prior to test start time. Proceed to the [facility] Radiology Department (first floor) to check-in and test prep.   Please follow these instructions carefully (unless otherwise directed):   On the Night Before the Test: Be sure to Drink plenty of water. Do not consume any caffeinated/decaffeinated beverages or chocolate 12 hours prior to your test. Do not take any antihistamines 12 hours prior to your test.  On the Day of the Test: Drink plenty of water until 1 hour prior to the test. Do not eat any food 4 hours prior to the test. You may take your regular medications prior to the test.  Take metoprolol (Lopressor) two hours prior to test. HOLD Furosemide/Hydrochlorothiazide morning of the test. FEMALES- please wear underwire-free bra if available, avoid dresses & tight clothing       After the Test: Drink plenty of water. After receiving IV contrast, you may experience a mild flushed feeling. This is normal. On occasion, you may experience a mild rash up to 24 hours after the test. This is not dangerous. If this occurs, you can take Benadryl 25 mg and increase your fluid intake. If you experience trouble breathing, this can be serious. If it is severe call 911 IMMEDIATELY. If it is mild, please call our office.  If you take any of these medications: Glipizide/Metformin, Avandament, Glucavance, please do not take 48 hours after completing test unless otherwise instructed.  Please allow 2-4 weeks for scheduling of routine cardiac CTs. Some insurance companies require a pre-authorization which may delay scheduling of this test.   For non-scheduling related questions, please contact the cardiac imaging nurse navigator should you  have any questions/concerns: Marchia Bond, Cardiac Imaging Nurse Navigator Gordy Clement, Cardiac Imaging Nurse Navigator [place] [facility] Direct Office Dial: [phone]   For scheduling needs, including cancellations and rescheduling, please call [place], [phone].

## 2021-04-06 ENCOUNTER — Other Ambulatory Visit: Payer: Self-pay | Admitting: Cardiovascular Disease

## 2021-04-06 LAB — BASIC METABOLIC PANEL
BUN/Creatinine Ratio: 13 (ref 9–23)
BUN: 11 mg/dL (ref 6–24)
CO2: 26 mmol/L (ref 20–29)
Calcium: 9.6 mg/dL (ref 8.7–10.2)
Chloride: 100 mmol/L (ref 96–106)
Creatinine, Ser: 0.85 mg/dL (ref 0.57–1.00)
Glucose: 183 mg/dL — ABNORMAL HIGH (ref 65–99)
Potassium: 5 mmol/L (ref 3.5–5.2)
Sodium: 139 mmol/L (ref 134–144)
eGFR: 86 mL/min/{1.73_m2} (ref >59)

## 2021-04-10 ENCOUNTER — Telehealth (HOSPITAL_COMMUNITY): Payer: Self-pay | Admitting: *Deleted

## 2021-04-10 NOTE — Telephone Encounter (Signed)
Reaching out to patient to offer assistance regarding upcoming cardiac imaging study; pt verbalizes understanding of appt date/time, parking situation and where to check in, pre-test NPO status and medications ordered, and verified current allergies; name and call back number provided for further questions should they arise  Larey Brick RN Navigator Cardiac Imaging [facility] [phone] office [phone] cell  Patient to take 100mg  metoprolol tartrate two hours prior to cardiac CT. She reports that she is a difficult IV start.

## 2021-04-12 ENCOUNTER — Ambulatory Visit (HOSPITAL_COMMUNITY): Admission: RE | Admit: 2021-04-12 | Payer: Medicaid Other | Source: Ambulatory Visit

## 2021-04-18 ENCOUNTER — Telehealth (HOSPITAL_COMMUNITY): Payer: Self-pay | Admitting: Emergency Medicine

## 2021-04-18 NOTE — Telephone Encounter (Signed)
Reaching out to patient to offer assistance regarding upcoming cardiac imaging study; pt verbalizes understanding of appt date/time, parking situation and where to check in, pre-test NPO status and medications ordered, and verified current allergies; name and call back number provided for further questions should they arise Rockwell Alexandria RN Navigator Cardiac Imaging [facility] [phone] office [phone] cell  Difficult IV 100mg  metoprolol

## 2021-04-19 ENCOUNTER — Telehealth (HOSPITAL_COMMUNITY): Payer: Self-pay | Admitting: *Deleted

## 2021-04-19 ENCOUNTER — Ambulatory Visit (HOSPITAL_COMMUNITY): Admission: RE | Admit: 2021-04-19 | Payer: Medicaid Other | Source: Ambulatory Visit

## 2021-04-19 NOTE — Telephone Encounter (Signed)
Received phone call that patient is unable to make her cardiac CT appointment today since she has developed a cough that gets worse when lying flat. Patient has been rescheduled to October 4 at 10am per her request.  Larey Brick, RN navigator Cardiac Imaging [phone]

## 2021-04-27 NOTE — Progress Notes (Signed)
Erroneous encounter

## 2021-04-28 ENCOUNTER — Telehealth (HOSPITAL_COMMUNITY): Payer: Self-pay | Admitting: Emergency Medicine

## 2021-04-28 NOTE — Telephone Encounter (Signed)
Reaching out to patient to offer assistance regarding upcoming cardiac imaging study; pt verbalizes understanding of appt date/time, parking situation and where to check in, pre-test NPO status and medications ordered, and verified current allergies; name and call back number provided for further questions should they arise Rockwell Alexandria RN Navigator Cardiac Imaging [facility] [phone] office [phone] cell   Difficult IV 100mg  metoprolol (pt states still needs to pick up from pharm)

## 2021-05-01 ENCOUNTER — Encounter: Payer: Medicaid Other | Admitting: Family

## 2021-05-01 DIAGNOSIS — Z7689 Persons encountering health services in other specified circumstances: Secondary | ICD-10-CM

## 2021-05-02 ENCOUNTER — Ambulatory Visit (HOSPITAL_COMMUNITY): Payer: Medicaid Other

## 2021-05-02 ENCOUNTER — Telehealth (HOSPITAL_COMMUNITY): Payer: Self-pay | Admitting: Emergency Medicine

## 2021-05-02 ENCOUNTER — Other Ambulatory Visit (HOSPITAL_COMMUNITY): Payer: Self-pay | Admitting: Emergency Medicine

## 2021-05-02 DIAGNOSIS — R079 Chest pain, unspecified: Secondary | ICD-10-CM

## 2021-05-02 MED ORDER — METOPROLOL TARTRATE 100 MG PO TABS: ORAL_TABLET | ORAL | 0 refills | Status: DC

## 2021-05-02 NOTE — Telephone Encounter (Signed)
Attempted to call patient to let her know that the scanner unexpectedly went down and we are having to cancel her appt.   Unable to leave vm as her vm box is full.  Rockwell Alexandria RN Navigator Cardiac Imaging [facility] [phone] Office  [phone] Cell

## 2021-05-08 ENCOUNTER — Ambulatory Visit (HOSPITAL_COMMUNITY): Payer: Medicaid Other

## 2021-05-12 ENCOUNTER — Telehealth (HOSPITAL_COMMUNITY): Payer: Self-pay | Admitting: Emergency Medicine

## 2021-05-12 DIAGNOSIS — R079 Chest pain, unspecified: Secondary | ICD-10-CM

## 2021-05-12 NOTE — Telephone Encounter (Signed)
Reaching out to patient to offer assistance regarding upcoming cardiac imaging study; pt verbalizes understanding of appt date/time, parking situation and where to check in, pre-test NPO status and medications ordered, and verified current allergies; name and call back number provided for further questions should they arise Rockwell Alexandria RN Navigator Cardiac Imaging [facility] [phone] office [phone] cell  100mg  metop Difficult IV- needs 18g due to body habitus Informed that we need new labs for upcoming appt, BMP order placed, pt states she will go monday

## 2021-05-16 ENCOUNTER — Ambulatory Visit (HOSPITAL_COMMUNITY): Admission: RE | Admit: 2021-05-16 | Payer: Medicaid Other | Source: Ambulatory Visit

## 2021-07-18 ENCOUNTER — Telehealth: Payer: Self-pay | Admitting: Family Medicine

## 2021-07-18 NOTE — Telephone Encounter (Signed)
..  Patient declines further follow up and engagement by the Managed Medicaid Team. Appropriate care team members and provider have been notified via electronic communication. The Managed Medicaid Team is available to follow up with the patient after provider conversation with the patient regarding recommendation for engagement and subsequent re-referral to the Managed Medicaid Team.    Jennifer Alley Care Guide, High Risk Medicaid Managed Care Embedded Care Coordination [place]  [facility]   

## 2021-08-18 ENCOUNTER — Other Ambulatory Visit: Payer: Medicaid Other | Admitting: *Deleted

## 2021-08-18 ENCOUNTER — Other Ambulatory Visit: Payer: Self-pay

## 2021-08-18 NOTE — Patient Outreach (Signed)
Care Coordination  08/18/2021  DARLISHA KELM May 17, 1976 [id number]  EVELLA KASAL was referred to the [facility] Managed Care High Risk team for assistance with care coordination and care management services. Care coordination/care management services as part of the Medicaid benefit was offered to the patient. Per J. Alley's communication on 07/18/21, the patient declined assistance offered.   RNCM performing Case Closure today.  Plan: The Medicaid Managed Care High Risk team is available at any time in the future to assist with care coordination/care management services upon referral.   Estanislado Emms RN, BSN [place]   [facility] RN Care Coordinator

## 2021-08-28 ENCOUNTER — Ambulatory Visit (INDEPENDENT_AMBULATORY_CARE_PROVIDER_SITE_OTHER): Payer: Medicaid Other

## 2021-08-28 ENCOUNTER — Other Ambulatory Visit: Payer: Self-pay

## 2021-08-28 ENCOUNTER — Encounter (HOSPITAL_COMMUNITY): Payer: Self-pay | Admitting: *Deleted

## 2021-08-28 ENCOUNTER — Ambulatory Visit (HOSPITAL_COMMUNITY)
Admission: EM | Admit: 2021-08-28 | Discharge: 2021-08-28 | Disposition: A | Discharge status: Home / Self Care | Payer: Medicaid Other | Attending: Internal Medicine | Admitting: Internal Medicine

## 2021-08-28 DIAGNOSIS — J441 Chronic obstructive pulmonary disease with (acute) exacerbation: Secondary | ICD-10-CM | POA: Diagnosis not present

## 2021-08-28 DIAGNOSIS — N760 Acute vaginitis: Secondary | ICD-10-CM

## 2021-08-28 DIAGNOSIS — J9801 Acute bronchospasm: Secondary | ICD-10-CM | POA: Diagnosis not present

## 2021-08-28 DIAGNOSIS — B9689 Other specified bacterial agents as the cause of diseases classified elsewhere: Secondary | ICD-10-CM

## 2021-08-28 DIAGNOSIS — Z720 Tobacco use: Secondary | ICD-10-CM

## 2021-08-28 DIAGNOSIS — R059 Cough, unspecified: Secondary | ICD-10-CM | POA: Diagnosis not present

## 2021-08-28 MED ORDER — BUDESONIDE-FORMOTEROL FUMARATE 160-4.5 MCG/ACT IN AERO: 2.0000 | INHALATION_SPRAY | Freq: Every day | RESPIRATORY_TRACT | 0 refills | Status: DC

## 2021-08-28 MED ORDER — DOXYCYCLINE HYCLATE 100 MG PO CAPS: 100.0000 mg | ORAL_CAPSULE | Freq: Two times a day (BID) | ORAL | 0 refills | Status: AC

## 2021-08-28 MED ORDER — PREDNISONE 20 MG PO TABS: 40.0000 mg | ORAL_TABLET | Freq: Every day | ORAL | 0 refills | Status: AC

## 2021-08-28 MED ORDER — METRONIDAZOLE 500 MG PO TABS: 500.0000 mg | ORAL_TABLET | Freq: Two times a day (BID) | ORAL | 0 refills | Status: DC

## 2021-08-28 NOTE — ED Provider Notes (Addendum)
[place]    CSN: [id number] Arrival date & time: 08/28/21  1511      History   Chief Complaint Chief Complaint  Patient presents with   Cough    HPI BAYLER NEHRING is a 46 y.o. female with a history of COPD comes to the urgent care with 4-day history of shortness of breath, wheezing, cough productive of bloody sputum.  Patient says her symptoms started insidiously and has been persistent.  No fever or chills.  No nausea or vomiting.  No diarrhea.  No weight loss.  Patient smokes about quarter pack a day.  She has done that for several years.  She is currently on albuterol and Spiriva for COPD  Patient complains of daily vaginal discharge with fishy smell.  Patient denies any dysuria urgency or frequency.  Symptoms have been ongoing for several days.  She has a history of recurrent bacterial vaginosis.  She denies douching.  No abdominal pain or cramps.Marland Kitchen   HPI  Past Medical History:  Diagnosis Date   Arthritis    knees   Diabetes mellitus without complication ([place])    Hyperlipidemia    Hypertension    Ovarian cyst    Pregnancy induced hypertension     Patient Active Problem List   Diagnosis Date Noted   History of diverticulitis 02/28/2021   Antibiotic-induced yeast infection 02/28/2021   Thrombocytopenia ([place]) 02/28/2021   Hypokalemia 02/28/2021   Sigmoid diverticulitis 02/11/2021   Bacterial vaginitis 12/19/2020   Anxiety and depression 06/11/2018   Vitamin D deficiency 01/08/2018   Osteoarthritis 01/08/2018   Type 2 diabetes mellitus ([place]) 01/08/2018   Morbid obesity ([place]) 09/06/2016   Acute appendicitis 03/25/2014   Hypertension 03/25/2014    Past Surgical History:  Procedure Laterality Date   APPENDECTOMY     CESAREAN Fitzgerald N/A 03/25/2014   Procedure: APPENDECTOMY LAPAROSCOPIC;  Surgeon: Gwenyth Ober, MD;  Location: [facility] OR;  Service: General;  Laterality: N/A;    OB History     Gravida   2   Para  2   Term  1   Preterm  1   AB  0   Living  2      SAB      IAB      Ectopic      Multiple      Live Births  2            Home Medications    Prior to Admission medications   Medication Sig Start Date End Date Taking? Authorizing Provider  budesonide-formoterol (SYMBICORT) 160-4.5 MCG/ACT inhaler Inhale 2 puffs into the lungs daily. 08/28/21  Yes Kaja Jackowski, Myrene Galas, MD  doxycycline (VIBRAMYCIN) 100 MG capsule Take 1 capsule (100 mg total) by mouth 2 (two) times daily for 7 days. 08/28/21 09/04/21 Yes Saundra Gin, Myrene Galas, MD  metroNIDAZOLE (FLAGYL) 500 MG tablet Take 1 tablet (500 mg total) by mouth 2 (two) times daily. 08/28/21  Yes Lizette Pazos, Myrene Galas, MD  predniSONE (DELTASONE) 20 MG tablet Take 2 tablets (40 mg total) by mouth daily for 5 days. 08/28/21 09/02/21 Yes Isabellarose Kope, Myrene Galas, MD  albuterol (VENTOLIN HFA) 108 (90 Base) MCG/ACT inhaler Inhale 2 puffs into the lungs every 6 (six) hours as needed for wheezing or shortness of breath. 04/20/20   [name], MD  atorvastatin (LIPITOR) 80 MG tablet Take 1 tablet (80 mg total) by mouth daily. 02/16/21   Avon Gully,  Luanna Cole, MD  blood glucose meter kit and supplies KIT Dispense based on patient and insurance preference. Use up to four times daily as directed. 02/15/21   Little Ishikawa, MD  fluconazole (DIFLUCAN) 150 MG tablet Take 1 tab po once and then repeat in 7 days if needed 02/28/21   Mayers, Cari S, PA-C  glimepiride (AMARYL) 2 MG tablet Take 1 tablet (2 mg total) by mouth daily before breakfast. 05/25/20   [name], MD  HYDROcodone-acetaminophen (NORCO/VICODIN) 5-325 MG tablet Take 1 tablet by mouth every 4 (four) hours as needed for moderate pain. 02/15/21 02/15/22  Little Ishikawa, MD  hydrOXYzine (ATARAX/VISTARIL) 10 MG tablet Take 1 tablet (10 mg total) by mouth 2 (two) times daily as needed. 06/15/19   [name], MD  insulin glargine (LANTUS) 100 UNIT/ML Solostar Pen Inject 20 Units into  the skin daily. 02/15/21   Little Ishikawa, MD  losartan (COZAAR) 50 MG tablet Take 1 tablet (50 mg total) by mouth daily. 04/05/21   O'NealCassie Freer, MD  metoprolol tartrate (LOPRESSOR) 100 MG tablet Take 1 tablet by mouth once for procedure. 05/02/21   Geralynn Rile, MD  tiotropium (SPIRIVA HANDIHALER) 18 MCG inhalation capsule Place 1 capsule (18 mcg total) into inhaler and inhale daily. 06/15/19   [name], MD  metoprolol tartrate (LOPRESSOR) 100 MG tablet Take 1 tablet by mouth once for procedure. 04/05/21   O'Neal, Cassie Freer, MD    Family History Family History  Problem Relation Age of Onset   Hypertension Mother    Cancer Mother        pancreatic cancer   Arthritis Father    Heart disease Maternal Grandmother     Social History Social History   Tobacco Use   Smoking status: Former    Packs/day: 0.50    Years: 27.00    Pack years: 13.50    Types: Cigarettes    Quit date: 10/28/2020    Years since quitting: 0.8   Smokeless tobacco: Never   Tobacco comments:    6 cigs daily  Vaping Use   Vaping Use: Never used  Substance Use Topics   Alcohol use: Not Currently    Comment: occ   Drug use: Yes    Frequency: 7.0 times per week    Types: Marijuana     Allergies   Patient has no known allergies.   Review of Systems Review of Systems  HENT: Negative.    Respiratory: Negative.    Gastrointestinal: Negative.  Negative for abdominal pain.  Genitourinary:  Positive for vaginal discharge. Negative for dysuria, frequency, urgency, vaginal bleeding and vaginal pain.    Physical Exam Triage Vital Signs ED Triage Vitals  Enc Vitals Group     BP 08/28/21 1654 (!) 148/91     Pulse Rate 08/28/21 1654 77     Resp 08/28/21 1654 20     Temp 08/28/21 1654 98.5 F (36.9 C)     Temp src --      SpO2 08/28/21 1654 97 %     Weight --      Height --      Head Circumference --      Peak Flow --      Pain Score 08/28/21 1651 8     Pain Loc --       Pain Edu? --      Excl. in [place]? --    No data found.  Updated Vital Signs BP (!) 148/91  Pulse 77    Temp 98.5 F (36.9 C)    Resp 20    LMP 08/21/2021    SpO2 97%   Visual Acuity Right Eye Distance:   Left Eye Distance:   Bilateral Distance:    Right Eye Near:   Left Eye Near:    Bilateral Near:     Physical Exam   UC Treatments / Results  Labs (all labs ordered are listed, but only abnormal results are displayed) Labs Reviewed - No data to display  EKG   Radiology No results found.  Procedures Procedures (including critical care time)  Medications Ordered in UC Medications - No data to display  Initial Impression / Assessment and Plan / UC Course  I have reviewed the triage vital signs and the nursing notes.  Pertinent labs & imaging results that were available during my care of the patient were reviewed by me and considered in my medical decision making (see chart for details).     1.  COPD with acute exacerbation, Use albuterol inhaler Add Symbicort to the treatment regimen Doxycycline 100 mg twice daily for 7 days Prednisone 40 mg daily for 5 days Return precautions given Chest x-ray is negative for acute lung infiltrate  2.  Bacterial vaginosis Flagyl 500 mg twice daily for 7 days  Final Clinical Impressions(s) / UC Diagnoses   Final diagnoses:  COPD with acute exacerbation ([place])  Bacterial vaginosis     Discharge Instructions      Please take medications as prescribed Chest x-ray is negative for lung infiltrate We will call you with recommendations  Return to urgent care if symptoms worsen.   ED Prescriptions     Medication Sig Dispense Auth. Provider   budesonide-formoterol (SYMBICORT) 160-4.5 MCG/ACT inhaler Inhale 2 puffs into the lungs daily. 1 each Eather Chaires, Myrene Galas, MD   doxycycline (VIBRAMYCIN) 100 MG capsule Take 1 capsule (100 mg total) by mouth 2 (two) times daily for 7 days. 14 capsule Karielle Davidow, Myrene Galas, MD   predniSONE  (DELTASONE) 20 MG tablet Take 2 tablets (40 mg total) by mouth daily for 5 days. 10 tablet Renard Caperton, Myrene Galas, MD   metroNIDAZOLE (FLAGYL) 500 MG tablet Take 1 tablet (500 mg total) by mouth 2 (two) times daily. 14 tablet Lynne Takemoto, Myrene Galas, MD      PDMP not reviewed this encounter.   Chase Picket, MD 08/28/21 1746    Chase Picket, MD 08/28/21 [phone]

## 2021-08-28 NOTE — Discharge Instructions (Signed)
Please take medications as prescribed Chest x-ray is negative for lung infiltrate We will call you with recommendations  Return to urgent care if symptoms worsen.

## 2021-08-28 NOTE — ED Triage Notes (Signed)
Pt reports being sick for 2 weeks and feels . Pt feels super dizzy with congestion. Pt reports she has a double DX because she has BV also.

## 2021-09-06 ENCOUNTER — Emergency Department (HOSPITAL_COMMUNITY): Payer: Medicaid Other

## 2021-09-06 ENCOUNTER — Other Ambulatory Visit: Payer: Self-pay

## 2021-09-06 ENCOUNTER — Emergency Department (HOSPITAL_COMMUNITY)
Admission: EM | Admit: 2021-09-06 | Discharge: 2021-09-06 | Disposition: A | Discharge status: Home / Self Care | Payer: Medicaid Other | Attending: Emergency Medicine | Admitting: Emergency Medicine

## 2021-09-06 ENCOUNTER — Encounter (HOSPITAL_COMMUNITY): Payer: Self-pay | Admitting: Emergency Medicine

## 2021-09-06 DIAGNOSIS — J449 Chronic obstructive pulmonary disease, unspecified: Secondary | ICD-10-CM | POA: Insufficient documentation

## 2021-09-06 DIAGNOSIS — Z7984 Long term (current) use of oral hypoglycemic drugs: Secondary | ICD-10-CM | POA: Insufficient documentation

## 2021-09-06 DIAGNOSIS — N9489 Other specified conditions associated with female genital organs and menstrual cycle: Secondary | ICD-10-CM | POA: Diagnosis not present

## 2021-09-06 DIAGNOSIS — I160 Hypertensive urgency: Secondary | ICD-10-CM | POA: Diagnosis not present

## 2021-09-06 DIAGNOSIS — I517 Cardiomegaly: Secondary | ICD-10-CM | POA: Diagnosis not present

## 2021-09-06 DIAGNOSIS — R079 Chest pain, unspecified: Secondary | ICD-10-CM

## 2021-09-06 DIAGNOSIS — Z794 Long term (current) use of insulin: Secondary | ICD-10-CM | POA: Diagnosis not present

## 2021-09-06 DIAGNOSIS — Z9114 Patient's other noncompliance with medication regimen: Secondary | ICD-10-CM | POA: Diagnosis not present

## 2021-09-06 DIAGNOSIS — Z7951 Long term (current) use of inhaled steroids: Secondary | ICD-10-CM | POA: Diagnosis not present

## 2021-09-06 DIAGNOSIS — I10 Essential (primary) hypertension: Secondary | ICD-10-CM | POA: Insufficient documentation

## 2021-09-06 DIAGNOSIS — R0602 Shortness of breath: Secondary | ICD-10-CM | POA: Diagnosis not present

## 2021-09-06 DIAGNOSIS — Z79899 Other long term (current) drug therapy: Secondary | ICD-10-CM | POA: Diagnosis not present

## 2021-09-06 DIAGNOSIS — E1165 Type 2 diabetes mellitus with hyperglycemia: Secondary | ICD-10-CM

## 2021-09-06 DIAGNOSIS — Z91148 Patient's other noncompliance with medication regimen for other reason: Secondary | ICD-10-CM

## 2021-09-06 DIAGNOSIS — R0789 Other chest pain: Secondary | ICD-10-CM | POA: Diagnosis not present

## 2021-09-06 LAB — BASIC METABOLIC PANEL
Anion gap: 9 (ref 5–15)
BUN: 17 mg/dL (ref 6–20)
CO2: 26 mmol/L (ref 22–32)
Calcium: 9.4 mg/dL (ref 8.9–10.3)
Chloride: 103 mmol/L (ref 98–111)
Creatinine, Ser: 1.39 mg/dL — ABNORMAL HIGH (ref 0.44–1.00)
GFR, Estimated: 47 mL/min — ABNORMAL LOW (ref >60)
Glucose, Bld: 293 mg/dL — ABNORMAL HIGH (ref 70–99)
Potassium: 4.2 mmol/L (ref 3.5–5.1)
Sodium: 138 mmol/L (ref 135–145)

## 2021-09-06 LAB — CBC
HCT: 43.7 % (ref 36.0–46.0)
Hemoglobin: 13.4 g/dL (ref 12.0–15.0)
MCH: 26 pg (ref 26.0–34.0)
MCHC: 30.7 g/dL (ref 30.0–36.0)
MCV: 84.7 fL (ref 80.0–100.0)
Platelets: 242 10*3/uL (ref 150–400)
RBC: 5.16 MIL/uL — ABNORMAL HIGH (ref 3.87–5.11)
RDW: 14 % (ref 11.5–15.5)
WBC: 11.1 10*3/uL — ABNORMAL HIGH (ref 4.0–10.5)
nRBC: 0 % (ref 0.0–0.2)

## 2021-09-06 LAB — BRAIN NATRIURETIC PEPTIDE: B Natriuretic Peptide: 10.5 pg/mL (ref 0.0–100.0)

## 2021-09-06 LAB — I-STAT BETA HCG BLOOD, ED (MC, WL, AP ONLY): I-stat hCG, quantitative: <5 m[IU]/mL (ref <5)

## 2021-09-06 LAB — TROPONIN I (HIGH SENSITIVITY)
Troponin I (High Sensitivity): 6 ng/L (ref <18)
Troponin I (High Sensitivity): 6 ng/L (ref <18)

## 2021-09-06 MED ORDER — INSULIN ASPART 100 UNIT/ML IJ SOLN
10.0000 [IU] | Freq: Once | INTRAMUSCULAR | Status: AC
  Administered 2021-09-06: 10 [IU] via SUBCUTANEOUS

## 2021-09-06 MED ORDER — FREESTYLE LITE TEST VI STRP: ORAL_STRIP | 12 refills | Status: DC

## 2021-09-06 MED ORDER — ASPIRIN 81 MG PO CHEW: 324.0000 mg | CHEWABLE_TABLET | Freq: Once | ORAL | Status: DC

## 2021-09-06 MED ORDER — SODIUM CHLORIDE 0.9 % IV BOLUS
1000.0000 mL | Freq: Once | INTRAVENOUS | Status: AC
Start: 2021-09-06 — End: 2021-09-06
  Administered 2021-09-06: 1000 mL via INTRAVENOUS

## 2021-09-06 MED ORDER — ATORVASTATIN CALCIUM 80 MG PO TABS: 80.0000 mg | ORAL_TABLET | Freq: Every day | ORAL | 0 refills | Status: DC

## 2021-09-06 MED ORDER — METOPROLOL TARTRATE 100 MG PO TABS: 100.0000 mg | ORAL_TABLET | Freq: Every day | ORAL | 0 refills | Status: DC

## 2021-09-06 MED ORDER — METOPROLOL TARTRATE 25 MG PO TABS
100.0000 mg | ORAL_TABLET | Freq: Once | ORAL | Status: AC
  Administered 2021-09-06: 100 mg via ORAL
  Filled 2021-09-06: qty 4

## 2021-09-06 MED ORDER — ATORVASTATIN CALCIUM 40 MG PO TABS
80.0000 mg | ORAL_TABLET | Freq: Every day | ORAL | Status: DC
  Administered 2021-09-06: 80 mg via ORAL
  Filled 2021-09-06: qty 2

## 2021-09-06 NOTE — ED Provider Notes (Addendum)
[facility] EMERGENCY DEPARTMENT Provider Note   CSN: [id number] Arrival date & time: 09/06/21  0141     History  Chief Complaint  Patient presents with   Chest Pain   Shortness of Breath    Nicole [name] is a 46 y.o. female.  Patient is a 46 yo female with pmh of copd, dm, hypertension, hyperlipidemia presenting for sob and cp. Pt admits to episodes of chest pain on left side with radiation to left face and bilateral arms, intermittent in nature, pressure-like, x 3 days occurring at rest and present currently.  Pt admits to recent hx of nasal congestion, cough, and sob on Monday. Symptoms resolved after docycyline by urgent care for concerns for pneumonia with negative cxr for pneumonia. No longer smoking. Does not take any inhalers at home.   Of note, pt has not seen pcp or taken any home medications in a year. No taking home prescription of cozzar or metoprolol. States she intermittently takes her long acting insulin. Admits to increased thirst, urinary frequency, and feelings of dehydration".  The history is provided by the patient. No language interpreter was used.  Chest Pain Associated symptoms: shortness of breath   Associated symptoms: no abdominal pain, no back pain, no cough, no fever, no palpitations and no vomiting   Shortness of Breath Associated symptoms: chest pain   Associated symptoms: no abdominal pain, no cough, no ear pain, no fever, no rash, no sore throat and no vomiting       Home Medications Prior to Admission medications   Medication Sig Start Date End Date Taking? Authorizing Provider  albuterol (VENTOLIN HFA) 108 (90 Base) MCG/ACT inhaler Inhale 2 puffs into the lungs every 6 (six) hours as needed for wheezing or shortness of breath. 04/20/20   [name], MD  atorvastatin (LIPITOR) 80 MG tablet Take 1 tablet (80 mg total) by mouth daily. 02/16/21   Little Ishikawa, MD  blood glucose meter kit and supplies KIT Dispense based  on patient and insurance preference. Use up to four times daily as directed. 02/15/21   Little Ishikawa, MD  budesonide-formoterol [facility]) 160-4.5 MCG/ACT inhaler Inhale 2 puffs into the lungs daily. 08/28/21   Chase Picket, MD  fluconazole (DIFLUCAN) 150 MG tablet Take 1 tab po once and then repeat in 7 days if needed 02/28/21   Mayers, Cari S, PA-C  glimepiride (AMARYL) 2 MG tablet Take 1 tablet (2 mg total) by mouth daily before breakfast. 05/25/20   [name], MD  HYDROcodone-acetaminophen (NORCO/VICODIN) 5-325 MG tablet Take 1 tablet by mouth every 4 (four) hours as needed for moderate pain. 02/15/21 02/15/22  Little Ishikawa, MD  hydrOXYzine (ATARAX/VISTARIL) 10 MG tablet Take 1 tablet (10 mg total) by mouth 2 (two) times daily as needed. 06/15/19   [name], MD  insulin glargine (LANTUS) 100 UNIT/ML Solostar Pen Inject 20 Units into the skin daily. 02/15/21   Little Ishikawa, MD  losartan (COZAAR) 50 MG tablet Take 1 tablet (50 mg total) by mouth daily. 04/05/21   O'NealCassie Freer, MD  metoprolol tartrate (LOPRESSOR) 100 MG tablet Take 1 tablet by mouth once for procedure. 05/02/21   O'Neal, Cassie Freer, MD  metroNIDAZOLE (FLAGYL) 500 MG tablet Take 1 tablet (500 mg total) by mouth 2 (two) times daily. 08/28/21   Chase Picket, MD  tiotropium (SPIRIVA HANDIHALER) 18 MCG inhalation capsule Place 1 capsule (18 mcg total) into inhaler and inhale daily. 06/15/19   Newlin,  Enobong, MD  metoprolol tartrate (LOPRESSOR) 100 MG tablet Take 1 tablet by mouth once for procedure. 04/05/21   O'Neal, Cassie Freer, MD      Allergies    Patient has no known allergies.    Review of Systems   Review of Systems  Constitutional:  Negative for chills and fever.  HENT:  Negative for ear pain and sore throat.   Eyes:  Negative for pain and visual disturbance.  Respiratory:  Positive for shortness of breath. Negative for cough.   Cardiovascular:  Positive for chest pain.  Negative for palpitations.  Gastrointestinal:  Negative for abdominal pain and vomiting.  Endocrine: Positive for polydipsia and polyuria.  Genitourinary:  Negative for dysuria and hematuria.  Musculoskeletal:  Negative for arthralgias and back pain.  Skin:  Negative for color change and rash.  Neurological:  Negative for seizures and syncope.  All other systems reviewed and are negative.  Physical Exam Updated Vital Signs BP (!) 172/96    Pulse 68    Temp 98.2 F (36.8 C)    Resp 14    LMP 08/21/2021    SpO2 100%  Physical Exam Vitals and nursing note reviewed.  Constitutional:      General: She is not in acute distress.    Appearance: She is well-developed.  HENT:     Head: Normocephalic and atraumatic.  Eyes:     Conjunctiva/sclera: Conjunctivae normal.  Cardiovascular:     Rate and Rhythm: Normal rate and regular rhythm.     Heart sounds: No murmur heard. Pulmonary:     Effort: Pulmonary effort is normal. No respiratory distress.     Breath sounds: Normal breath sounds.  Abdominal:     Palpations: Abdomen is soft.     Tenderness: There is no abdominal tenderness.  Musculoskeletal:        General: No swelling.     Cervical back: Neck supple.  Skin:    General: Skin is warm and dry.     Capillary Refill: Capillary refill takes less than 2 seconds.  Neurological:     Mental Status: She is alert.  Psychiatric:        Mood and Affect: Mood normal.    ED Results / Procedures / Treatments   Labs (all labs ordered are listed, but only abnormal results are displayed) Labs Reviewed  BASIC METABOLIC PANEL - Abnormal; Notable for the following components:      Result Value   Glucose, Bld 293 (*)    Creatinine, Ser 1.39 (*)    GFR, Estimated 47 (*)    All other components within normal limits  CBC - Abnormal; Notable for the following components:   WBC 11.1 (*)    RBC 5.16 (*)    All other components within normal limits  BRAIN NATRIURETIC PEPTIDE  I-STAT BETA HCG  BLOOD, ED ([facility], [facility], [facility] ONLY)  TROPONIN I (HIGH SENSITIVITY)  TROPONIN I (HIGH SENSITIVITY)    EKG None  Radiology DG Chest 2 View  Result Date: 09/06/2021 CLINICAL DATA:  Shortness of breath and left chest pain. EXAM: CHEST - 2 VIEW COMPARISON:  PA Lat 08/28/2021 FINDINGS: There is mild stable cardiomegaly. Mediastinal contours are within normal limits. Both lungs are clear. The visualized skeletal structures are unremarkable aside from thoracic spondylosis. IMPRESSION: No active cardiopulmonary disease.  Stable chest with cardiomegaly. Electronically Signed   By: Telford Nab M.D.   On: 09/06/2021 02:43    Procedures Procedures    Medications Ordered in ED  Medications  aspirin chewable tablet 324 mg (has no administration in time range)    ED Course/ Medical Decision Making/ A&P                           Medical Decision Making Risk OTC drugs. Prescription drug management.   3:01 PM 46 yo female with pmh of copd, dm, hypertension, hyperlipidemia presenting for sob and chest pain.   The patient's chest pain is not suggestive of pulmonary embolus, cardiac ischemia, aortic dissection, pericarditis, myocarditis, pulmonary embolism (PERC criteria negative), pneumothorax, pneumonia, Zoster, or esophageal perforation, or other serious etiology.  Historically not abrupt in onset, tearing or ripping, pulses symmetric. EKG nonspecific for ischemia/infarction. No dysrhythmias, brugada, WPW, prolonged QT noted. CXR reviewed and WNL. Troponin negative. CXR reviewed. Labs without demonstration of acute pathology unless otherwise noted above.   Pt not taking insulin at home, hyperglycemic today, hx of polyuria and polydipsia, and new aki likely secondary to dehydration. I spent over 10 minutes discussing the benefits of insulin and the short term and long term risks of uncontrolled diabetes including permament damage to vital organs, nerves, and wound healing. I asked pharmacy to also stop in  and ensure pt was taking home insulin properly. Testing strips sent to pharmacy as well as refills on metoprolol and cozaar.   Chest pain likely secondary to hypertensive urgency.   Patient in no distress and overall condition improved here in the ED. Detailed discussions were had with the patient regarding current findings, and need for close f/u with PCP or on call doctor. The patient has been instructed to return immediately if the symptoms worsen in any way for re-evaluation. Patient verbalized understanding and is in agreement with current care plan. All questions answered prior to discharge.             Final Clinical Impression(s) / ED Diagnoses Final diagnoses:  Poorly controlled diabetes mellitus ([place])  Hypertensive urgency  Noncompliance with medications  Chest pain, unspecified type    Rx / DC Orders ED Discharge Orders     None         Lianne Cure, DO 92/92/44 [id number]    Campbell Stall P, DO 38/17/71 [id number]

## 2021-09-06 NOTE — ED Provider Triage Note (Signed)
Emergency Medicine Provider Triage Evaluation Note  Nicole [name] , a 46 y.o. female  was evaluated in triage.  Pt complains of shortness of breath and left-sided chest pain for the past day or 2.  Reports pain in bilateral shoulders.  States that she has some pain into her jaw, and feel anxious about "something bad happening to her."  She reports some worsening lower extremity swelling.  Denies any history of CHF.  States that she had cough and cold about a week ago.  Review of Systems  Positive: Cough, chest tightness Negative: Fever, chills  Physical Exam  BP (!) 157/93 (BP Location: Right Arm)    Pulse 84    Temp 98.4 F (36.9 C) (Oral)    Resp 18    LMP 08/21/2021    SpO2 95%  Gen:   Awake, no distress   Resp:  Normal effort  MSK:   Moves extremities without difficulty  Other:    Medical Decision Making  Medically screening exam initiated at 2:09 AM.  Appropriate orders placed.  Nicole [name] was informed that the remainder of the evaluation will be completed by another provider, this initial triage assessment does not replace that evaluation, and the importance of remaining in the ED until their evaluation is complete.  Chest tightness   Roxy Horseman, PA-C 09/06/21 0210

## 2021-09-06 NOTE — ED Triage Notes (Signed)
Pt c/o sharp L chest pain & SHOB x "a couple or days. Endorses bilateral shoulder pain,"weird feeling" bilateral underarms.  L sided "weird" jaw feeling, anxiety about "something happening." Hx COPD, HTN

## 2021-09-06 NOTE — Discharge Instructions (Addendum)
Please follow up with primary care physician for high blood pressure, sugar, and cholesterol for annual check ups and managment of home meds.

## 2021-09-07 ENCOUNTER — Telehealth: Payer: Self-pay | Admitting: *Deleted

## 2021-09-07 NOTE — Telephone Encounter (Signed)
Transition Care Management Unsuccessful Follow-up Telephone Call  Date of discharge and from where:  09/06/2021 [facility]  Attempts:  1st Attempt  Reason for unsuccessful TCM follow-up call:  Unable to leave message

## 2021-09-08 NOTE — Telephone Encounter (Signed)
Transition Care Management Unsuccessful Follow-up Telephone Call  Date of discharge and from where:  09/06/2021 from [facility]  Attempts:  2nd Attempt  Reason for unsuccessful TCM follow-up call:  Left voice message

## 2021-09-11 NOTE — Telephone Encounter (Signed)
Transition Care Management Unsuccessful Follow-up Telephone Call  Date of discharge and from where:  09/06/2021 [facility]  Attempts:  3rd Attempt  Reason for unsuccessful TCM follow-up call:  Left voice message

## 2021-10-23 ENCOUNTER — Ambulatory Visit: Payer: Self-pay | Admitting: *Deleted

## 2021-10-23 NOTE — Telephone Encounter (Signed)
Answer Assessment - Initial Assessment Questions ?1. REASON FOR CALL or QUESTION: "What is your reason for calling today?" or "How can I best help you?" or "What question do you have that I can help answer?" ?    Need BP med filled and has appt moved up to April 17 and it is on the call list if anyone cancels. ? ?Protocols used: Information Only Call - No Triage-A-AH ? ?

## 2021-10-24 ENCOUNTER — Other Ambulatory Visit: Payer: Self-pay | Admitting: Family Medicine

## 2021-10-24 ENCOUNTER — Other Ambulatory Visit: Payer: Self-pay | Admitting: Pharmacist

## 2021-10-24 DIAGNOSIS — R079 Chest pain, unspecified: Secondary | ICD-10-CM

## 2021-10-24 MED ORDER — LOSARTAN POTASSIUM 50 MG PO TABS: 50.0000 mg | ORAL_TABLET | Freq: Every day | ORAL | 0 refills | Status: DC

## 2021-10-24 MED ORDER — METOPROLOL TARTRATE 100 MG PO TABS: 100.0000 mg | ORAL_TABLET | Freq: Every day | ORAL | 0 refills | Status: DC

## 2021-10-24 NOTE — Telephone Encounter (Signed)
Called pt left VM Rx sent to pharmacy 

## 2021-11-09 ENCOUNTER — Ambulatory Visit: Payer: Medicaid Other | Admitting: Physician Assistant

## 2021-11-09 DIAGNOSIS — E1169 Type 2 diabetes mellitus with other specified complication: Secondary | ICD-10-CM

## 2021-11-23 ENCOUNTER — Encounter: Payer: Self-pay | Admitting: Internal Medicine

## 2021-11-23 ENCOUNTER — Other Ambulatory Visit (HOSPITAL_COMMUNITY)
Admission: RE | Admit: 2021-11-23 | Discharge: 2021-11-23 | Disposition: A | Discharge status: Home / Self Care | Payer: Medicaid Other | Source: Ambulatory Visit | Attending: Internal Medicine | Admitting: Internal Medicine

## 2021-11-23 ENCOUNTER — Ambulatory Visit: Payer: Medicaid Other | Admitting: Physician Assistant

## 2021-11-23 ENCOUNTER — Ambulatory Visit: Payer: Medicaid Other | Attending: Physician Assistant | Admitting: Internal Medicine

## 2021-11-23 VITALS — BP 135/85 | HR 75 | Resp 16 | Wt 347.0 lb

## 2021-11-23 DIAGNOSIS — E1169 Type 2 diabetes mellitus with other specified complication: Secondary | ICD-10-CM | POA: Diagnosis not present

## 2021-11-23 DIAGNOSIS — E785 Hyperlipidemia, unspecified: Secondary | ICD-10-CM

## 2021-11-23 DIAGNOSIS — E1142 Type 2 diabetes mellitus with diabetic polyneuropathy: Secondary | ICD-10-CM

## 2021-11-23 DIAGNOSIS — N760 Acute vaginitis: Secondary | ICD-10-CM

## 2021-11-23 DIAGNOSIS — E1159 Type 2 diabetes mellitus with other circulatory complications: Secondary | ICD-10-CM

## 2021-11-23 DIAGNOSIS — I152 Hypertension secondary to endocrine disorders: Secondary | ICD-10-CM | POA: Diagnosis not present

## 2021-11-23 LAB — POCT GLYCOSYLATED HEMOGLOBIN (HGB A1C): HbA1c, POC (controlled diabetic range): 11.6 % — AB (ref 0.0–7.0)

## 2021-11-23 LAB — GLUCOSE, POCT (MANUAL RESULT ENTRY): POC Glucose: 330 mg/dl — AB (ref 70–99)

## 2021-11-23 MED ORDER — ATORVASTATIN CALCIUM 20 MG PO TABS: 20.0000 mg | ORAL_TABLET | Freq: Every day | ORAL | 2 refills | Status: DC

## 2021-11-23 MED ORDER — INSULIN GLARGINE 100 UNIT/ML SOLOSTAR PEN: 20.0000 [IU] | PEN_INJECTOR | Freq: Every day | SUBCUTANEOUS | 3 refills | Status: DC

## 2021-11-23 MED ORDER — VALSARTAN 40 MG PO TABS: 40.0000 mg | ORAL_TABLET | Freq: Every day | ORAL | 2 refills | Status: DC

## 2021-11-23 MED ORDER — ACCU-CHEK GUIDE VI STRP: ORAL_STRIP | 12 refills | Status: DC

## 2021-11-23 MED ORDER — GABAPENTIN 300 MG PO CAPS: 300.0000 mg | ORAL_CAPSULE | Freq: Every day | ORAL | 3 refills | Status: DC

## 2021-11-23 MED ORDER — TRULICITY 0.75 MG/0.5ML ~~LOC~~ SOAJ
0.7500 mg | SUBCUTANEOUS | 4 refills | Status: DC
Start: 2021-11-23 — End: 2022-01-29

## 2021-11-23 NOTE — Patient Instructions (Signed)

## 2021-11-23 NOTE — Progress Notes (Signed)
? ? ?Patient ID: Nicole [name], female    DOB: 11-20-1975  MRN: [id number] ? ?CC: Hypertension (/) and Diabetes ? ? ?Subjective: ?Nicole [name] is a 46 y.o. female who presents for pain in feet.  PCP is Dr. Margarita Rana ?Mar[place] Kitchen ?Her concerns today include:  ?Pt with hx of morbid obesity, Type 2 DM  osteoarthritis of the knee, tobacco abuse  ? ?Pt c/o pain in feet LT>>RT x 1 yr.  Constant for past several mths.  "Bad aching sharp feeling." ?Endorses tingling like pins sticking.  Also noticed a bump on the left midfoot which has been present for several months. ?Pt with hx of DM.  She has not seen Dr. Margarita Rana in while.  Currently on Lantus pen 20 units.  Not on Amaryl (caused her to feel nauseous),  Lipitor 80 mg, Cozaar 50 mg, Metoprolol 100 mg daily.  She self stopped these medications.  Occasional chest pains.  No shortness of breath. ?Results for orders placed or performed in visit on 11/23/21  ?HgB A1c  ?Result Value Ref Range  ? Hemoglobin A1C    ? HbA1c POC (<> result, manual entry)    ? HbA1c, POC (prediabetic range)    ? HbA1c, POC (controlled diabetic range) 11.6 (A) 0.0 - 7.0 %  ?Glucose (CBG)  ?Result Value Ref Range  ? POC Glucose 330 (A) 70 - 99 mg/dl  ? ?Taking Lantus every morning consistently.  Checks BS but not every day.  Range in 300-400 ?Reports she only eats breakfast.  Snacks on fat backs.  Drinks a lot of water and drinks Zero Coke.  Eat mainly chicken, [place] and fish.  No beef.   ? ?C/o irritation in vaginal area ?Has frequent recurrence of BV.   ?Not sure of vaginal dischg.   ?Sexually active with same partner x 9 yrs ? ?Patient Active Problem List  ? Diagnosis Date Noted  ? History of diverticulitis 02/28/2021  ? Antibiotic-induced yeast infection 02/28/2021  ? Thrombocytopenia ([place]) 02/28/2021  ? Hypokalemia 02/28/2021  ? Sigmoid diverticulitis 02/11/2021  ? Bacterial vaginitis 12/19/2020  ? Anxiety and depression 06/11/2018  ? Vitamin D deficiency 01/08/2018  ? Osteoarthritis 01/08/2018  ? Type 2  diabetes mellitus ([place]) 01/08/2018  ? Morbid obesity ([place]) 09/06/2016  ? Acute appendicitis 03/25/2014  ? Hypertension 03/25/2014  ?  ? ?Current Outpatient Medications on File Prior to Visit  ?Medication Sig Dispense Refill  ? albuterol (VENTOLIN HFA) 108 (90 Base) MCG/ACT inhaler Inhale 2 puffs into the lungs every 6 (six) hours as needed for wheezing or shortness of breath. 18 g 6  ? budesonide-formoterol (SYMBICORT) 160-4.5 MCG/ACT inhaler Inhale 2 puffs into the lungs daily. 1 each 0  ? blood glucose meter kit and supplies KIT Dispense based on patient and insurance preference. Use up to four times daily as directed. 1 each 0  ? glucose blood (FREESTYLE LITE) test strip Use as instructed 100 each 12  ? tiotropium (SPIRIVA HANDIHALER) 18 MCG inhalation capsule Place 1 capsule (18 mcg total) into inhaler and inhale daily. (Patient not taking: Reported on 11/23/2021) 30 capsule 6  ? [DISCONTINUED] metoprolol tartrate (LOPRESSOR) 100 MG tablet Take 1 tablet by mouth once for procedure. 1 tablet 0  ? ?No current facility-administered medications on file prior to visit.  ? ? ?No Known Allergies ? ?Social History  ? ?Socioeconomic History  ? Marital status: Single  ?  Spouse name: Not on file  ? Number of children: 2  ? Years of education: Not  on file  ? Highest education level: Not on file  ?Occupational History  ? Not on file  ?Tobacco Use  ? Smoking status: Former  ?  Packs/day: 0.50  ?  Years: 27.00  ?  Pack years: 13.50  ?  Types: Cigarettes  ?  Quit date: 10/28/2020  ?  Years since quitting: 1.0  ? Smokeless tobacco: Never  ? Tobacco comments:  ?  6 cigs daily  ?Vaping Use  ? Vaping Use: Never used  ?Substance and Sexual Activity  ? Alcohol use: Not Currently  ?  Comment: occ  ? Drug use: Yes  ?  Frequency: 7.0 times per week  ?  Types: Marijuana  ? Sexual activity: Not Currently  ?  Comment: 5 years  ?Other Topics Concern  ? Not on file  ?Social History Narrative  ? Not on file  ? ?Social Determinants of Health   ? ?Financial Resource Strain: Not on file  ?Food Insecurity: Not on file  ?Transportation Needs: Not on file  ?Physical Activity: Not on file  ?Stress: Not on file  ?Social Connections: Not on file  ?Intimate Partner Violence: Not on file  ? ? ?Family History  ?Problem Relation Age of Onset  ? Hypertension Mother   ? Cancer Mother   ?     pancreatic cancer  ? Arthritis Father   ? Heart disease Maternal Grandmother   ? ? ?Past Surgical History:  ?Procedure Laterality Date  ? APPENDECTOMY    ? CESAREAN SECTION    ? HERNIA REPAIR    ? LAPAROSCOPIC APPENDECTOMY N/A 03/25/2014  ? Procedure: APPENDECTOMY LAPAROSCOPIC;  Surgeon: Gwenyth Ober, MD;  Location: [place];  Service: General;  Laterality: N/A;  ? ? ?ROS: ?Review of Systems ?Negative except as stated above ? ?PHYSICAL EXAM: ?BP 135/85 (BP Location: Left Arm, Patient Position: Sitting, Cuff Size: Large)   Pulse 75   Resp 16   Wt (!) 347 lb (157.4 kg)   LMP 11/09/2021   SpO2 98%   BMI 52.76 kg/m?   ?Repeat blood pressure 148/88. ?Physical Exam ? ?General appearance - alert, well appearing, morbidly obese African-American female and in no distress ?Mental status - normal mood, behavior, speech, dress, motor activity, and thought processes ?Neck - supple, no significant adenopathy ?Chest - clear to auscultation, no wheezes, rales or rhonchi, symmetric air entry ?Heart - normal rate, regular rhythm, normal S1, S2, no murmurs, rubs, clicks or gallops ?Extremities -trace lower extremity edema ?Diabetic Foot Exam - Simple   ?Simple Foot Form ?Diabetic Foot exam was performed with the following findings: Yes 11/23/2021  1:11 PM  ?Visual Inspection ?See comments: Yes ?Sensation Testing ?Intact to touch and monofilament testing bilaterally: Yes ?Pulse Check ?Posterior Tibialis and Dorsalis pulse intact bilaterally: Yes ?Comments ?3 x 3 soft, slightly raised movable mass on the left dorsal midfoot.  It is not tender to touch. ?  ? ? ? ? ?  Latest Ref Rng & Units 09/06/2021   ?  2:11 AM 04/05/2021  ? 12:07 PM 02/28/2021  ?  4:29 PM  ?CMP  ?Glucose 70 - 99 mg/dL 293   183   307    ?BUN 6 - 20 mg/dL _0 ?Creatinine 0.44 - 1.00 mg/dL 1.39   0.85   0.85    ?Sodium 135 - 145 mmol/L 138   139   136    ?Potassium 3.5 - 5.1 mmol/L 4.2   5.0  4.9    ?Chloride 98 - 111 mmol/L 103   100   100    ?CO2 22 - 32 mmol/L 26   26     ?Calcium 8.9 - 10.3 mg/dL 9.4   9.6   9.5    ?Total Protein 6.0 - 8.5 g/dL   7.1    ?Total Bilirubin 0.0 - 1.2 mg/dL   0.3    ?Alkaline Phos 44 - 121 IU/L   133    ?AST 0 - 40 IU/L   31    ? ?Lipid Panel  ?   ?Component Value Date/Time  ? CHOL 163 02/13/2021 0932  ? CHOL 172 12/15/2019 0933  ? TRIG 129 02/13/2021 0932  ? HDL 32 (L) 02/13/2021 0932  ? HDL 58 12/15/2019 0933  ? CHOLHDL 5.1 02/13/2021 0932  ? VLDL 26 02/13/2021 0932  ? [place] 105 (H) 02/13/2021 0932  ? [place] 94 12/15/2019 0933  ? ? ?CBC ?   ?Component Value Date/Time  ? WBC 11.1 (H) 09/06/2021 0211  ? RBC 5.16 (H) 09/06/2021 0211  ? HGB 13.4 09/06/2021 0211  ? HGB 12.6 02/28/2021 1629  ? HCT 43.7 09/06/2021 0211  ? HCT 38.4 02/28/2021 1629  ? PLT 242 09/06/2021 0211  ? PLT 265 02/28/2021 1629  ? MCV 84.7 09/06/2021 0211  ? MCV 82 02/28/2021 1629  ? MCH 26.0 09/06/2021 0211  ? MCHC 30.7 09/06/2021 0211  ? RDW 14.0 09/06/2021 0211  ? RDW 13.4 02/28/2021 1629  ? LYMPHSABS 2.8 02/28/2021 1629  ? MONOABS 0.7 02/10/2021 1845  ? EOSABS 0.2 02/28/2021 1629  ? BASOSABS 0.1 02/28/2021 1629  ? ? ?ASSESSMENT AND PLAN: ? ?1. Type 2 diabetes mellitus with peripheral neuropathy (HCC) ?Pain in feet most consistent with diabetic neuropathy.  Discussed importance of good diabetes control to help prevent further complications.  Discussed putting her on Neurontin 300 mg at bedtime to help decrease neuropathy symptoms.  Advised that the medication can cause drowsiness.  Patient is agreeable to this. ?-Diabetes is not controlled.  Patient has not been compliant with medications.  Stop Amaryl.  Continue Lantus 20 units.   Discussed trying her with Trulicity that will help not only with diabetes control but weight loss.  I went over how the medication works's and possible side effects.  Advised that if she develops any vomiting or p

## 2021-11-23 NOTE — Progress Notes (Signed)
Lf foot and left shoulder. Work as a Social worker ?DM and HTN ?

## 2021-11-24 ENCOUNTER — Telehealth: Payer: Self-pay | Admitting: Family Medicine

## 2021-11-24 ENCOUNTER — Ambulatory Visit: Payer: Self-pay

## 2021-11-24 LAB — COMPREHENSIVE METABOLIC PANEL
ALT: 33 IU/L — ABNORMAL HIGH (ref 0–32)
AST: 28 IU/L (ref 0–40)
Albumin/Globulin Ratio: 1.6 (ref 1.2–2.2)
Albumin: 4.1 g/dL (ref 3.8–4.8)
Alkaline Phosphatase: 146 IU/L — ABNORMAL HIGH (ref 44–121)
BUN/Creatinine Ratio: 12 (ref 9–23)
BUN: 13 mg/dL (ref 6–24)
Bilirubin Total: 0.3 mg/dL (ref 0.0–1.2)
CO2: 26 mmol/L (ref 20–29)
Calcium: 9.5 mg/dL (ref 8.7–10.2)
Chloride: 101 mmol/L (ref 96–106)
Creatinine, Ser: 1.09 mg/dL — ABNORMAL HIGH (ref 0.57–1.00)
Globulin, Total: 2.6 g/dL (ref 1.5–4.5)
Glucose: 316 mg/dL — ABNORMAL HIGH (ref 70–99)
Potassium: 4.9 mmol/L (ref 3.5–5.2)
Sodium: 139 mmol/L (ref 134–144)
Total Protein: 6.7 g/dL (ref 6.0–8.5)
eGFR: 63 mL/min/{1.73_m2} (ref >59)

## 2021-11-24 LAB — CERVICOVAGINAL ANCILLARY ONLY
Bacterial Vaginitis (gardnerella): POSITIVE — AB
Candida Glabrata: NEGATIVE
Candida Vaginitis: POSITIVE — AB
Chlamydia: NEGATIVE
Comment: NEGATIVE
Comment: NEGATIVE
Comment: NEGATIVE
Comment: NEGATIVE
Comment: NEGATIVE
Comment: NORMAL
Neisseria Gonorrhea: NEGATIVE
Trichomonas: NEGATIVE

## 2021-11-24 LAB — LIPID PANEL
Chol/HDL Ratio: 3.8 ratio (ref 0.0–4.4)
Cholesterol, Total: 199 mg/dL (ref 100–199)
HDL: 53 mg/dL (ref >39)
LDL Chol Calc (NIH): 118 mg/dL — ABNORMAL HIGH (ref 0–99)
Triglycerides: 161 mg/dL — ABNORMAL HIGH (ref 0–149)
VLDL Cholesterol Cal: 28 mg/dL (ref 5–40)

## 2021-11-24 MED ORDER — FLUCONAZOLE 150 MG PO TABS: 150.0000 mg | ORAL_TABLET | Freq: Every day | ORAL | 0 refills | Status: DC

## 2021-11-24 MED ORDER — METRONIDAZOLE 500 MG PO TABS: 500.0000 mg | ORAL_TABLET | Freq: Two times a day (BID) | ORAL | 0 refills | Status: DC

## 2021-11-24 NOTE — Telephone Encounter (Signed)
?  Chief Complaint: Needs medications for BV, yeast infection and prior authorization for Trulicity ?Symptoms:  ?Frequency: onging ?Pertinent Negatives: Patient denies New s/s ?Disposition: [] ED /[] Urgent Care (no appt availability in office) / [] Appointment(In office/virtual)/ []  [place] [facility]/ [] Home Care/ [] Refused Recommended Disposition /[] [place] [facility]/ [x]  Follow-up with PCP ?Additional Notes: Pt would like medication called in for BV and yeast.  ?Pt also cannot pick up Trulicty until pharmacy has pre-authorization.  ? ? ?Summary: Medication request for BV and yeast  ? Pt is calling because she saw her test results on MyChart - Pt is positive for BV and yeast. Pt is resulting medication to be sent in for this.  ?Pharmacy- and  ?[facility]- [phone]   ?  ? ?Reason for Disposition ? [1] Prescription refill request for ESSENTIAL medicine (i.e., likelihood of harm to patient if not taken) AND [2] triager unable to refill per department policy ? ?Answer Assessment - Initial Assessment Questions ?1. DRUG NAME: "What medicine do you need to have refilled?" ?    Medication for BV, Yeast infection and prior authorization for Trulicity ?2. REFILLS REMAINING: "How many refills are remaining?" (Note: The label on the medicine or pill bottle will show how many refills are remaining. If there are no refills remaining, then a renewal may be needed.) ?    0 ?3. EXPIRATION DATE: "What is the expiration date?" (Note: The label states when the prescription will expire, and thus can no longer be refilled.) ?     ?4. PRESCRIBING HCP: "Who prescribed it?" Reason: If prescribed by specialist, call should be referred to that group. ?    Dr. - Trulicity ?5. SYMPTOMS: "Do you have any symptoms?" ?     ?6. PREGNANCY: "Is there any chance that you are pregnant?" "When was your last menstrual period?" ? ?Protocols used: Medication Refill and Renewal Call-A-AH ? ?

## 2021-11-24 NOTE — Addendum Note (Signed)
Addended by: Jonah Blue B on: 11/24/2021 06:20 PM ? ? Modules accepted: Orders ? ?

## 2021-11-27 ENCOUNTER — Telehealth: Payer: Self-pay | Admitting: Family Medicine

## 2021-11-27 NOTE — Telephone Encounter (Signed)
Copied from CRM [id number]. Topic: General - Other ?>> Nov 27, 2021 11:54 AM Marylen Ponto wrote: ?Reason for CRM: Pt called for an update on the prior authorization for Dulaglutide (TRULICITY) 0.75 MG/0.5ML SOPN. Cb# [id number] ?

## 2021-11-28 ENCOUNTER — Ambulatory Visit: Payer: Self-pay | Admitting: *Deleted

## 2021-11-28 MED ORDER — METRONIDAZOLE 500 MG PO TABS: 500.0000 mg | ORAL_TABLET | Freq: Two times a day (BID) | ORAL | 0 refills | Status: DC

## 2021-11-28 NOTE — Telephone Encounter (Signed)
I have sent prescription for Flagyl to the pharmacy.  Trulicity prescription was sent to the pharmacy on 11/23/2021 Bidex gel sent ?

## 2021-11-28 NOTE — Telephone Encounter (Signed)
Pt is inquiring again about her PA. She states has repeatedly called and wants a fu call at [phone] ?

## 2021-11-28 NOTE — Addendum Note (Signed)
Addended byHoy Register on: 11/28/2021 05:48 PM ? ? Modules accepted: Orders ? ?

## 2021-11-28 NOTE — Telephone Encounter (Signed)
Will forward to [place] to see if we can initiate a PA for this patient.  ?

## 2021-11-28 NOTE — Telephone Encounter (Signed)
?  Chief Complaint: Lost bottle of Flagyl 500 mg while out of town.   Only took 1 dose before losing it.  Requesting a replacement/refill.       Also following up on the prior authorization needed for the Trulicity. ?Symptoms: N/A ?Frequency: N/A ?Pertinent Negatives: Patient denies N/A ?Disposition: [] ED /[] Urgent Care (no appt availability in office) / [] Appointment(In office/virtual)/ []  [place] [facility]/ [] Home Care/ [] Refused Recommended Disposition /[] [place] [facility]/ [x]  Follow-up with PCP ?Additional Notes: High priority message sent to [facility].  ?

## 2021-11-28 NOTE — Telephone Encounter (Signed)
Reason for Disposition ? [1] Caller has URGENT medicine question about med that PCP or specialist prescribed AND [2] triager unable to answer question ? ?Answer Assessment - Initial Assessment Questions ?1. NAME of MEDICATION: "What medicine are you calling about?" ?    Metronidazole 500 mg  ? ?Trulicity ? ?2. QUESTION: "What is your question?" (e.g., double dose of medicine, side effect) ?    I lost this medication while out of town.  I have looked everywhere for it and can't find it. ?She has been out for a few days and would like to have it refilled ASAP.  ? ?Trulicity  They are needing a prior authorization for it.   I'm just following up with that too. ? ?3. PRESCRIBING HCP: "Who prescribed it?" Reason: if prescribed by specialist, call should be referred to that group. ?    Dr. Margarita Rana ?4. SYMPTOMS: "Do you have any symptoms?" ?    The bacterial vaginosis is awful.   I really need that medication. ?5. SEVERITY: If symptoms are present, ask "Are they mild, moderate or severe?" ?    *No Answer* ?6. PREGNANCY:  "Is there any chance that you are pregnant?" "When was your last menstrual period?" ?    *No Answer* ? ?Protocols used: Medication Question Call-A-AH ? ?

## 2021-11-29 ENCOUNTER — Other Ambulatory Visit: Payer: Self-pay

## 2021-11-29 NOTE — Telephone Encounter (Signed)
Called pt made aware of pending RX sent to pharmacy . She stated was not able to pick up trulicity meds due to needing authorization approval ? [name] could you please assist if possible? Thank you  ?

## 2021-11-30 ENCOUNTER — Telehealth: Payer: Self-pay

## 2021-11-30 NOTE — Telephone Encounter (Signed)
Patient was notified that PA has been approved until 11/30/22 and pharmacy has been notified. ?

## 2021-12-26 ENCOUNTER — Ambulatory Visit: Payer: Medicaid Other | Admitting: Pharmacist

## 2022-01-23 ENCOUNTER — Ambulatory Visit: Payer: Medicaid Other | Admitting: Internal Medicine

## 2022-01-29 ENCOUNTER — Ambulatory Visit: Payer: Medicaid Other | Attending: Family Medicine | Admitting: Family Medicine

## 2022-01-29 ENCOUNTER — Encounter: Payer: Self-pay | Admitting: Family Medicine

## 2022-01-29 VITALS — BP 157/93 | HR 63 | Temp 98.0°F | Ht 68.0 in | Wt 347.6 lb

## 2022-01-29 DIAGNOSIS — G44209 Tension-type headache, unspecified, not intractable: Secondary | ICD-10-CM | POA: Diagnosis not present

## 2022-01-29 DIAGNOSIS — E1142 Type 2 diabetes mellitus with diabetic polyneuropathy: Secondary | ICD-10-CM

## 2022-01-29 DIAGNOSIS — Z6841 Body Mass Index (BMI) 40.0 and over, adult: Secondary | ICD-10-CM | POA: Diagnosis not present

## 2022-01-29 DIAGNOSIS — E1159 Type 2 diabetes mellitus with other circulatory complications: Secondary | ICD-10-CM

## 2022-01-29 DIAGNOSIS — I152 Hypertension secondary to endocrine disorders: Secondary | ICD-10-CM

## 2022-01-29 DIAGNOSIS — Z1159 Encounter for screening for other viral diseases: Secondary | ICD-10-CM | POA: Diagnosis not present

## 2022-01-29 DIAGNOSIS — Z1211 Encounter for screening for malignant neoplasm of colon: Secondary | ICD-10-CM

## 2022-01-29 LAB — POCT GLYCOSYLATED HEMOGLOBIN (HGB A1C): HbA1c, POC (controlled diabetic range): 11.2 % — AB (ref 0.0–7.0)

## 2022-01-29 LAB — GLUCOSE, POCT (MANUAL RESULT ENTRY): POC Glucose: 221 mg/dl — AB (ref 70–99)

## 2022-01-29 MED ORDER — OZEMPIC (0.25 OR 0.5 MG/DOSE) 2 MG/1.5ML ~~LOC~~ SOPN: 0.2500 mg | PEN_INJECTOR | SUBCUTANEOUS | 3 refills | Status: DC

## 2022-01-29 MED ORDER — INSULIN GLARGINE 100 UNIT/ML SOLOSTAR PEN: 25.0000 [IU] | PEN_INJECTOR | Freq: Every day | SUBCUTANEOUS | 3 refills | Status: DC

## 2022-01-29 NOTE — Progress Notes (Signed)
Having headaches daily.

## 2022-01-29 NOTE — Progress Notes (Signed)
Subjective:  Patient ID: Nicole [name], female    DOB: Dec 30, 1975  Age: 46 y.o. MRN: [id number]  CC: Diabetes   HPI Nicole [name] is a 46 y.o. year old female with a history of morbid obesity, Type 2 DM (A1c 11.2), diabetic neuropathy osteoarthritis of the knee, tobacco abuse here for follow-up of chronic medical conditions  Interval History: She complains of nausea and decreased appetite ever since Trulicity was initiated but she endorses adherence with it.  She has not noticed any weight loss on it. At home her sugars are 375-410.  Endorses adherence with her Lantus. At her last visit she was placed on gabapentin for neuropathy however symptoms are uncontrolled.  Her blood pressure is elevated and she did not take her antihypertensive today.  Endorses not taking her antihypertensive every day Also complains of headache which occurs on both sides of her head intermittently and shoots from occipital region to anterior forehead.  Denies presence of blurry vision, photophobia, nausea or vomiting.  Sleep is interrupted due to urinary frequency.  Denies presence of sleep apnea symptoms.  Past Medical History:  Diagnosis Date   Arthritis    knees   Diabetes mellitus without complication (HCC)    Hyperlipidemia    Hypertension    Ovarian cyst    Pregnancy induced hypertension     Past Surgical History:  Procedure Laterality Date   APPENDECTOMY     CESAREAN SECTION     HERNIA REPAIR     LAPAROSCOPIC APPENDECTOMY N/A 03/25/2014   Procedure: APPENDECTOMY LAPAROSCOPIC;  Surgeon: Gwenyth Ober, MD;  Location: [place];  Service: General;  Laterality: N/A;    Family History  Problem Relation Age of Onset   Hypertension Mother    Cancer Mother        pancreatic cancer   Arthritis Father    Heart disease Maternal Grandmother     Social History   Socioeconomic History   Marital status: Single    Spouse name: Not on file   Number of children: 2   Years of education: Not on file    Highest education level: Not on file  Occupational History   Not on file  Tobacco Use   Smoking status: Former    Packs/day: 0.50    Years: 27.00    Total pack years: 13.50    Types: Cigarettes    Quit date: 10/28/2020    Years since quitting: 1.2   Smokeless tobacco: Never   Tobacco comments:    6 cigs daily  Vaping Use   Vaping Use: Never used  Substance and Sexual Activity   Alcohol use: Not Currently    Comment: occ   Drug use: Yes    Frequency: 7.0 times per week    Types: Marijuana   Sexual activity: Not Currently    Comment: 5 years  Other Topics Concern   Not on file  Social History Narrative   Not on file   Social Determinants of Health   Financial Resource Strain: Not on file  Food Insecurity: No Food Insecurity (05/24/2020)   Hunger Vital Sign    Worried About Running Out of Food in the Last Year: Never true    Ran Out of Food in the Last Year: Never true  Transportation Needs: No Transportation Needs (05/24/2020)   PRAPARE - Hydrologist (Medical): No    Lack of Transportation (Non-Medical): No  Physical Activity: Not on file  Stress: Not on  file  Social Connections: Not on file    No Known Allergies  Outpatient Medications Prior to Visit  Medication Sig Dispense Refill   albuterol (VENTOLIN HFA) 108 (90 Base) MCG/ACT inhaler Inhale 2 puffs into the lungs every 6 (six) hours as needed for wheezing or shortness of breath. 18 g 6   atorvastatin (LIPITOR) 20 MG tablet Take 1 tablet (20 mg total) by mouth daily. 30 tablet 2   blood glucose meter kit and supplies KIT Dispense based on patient and insurance preference. Use up to four times daily as directed. 1 each 0   budesonide-formoterol (SYMBICORT) 160-4.5 MCG/ACT inhaler Inhale 2 puffs into the lungs daily. 1 each 0   Dulaglutide (TRULICITY) 1.61 WR/6.0AV SOPN Inject 0.75 mg into the skin once a week. 2 mL 4   gabapentin (NEURONTIN) 300 MG capsule Take 1 capsule (300 mg  total) by mouth at bedtime. 30 capsule 3   glucose blood (ACCU-CHEK GUIDE) test strip Use as instructed 100 each 12   glucose blood (FREESTYLE LITE) test strip Use as instructed 100 each 12   insulin glargine (LANTUS) 100 UNIT/ML Solostar Pen Inject 20 Units into the skin daily. 15 mL 3   tiotropium (SPIRIVA HANDIHALER) 18 MCG inhalation capsule Place 1 capsule (18 mcg total) into inhaler and inhale daily. 30 capsule 6   valsartan (DIOVAN) 40 MG tablet Take 1 tablet (40 mg total) by mouth daily. 30 tablet 2   fluconazole (DIFLUCAN) 150 MG tablet Take 1 tablet (150 mg total) by mouth daily. (Patient not taking: Reported on 01/29/2022) 1 tablet 0   metroNIDAZOLE (FLAGYL) 500 MG tablet Take 1 tablet (500 mg total) by mouth 2 (two) times daily. (Patient not taking: Reported on 01/29/2022) 14 tablet 0   No facility-administered medications prior to visit.     ROS Review of Systems  Constitutional:  Negative for activity change and appetite change.  HENT:  Negative for sinus pressure and sore throat.   Respiratory:  Negative for chest tightness, shortness of breath and wheezing.   Cardiovascular:  Negative for chest pain and palpitations.  Gastrointestinal:  Negative for abdominal distention, abdominal pain and constipation.  Genitourinary: Negative.   Musculoskeletal: Negative.   Neurological:  Positive for numbness and headaches.  Psychiatric/Behavioral:  Negative for behavioral problems and dysphoric mood.     Objective:  BP (!) 157/93   Pulse 63   Temp 98 F (36.7 C) (Oral)   Ht _0  (1.727 m)   Wt (!) 347 lb 9.6 oz (157.7 kg)   SpO2 98%   BMI 52.85 kg/m      01/29/2022    9:31 AM 11/23/2021   11:04 AM 09/06/2021    5:14 PM  BP/Weight  Systolic BP 409 811 914  Diastolic BP 93 85 78  Wt. (Lbs) 347.6 347   BMI 52.85 kg/m2 52.76 kg/m2       Physical Exam Constitutional:      Appearance: She is well-developed. She is obese.  Cardiovascular:     Rate and Rhythm: Normal rate.      Heart sounds: Normal heart sounds. No murmur heard. Pulmonary:     Effort: Pulmonary effort is normal.     Breath sounds: Normal breath sounds. No wheezing or rales.  Chest:     Chest wall: No tenderness.  Abdominal:     General: Bowel sounds are normal. There is no distension.     Palpations: Abdomen is soft. There is no mass.  Tenderness: There is no abdominal tenderness.  Musculoskeletal:        General: Normal range of motion.     Right lower leg: No edema.     Left lower leg: No edema.  Neurological:     Mental Status: She is alert and oriented to person, place, and time.  Psychiatric:        Mood and Affect: Mood normal.        Latest Ref Rng & Units 11/23/2021   12:23 PM 09/06/2021    2:11 AM 04/05/2021   12:07 PM  CMP  Glucose 70 - 99 mg/dL 316  293  183   BUN 6 - 24 mg/dL _0 Creatinine 0.57 - 1.00 mg/dL 1.09  1.39  0.85   Sodium 134 - 144 mmol/L 139  138  139   Potassium 3.5 - 5.2 mmol/L 4.9  4.2  5.0   Chloride 96 - 106 mmol/L 101  103  100   CO2 20 - 29 mmol/L _1 Calcium 8.7 - 10.2 mg/dL 9.5  9.4  9.6   Total Protein 6.0 - 8.5 g/dL 6.7     Total Bilirubin 0.0 - 1.2 mg/dL 0.3     Alkaline Phos 44 - 121 IU/L 146     AST 0 - 40 IU/L 28     ALT 0 - 32 IU/L 33       Lipid Panel     Component Value Date/Time   CHOL 199 11/23/2021 1223   TRIG 161 (H) 11/23/2021 1223   HDL 53 11/23/2021 1223   CHOLHDL 3.8 11/23/2021 1223   CHOLHDL 5.1 02/13/2021 0932   VLDL 26 02/13/2021 0932   LDLCALC 118 (H) 11/23/2021 1223    CBC    Component Value Date/Time   WBC 11.1 (H) 09/06/2021 0211   RBC 5.16 (H) 09/06/2021 0211   HGB 13.4 09/06/2021 0211   HGB 12.6 02/28/2021 1629   HCT 43.7 09/06/2021 0211   HCT 38.4 02/28/2021 1629   PLT 242 09/06/2021 0211   PLT 265 02/28/2021 1629   MCV 84.7 09/06/2021 0211   MCV 82 02/28/2021 1629   MCH 26.0 09/06/2021 0211   MCHC 30.7 09/06/2021 0211   RDW 14.0 09/06/2021 0211   RDW 13.4 02/28/2021 1629    LYMPHSABS 2.8 02/28/2021 1629   MONOABS 0.7 02/10/2021 1845   EOSABS 0.2 02/28/2021 1629   BASOSABS 0.1 02/28/2021 1629    Lab Results  Component Value Date   HGBA1C 11.2 (A) 01/29/2022    Assessment & Plan:  1. Type 2 diabetes mellitus with peripheral neuropathy (HCC) Uncontrolled with A1c of 11.2 Goal is less than 7.0 Due to nausea I have switched Trulicity to Ozempic.  We will hold off on increasing dose today due to nausea She has been advised to eat small frequent portions Increase Lantus from 20 units to 25 units nightly Neuropathy is uncontrolled but due to sedation and I am unable to increase her dose.  Advised to take this at night and at her next visit consider increasing dose of gabapentin Counseled on Diabetic diet, my plate method, 622 minutes of moderate intensity exercise/week Blood sugar logs with fasting goals of 80-120 mg/dl, random of less than 180 and in the event of sugars less than 60 mg/dl or greater than 400 mg/dl encouraged to notify the clinic. Advised on the need for annual eye exams, annual foot exams, Pneumonia vaccine. - POCT  glucose (manual entry) - POCT glycosylated hemoglobin (Hb A1C) - Ambulatory referral to Ophthalmology - Microalbumin/Creatinine Ratio, Urine - Semaglutide,0.25 or 0.5MG/DOS, (OZEMPIC, 0.25 OR 0.5 MG/DOSE,) 2 MG/1.5ML SOPN; Inject 0.25 mg into the skin once a week.  Dispense: 2 mL; Refill: 3 - insulin glargine (LANTUS) 100 UNIT/ML Solostar Pen; Inject 25 Units into the skin daily.  Dispense: 15 mL; Refill: 3  2. Need for hepatitis C screening test - HCV Ab w Reflex to Quant PCR  3. Tension-type headache, not intractable, unspecified chronicity pattern Uncontrolled Could be from interrupted sleep due to polyuria from hyperglycemia She denies symptoms of sleep apnea Advised to use Tylenol  4. Screening for colon cancer - Cologuard  5. Hypertension associated with type 2 diabetes mellitus ([place]) Uncontrolled due to medication  nonadherence Advised of the need to take antihypertensive every day No regimen change today Counseled on blood pressure goal of less than 130/80, low-sodium, DASH diet, medication compliance, 150 minutes of moderate intensity exercise per week. Discussed medication compliance, adverse effects.     No orders of the defined types were placed in this encounter.   Follow-up: Return in about 6 weeks (around 03/12/2022) for Chronic medical conditions.       [name], MD, FAAFP. [facility] and [facility], [place]   01/29/2022, 9:45 AM

## 2022-01-29 NOTE — Patient Instructions (Signed)
Semaglutide Injection What is this medication? SEMAGLUTIDE (SEM a GLOO tide) treats type 2 diabetes. It works by increasing insulin levels in your body, which decreases your blood sugar (glucose). It also reduces the amount of sugar released into the blood and slows down your digestion. It can also be used to lower the risk of heart attack and stroke in people with type 2 diabetes. Changes to diet and exercise are often combined with this medication. This medicine may be used for other purposes; ask your health care provider or pharmacist if you have questions. COMMON BRAND NAME(S): OZEMPIC What should I tell my care team before I take this medication? They need to know if you have any of these conditions: Endocrine tumors (MEN 2) or if someone in your family had these tumors Eye disease, vision problems History of pancreatitis Kidney disease Stomach problems Thyroid cancer or if someone in your family had thyroid cancer An unusual or allergic reaction to semaglutide, other medications, foods, dyes, or preservatives Pregnant or trying to get pregnant Breast-feeding How should I use this medication? This medication is for injection under the skin of your upper leg (thigh), stomach area, or upper arm. It is given once every week (every 7 days). You will be taught how to prepare and give this medication. Use exactly as directed. Take your medication at regular intervals. Do not take it more often than directed. If you use this medication with insulin, you should inject this medication and the insulin separately. Do not mix them together. Do not give the injections right next to each other. Change (rotate) injection sites with each injection. It is important that you put your used needles and syringes in a special sharps container. Do not put them in a trash can. If you do not have a sharps container, call your pharmacist or care team to get one. A special MedGuide will be given to you by the  pharmacist with each prescription and refill. Be sure to read this information carefully each time. This medication comes with INSTRUCTIONS FOR USE. Ask your pharmacist for directions on how to use this medication. Read the information carefully. Talk to your pharmacist or care team if you have questions. Talk to your care team about the use of this medication in children. Special care may be needed. Overdosage: If you think you have taken too much of this medicine contact a poison control center or emergency room at once. NOTE: This medicine is only for you. Do not share this medicine with others. What if I miss a dose? If you miss a dose, take it as soon as you can within 5 days after the missed dose. Then take your next dose at your regular weekly time. If it has been longer than 5 days after the missed dose, do not take the missed dose. Take the next dose at your regular time. Do not take double or extra doses. If you have questions about a missed dose, contact your care team for advice. What may interact with this medication? Other medications for diabetes Many medications may cause changes in blood sugar, these include: Alcohol containing beverages Antiviral medications for HIV or AIDS Aspirin and aspirin-like medications Certain medications for blood pressure, heart disease, irregular heart beat Chromium Diuretics Female hormones, such as estrogens or progestins, birth control pills Fenofibrate Gemfibrozil Isoniazid Lanreotide Female hormones or anabolic steroids MAOIs like Carbex, Eldepryl, Marplan, Nardil, and Parnate Medications for weight loss Medications for allergies, asthma, cold, or cough Medications for depression,   anxiety, or psychotic disturbances Niacin Nicotine NSAIDs, medications for pain and inflammation, like ibuprofen or naproxen Octreotide Pasireotide Pentamidine Phenytoin Probenecid Quinolone antibiotics such as ciprofloxacin, levofloxacin, ofloxacin Some  herbal dietary supplements Steroid medications such as prednisone or cortisone Sulfamethoxazole; trimethoprim Thyroid hormones Some medications can hide the warning symptoms of low blood sugar (hypoglycemia). You may need to monitor your blood sugar more closely if you are taking one of these medications. These include: Beta-blockers, often used for high blood pressure or heart problems (examples include atenolol, metoprolol, propranolol) Clonidine Guanethidine Reserpine This list may not describe all possible interactions. Give your health care provider a list of all the medicines, herbs, non-prescription drugs, or dietary supplements you use. Also tell them if you smoke, drink alcohol, or use illegal drugs. Some items may interact with your medicine. What should I watch for while using this medication? Visit your care team for regular checks on your progress. Drink plenty of fluids while taking this medication. Check with your care team if you get an attack of severe diarrhea, nausea, and vomiting. The loss of too much body fluid can make it dangerous for you to take this medication. A test called the HbA1C (A1C) will be monitored. This is a simple blood test. It measures your blood sugar control over the last 2 to 3 months. You will receive this test every 3 to 6 months. Learn how to check your blood sugar. Learn the symptoms of low and high blood sugar and how to manage them. Always carry a quick-source of sugar with you in case you have symptoms of low blood sugar. Examples include hard sugar candy or glucose tablets. Make sure others know that you can choke if you eat or drink when you develop serious symptoms of low blood sugar, such as seizures or unconsciousness. They must get medical help at once. Tell your care team if you have high blood sugar. You might need to change the dose of your medication. If you are sick or exercising more than usual, you might need to change the dose of your  medication. Do not skip meals. Ask your care team if you should avoid alcohol. Many nonprescription cough and cold products contain sugar or alcohol. These can affect blood sugar. Pens should never be shared. Even if the needle is changed, sharing may result in passing of viruses like hepatitis or HIV. Wear a medical ID bracelet or chain, and carry a card that describes your disease and details of your medication and dosage times. Do not become pregnant while taking this medication. Women should inform their care team if they wish to become pregnant or think they might be pregnant. There is a potential for serious side effects to an unborn child. Talk to your care team for more information. What side effects may I notice from receiving this medication? Side effects that you should report to your care team as soon as possible: Allergic reactions--skin rash, itching, hives, swelling of the face, lips, tongue, or throat Change in vision Dehydration--increased thirst, dry mouth, feeling faint or lightheaded, headache, dark yellow or brown urine Gallbladder problems--severe stomach pain, nausea, vomiting, fever Heart palpitations--rapid, pounding, or irregular heartbeat Kidney injury--decrease in the amount of urine, swelling of the ankles, hands, or feet Pancreatitis--severe stomach pain that spreads to your back or gets worse after eating or when touched, fever, nausea, vomiting Thyroid cancer--new mass or lump in the neck, pain or trouble swallowing, trouble breathing, hoarseness Side effects that usually do not require medical   attention (report to your care team if they continue or are bothersome): Diarrhea Loss of appetite Nausea Stomach pain Vomiting This list may not describe all possible side effects. Call your doctor for medical advice about side effects. You may report side effects to FDA at 1-800-FDA-1088. Where should I keep my medication? Keep out of the reach of children. Store  unopened pens in a refrigerator between 2 and 8 degrees C (36 and 46 degrees F). Do not freeze. Protect from light and heat. After you first use the pen, it can be stored for 56 days at room temperature between 15 and 30 degrees C (59 and 86 degrees F) or in a refrigerator. Throw away your used pen after 56 days or after the expiration date, whichever comes first. Do not store your pen with the needle attached. If the needle is left on, medication may leak from the pen. NOTE: This sheet is a summary. It may not cover all possible information. If you have questions about this medicine, talk to your doctor, pharmacist, or health care provider.  2023 Elsevier/Gold Standard (2020-10-20 00:00:00)  

## 2022-01-31 LAB — MICROALBUMIN / CREATININE URINE RATIO
Creatinine, Urine: 102.8 mg/dL
Microalb/Creat Ratio: 24 mg/g creat (ref 0–29)
Microalbumin, Urine: 24.5 ug/mL

## 2022-01-31 LAB — HCV INTERPRETATION

## 2022-01-31 LAB — HCV AB W REFLEX TO QUANT PCR: HCV Ab: NONREACTIVE

## 2022-02-05 ENCOUNTER — Telehealth: Payer: Self-pay | Admitting: Emergency Medicine

## 2022-02-05 NOTE — Telephone Encounter (Signed)
Does another PA need to be done for her ozempic, last message states that she is approved until 11/2022

## 2022-02-05 NOTE — Telephone Encounter (Signed)
Copied from CRM [id number]. Topic: General - Inquiry >> Feb 05, 2022  1:10 PM Marlow Baars wrote: Reason for CRM: Patient had an appointment last week with Dr Alvis Lemmings and the patient discussed getting a prescription for Ozempic at that appointment. Patient is calling back in stating the pharmacy has the prescription and they need a prior authorization. A fax was sent last Monday as well as today from the pharmacy regarding the prior authorization. Please assist patient further.

## 2022-02-06 ENCOUNTER — Other Ambulatory Visit: Payer: Self-pay

## 2022-02-07 NOTE — Telephone Encounter (Signed)
Noted  

## 2022-02-19 ENCOUNTER — Other Ambulatory Visit: Payer: Self-pay | Admitting: Internal Medicine

## 2022-02-19 DIAGNOSIS — E1169 Type 2 diabetes mellitus with other specified complication: Secondary | ICD-10-CM

## 2022-02-20 NOTE — Telephone Encounter (Signed)
Requested Prescriptions  Pending Prescriptions Disp Refills  . atorvastatin (LIPITOR) 20 MG tablet [Pharmacy Med Name: ATORVASTATIN 20MG  TABLETS] 90 tablet 2    Sig: TAKE 1 TABLET(20 MG) BY MOUTH DAILY     Cardiovascular:  Antilipid - Statins Failed - 02/19/2022  3:10 AM      Failed - Lipid Panel in normal range within the last 12 months    Cholesterol, Total  Date Value Ref Range Status  11/23/2021 199 100 - 199 mg/dL Final   LDL Chol Calc (NIH)  Date Value Ref Range Status  11/23/2021 118 (H) 0 - 99 mg/dL Final   HDL  Date Value Ref Range Status  11/23/2021 53 >39 mg/dL Final   Triglycerides  Date Value Ref Range Status  11/23/2021 161 (H) 0 - 149 mg/dL Final         Passed - Patient is not pregnant      Passed - Valid encounter within last 12 months    Recent Outpatient Visits          3 weeks ago Type 2 diabetes mellitus with peripheral neuropathy (HCC)   [facility] [place], [place], [place]   2 months ago Type 2 diabetes mellitus with peripheral neuropathy (HCC)   [facility] [place], [place]   11 months ago History of diverticulitis   [facility], [place], PA-C   1 year ago Routine screening for STI (sexually transmitted infection)   [facility] at [facility], NP   1 year ago Gross hematuria   [facility], MD

## 2022-02-23 ENCOUNTER — Other Ambulatory Visit: Payer: Self-pay | Admitting: Internal Medicine

## 2022-02-23 DIAGNOSIS — I152 Hypertension secondary to endocrine disorders: Secondary | ICD-10-CM

## 2022-04-03 ENCOUNTER — Ambulatory Visit: Payer: Medicaid Other | Attending: Family Medicine | Admitting: Family Medicine

## 2022-04-03 ENCOUNTER — Encounter: Payer: Self-pay | Admitting: Family Medicine

## 2022-04-03 VITALS — BP 143/85 | HR 79 | Temp 98.0°F | Ht 68.0 in | Wt 339.0 lb

## 2022-04-03 DIAGNOSIS — E1159 Type 2 diabetes mellitus with other circulatory complications: Secondary | ICD-10-CM | POA: Diagnosis not present

## 2022-04-03 DIAGNOSIS — M792 Neuralgia and neuritis, unspecified: Secondary | ICD-10-CM

## 2022-04-03 DIAGNOSIS — G44209 Tension-type headache, unspecified, not intractable: Secondary | ICD-10-CM

## 2022-04-03 DIAGNOSIS — I152 Hypertension secondary to endocrine disorders: Secondary | ICD-10-CM

## 2022-04-03 DIAGNOSIS — E1142 Type 2 diabetes mellitus with diabetic polyneuropathy: Secondary | ICD-10-CM

## 2022-04-03 LAB — GLUCOSE, POCT (MANUAL RESULT ENTRY): POC Glucose: 255 mg/dl — AB (ref 70–99)

## 2022-04-03 MED ORDER — ACCU-CHEK SOFTCLIX LANCETS MISC: 12 refills | Status: DC

## 2022-04-03 MED ORDER — LIDOCAINE 5 % EX PTCH: 1.0000 | MEDICATED_PATCH | CUTANEOUS | 1 refills | Status: DC

## 2022-04-03 MED ORDER — TOPIRAMATE 25 MG PO TABS: 25.0000 mg | ORAL_TABLET | Freq: Two times a day (BID) | ORAL | 3 refills | Status: DC

## 2022-04-03 NOTE — Patient Instructions (Signed)
Tension Headache, Adult A tension headache is a feeling of pain, pressure, or aching over the front and sides of the head. The pain can be dull, or it can feel tight. There are two types of tension headache: Episodic tension headache. This is when the headaches happen fewer than 15 days a month. Chronic tension headache. This is when the headaches happen more than 15 days a month during a 3-month period. A tension headache can last from 30 minutes to several days. It is the most common kind of headache. Tension headaches are not normally associated with nausea or vomiting, and they do not get worse with physical activity. What are the causes? The exact cause of this condition is not known. Tension headaches are often triggered by stress, anxiety, or depression. Other triggers may include: Alcohol. Too much caffeine or caffeine withdrawal. Respiratory infections, such as colds, flu, or sinus infections. Dental problems or teeth clenching. Fatigue. Holding your head and neck in the same position for a long period of time, such as while using a computer. Smoking. Arthritis of the neck. What are the signs or symptoms? Symptoms of this condition include: A feeling of pressure or tightness around the head. Dull, aching head pain. Pain over the front and sides of the head. Tenderness in the muscles of the head, neck, and shoulders. How is this diagnosed? This condition may be diagnosed based on your symptoms, your medical history, and a physical exam. If your symptoms are severe or unusual, you may have imaging tests, such as a CT scan or an MRI of your head. Your vision may also be checked. How is this treated? This condition may be treated with lifestyle changes and with medicines that help relieve symptoms. Follow these instructions at home: Managing pain Take over-the-counter and prescription medicines only as told by your health care provider. When you have a headache, lie down in a dark,  quiet room. If directed, put ice on your head and neck. To do this: Put ice in a plastic bag. Place a towel between your skin and the bag. Leave the ice on for 20 minutes, 2-3 times a day. Remove the ice if your skin turns bright red. This is very important. If you cannot feel pain, heat, or cold, you have a greater risk of damage to the area. If directed, apply heat to the back of your neck as often as told by your health care provider. Use the heat source that your health care provider recommends, such as a moist heat pack or a heating pad. Place a towel between your skin and the heat source. Leave the heat on for 20-30 minutes. Remove the heat if your skin turns bright red. This is especially important if you are unable to feel pain, heat, or cold. You have a greater risk of getting burned. Eating and drinking Eat meals on a regular schedule. If you drink alcohol: Limit how much you have to: 0-1 drink a day for women who are not pregnant. 0-2 drinks a day for men. Know how much alcohol is in your drink. In the [place] one drink equals one 12 oz bottle of beer (355 mL), one 5 oz glass of wine (148 mL), or one 1 oz glass of hard liquor (44 mL). Drink enough fluid to keep your urine pale yellow. Decrease your caffeine intake, or stop using caffeine. Lifestyle Get 7-9 hours of sleep each night, or get the amount of sleep recommended by your health care provider. At bedtime,   remove computers, phones, and tablets from your room. Find ways to manage your stress. This may include: Exercise. Deep breathing exercises. Yoga. Listening to music. Positive mental imagery. Try to sit up straight and avoid tensing your muscles. Do not use any products that contain nicotine or tobacco. These include cigarettes, chewing tobacco, and vaping devices, such as e-cigarettes. If you need help quitting, ask your health care provider. General instructions  Avoid any headache triggers. Keep a journal to help  find out what may trigger your headaches. For example, write down: What you eat and drink. How much sleep you get. Any change to your diet or medicines. Keep all follow-up visits. This is important. Contact a health care provider if: Your headache does not get better. Your headache comes back. You are sensitive to sounds, light, or smells because of a headache. You have nausea or you vomit. Your stomach hurts. Get help right away if: You suddenly develop a severe headache, along with any of the following: A stiff neck. Nausea and vomiting. Confusion. Weakness in one part or one side of your body. Double vision or loss of vision. Shortness of breath. Rash. Unusual sleepiness. Fever or chills. Trouble speaking. Pain in your eye or ear. Trouble walking or balancing. Feeling faint or passing out. Summary A tension headache is a feeling of pain, pressure, or aching over the front and sides of the head. A tension headache can last from 30 minutes to several days. It is the most common kind of headache. This condition may be diagnosed based on your symptoms, your medical history, and a physical exam. This condition may be treated with lifestyle changes and with medicines that help relieve symptoms. This information is not intended to replace advice given to you by your health care provider. Make sure you discuss any questions you have with your health care provider. Document Revised: 04/14/2020 Document Reviewed: 04/14/2020 Elsevier Patient Education  2023 [employer]  

## 2022-04-03 NOTE — Progress Notes (Signed)
Subjective:  Patient ID: Nicole [name], female    DOB: August 14, 1975  Age: 46 y.o. MRN: [id number]  CC: Hypertension   HPI Nicole [name] is a 46 y.o. year old female with a history of  morbid obesity, Type 2 DM (A1c 11.2), diabetic neuropathy osteoarthritis of the knee, tobacco abuse here for follow-up of chronic medical conditions  Interval History: At her last visit she had complained of nausea with Trulicity and this was substituted with Ozempic. She still has nausea with Ozempic but this lasts for 2 days and she would like to remain on Ozempic.  She has not been checking her blood sugars at home as she ran out of lancets. Neuropathy had also been uncontrolled on gabapentin and she had complained of sedation as well.  Gabapentin was switched from daytime dose to nighttime dose.  Her blood pressure had also been elevated due to medication nonadherence and she is doing better with regards to her antihypertensive but states she sometimes forgets to take it when she is rushing out of the house.  She wakes up with a headache and this Has been present for months which starts from her occiput and radiates to the left lateral aspect of her head and to her left retro orbital region present for the last couple of months. It is relieved with BC powder.  She has no photophobia, phonophobia, tearing of her eyes or erythema of her eyes.  She wears reading glasses   She has sharp pains which occur in different locations - sharp, shooting and occurs mostly in her right underarm and makes her right upper extremity feel weird.  Sometimes occurs with in her upper back and left upper chest wall.  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 Past Medical History:  Diagnosis Date   Arthritis    knees   Diabetes mellitus without complication ([place])    Hyperlipidemia    Hypertension    Ovarian cyst    Pregnancy induced hypertension     Past Surgical History:  Procedure Laterality Date   APPENDECTOMY      CESAREAN SECTION     HERNIA REPAIR     LAPAROSCOPIC APPENDECTOMY N/A 03/25/2014   Procedure: APPENDECTOMY LAPAROSCOPIC;  Surgeon: Gwenyth Ober, MD;  Location: [facility];  Service: General;  Laterality: N/A;    Family History  Problem Relation Age of Onset   Hypertension Mother    Cancer Mother        pancreatic cancer   Arthritis Father    Heart disease Maternal Grandmother     Social History   Socioeconomic History   Marital status: Single    Spouse name: Not on file   Number of children: 2   Years of education: Not on file   Highest education level: Not on file  Occupational History   Not on file  Tobacco Use   Smoking status: Former    Packs/day: 0.50    Years: 27.00    Total pack years: 13.50    Types: Cigarettes    Quit date: 10/28/2020    Years since quitting: 1.4   Smokeless tobacco: Never   Tobacco comments:    6 cigs daily  Vaping Use   Vaping Use: Never used  Substance and Sexual Activity   Alcohol use: Not Currently    Comment: occ   Drug use: Yes    Frequency: 7.0 times per week    Types: Marijuana   Sexual activity: Not Currently  Comment: 5 years  Other Topics Concern   Not on file  Social History Narrative   Not on file   Social Determinants of Health   Financial Resource Strain: Not on file  Food Insecurity: No Food Insecurity (05/24/2020)   Hunger Vital Sign    Worried About Running Out of Food in the Last Year: Never true    Ran Out of Food in the Last Year: Never true  Transportation Needs: No Transportation Needs (05/24/2020)   PRAPARE - Hydrologist (Medical): No    Lack of Transportation (Non-Medical): No  Physical Activity: Not on file  Stress: Not on file  Social Connections: Not on file    No Known Allergies  Outpatient Medications Prior to Visit  Medication Sig Dispense Refill   albuterol (VENTOLIN HFA) 108 (90 Base) MCG/ACT inhaler Inhale 2 puffs into the lungs every 6 (six) hours as needed  for wheezing or shortness of breath. 18 g 6   atorvastatin (LIPITOR) 20 MG tablet TAKE 1 TABLET(20 MG) BY MOUTH DAILY 90 tablet 2   blood glucose meter kit and supplies KIT Dispense based on patient and insurance preference. Use up to four times daily as directed. 1 each 0   budesonide-formoterol (SYMBICORT) 160-4.5 MCG/ACT inhaler Inhale 2 puffs into the lungs daily. 1 each 0   gabapentin (NEURONTIN) 300 MG capsule Take 1 capsule (300 mg total) by mouth at bedtime. 30 capsule 3   glucose blood (ACCU-CHEK GUIDE) test strip Use as instructed 100 each 12   glucose blood (FREESTYLE LITE) test strip Use as instructed 100 each 12   insulin glargine (LANTUS) 100 UNIT/ML Solostar Pen Inject 25 Units into the skin daily. 15 mL 3   Semaglutide,0.25 or 0.5MG/DOS, (OZEMPIC, 0.25 OR 0.5 MG/DOSE,) 2 MG/1.5ML SOPN Inject 0.25 mg into the skin once a week. 2 mL 3   tiotropium (SPIRIVA HANDIHALER) 18 MCG inhalation capsule Place 1 capsule (18 mcg total) into inhaler and inhale daily. 30 capsule 6   valsartan (DIOVAN) 40 MG tablet TAKE 1 TABLET(40 MG) BY MOUTH DAILY 90 tablet 1   No facility-administered medications prior to visit.     ROS Review of Systems  Constitutional:  Negative for activity change, appetite change and fatigue.  HENT:  Negative for congestion, sinus pressure and sore throat.   Eyes:  Negative for visual disturbance.  Respiratory:  Negative for cough, chest tightness, shortness of breath and wheezing.   Cardiovascular:  Negative for chest pain and palpitations.  Gastrointestinal:  Negative for abdominal distention, abdominal pain and constipation.  Endocrine: Negative for polydipsia.  Genitourinary:  Negative for dysuria and frequency.  Musculoskeletal:        See HPI  Skin:  Negative for rash.  Neurological:  Positive for headaches. Negative for tremors, light-headedness and numbness.  Hematological:  Does not bruise/bleed easily.  Psychiatric/Behavioral:  Negative for agitation  and behavioral problems.     Objective:  BP (!) 143/85   Pulse 79   Temp 98 F (36.7 C) (Oral)   Ht 5' 8"  (1.727 m)   Wt (!) 339 lb (153.8 kg)   SpO2 98%   BMI 51.54 kg/m      04/03/2022    2:20 PM 01/29/2022    9:31 AM 11/23/2021   11:04 AM  BP/Weight  Systolic BP 277 412 878  Diastolic BP 85 93 85  Wt. (Lbs) 339 347.6 347  BMI 51.54 kg/m2 52.85 kg/m2 52.76 kg/m2  Physical Exam Constitutional:      Appearance: She is well-developed.  Cardiovascular:     Rate and Rhythm: Normal rate.     Heart sounds: Normal heart sounds. No murmur heard. Pulmonary:     Effort: Pulmonary effort is normal.     Breath sounds: Normal breath sounds. No wheezing or rales.  Chest:     Chest wall: No tenderness.  Abdominal:     General: Bowel sounds are normal. There is no distension.     Palpations: Abdomen is soft. There is no mass.     Tenderness: There is no abdominal tenderness.  Musculoskeletal:        General: Normal range of motion.     Right lower leg: No edema.     Left lower leg: No edema.     Comments: Negative Neer, negative Hawkins signs  Neurological:     Mental Status: She is alert and oriented to person, place, and time.  Psychiatric:        Mood and Affect: Mood normal.        Latest Ref Rng & Units 11/23/2021   12:23 PM 09/06/2021    2:11 AM 04/05/2021   12:07 PM  CMP  Glucose 70 - 99 mg/dL 316  293  183   BUN 6 - 24 mg/dL 13  17  11    Creatinine 0.57 - 1.00 mg/dL 1.09  1.39  0.85   Sodium 134 - 144 mmol/L 139  138  139   Potassium 3.5 - 5.2 mmol/L 4.9  4.2  5.0   Chloride 96 - 106 mmol/L 101  103  100   CO2 20 - 29 mmol/L 26  26  26    Calcium 8.7 - 10.2 mg/dL 9.5  9.4  9.6   Total Protein 6.0 - 8.5 g/dL 6.7     Total Bilirubin 0.0 - 1.2 mg/dL 0.3     Alkaline Phos 44 - 121 IU/L 146     AST 0 - 40 IU/L 28     ALT 0 - 32 IU/L 33       Lipid Panel     Component Value Date/Time   CHOL 199 11/23/2021 1223   TRIG 161 (H) 11/23/2021 1223   HDL 53  11/23/2021 1223   CHOLHDL 3.8 11/23/2021 1223   CHOLHDL 5.1 02/13/2021 0932   VLDL 26 02/13/2021 0932   LDLCALC 118 (H) 11/23/2021 1223    CBC    Component Value Date/Time   WBC 11.1 (H) 09/06/2021 0211   RBC 5.16 (H) 09/06/2021 0211   HGB 13.4 09/06/2021 0211   HGB 12.6 02/28/2021 1629   HCT 43.7 09/06/2021 0211   HCT 38.4 02/28/2021 1629   PLT 242 09/06/2021 0211   PLT 265 02/28/2021 1629   MCV 84.7 09/06/2021 0211   MCV 82 02/28/2021 1629   MCH 26.0 09/06/2021 0211   MCHC 30.7 09/06/2021 0211   RDW 14.0 09/06/2021 0211   RDW 13.4 02/28/2021 1629   LYMPHSABS 2.8 02/28/2021 1629   MONOABS 0.7 02/10/2021 1845   EOSABS 0.2 02/28/2021 1629   BASOSABS 0.1 02/28/2021 1629    Lab Results  Component Value Date   HGBA1C 11.2 (A) 01/29/2022    Assessment & Plan:  1. Tension headache Symptoms are not in keeping with a migraine Counseled on adverse effects and benefits of Topamax and after shared decision making she is willing to commence Topamax - topiramate (TOPAMAX) 25 MG tablet; Take 1 tablet (25 mg total) by  mouth 2 (two) times daily.  Dispense: 60 tablet; Refill: 3  2. Type 2 diabetes mellitus with peripheral neuropathy (HCC) Uncontrolled with A1c of 11.2 due to previous medication nonadherence Goal is less than 7.0 She is now adherent with her medications Despite nausea she would like to remain on Ozempic and has been advised to eat small frequent portions Blood sugar of 255-this is 1 hour postprandial hence I will make no regimen changes today - Accu-Chek Softclix Lancets lancets; Use as instructed tid before meals  Dispense: 100 each; Refill: 12  3. Hypertension associated with type 2 diabetes mellitus ([place]) Slightly above goal due to medication nonadherence If blood pressure is still above goal at next visit consider increasing valsartan dose Counseled on blood pressure goal of less than 130/80, low-sodium, DASH diet, medication compliance, 150 minutes of moderate  intensity exercise per week. Discussed medication compliance, adverse effects.  4. 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 Remove & Discard patch within 12 hours or as directed by MD  Dispense: 30 patch; Refill: 1    Meds ordered this encounter  Medications   Accu-Chek Softclix Lancets lancets    Sig: Use as instructed tid before meals    Dispense:  100 each    Refill:  12   topiramate (TOPAMAX) 25 MG tablet    Sig: Take 1 tablet (25 mg total) by mouth 2 (two) times daily.    Dispense:  60 tablet    Refill:  3   lidocaine (LIDODERM) 5 %    Sig: Place 1 patch onto the skin daily. Remove & Discard patch within 12 hours or as directed by MD    Dispense:  30 patch    Refill:  1    Follow-up: Return in about 3 months (around 07/03/2022).       [name], MD, FAAFP. [facility] and [place], [place]   04/03/2022, 2:53 PM

## 2022-04-21 ENCOUNTER — Emergency Department (HOSPITAL_BASED_OUTPATIENT_CLINIC_OR_DEPARTMENT_OTHER)
Admission: EM | Admit: 2022-04-21 | Discharge: 2022-04-21 | Disposition: A | Discharge status: Home / Self Care | Payer: Medicaid Other | Attending: Emergency Medicine | Admitting: Emergency Medicine

## 2022-04-21 ENCOUNTER — Encounter (HOSPITAL_BASED_OUTPATIENT_CLINIC_OR_DEPARTMENT_OTHER): Payer: Self-pay | Admitting: *Deleted

## 2022-04-21 ENCOUNTER — Emergency Department (HOSPITAL_BASED_OUTPATIENT_CLINIC_OR_DEPARTMENT_OTHER): Payer: Medicaid Other

## 2022-04-21 DIAGNOSIS — Z794 Long term (current) use of insulin: Secondary | ICD-10-CM | POA: Diagnosis not present

## 2022-04-21 DIAGNOSIS — E119 Type 2 diabetes mellitus without complications: Secondary | ICD-10-CM | POA: Insufficient documentation

## 2022-04-21 DIAGNOSIS — Z7984 Long term (current) use of oral hypoglycemic drugs: Secondary | ICD-10-CM | POA: Insufficient documentation

## 2022-04-21 DIAGNOSIS — H748X3 Other specified disorders of middle ear and mastoid, bilateral: Secondary | ICD-10-CM | POA: Diagnosis not present

## 2022-04-21 DIAGNOSIS — I10 Essential (primary) hypertension: Secondary | ICD-10-CM | POA: Insufficient documentation

## 2022-04-21 DIAGNOSIS — R059 Cough, unspecified: Secondary | ICD-10-CM | POA: Diagnosis not present

## 2022-04-21 DIAGNOSIS — J029 Acute pharyngitis, unspecified: Secondary | ICD-10-CM | POA: Diagnosis present

## 2022-04-21 DIAGNOSIS — R0600 Dyspnea, unspecified: Secondary | ICD-10-CM | POA: Diagnosis not present

## 2022-04-21 DIAGNOSIS — U071 COVID-19: Secondary | ICD-10-CM | POA: Insufficient documentation

## 2022-04-21 DIAGNOSIS — Z79899 Other long term (current) drug therapy: Secondary | ICD-10-CM | POA: Diagnosis not present

## 2022-04-21 DIAGNOSIS — J9811 Atelectasis: Secondary | ICD-10-CM | POA: Diagnosis not present

## 2022-04-21 LAB — CBC WITH DIFFERENTIAL/PLATELET
Abs Immature Granulocytes: 0.03 10*3/uL (ref 0.00–0.07)
Basophils Absolute: 0 10*3/uL (ref 0.0–0.1)
Basophils Relative: 0 %
Eosinophils Absolute: 0.1 10*3/uL (ref 0.0–0.5)
Eosinophils Relative: 1 %
HCT: 39.3 % (ref 36.0–46.0)
Hemoglobin: 12.7 g/dL (ref 12.0–15.0)
Immature Granulocytes: 0 %
Lymphocytes Relative: 5 %
Lymphs Abs: 0.4 10*3/uL — ABNORMAL LOW (ref 0.7–4.0)
MCH: 26.3 pg (ref 26.0–34.0)
MCHC: 32.3 g/dL (ref 30.0–36.0)
MCV: 81.5 fL (ref 80.0–100.0)
Monocytes Absolute: 1 10*3/uL (ref 0.1–1.0)
Monocytes Relative: 14 %
Neutro Abs: 5.8 10*3/uL (ref 1.7–7.7)
Neutrophils Relative %: 80 %
Platelets: 197 10*3/uL (ref 150–400)
RBC: 4.82 MIL/uL (ref 3.87–5.11)
RDW: 13.9 % (ref 11.5–15.5)
WBC: 7.2 10*3/uL (ref 4.0–10.5)
nRBC: 0 % (ref 0.0–0.2)

## 2022-04-21 LAB — BASIC METABOLIC PANEL
Anion gap: 10 (ref 5–15)
BUN: 11 mg/dL (ref 6–20)
CO2: 25 mmol/L (ref 22–32)
Calcium: 9.4 mg/dL (ref 8.9–10.3)
Chloride: 100 mmol/L (ref 98–111)
Creatinine, Ser: 0.97 mg/dL (ref 0.44–1.00)
GFR, Estimated: >60 mL/min (ref >60)
Glucose, Bld: 168 mg/dL — ABNORMAL HIGH (ref 70–99)
Potassium: 3.9 mmol/L (ref 3.5–5.1)
Sodium: 135 mmol/L (ref 135–145)

## 2022-04-21 LAB — GROUP A STREP BY PCR: Group A Strep by PCR: NOT DETECTED

## 2022-04-21 LAB — SARS CORONAVIRUS 2 BY RT PCR: SARS Coronavirus 2 by RT PCR: POSITIVE — AB

## 2022-04-21 MED ORDER — DIPHENHYDRAMINE HCL 50 MG/ML IJ SOLN
25.0000 mg | Freq: Once | INTRAMUSCULAR | Status: AC
  Administered 2022-04-21: 25 mg via INTRAVENOUS
  Filled 2022-04-21: qty 1

## 2022-04-21 MED ORDER — MOLNUPIRAVIR 200 MG PO CAPS: 4.0000 | ORAL_CAPSULE | Freq: Two times a day (BID) | ORAL | 0 refills | Status: AC

## 2022-04-21 MED ORDER — ACETAMINOPHEN 325 MG PO TABS
650.0000 mg | ORAL_TABLET | Freq: Four times a day (QID) | ORAL | Status: DC | PRN
  Administered 2022-04-21: 650 mg via ORAL
  Filled 2022-04-21: qty 2

## 2022-04-21 MED ORDER — KETOROLAC TROMETHAMINE 15 MG/ML IJ SOLN
15.0000 mg | Freq: Once | INTRAMUSCULAR | Status: DC
  Administered 2022-04-21: 15 mg via INTRAVENOUS
  Filled 2022-04-21: qty 1

## 2022-04-21 MED ORDER — SODIUM CHLORIDE 0.9 % IV BOLUS
1000.0000 mL | Freq: Once | INTRAVENOUS | Status: AC
  Administered 2022-04-21: 1000 mL via INTRAVENOUS

## 2022-04-21 MED ORDER — CETIRIZINE HCL 10 MG PO TABS: 10.0000 mg | ORAL_TABLET | Freq: Every day | ORAL | 0 refills | Status: DC

## 2022-04-21 NOTE — ED Provider Notes (Signed)
[facility] EMERGENCY DEPT Provider Note   CSN: [id number] Arrival date & time: 04/21/22  1126     History  Chief Complaint  Patient presents with   Chills   Sore Throat   Shortness of Breath    Nicole [name] is a 46 y.o. female with Hx of HTN, appendicitis, DMT2, anxiety, depression, diverticulitis, hyperlipidemia.  Presenting to the ED today with multiple complaints.  Initially started yesterday morning with a headache, began to develop sore throat and body aches.  Then began having coughing/dry heaving spells without vomiting.  Mild intermittent nausea.  Began feeling short of breath, describing it as being unable to catch her breath, worse when feeling anxious.  Also with chills, however without fevers.  Hx of appendectomy 2015.  Mild RUQ and epigastric tenderness is not new, is chronic issue per patient and her significant other.  The history is provided by the patient and medical records.  Sore Throat Associated symptoms include shortness of breath.  Shortness of Breath    Home Medications Prior to Admission medications   Medication Sig Start Date End Date Taking? Authorizing Provider  cetirizine (ZYRTEC ALLERGY) 10 MG tablet Take 1 tablet (10 mg total) by mouth daily. 04/21/22  Yes Prince Rome, PA-C  molnupiravir EUA (LAGEVRIO) 200 MG CAPS capsule Take 4 capsules (800 mg total) by mouth 2 (two) times daily for 5 days. 04/21/22 04/26/22 Yes Prince Rome, PA-C  Accu-Chek Softclix Lancets lancets Use as instructed tid before meals 04/03/22   [name], MD  albuterol (VENTOLIN HFA) 108 (90 Base) MCG/ACT inhaler Inhale 2 puffs into the lungs every 6 (six) hours as needed for wheezing or shortness of breath. 04/20/20   [name], MD  atorvastatin (LIPITOR) 20 MG tablet TAKE 1 TABLET(20 MG) BY MOUTH DAILY 02/20/22   [name], MD  blood glucose meter kit and supplies KIT Dispense based on patient and insurance preference. Use up to four  times daily as directed. 02/15/21   Little Ishikawa, MD  budesonide-formoterol [facility]) 160-4.5 MCG/ACT inhaler Inhale 2 puffs into the lungs daily. 08/28/21   Lamptey, Myrene Galas, MD  gabapentin (NEURONTIN) 300 MG capsule Take 1 capsule (300 mg total) by mouth at bedtime. 11/23/21   Ladell Pier, MD  glucose blood (ACCU-CHEK GUIDE) test strip Use as instructed 11/23/21   Ladell Pier, MD  glucose blood (FREESTYLE LITE) test strip Use as instructed 10/02/55   Campbell Stall P, DO  insulin glargine (LANTUS) 100 UNIT/ML Solostar Pen Inject 25 Units into the skin daily. 01/29/22   [name], MD  lidocaine (LIDODERM) 5 % Place 1 patch onto the skin daily. Remove & Discard patch within 12 hours or as directed by MD 04/03/22   [name], MD  Semaglutide,0.25 or 0.5MG/DOS, (OZEMPIC, 0.25 OR 0.5 MG/DOSE,) 2 MG/1.5ML SOPN Inject 0.25 mg into the skin once a week. 01/29/22   [name], MD  tiotropium (SPIRIVA HANDIHALER) 18 MCG inhalation capsule Place 1 capsule (18 mcg total) into inhaler and inhale daily. 06/15/19   [name], MD  topiramate (TOPAMAX) 25 MG tablet Take 1 tablet (25 mg total) by mouth 2 (two) times daily. 04/03/22   [name], MD  valsartan (DIOVAN) 40 MG tablet TAKE 1 TABLET(40 MG) BY MOUTH DAILY 02/23/22   [name], MD  metoprolol tartrate (LOPRESSOR) 100 MG tablet Take 1 tablet by mouth once for procedure. 04/05/21   O'Neal, Cassie Freer, MD      Allergies  Patient has no known allergies.    Review of Systems   Review of Systems  Respiratory:  Positive for shortness of breath.     Physical Exam Updated Vital Signs BP (!) 175/99 (BP Location: Left Arm)   Pulse 81   Temp 99.4 F (37.4 C) (Oral)   Resp (!) 21   SpO2 95%  Physical Exam Vitals and nursing note reviewed.  Constitutional:      General: She is not in acute distress.    Appearance: She is well-developed. She is obese. She is not ill-appearing, toxic-appearing or diaphoretic.      Comments: Uncomfortable appearing  HENT:     Head: Normocephalic and atraumatic.     Right Ear: Ear canal normal. No drainage or tenderness. A middle ear effusion is present.     Left Ear: Ear canal normal. No drainage or tenderness. A middle ear effusion is present.     Nose: Congestion and rhinorrhea (Clear) present.     Mouth/Throat:     Mouth: Mucous membranes are moist. No oral lesions.     Pharynx: Oropharynx is clear. Uvula midline. No pharyngeal swelling, oropharyngeal exudate, posterior oropharyngeal erythema or uvula swelling.     Tonsils: No tonsillar exudate or tonsillar abscesses.  Eyes:     General: Lids are normal. Gaze aligned appropriately.     Extraocular Movements: Extraocular movements intact.     Conjunctiva/sclera: Conjunctivae normal.     Pupils: Pupils are equal, round, and reactive to light.  Neck:     Thyroid: No thyromegaly.     Meningeal: Brudzinski's sign absent.     Comments: No meningismus or torticollis.  Very supple on exam. Cardiovascular:     Rate and Rhythm: Normal rate and regular rhythm.     Pulses: Normal pulses.     Heart sounds: Normal heart sounds. No murmur heard. Pulmonary:     Effort: Pulmonary effort is normal. No accessory muscle usage or respiratory distress.     Breath sounds: Normal breath sounds. No stridor. No wheezing, rhonchi or rales.  Chest:     Chest wall: Tenderness (Chest wall) present. No deformity or crepitus.  Abdominal:     Palpations: Abdomen is soft.     Tenderness: There is abdominal tenderness (Chronic RUQ and epigastric per pt, not acute). There is no guarding.  Musculoskeletal:        General: No swelling.     Cervical back: Neck supple.     Right lower leg: No tenderness.     Left lower leg: No tenderness.     Comments: Able to wiggle toes, wiggle fingers, move arms and legs without difficulty  Lymphadenopathy:     Cervical: No cervical adenopathy.  Skin:    General: Skin is warm and dry.     Capillary  Refill: Capillary refill takes less than 2 seconds.     Comments: Diffuse tenderness to entire body, accompanied with hyperventilation, appears anticipatory  Neurological:     Mental Status: She is alert and oriented to person, place, and time.     GCS: GCS eye subscore is 4. GCS verbal subscore is 5. GCS motor subscore is 6.     Cranial Nerves: No dysarthria or facial asymmetry.     Sensory: Sensation is intact.     Comments: ROM, coordination, and gait appears grossly intact.  No truncal deviation.  No tremor, seizure activity, or appreciated weakness.  CN III-XII appear grossly intact.    Psychiatric:  Mood and Affect: Mood is anxious. Affect is tearful.     ED Results / Procedures / Treatments   Labs (all labs ordered are listed, but only abnormal results are displayed) Labs Reviewed  SARS CORONAVIRUS 2 BY RT PCR - Abnormal; Notable for the following components:      Result Value   SARS Coronavirus 2 by RT PCR POSITIVE (*)    All other components within normal limits  BASIC METABOLIC PANEL - Abnormal; Notable for the following components:   Glucose, Bld 168 (*)    All other components within normal limits  CBC WITH DIFFERENTIAL/PLATELET - Abnormal; Notable for the following components:   Lymphs Abs 0.4 (*)    All other components within normal limits  GROUP A STREP BY PCR    EKG EKG Interpretation  Date/Time:  Saturday April 21 2022 13:51:20 EDT Ventricular Rate:  84 PR Interval:  168 QRS Duration: 124 QT Interval:  395 QTC Calculation: 467 R Axis:   0 Text Interpretation: Sinus rhythm Right bundle branch block Confirmed by Lennice Sites ([id number]) on 04/21/2022 2:02:50 PM  Radiology DG Chest Port 1 View  Result Date: 04/21/2022 CLINICAL DATA:  Dyspnea, cough, COVID positive EXAM: PORTABLE CHEST 1 VIEW COMPARISON:  09/06/2021 chest radiograph. FINDINGS: Stable cardiomediastinal silhouette with top normal heart size. No pneumothorax. No pleural effusion. No  pulmonary edema. No acute consolidative airspace disease. Mild streaky right lung base atelectasis. IMPRESSION: Mild streaky right lung base atelectasis. Electronically Signed   By: Ilona Sorrel M.D.   On: 04/21/2022 13:16    Procedures Procedures    Medications Ordered in ED Medications  acetaminophen (TYLENOL) tablet 650 mg (650 mg Oral Given 04/21/22 1326)  diphenhydrAMINE (BENADRYL) injection 25 mg (has no administration in time range)  ketorolac (TORADOL) 15 MG/ML injection 15 mg (has no administration in time range)  sodium chloride 0.9 % bolus 1,000 mL (1,000 mLs Intravenous New Bag/Given 04/21/22 1312)    ED Course/ Medical Decision Making/ A&P                           Medical Decision Making Amount and/or Complexity of Data Reviewed Labs: ordered. Radiology: ordered.  Risk OTC drugs. Prescription drug management.   46 y.o. female presents to the ED for concern of Chills, Sore Throat, and Shortness of Breath   This involves an extensive number of treatment options, and is a complaint that carries with it a high risk of complications and morbidity.  The emergent differential diagnosis prior to evaluation includes, but is not limited to: Acute COVID-19, viral pharyngitis, bacterial pharyngitis, bronchitis, pneumonia  This is not an exhaustive differential.   Past Medical History / Co-morbidities / Social History: Hx of HTN, appendicitis, DMT2, anxiety, depression, diverticulitis, hyperlipidemia Social Determinants of Health include: None  Additional History:  None  Lab Tests: I ordered, and personally interpreted labs.  The pertinent results include:   COVID-19: Positive Group strep negative CBC and BMP unremarkable  Imaging Studies: I ordered imaging studies including CXR.   I independently visualized and interpreted imaging which showed no evidence of pneumothorax, pneumonia, pleural effusion, or pulmonary edema.  Notable stable cardiomegaly. I agree with the  radiologist interpretation.  ED Course: Pt well-appearing on exam.  Nontoxic, nonseptic appearing in NAD.  AAOx4.  Afebrile.  Appears uncomfortable, covered with blankets for additional comfort.  Presenting with multiple upper respiratory flulike symptoms including myalgias, sore throat, congestion, headache.  Mild ear effusions on  exam without evidence of otitis media.  Without ear pain or affected hearing.  Airway patent, able to swallow without difficulty.  Headache features described as sinus headache, without neurodeficits or visual changes.  Tylenol provided.  Low clinical suspicion for RPA or PTA.  Mild cervical lymphadenopathy.  Without dysphagia, significant erythema or swelling of the oropharynx, or meningismus.  Neuro exam unremarkable.  Low suspicion for meningitis, CVA, or other acute neurological condition.  Also complaining of shortness of breath.  Imaging and physical exam provides low suspicion for pneumothorax, pneumonia, pleural effusion, or pulmonary edema.  Stable cardiomegaly noted on exam, consistent with prior imaging.  With some mild chest wall tenderness, appears consistent with overall myalgias.  Without chest pain or dyspnea on exertion.  EKG without significant changes.  Tested positive for COVID-19 today, negative strep.  Does not meet SIRS or sepsis criteria at this time.  Satting at 98 to 100% on room air.  Only appears mildly tearful and/or tachypneic when appearing anxious simultaneously.  Able to be redirected and soothed quite well.  Low suspicion for ACS or pulmonary embolism at this time.  Hemodynamically stable.   Upon re-evaluation, notable improvement with tylenol however mild remaining headache.  Neuro exam and physical exam unchanged.  Provided benadryl for additional headache relief.  Molnupiravir and decongestant sent to pharmacy.  Recommend continued conservative management from home and follow up with PCP when outside self-quarantine window.  Patient in NAD and in  good condition at time of discharge.  Disposition: After consideration the patient's encounter today, I do not feel today's workup suggests an emergent condition requiring admission or immediate intervention beyond what has been performed at this time.  Safe for discharge; instructed to return immediately for worsening symptoms, change in symptoms or any other concerns.  I have reviewed the patients home medicines and have made adjustments as needed.  Discussed course of treatment with the patient, whom demonstrated understanding.  Patient in agreement and has no further questions.    I discussed this case with my attending physician Dr. Ronnald Nian, who agreed with the proposed treatment course and cosigned this note including patient's presenting symptoms, physical exam, and planned diagnostics and interventions.  Attending physician stated agreement with plan or made changes to plan which were implemented.     This chart was dictated using voice recognition software.  Despite best efforts to proofread, errors can occur which can change the documentation meaning.         Final Clinical Impression(s) / ED Diagnoses Final diagnoses:  Acute COVID-19    Rx / DC Orders ED Discharge Orders          Ordered    molnupiravir EUA (LAGEVRIO) 200 MG CAPS capsule  2 times daily        04/21/22 1259    cetirizine (ZYRTEC ALLERGY) 10 MG tablet  Daily        04/21/22 1423              Prince Rome, Hershal Coria 40/35/24 1312    Lennice Sites, DO 04/30/22 [phone]

## 2022-04-21 NOTE — Discharge Instructions (Addendum)
You have tested positive for COVID-19 today.  An antiviral by the name of molnupiravir has been sent to the pharmacy.  Take 4 capsules every 12 hours for the next 5 days.    Continue to manage body aches, mild fevers, and discomfort with Tylenol and ibuprofen.  You may also manage congestion with a decongestant such as Zyrtec.  A prescription for this has been sent to your pharmacy.  Manage your symptoms from home over the next 5 to 7 days before you return to work.  Rest, stay hydrated, and keep up with good nutritional intake.  Continue taking your regular prescriptions as instructed.  Follow-up with your PCP in 1 week for reevaluation and continued medical management.  Return to the ED for new or worsening symptoms as discussed.

## 2022-04-21 NOTE — ED Triage Notes (Signed)
Patient reports headache, shortness of breath, cough, chills, bodyaches, and sore throat since yesterday.   No OTC meds pta.

## 2022-04-27 ENCOUNTER — Ambulatory Visit: Payer: Self-pay

## 2022-04-27 ENCOUNTER — Telehealth: Payer: Medicaid Other | Admitting: Emergency Medicine

## 2022-04-27 DIAGNOSIS — N390 Urinary tract infection, site not specified: Secondary | ICD-10-CM

## 2022-04-27 MED ORDER — CEPHALEXIN 500 MG PO CAPS: 500.0000 mg | ORAL_CAPSULE | Freq: Two times a day (BID) | ORAL | 0 refills | Status: DC

## 2022-04-27 NOTE — Telephone Encounter (Signed)
  Chief Complaint: UTI Symptoms: urinary frequency, pain with urination, pressure, urgency, low back pain Frequency: 3 days Pertinent Negatives: NA Disposition: [] ED /[] Urgent Care (no appt availability in office) / [] Appointment(In office/virtual)/ [x]  [facility]/ [] Home Care/ [] Refused Recommended Disposition /[] [facility]/ []  Follow-up with PCP Additional Notes: offered pt UC appt d/t no appts available with practice. Pt unable to go to UC today d/t having to work. Scheduled pt for virtual UC appt at 1045. Care advice given and pt verbalized understanding.  Reason for Disposition  Side (flank) or lower back pain present  Answer Assessment - Initial Assessment Questions 1. SYMPTOM: "What's the main symptom you're concerned about?" (e.g., frequency, incontinence)     Frequency, urgency, pressure 2. ONSET: "When did the  sx  start?"     3 days 3. PAIN: "Is there any pain?" If Yes, ask: "How bad is it?" (Scale: 1-10; mild, moderate, severe)     10 4. CAUSE: "What do you think is causing the symptoms?"     Possible UTI 5. OTHER SYMPTOMS: "Do you have any other symptoms?" (e.g., blood in urine, fever, flank pain, pain with urination)     Low back pain, pain with urination  Protocols used: Urinary Symptoms-A-AH

## 2022-04-27 NOTE — Progress Notes (Signed)
Virtual Visit Consent   Nicole [name], you are scheduled for a virtual visit with a [facility] provider today. Just as with appointments in the office, your consent must be obtained to participate. Your consent will be active for this visit and any virtual visit you may have with one of our providers in the next 365 days. If you have a MyChart account, a copy of this consent can be sent to you electronically.  As this is a virtual visit, video technology does not allow for your provider to perform a traditional examination. This may limit your provider's ability to fully assess your condition. If your provider identifies any concerns that need to be evaluated in person or the need to arrange testing (such as labs, EKG, etc.), we will make arrangements to do so. Although advances in technology are sophisticated, we cannot ensure that it will always work on either your end or our end. If the connection with a video visit is poor, the visit may have to be switched to a telephone visit. With either a video or telephone visit, we are not always able to ensure that we have a secure connection.  By engaging in this virtual visit, you consent to the provision of healthcare and authorize for your insurance to be billed (if applicable) for the services provided during this visit. Depending on your insurance coverage, you may receive a charge related to this service.  I need to obtain your verbal consent now. Are you willing to proceed with your visit today? [name] has provided verbal consent on 04/27/2022 for a virtual visit (video or telephone). Montine Circle, PA-C  Date: 04/27/2022 10:45 AM  Virtual Visit via Video Note   I, Montine Circle, connected with  Nicole [name]  ([id number], March 19, 1976) on 04/27/22 at 10:45 AM EDT by a video-enabled telemedicine application and verified that I am speaking with the correct person using two identifiers.  Location: Patient: Virtual Visit Location  Patient: Mobile Provider: Virtual Visit Location Provider: Home  Patient was driving. She was asked to pull over and park.  The visit was not continued until patient had parked.   I discussed the limitations of evaluation and management by telemedicine and the availability of in person appointments. The patient expressed understanding and agreed to proceed.    History of Present Illness: Nicole [name] is a 46 y.o. who identifies as a female who was assigned female at birth, and is being seen today for low back pain.  Reports that the symptoms worsen when urinating.  She reports urgency and frequency of urination.  Denies fever or vomiting.  Reports feeling slightly nauseated.  Denies history of kidney stones.  Denies any kidney or bladder problems.  HPI: HPI  Problems:  Patient Active Problem List   Diagnosis Date Noted   History of diverticulitis 02/28/2021   Antibiotic-induced yeast infection 02/28/2021   Thrombocytopenia ([place]) 02/28/2021   Hypokalemia 02/28/2021   Sigmoid diverticulitis 02/11/2021   Bacterial vaginitis 12/19/2020   Anxiety and depression 06/11/2018   Vitamin D deficiency 01/08/2018   Osteoarthritis 01/08/2018   Type 2 diabetes mellitus ([place]) 01/08/2018   Morbid obesity ([place]) 09/06/2016   Acute appendicitis 03/25/2014   Hypertension 03/25/2014    Allergies: No Known Allergies Medications:  Current Outpatient Medications:    Accu-Chek Softclix Lancets lancets, Use as instructed tid before meals, Disp: 100 each, Rfl: 12   albuterol (VENTOLIN HFA) 108 (90 Base) MCG/ACT inhaler, Inhale 2 puffs into the lungs every  6 (six) hours as needed for wheezing or shortness of breath., Disp: 18 g, Rfl: 6   atorvastatin (LIPITOR) 20 MG tablet, TAKE 1 TABLET(20 MG) BY MOUTH DAILY, Disp: 90 tablet, Rfl: 2   blood glucose meter kit and supplies KIT, Dispense based on patient and insurance preference. Use up to four times daily as directed., Disp: 1 each, Rfl: 0    budesonide-formoterol (SYMBICORT) 160-4.5 MCG/ACT inhaler, Inhale 2 puffs into the lungs daily., Disp: 1 each, Rfl: 0   cetirizine (ZYRTEC ALLERGY) 10 MG tablet, Take 1 tablet (10 mg total) by mouth daily., Disp: 30 tablet, Rfl: 0   gabapentin (NEURONTIN) 300 MG capsule, Take 1 capsule (300 mg total) by mouth at bedtime., Disp: 30 capsule, Rfl: 3   glucose blood (ACCU-CHEK GUIDE) test strip, Use as instructed, Disp: 100 each, Rfl: 12   glucose blood (FREESTYLE LITE) test strip, Use as instructed, Disp: 100 each, Rfl: 12   insulin glargine (LANTUS) 100 UNIT/ML Solostar Pen, Inject 25 Units into the skin daily., Disp: 15 mL, Rfl: 3   lidocaine (LIDODERM) 5 %, Place 1 patch onto the skin daily. Remove & Discard patch within 12 hours or as directed by MD, Disp: 30 patch, Rfl: 1   Semaglutide,0.25 or 0.5MG/DOS, (OZEMPIC, 0.25 OR 0.5 MG/DOSE,) 2 MG/1.5ML SOPN, Inject 0.25 mg into the skin once a week., Disp: 2 mL, Rfl: 3   tiotropium (SPIRIVA HANDIHALER) 18 MCG inhalation capsule, Place 1 capsule (18 mcg total) into inhaler and inhale daily., Disp: 30 capsule, Rfl: 6   topiramate (TOPAMAX) 25 MG tablet, Take 1 tablet (25 mg total) by mouth 2 (two) times daily., Disp: 60 tablet, Rfl: 3   valsartan (DIOVAN) 40 MG tablet, TAKE 1 TABLET(40 MG) BY MOUTH DAILY, Disp: 90 tablet, Rfl: 1  Observations/Objective: Patient is well-developed, well-nourished in no acute distress.  Resting comfortably at home.  Head is normocephalic, atraumatic.  No labored breathing.  Speech is clear and coherent with logical content.  Patient is alert and oriented at baseline.    Assessment and Plan: 1. Urinary tract infection without hematuria, site unspecified  -Keflex -F/u in person if worsening for UA and physical exam  Follow Up Instructions: I discussed the assessment and treatment plan with the patient. The patient was provided an opportunity to ask questions and all were answered. The patient agreed with the plan  and demonstrated an understanding of the instructions.  A copy of instructions were sent to the patient via MyChart unless otherwise noted below.     The patient was advised to call back or seek an in-person evaluation if the symptoms worsen or if the condition fails to improve as anticipated.  Time:  I spent 11 minutes with the patient via telehealth technology discussing the above problems/concerns.    Montine Circle, PA-C

## 2022-04-27 NOTE — Telephone Encounter (Signed)
Pt has had virtual visit today.

## 2022-06-11 ENCOUNTER — Telehealth: Payer: Self-pay | Admitting: Emergency Medicine

## 2022-06-11 ENCOUNTER — Ambulatory Visit: Payer: Self-pay

## 2022-06-11 NOTE — Telephone Encounter (Signed)
Pt will come in for nurse visit tomorrow for self swab

## 2022-06-11 NOTE — Telephone Encounter (Signed)
  Chief Complaint: vaginal discharge Symptoms: Vaginal discharge, odor, itching  Frequency: 1 week  Pertinent Negatives: NA Disposition: [] ED /[] Urgent Care (no appt availability in office) / [] Appointment(In office/virtual)/ []  [facility]/ [] Home Care/ [x] Refused Recommended Disposition /[] [facility]/ []  Follow-up with PCP Additional Notes: pt requesting medication for BV, states that she has had this before and is sure that is what's going on because she don't normally have an odor. Pt states that she doesn't want to come in unless there was an appt today or tomorrow. Advised no appts until 07/10/22. Pt states she is sexually active and just needs medication sent in so this can be treated. Advised would send to provider and have a nurse FU with her.   Summary: Personal   Patient would like a callback from a nurse. Patient would not elaborate on symptoms, just that it was personal.         Reason for Disposition  [1] Vaginal odor (bad smell) AND [2] not improved > 3 days following Care Advice  Answer Assessment - Initial Assessment Questions 1. SYMPTOM: "What's the main symptom you're concerned about?" (e.g., pain, itching, dryness)     Odor  2. LOCATION: "Where is the  sx located?" (e.g., inside/outside, left/right)     Vaginally 3. ONSET: "When did the  sx  start?"     1 week  4. PAIN: "Is there any pain?" If Yes, ask: "How bad is it?" (Scale: 1-10; mild, moderate, severe)   -  MILD (1-3): Doesn't interfere with normal activities.    -  MODERATE (4-7): Interferes with normal activities (e.g., work or school) or awakens from sleep.     -  SEVERE (8-10): Excruciating pain, unable to do any normal activities.     Yes  5. ITCHING: "Is there any itching?" If Yes, ask: "How bad is it?" (Scale: 1-10; mild, moderate, severe)     yes 6. CAUSE: "What do you think is causing the discharge?" "Have you had the same problem before? What happened then?"     BV 7. OTHER  SYMPTOMS: "Do you have any other symptoms?" (e.g., fever, itching, vaginal bleeding, pain with urination, injury to genital area, vaginal foreign body)     Discharge  Protocols used: Vaginal Symptoms-A-AH

## 2022-06-11 NOTE — Telephone Encounter (Signed)
Copied from CRM [id number]. Topic: General - Inquiry >> Jun 11, 2022  4:19 PM Nicole [name] wrote: PT states the office just called her a few min ago and she wants a return call, Do not see an outgoing beside NT today and she says that is not it. Wants a fu call back from provider's nurse. [phone]

## 2022-06-11 NOTE — Telephone Encounter (Addendum)
Attempt to call patient back. Scheduled apt with Dr. Laural Benes for telephone apt.

## 2022-06-12 ENCOUNTER — Other Ambulatory Visit (HOSPITAL_COMMUNITY)
Admission: RE | Admit: 2022-06-12 | Discharge: 2022-06-12 | Disposition: A | Discharge status: Home / Self Care | Payer: Medicaid Other | Source: Ambulatory Visit | Attending: Family Medicine | Admitting: Family Medicine

## 2022-06-12 ENCOUNTER — Telehealth: Payer: Medicaid Other | Admitting: Nurse Practitioner

## 2022-06-12 ENCOUNTER — Ambulatory Visit: Payer: Medicaid Other | Attending: Family Medicine

## 2022-06-12 ENCOUNTER — Telehealth: Payer: Medicaid Other | Admitting: Internal Medicine

## 2022-06-12 DIAGNOSIS — N898 Other specified noninflammatory disorders of vagina: Secondary | ICD-10-CM | POA: Diagnosis present

## 2022-06-13 ENCOUNTER — Other Ambulatory Visit: Payer: Self-pay | Admitting: Family Medicine

## 2022-06-13 LAB — CERVICOVAGINAL ANCILLARY ONLY
Bacterial Vaginitis (gardnerella): POSITIVE — AB
Candida Glabrata: NEGATIVE
Candida Vaginitis: NEGATIVE
Chlamydia: NEGATIVE
Comment: NEGATIVE
Comment: NEGATIVE
Comment: NEGATIVE
Comment: NEGATIVE
Comment: NEGATIVE
Comment: NORMAL
Neisseria Gonorrhea: NEGATIVE
Trichomonas: NEGATIVE

## 2022-06-13 MED ORDER — METRONIDAZOLE 500 MG PO TABS: 500.0000 mg | ORAL_TABLET | Freq: Two times a day (BID) | ORAL | 0 refills | Status: AC

## 2022-07-02 ENCOUNTER — Telehealth: Payer: Self-pay | Admitting: Emergency Medicine

## 2022-07-02 NOTE — Telephone Encounter (Signed)
Pt called and she states that she is still having vaginal discharge her last test was positive for BV. Does she need to come in and re test or can she get medication sent to her pharmacy.

## 2022-07-02 NOTE — Telephone Encounter (Signed)
Copied from CRM [id number]. Topic: General - Other >> Jul 02, 2022 11:42 AM Everette C wrote: Reason for CRM: The patient would like to speak with a member of office staff when possible about a "personal concern"   Please contact further when possible

## 2022-07-03 MED ORDER — METRONIDAZOLE 500 MG PO TABS: 500.0000 mg | ORAL_TABLET | Freq: Two times a day (BID) | ORAL | 0 refills | Status: AC

## 2022-07-03 NOTE — Telephone Encounter (Signed)
I have sent a prescription for metronidazole to her pharmacy.

## 2022-07-03 NOTE — Addendum Note (Signed)
Addended by: Hoy Register on: 07/03/2022 12:59 PM   Modules accepted: Orders

## 2022-07-03 NOTE — Telephone Encounter (Signed)
Pt was called and informed of medication being sent to pharmacy. 

## 2022-07-16 ENCOUNTER — Ambulatory Visit: Payer: Medicaid Other | Attending: Family Medicine | Admitting: Family Medicine

## 2022-07-16 ENCOUNTER — Encounter: Payer: Self-pay | Admitting: Family Medicine

## 2022-07-16 VITALS — BP 141/86 | HR 72 | Temp 98.1°F | Ht 68.0 in | Wt 346.4 lb

## 2022-07-16 DIAGNOSIS — N644 Mastodynia: Secondary | ICD-10-CM

## 2022-07-16 DIAGNOSIS — Z1211 Encounter for screening for malignant neoplasm of colon: Secondary | ICD-10-CM | POA: Diagnosis not present

## 2022-07-16 DIAGNOSIS — E1142 Type 2 diabetes mellitus with diabetic polyneuropathy: Secondary | ICD-10-CM | POA: Diagnosis not present

## 2022-07-16 DIAGNOSIS — N760 Acute vaginitis: Secondary | ICD-10-CM | POA: Diagnosis not present

## 2022-07-16 DIAGNOSIS — B9689 Other specified bacterial agents as the cause of diseases classified elsewhere: Secondary | ICD-10-CM

## 2022-07-16 DIAGNOSIS — R600 Localized edema: Secondary | ICD-10-CM | POA: Diagnosis not present

## 2022-07-16 DIAGNOSIS — M1711 Unilateral primary osteoarthritis, right knee: Secondary | ICD-10-CM | POA: Diagnosis not present

## 2022-07-16 DIAGNOSIS — E1159 Type 2 diabetes mellitus with other circulatory complications: Secondary | ICD-10-CM

## 2022-07-16 DIAGNOSIS — I152 Hypertension secondary to endocrine disorders: Secondary | ICD-10-CM

## 2022-07-16 LAB — POCT GLYCOSYLATED HEMOGLOBIN (HGB A1C): HbA1c, POC (controlled diabetic range): 9.9 % — AB (ref 0.0–7.0)

## 2022-07-16 LAB — GLUCOSE, POCT (MANUAL RESULT ENTRY): POC Glucose: 300 mg/dl — AB (ref 70–99)

## 2022-07-16 MED ORDER — MELOXICAM 7.5 MG PO TABS: 7.5000 mg | ORAL_TABLET | Freq: Every day | ORAL | 1 refills | Status: DC

## 2022-07-16 MED ORDER — METRONIDAZOLE 500 MG PO TABS: ORAL_TABLET | ORAL | 6 refills | Status: DC

## 2022-07-16 MED ORDER — HYDROCHLOROTHIAZIDE 25 MG PO TABS: 25.0000 mg | ORAL_TABLET | Freq: Every day | ORAL | 1 refills | Status: DC

## 2022-07-16 MED ORDER — FLUCONAZOLE 150 MG PO TABS: ORAL_TABLET | ORAL | 6 refills | Status: DC

## 2022-07-16 MED ORDER — INSULIN GLARGINE 100 UNIT/ML SOLOSTAR PEN: 30.0000 [IU] | PEN_INJECTOR | Freq: Every day | SUBCUTANEOUS | 3 refills | Status: DC

## 2022-07-16 MED ORDER — OZEMPIC (0.25 OR 0.5 MG/DOSE) 2 MG/1.5ML ~~LOC~~ SOPN: 0.2500 mg | PEN_INJECTOR | SUBCUTANEOUS | 3 refills | Status: DC

## 2022-07-16 NOTE — Patient Instructions (Signed)
Edema ? ?Edema is when you have too much fluid in your body or under your skin. Edema may make your legs, feet, and ankles swell. Swelling often happens in looser tissues, such as around your eyes. This is a common condition. It gets more common as you get older. ?There are many possible causes of edema. These include: ?Eating too much salt (sodium). ?Being on your feet or sitting for a long time. ?Certain medical conditions, such as: ?Pregnancy. ?Heart failure. ?Liver disease. ?Kidney disease. ?Cancer. ?Hot weather may make edema worse. Edema is usually painless. Your skin may look swollen or shiny. ?Follow these instructions at home: ?Medicines ?Take over-the-counter and prescription medicines only as told by your doctor. ?Your doctor may prescribe a medicine to help your body get rid of extra water (diuretic). Take this medicine if you are told to take it. ?Eating and drinking ?Eat a low-salt (low-sodium) diet as told by your doctor. Sometimes, eating less salt may reduce swelling. ?Depending on the cause of your swelling, you may need to limit how much fluid you drink (fluid restriction). ?General instructions ?Raise the injured area above the level of your heart while you are sitting or lying down. ?Do not sit still or stand for a long time. ?Do not wear tight clothes. Do not wear garters on your upper legs. ?Exercise your legs. This can help the swelling go down. ?Wear compression stockings as told by your doctor. It is important that these are the right size. These should be prescribed by your doctor to prevent possible injuries. ?If elastic bandages or wraps are recommended, use them as told by your doctor. ?Contact a doctor if: ?Treatment is not working. ?You have heart, liver, or kidney disease and have symptoms of edema. ?You have sudden and unexplained weight gain. ?Get help right away if: ?You have shortness of breath or chest pain. ?You cannot breathe when you lie down. ?You have pain, redness, or  warmth in the swollen areas. ?You have heart, liver, or kidney disease and get edema all of a sudden. ?You have a fever and your symptoms get worse all of a sudden. ?These symptoms may be an emergency. Get help right away. Call 911. ?Do not wait to see if the symptoms will go away. ?Do not drive yourself to the hospital. ?Summary ?Edema is when you have too much fluid in your body or under your skin. ?Edema may make your legs, feet, and ankles swell. Swelling often happens in looser tissues, such as around your eyes. ?Raise the injured area above the level of your heart while you are sitting or lying down. ?Follow your doctor's instructions about diet and how much fluid you can drink. ?This information is not intended to replace advice given to you by your health care provider. Make sure you discuss any questions you have with your health care provider. ?Document Revised: 03/20/2021 Document Reviewed: 03/20/2021 ?Elsevier Patient Education ? 2023 [employer] ? ?

## 2022-07-16 NOTE — Progress Notes (Signed)
Subjective:  Patient ID: Nicole [name], female    DOB: 1976-01-20  Age: 46 y.o. MRN: [id number]  CC: Diabetes   HPI Nicole [name] is a 46 y.o. year old female with a history of morbid obesity, Type 2 DM (A1c 9.9), diabetic neuropathy osteoarthritis of the knee, tobacco abuse here for follow-up of chronic medical conditions   Interval History: Complains 4 days ago of feeling a knot in her left breast and subsequently felt pain.  Now she is unable to feel the knot but still has pain. I had placed mammogram order in the past but she never followed through with this.  Today she tells me she never followed through because of fear. She has not had a cycle in a while.  Also complains of pain in her right knee which hurts constantly worse with going down the stairs and walking up hills. Currently does not take any analgesic.  She has noticed pedal edema.which previously resolved but now is persistent. 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 She states she now wakes up with a swollen ankle and she also has left ankle pain as well. She has also noticed new dyspnea on exertion. Symptoms  have been present x2-3 weeks. Denies presence of weight gain.  A1c is 9.9 down from 11.2 previously and she endorses adherence with her Ozempic, Lantus. Ozempic makes her stomach 'tight' and for 2 days she is in bed due to nausea. She placed a hold on taking her Ozempic due to this but plans to restart it. Her Neuropathy is controlled and she no longer needs gabapentin. Blood pressure is elevated and she is adherent with her antihypertensive. She has had recurrent bacteria vaginosis infections and would like to be placed on surpressive therapy.  Past Medical History:  Diagnosis Date   Arthritis    knees   Diabetes mellitus without complication ([place])    Hyperlipidemia    Hypertension    Ovarian cyst    Pregnancy induced hypertension     Past Surgical History:  Procedure Laterality Date    APPENDECTOMY     CESAREAN SECTION     HERNIA REPAIR     LAPAROSCOPIC APPENDECTOMY N/A 03/25/2014   Procedure: APPENDECTOMY LAPAROSCOPIC;  Surgeon: Gwenyth Ober, MD;  Location: [facility];  Service: General;  Laterality: N/A;    Family History  Problem Relation Age of Onset   Hypertension Mother    Cancer Mother        pancreatic cancer   Arthritis Father    Heart disease Maternal Grandmother     Social History   Socioeconomic History   Marital status: Single    Spouse name: Not on file   Number of children: 2   Years of education: Not on file   Highest education level: Not on file  Occupational History   Not on file  Tobacco Use   Smoking status: Former    Packs/day: 0.50    Years: 27.00    Total pack years: 13.50    Types: Cigarettes    Quit date: 10/28/2020    Years since quitting: 1.7   Smokeless tobacco: Never   Tobacco comments:    6 cigs daily  Vaping Use   Vaping Use: Never used  Substance and Sexual Activity   Alcohol use: Not Currently    Comment: occ   Drug use: Yes    Frequency: 7.0 times per week    Types: Marijuana  Sexual activity: Not Currently    Comment: 5 years  Other Topics Concern   Not on file  Social History Narrative   Not on file   Social Determinants of Health   Financial Resource Strain: Not on file  Food Insecurity: No Food Insecurity (05/24/2020)   Hunger Vital Sign    Worried About Running Out of Food in the Last Year: Never true    Ran Out of Food in the Last Year: Never true  Transportation Needs: No Transportation Needs (05/24/2020)   PRAPARE - Hydrologist (Medical): No    Lack of Transportation (Non-Medical): No  Physical Activity: Not on file  Stress: Not on file  Social Connections: Not on file    No Known Allergies  Outpatient Medications Prior to Visit  Medication Sig Dispense Refill   Accu-Chek Softclix Lancets lancets Use as instructed tid before meals 100 each 12   albuterol  (VENTOLIN HFA) 108 (90 Base) MCG/ACT inhaler Inhale 2 puffs into the lungs every 6 (six) hours as needed for wheezing or shortness of breath. 18 g 6   atorvastatin (LIPITOR) 20 MG tablet TAKE 1 TABLET(20 MG) BY MOUTH DAILY 90 tablet 2   blood glucose meter kit and supplies KIT Dispense based on patient and insurance preference. Use up to four times daily as directed. 1 each 0   budesonide-formoterol (SYMBICORT) 160-4.5 MCG/ACT inhaler Inhale 2 puffs into the lungs daily. 1 each 0   glucose blood (ACCU-CHEK GUIDE) test strip Use as instructed 100 each 12   glucose blood (FREESTYLE LITE) test strip Use as instructed 100 each 12   lidocaine (LIDODERM) 5 % Place 1 patch onto the skin daily. Remove & Discard patch within 12 hours or as directed by MD 30 patch 1   tiotropium (SPIRIVA HANDIHALER) 18 MCG inhalation capsule Place 1 capsule (18 mcg total) into inhaler and inhale daily. 30 capsule 6   topiramate (TOPAMAX) 25 MG tablet Take 1 tablet (25 mg total) by mouth 2 (two) times daily. 60 tablet 3   valsartan (DIOVAN) 40 MG tablet TAKE 1 TABLET(40 MG) BY MOUTH DAILY 90 tablet 1   insulin glargine (LANTUS) 100 UNIT/ML Solostar Pen Inject 25 Units into the skin daily. 15 mL 3   Semaglutide,0.25 or 0.5MG/DOS, (OZEMPIC, 0.25 OR 0.5 MG/DOSE,) 2 MG/1.5ML SOPN Inject 0.25 mg into the skin once a week. 2 mL 3   cephALEXin (KEFLEX) 500 MG capsule Take 1 capsule (500 mg total) by mouth 2 (two) times daily. 14 capsule 0   gabapentin (NEURONTIN) 300 MG capsule Take 1 capsule (300 mg total) by mouth at bedtime. (Patient not taking: Reported on 07/16/2022) 30 capsule 3   cetirizine (ZYRTEC ALLERGY) 10 MG tablet Take 1 tablet (10 mg total) by mouth daily. (Patient not taking: Reported on 07/16/2022) 30 tablet 0   No facility-administered medications prior to visit.     ROS Review of Systems  Constitutional:  Negative for activity change and appetite change.  HENT:  Negative for sinus pressure and sore throat.    Respiratory:  Negative for chest tightness, shortness of breath and wheezing.   Cardiovascular:  Positive for leg swelling. Negative for chest pain and palpitations.  Gastrointestinal:  Negative for abdominal distention, abdominal pain and constipation.  Genitourinary: Negative.   Musculoskeletal:        See HPI  Psychiatric/Behavioral:  Negative for behavioral problems and dysphoric mood.     Objective:  BP (!) 141/86   Pulse  72   Temp 98.1 F (36.7 C) (Oral)   Ht _0  (1.727 m)   Wt (!) 346 lb 6.4 oz (157.1 kg)   SpO2 99%   BMI 52.67 kg/m      07/16/2022   10:27 AM 04/21/2022    2:30 PM 04/21/2022    1:54 PM  BP/Weight  Systolic BP 737 106 269  Diastolic BP 86 95 99  Wt. (Lbs) 346.4    BMI 52.67 kg/m2      Wt Readings from Last 3 Encounters:  07/16/22 (!) 346 lb 6.4 oz (157.1 kg)  04/03/22 (!) 339 lb (153.8 kg)  01/29/22 (!) 347 lb 9.6 oz (157.7 kg)     Physical Exam Constitutional:      Appearance: She is well-developed. She is obese.  Cardiovascular:     Rate and Rhythm: Normal rate.     Heart sounds: Normal heart sounds. No murmur heard. Pulmonary:     Effort: Pulmonary effort is normal.     Breath sounds: Normal breath sounds. No wheezing or rales.  Chest:     Chest wall: No tenderness.  Breasts:    Right: No mass or tenderness.     Left: Tenderness (8 o'clock) present. No mass.  Abdominal:     General: Bowel sounds are normal. There is no distension.     Palpations: Abdomen is soft. There is no mass.     Tenderness: There is no abdominal tenderness.  Musculoskeletal:        General: Normal range of motion.     Right lower leg: No edema.     Left lower leg: No edema.  Neurological:     Mental Status: She is alert and oriented to person, place, and time.  Psychiatric:        Mood and Affect: Mood normal.        Latest Ref Rng & Units 04/21/2022    1:13 PM 11/23/2021   12:23 PM 09/06/2021    2:11 AM  CMP  Glucose 70 - 99 mg/dL 168  316  293    BUN 6 - 20 mg/dL _1 Creatinine 0.44 - 1.00 mg/dL 0.97  1.09  1.39   Sodium 135 - 145 mmol/L 135  139  138   Potassium 3.5 - 5.1 mmol/L 3.9  4.9  4.2   Chloride 98 - 111 mmol/L 100  101  103   CO2 22 - 32 mmol/L _2 Calcium 8.9 - 10.3 mg/dL 9.4  9.5  9.4   Total Protein 6.0 - 8.5 g/dL  6.7    Total Bilirubin 0.0 - 1.2 mg/dL  0.3    Alkaline Phos 44 - 121 IU/L  146    AST 0 - 40 IU/L  28    ALT 0 - 32 IU/L  33      Lipid Panel     Component Value Date/Time   CHOL 199 11/23/2021 1223   TRIG 161 (H) 11/23/2021 1223   HDL 53 11/23/2021 1223   CHOLHDL 3.8 11/23/2021 1223   CHOLHDL 5.1 02/13/2021 0932   VLDL 26 02/13/2021 0932   LDLCALC 118 (H) 11/23/2021 1223    CBC    Component Value Date/Time   WBC 7.2 04/21/2022 1313   RBC 4.82 04/21/2022 1313   HGB 12.7 04/21/2022 1313   HGB 12.6 02/28/2021 1629   HCT 39.3 04/21/2022 1313   HCT 38.4 02/28/2021 1629   PLT  197 04/21/2022 1313   PLT 265 02/28/2021 1629   MCV 81.5 04/21/2022 1313   MCV 82 02/28/2021 1629   MCH 26.3 04/21/2022 1313   MCHC 32.3 04/21/2022 1313   RDW 13.9 04/21/2022 1313   RDW 13.4 02/28/2021 1629   LYMPHSABS 0.4 (L) 04/21/2022 1313   LYMPHSABS 2.8 02/28/2021 1629   MONOABS 1.0 04/21/2022 1313   EOSABS 0.1 04/21/2022 1313   EOSABS 0.2 02/28/2021 1629   BASOSABS 0.0 04/21/2022 1313   BASOSABS 0.1 02/28/2021 1629    Lab Results  Component Value Date   HGBA1C 9.9 (A) 07/16/2022    Assessment & Plan:  1. Type 2 diabetes mellitus with peripheral neuropathy (HCC) Uncontrolled with A1C of 9.9 Due to intolerance of GLP-1 RA I will hold off on increasing the dose.  She would like to remain on Ozempic Increase Lantus dose from 25 units to 30 units Counseled on Diabetic diet, my plate method, 751 minutes of moderate intensity exercise/week Blood sugar logs with fasting goals of 80-120 mg/dl, random of less than 180 and in the event of sugars less than 60 mg/dl or greater than 400 mg/dl  encouraged to notify the clinic. Advised on the need for annual eye exams, annual foot exams, Pneumonia vaccine. - POCT glucose (manual entry) - POCT glycosylated hemoglobin (Hb A1C) - insulin glargine (LANTUS) 100 UNIT/ML Solostar Pen; Inject 30 Units into the skin daily.  Dispense: 15 mL; Refill: 3 - Semaglutide,0.25 or 0.5MG/DOS, (OZEMPIC, 0.25 OR 0.5 MG/DOSE,) 2 MG/1.5ML SOPN; Inject 0.25 mg into the skin once a week.  Dispense: 2 mL; Refill: 3  2. Screening for colon cancer - Ambulatory referral to Gastroenterology  3. Hypertension associated with type 2 diabetes mellitus ([place]) Slightly above goal Will add on HCTZ especially since she can also complains of pedal edema - hydrochlorothiazide (HYDRODIURIL) 25 MG tablet; Take 1 tablet (25 mg total) by mouth daily.  Dispense: 90 tablet; Refill: 1 - CMP14+EGFR  4. Pedal edema Likely dependent edema in the setting of prolonged standing as a hairdresser Will send of BNP to evaluate for possible cardiac etiology Venous insufficiency is also possible given her weight Encouraged to comply with a low-sodium diet, elevate feet, use compression stockings - Pro b natriuretic peptide - hydrochlorothiazide (HYDRODIURIL) 25 MG tablet; Take 1 tablet (25 mg total) by mouth daily.  Dispense: 90 tablet; Refill: 1  5. Mastodynia of left breast Tenderness on exam but no palpable masses - US BREAST LTD UNI LEFT INC AXILLA; Future - MM DIAG BREAST TOMO BILATERAL; Future  6. Bacterial vaginosis Discussed recommendations for prophylaxis according to guidelines She is not open to using the MetroGel twice a week for 80-monthReviewed CDC recommendations which also include prescription of 3 g metronidazole p.o. once a month along with 1 tablet of Diflucan and she is agreeable to this regimen Counseled on sexual practices including use of condom, avoid douching - metroNIDAZOLE (FLAGYL) 500 MG tablet; Currently once a month long with a Diflucan pill for  bacterial vaginosis prevention.  Dispense: 4 tablet; Refill: 6 - fluconazole (DIFLUCAN) 150 MG tablet; Orally once a month along with metronidazole for BV prevention.  Dispense: 1 tablet; Refill: 6  7. Primary osteoarthritis of right knee Uncontrolled Weight loss will be beneficial Will place an NSAID If symptoms persist consider Ortho referral - meloxicam (MOBIC) 7.5 MG tablet; Take 1 tablet (7.5 mg total) by mouth daily.  Dispense: 30 tablet; Refill: 1   Meds ordered this encounter  Medications  insulin glargine (LANTUS) 100 UNIT/ML Solostar Pen    Sig: Inject 30 Units into the skin daily.    Dispense:  15 mL    Refill:  3    Dose increase   hydrochlorothiazide (HYDRODIURIL) 25 MG tablet    Sig: Take 1 tablet (25 mg total) by mouth daily.    Dispense:  90 tablet    Refill:  1   Semaglutide,0.25 or 0.5MG/DOS, (OZEMPIC, 0.25 OR 0.5 MG/DOSE,) 2 MG/1.5ML SOPN    Sig: Inject 0.25 mg into the skin once a week.    Dispense:  2 mL    Refill:  3   metroNIDAZOLE (FLAGYL) 500 MG tablet    Sig: Currently once a month long with a Diflucan pill for bacterial vaginosis prevention.    Dispense:  4 tablet    Refill:  6   fluconazole (DIFLUCAN) 150 MG tablet    Sig: Orally once a month along with metronidazole for BV prevention.    Dispense:  1 tablet    Refill:  6   meloxicam (MOBIC) 7.5 MG tablet    Sig: Take 1 tablet (7.5 mg total) by mouth daily.    Dispense:  30 tablet    Refill:  1    Follow-up: Return in about 3 months (around 10/15/2022) for Chronic medical conditions.       [name], MD, FAAFP. [facility] [place], [place]   07/16/2022, 1:09 PM

## 2022-07-16 NOTE — Progress Notes (Signed)
Swelling in feet Knee pain Knot in left breast.

## 2022-07-17 LAB — CMP14+EGFR
ALT: 25 IU/L (ref 0–32)
AST: 20 IU/L (ref 0–40)
Albumin/Globulin Ratio: 1.3 (ref 1.2–2.2)
Albumin: 3.9 g/dL (ref 3.9–4.9)
Alkaline Phosphatase: 140 IU/L — ABNORMAL HIGH (ref 44–121)
BUN/Creatinine Ratio: 17 (ref 9–23)
BUN: 14 mg/dL (ref 6–24)
Bilirubin Total: <0.2 mg/dL (ref 0.0–1.2)
CO2: 23 mmol/L (ref 20–29)
Calcium: 9.5 mg/dL (ref 8.7–10.2)
Chloride: 99 mmol/L (ref 96–106)
Creatinine, Ser: 0.84 mg/dL (ref 0.57–1.00)
Globulin, Total: 3 g/dL (ref 1.5–4.5)
Glucose: 257 mg/dL — ABNORMAL HIGH (ref 70–99)
Potassium: 5 mmol/L (ref 3.5–5.2)
Sodium: 135 mmol/L (ref 134–144)
Total Protein: 6.9 g/dL (ref 6.0–8.5)
eGFR: 86 mL/min/{1.73_m2} (ref >59)

## 2022-07-17 LAB — PRO B NATRIURETIC PEPTIDE: NT-Pro BNP: 115 pg/mL (ref 0–249)

## 2022-08-20 ENCOUNTER — Telehealth: Payer: Self-pay

## 2022-08-20 NOTE — Telephone Encounter (Signed)
Precharting on this patient BMI > 50. I have added Dr. Candis Schatz into this message. I am not sure if he would like an OV or direct to the hospital. Depending on procedure date at hospital patient may need a new PV as well. PV currently scheduled for 09-03-22.  Tyeesha Riker, PV

## 2022-08-21 NOTE — Telephone Encounter (Signed)
Nicole [name] will you add this pt to the hospital list please. See note below.

## 2022-08-24 ENCOUNTER — Telehealth: Payer: Self-pay | Admitting: *Deleted

## 2022-08-24 NOTE — Telephone Encounter (Signed)
[name],  This pt's BMI is greater than 50; their procedure will need to be performed at the hospital.  Thanks,  Phylisha Dix 

## 2022-08-24 NOTE — Telephone Encounter (Signed)
Call to pt to let her know due to her BMI, for her safety, her colonoscopy needs to be sched at the hospital, explained to pt the reasons for this and pt verb understanding. Let pt know the nurse from dr cunningham's office will be calling her to sched the procedure and the previsit at the hosp. Pt verb understanding.

## 2022-08-28 NOTE — Telephone Encounter (Signed)
Appointment has been cancelled with patient. Will schedule a New Patient Office visit first.

## 2022-09-24 ENCOUNTER — Encounter: Payer: Medicaid Other | Admitting: Gastroenterology

## 2022-10-01 ENCOUNTER — Other Ambulatory Visit: Payer: Medicaid Other

## 2022-10-08 ENCOUNTER — Ambulatory Visit: Payer: Medicaid Other | Attending: Family Medicine | Admitting: Family Medicine

## 2022-10-08 ENCOUNTER — Ambulatory Visit
Admission: RE | Admit: 2022-10-08 | Discharge: 2022-10-08 | Disposition: A | Discharge status: Home / Self Care | Payer: Medicaid Other | Source: Ambulatory Visit | Attending: Family Medicine | Admitting: Family Medicine

## 2022-10-08 ENCOUNTER — Encounter: Payer: Self-pay | Admitting: Family Medicine

## 2022-10-08 VITALS — BP 163/101 | HR 76 | Temp 98.3°F | Ht 68.0 in | Wt 347.0 lb

## 2022-10-08 DIAGNOSIS — I152 Hypertension secondary to endocrine disorders: Secondary | ICD-10-CM | POA: Diagnosis not present

## 2022-10-08 DIAGNOSIS — Z6841 Body Mass Index (BMI) 40.0 and over, adult: Secondary | ICD-10-CM | POA: Diagnosis not present

## 2022-10-08 DIAGNOSIS — E1159 Type 2 diabetes mellitus with other circulatory complications: Secondary | ICD-10-CM | POA: Diagnosis not present

## 2022-10-08 DIAGNOSIS — M79671 Pain in right foot: Secondary | ICD-10-CM | POA: Diagnosis not present

## 2022-10-08 DIAGNOSIS — Z794 Long term (current) use of insulin: Secondary | ICD-10-CM | POA: Diagnosis not present

## 2022-10-08 DIAGNOSIS — E669 Obesity, unspecified: Secondary | ICD-10-CM | POA: Diagnosis not present

## 2022-10-08 DIAGNOSIS — E1142 Type 2 diabetes mellitus with diabetic polyneuropathy: Secondary | ICD-10-CM | POA: Diagnosis not present

## 2022-10-08 DIAGNOSIS — E1169 Type 2 diabetes mellitus with other specified complication: Secondary | ICD-10-CM

## 2022-10-08 DIAGNOSIS — Z7985 Long-term (current) use of injectable non-insulin antidiabetic drugs: Secondary | ICD-10-CM

## 2022-10-08 DIAGNOSIS — I890 Lymphedema, not elsewhere classified: Secondary | ICD-10-CM

## 2022-10-08 DIAGNOSIS — M79674 Pain in right toe(s): Secondary | ICD-10-CM | POA: Diagnosis not present

## 2022-10-08 DIAGNOSIS — E785 Hyperlipidemia, unspecified: Secondary | ICD-10-CM | POA: Diagnosis not present

## 2022-10-08 LAB — GLUCOSE, POCT (MANUAL RESULT ENTRY): POC Glucose: 337 mg/dl — AB (ref 70–99)

## 2022-10-08 LAB — POCT GLYCOSYLATED HEMOGLOBIN (HGB A1C): HbA1c, POC (controlled diabetic range): 12.3 % — AB (ref 0.0–7.0)

## 2022-10-08 MED ORDER — INSULIN GLARGINE 100 UNIT/ML SOLOSTAR PEN: 30.0000 [IU] | PEN_INJECTOR | Freq: Every day | SUBCUTANEOUS | 3 refills | Status: DC

## 2022-10-08 MED ORDER — ATORVASTATIN CALCIUM 20 MG PO TABS: ORAL_TABLET | ORAL | 1 refills | Status: DC

## 2022-10-08 MED ORDER — TIRZEPATIDE 15 MG/0.5ML ~~LOC~~ SOAJ: 15.0000 mg | SUBCUTANEOUS | 6 refills | Status: DC

## 2022-10-08 MED ORDER — TIRZEPATIDE 12.5 MG/0.5ML ~~LOC~~ SOAJ: 12.5000 mg | SUBCUTANEOUS | 0 refills | Status: DC

## 2022-10-08 MED ORDER — GABAPENTIN 300 MG PO CAPS: 300.0000 mg | ORAL_CAPSULE | Freq: Every day | ORAL | 3 refills | Status: DC

## 2022-10-08 MED ORDER — VALSARTAN 80 MG PO TABS: 80.0000 mg | ORAL_TABLET | Freq: Every day | ORAL | 1 refills | Status: DC

## 2022-10-08 NOTE — Progress Notes (Signed)
Pain in toes on right foot.

## 2022-10-08 NOTE — Progress Notes (Signed)
Subjective:  Patient ID: Nicole [name], female    DOB: 1975/10/12  Age: 47 y.o. MRN: [id number]  CC: Diabetes   HPI Nicole [name] is a 47 y.o. year old female with a history of morbid obesity, Type 2 DM (A1c 9.9), diabetic neuropathy osteoarthritis of the knee, tobacco abuse here for follow-up of chronic medical conditions    Interval History:  For the last 1 month she has noticed pain on the plantar aspect of the toes of her right foot on standing and right foot dorsal edema  but none on the left.. 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 She has associated numbness and burning and she has to place pressure on the left foot.  Gabapentin and meloxicam appear on her med list but she has not been taking the gabapentin.  She has been using Advil which has been beneficial. Her blood pressure is elevated and she has noticed some headaches which have been relieved on taking Advil.  Endorses adherence with her antihypertensive. She does have hydrochlorothiazide on her med list but has not been taking it because of the fact the diuretic effect will interfere with her work.  Her A1c is 12.3 up from 9.9.  She has been on 23 units of Lantus rather than 30 units on her med list.  She has been on Ozempic 2 mg and states every time she takes it she does have nausea for the first 2 days but is good after that.  She does not check her blood sugars at home. Past Medical History:  Diagnosis Date   Arthritis    knees   Diabetes mellitus without complication ([place])    Hyperlipidemia    Hypertension    Ovarian cyst    Pregnancy induced hypertension    Type 2 diabetes mellitus ([place]) 01/08/2018    Past Surgical History:  Procedure Laterality Date   APPENDECTOMY     CESAREAN SECTION     HERNIA REPAIR     LAPAROSCOPIC APPENDECTOMY N/A 03/25/2014   Procedure: APPENDECTOMY LAPAROSCOPIC;  Surgeon: Gwenyth Ober, MD;  Location: [place];  Service: General;  Laterality: N/A;    Family History  Problem  Relation Age of Onset   Hypertension Mother    Cancer Mother        pancreatic cancer   Arthritis Father    Heart disease Maternal Grandmother     Social History   Socioeconomic History   Marital status: Single    Spouse name: Not on file   Number of children: 2   Years of education: Not on file   Highest education level: Not on file  Occupational History   Not on file  Tobacco Use   Smoking status: Former    Packs/day: 0.50    Years: 27.00    Total pack years: 13.50    Types: Cigarettes    Quit date: 10/28/2020    Years since quitting: 1.9   Smokeless tobacco: Never   Tobacco comments:    6 cigs daily  Vaping Use   Vaping Use: Never used  Substance and Sexual Activity   Alcohol use: Not Currently    Comment: occ   Drug use: Yes    Frequency: 7.0 times per week    Types: Marijuana   Sexual activity: Not Currently    Comment: 5 years  Other Topics Concern   Not on file  Social History Narrative   Not on file   Social Determinants  of Health   Financial Resource Strain: Not on file  Food Insecurity: No Food Insecurity (05/24/2020)   Hunger Vital Sign    Worried About Running Out of Food in the Last Year: Never true    Ran Out of Food in the Last Year: Never true  Transportation Needs: No Transportation Needs (05/24/2020)   PRAPARE - Hydrologist (Medical): No    Lack of Transportation (Non-Medical): No  Physical Activity: Not on file  Stress: Not on file  Social Connections: Not on file    No Known Allergies  Outpatient Medications Prior to Visit  Medication Sig Dispense Refill   Accu-Chek Softclix Lancets lancets Use as instructed tid before meals 100 each 12   albuterol (VENTOLIN HFA) 108 (90 Base) MCG/ACT inhaler Inhale 2 puffs into the lungs every 6 (six) hours as needed for wheezing or shortness of breath. 18 g 6   blood glucose meter kit and supplies KIT Dispense based on patient and insurance preference. Use up to four  times daily as directed. 1 each 0   budesonide-formoterol (SYMBICORT) 160-4.5 MCG/ACT inhaler Inhale 2 puffs into the lungs daily. 1 each 0   glucose blood (ACCU-CHEK GUIDE) test strip Use as instructed 100 each 12   glucose blood (FREESTYLE LITE) test strip Use as instructed 100 each 12   hydrochlorothiazide (HYDRODIURIL) 25 MG tablet Take 1 tablet (25 mg total) by mouth daily. 90 tablet 1   lidocaine (LIDODERM) 5 % Place 1 patch onto the skin daily. Remove & Discard patch within 12 hours or as directed by MD 30 patch 1   meloxicam (MOBIC) 7.5 MG tablet Take 1 tablet (7.5 mg total) by mouth daily. 30 tablet 1   tiotropium (SPIRIVA HANDIHALER) 18 MCG inhalation capsule Place 1 capsule (18 mcg total) into inhaler and inhale daily. 30 capsule 6   topiramate (TOPAMAX) 25 MG tablet Take 1 tablet (25 mg total) by mouth 2 (two) times daily. 60 tablet 3   atorvastatin (LIPITOR) 20 MG tablet TAKE 1 TABLET(20 MG) BY MOUTH DAILY 90 tablet 2   gabapentin (NEURONTIN) 300 MG capsule Take 1 capsule (300 mg total) by mouth at bedtime. 30 capsule 3   insulin glargine (LANTUS) 100 UNIT/ML Solostar Pen Inject 30 Units into the skin daily. 15 mL 3   OZEMPIC, 0.25 OR 0.5 MG/DOSE, 2 MG/3ML SOPN SMARTSIG:0.25 Milligram(s) SUB-Q Once a Week     Semaglutide,0.25 or 0.'5MG'$ /DOS, (OZEMPIC, 0.25 OR 0.5 MG/DOSE,) 2 MG/1.5ML SOPN Inject 0.25 mg into the skin once a week. 2 mL 3   valsartan (DIOVAN) 40 MG tablet TAKE 1 TABLET(40 MG) BY MOUTH DAILY 90 tablet 1   cephALEXin (KEFLEX) 500 MG capsule Take 1 capsule (500 mg total) by mouth 2 (two) times daily. (Patient not taking: Reported on 10/08/2022) 14 capsule 0   fluconazole (DIFLUCAN) 150 MG tablet Orally once a month along with metronidazole for BV prevention. (Patient not taking: Reported on 10/08/2022) 1 tablet 6   metroNIDAZOLE (FLAGYL) 500 MG tablet Currently once a month long with a Diflucan pill for bacterial vaginosis prevention. (Patient not taking: Reported on 10/08/2022)  4 tablet 6   No facility-administered medications prior to visit.     ROS Review of Systems  Constitutional:  Negative for activity change and appetite change.  HENT:  Negative for sinus pressure and sore throat.   Respiratory:  Negative for chest tightness, shortness of breath and wheezing.   Cardiovascular:  Positive for leg swelling. Negative  for chest pain and palpitations.  Gastrointestinal:  Negative for abdominal distention, abdominal pain and constipation.  Genitourinary: Negative.   Musculoskeletal:        See HPI  Psychiatric/Behavioral:  Negative for behavioral problems and dysphoric mood.     Objective:  BP (!) 163/101   Pulse 76   Temp 98.3 F (36.8 C) (Oral)   Ht '5\' 8"'$  (1.727 m)   Wt (!) 347 lb (157.4 kg)   SpO2 100%   BMI 52.76 kg/m      10/08/2022   12:14 PM 10/08/2022   11:13 AM 07/16/2022   10:27 AM  BP/Weight  Systolic BP [id number]  Diastolic BP 99991111 0000000 86  Wt. (Lbs)  347 346.4  BMI  52.76 kg/m2 52.67 kg/m2      Physical Exam Constitutional:      Appearance: She is well-developed.  Cardiovascular:     Rate and Rhythm: Normal rate.     Heart sounds: Normal heart sounds. No murmur heard. Pulmonary:     Effort: Pulmonary effort is normal.     Breath sounds: Normal breath sounds. No wheezing or rales.  Chest:     Chest wall: No tenderness.  Abdominal:     General: Bowel sounds are normal. There is no distension.     Palpations: Abdomen is soft. There is no mass.     Tenderness: There is no abdominal tenderness.  Musculoskeletal:     Right lower leg: Edema (non pitting edema dorsum of right foot, right ankle and right leg) present.     Left lower leg: No edema.     Comments: Tenderness on palpation of bases of metatarsal heads of right foot  Neurological:     Mental Status: She is alert and oriented to person, place, and time.  Psychiatric:        Mood and Affect: Mood normal.        Latest Ref Rng & Units 07/16/2022   11:30 AM  04/21/2022    1:13 PM 11/23/2021   12:23 PM  CMP  Glucose 70 - 99 mg/dL 257  168  316   BUN 6 - 24 mg/dL '14  11  13   '$ Creatinine 0.57 - 1.00 mg/dL 0.84  0.97  1.09   Sodium 134 - 144 mmol/L 135  135  139   Potassium 3.5 - 5.2 mmol/L 5.0  3.9  4.9   Chloride 96 - 106 mmol/L 99  100  101   CO2 20 - 29 mmol/L '23  25  26   '$ Calcium 8.7 - 10.2 mg/dL 9.5  9.4  9.5   Total Protein 6.0 - 8.5 g/dL 6.9   6.7   Total Bilirubin 0.0 - 1.2 mg/dL <0.2   0.3   Alkaline Phos 44 - 121 IU/L 140   146   AST 0 - 40 IU/L 20   28   ALT 0 - 32 IU/L 25   33     Lipid Panel     Component Value Date/Time   CHOL 199 11/23/2021 1223   TRIG 161 (H) 11/23/2021 1223   HDL 53 11/23/2021 1223   CHOLHDL 3.8 11/23/2021 1223   CHOLHDL 5.1 02/13/2021 0932   VLDL 26 02/13/2021 0932   LDLCALC 118 (H) 11/23/2021 1223    CBC    Component Value Date/Time   WBC 7.2 04/21/2022 1313   RBC 4.82 04/21/2022 1313   HGB 12.7 04/21/2022 1313   HGB 12.6 02/28/2021 1629  HCT 39.3 04/21/2022 1313   HCT 38.4 02/28/2021 1629   PLT 197 04/21/2022 1313   PLT 265 02/28/2021 1629   MCV 81.5 04/21/2022 1313   MCV 82 02/28/2021 1629   MCH 26.3 04/21/2022 1313   MCHC 32.3 04/21/2022 1313   RDW 13.9 04/21/2022 1313   RDW 13.4 02/28/2021 1629   LYMPHSABS 0.4 (L) 04/21/2022 1313   LYMPHSABS 2.8 02/28/2021 1629   MONOABS 1.0 04/21/2022 1313   EOSABS 0.1 04/21/2022 1313   EOSABS 0.2 02/28/2021 1629   BASOSABS 0.0 04/21/2022 1313   BASOSABS 0.1 02/28/2021 1629    Lab Results  Component Value Date   HGBA1C 12.3 (A) 10/08/2022    Assessment & Plan:  1. Type 2 diabetes mellitus with peripheral neuropathy (HCC) Uncontrolled with A1c of 12.3 up from 9.9 previously She has been administering 22 units rather than 30 units of Lantus Advised on correct dose of 30 units of Lantus and she will follow-up with the clinical pharmacist for up titration Ozempic substituted with Mounjaro 12.5 mg for additional weight benefit and she will  titrate up to 15 units after 4 weeks Counseled on benefits of CGM but she declines Counseled on Diabetic diet, my plate method, [id number] minutes of moderate intensity exercise/week Blood sugar logs with fasting goals of 80-120 mg/dl, random of less than 180 and in the event of sugars less than 60 mg/dl or greater than 400 mg/dl encouraged to notify the clinic. Advised on the need for annual eye exams, annual foot exams, Pneumonia vaccine. - POCT glucose (manual entry) - POCT glycosylated hemoglobin (Hb A1C) - gabapentin (NEURONTIN) 300 MG capsule; Take 1 capsule (300 mg total) by mouth at bedtime.  Dispense: 30 capsule; Refill: 3 - tirzepatide (MOUNJARO) 12.5 MG/0.5ML Pen; Inject 12.5 mg into the skin once a week. For 4 weeks then increase to 15 mg once a week thereafter  Dispense: 6 mL; Refill: 0 - tirzepatide (MOUNJARO) 15 MG/0.5ML Pen; Inject 15 mg into the skin once a week.  Dispense: 6 mL; Refill: 6 - Basic Metabolic Panel - insulin glargine (LANTUS) 100 UNIT/ML Solostar Pen; Inject 30 Units into the skin daily.  Dispense: 15 mL; Refill: 3  2. Right foot pain Osteoarthritis versus neuropathy Restarted gabapentin and she has been advised to take this at night She is already on meloxicam Use good support shoes as she is always on her feet After x-ray will refer to podiatry - [facility] Foot Complete Right; Future  3. Lymphedema Use compression stockings Weight loss will be beneficial  4. Hyperlipidemia associated with type 2 diabetes mellitus ([place]) Due for lipid panel Has been advised to come in fasting at her next visit Low-cholesterol diet - atorvastatin (LIPITOR) 20 MG tablet; TAKE 1 TABLET(20 MG) BY MOUTH DAILY  Dispense: 90 tablet; Refill: 1  5. Hypertension associated with type 2 diabetes mellitus (HCC) Uncontrolled Increased valsartan Counseled on blood pressure goal of less than 130/80, low-sodium, DASH diet, medication compliance, 150 minutes of moderate intensity exercise per  week. Discussed medication compliance, adverse effects. - valsartan (DIOVAN) 80 MG tablet; Take 1 tablet (80 mg total) by mouth daily.  Dispense: 90 tablet; Refill: 1    Meds ordered this encounter  Medications   gabapentin (NEURONTIN) 300 MG capsule    Sig: Take 1 capsule (300 mg total) by mouth at bedtime.    Dispense:  30 capsule    Refill:  3   tirzepatide (MOUNJARO) 12.5 MG/0.5ML Pen    Sig: Inject 12.5 mg into  the skin once a week. For 4 weeks then increase to 15 mg once a week thereafter    Dispense:  6 mL    Refill:  0    Discontinue Ozempic   tirzepatide (MOUNJARO) 15 MG/0.5ML Pen    Sig: Inject 15 mg into the skin once a week.    Dispense:  6 mL    Refill:  6   atorvastatin (LIPITOR) 20 MG tablet    Sig: TAKE 1 TABLET(20 MG) BY MOUTH DAILY    Dispense:  90 tablet    Refill:  1   valsartan (DIOVAN) 80 MG tablet    Sig: Take 1 tablet (80 mg total) by mouth daily.    Dispense:  90 tablet    Refill:  1    Dose increase   insulin glargine (LANTUS) 100 UNIT/ML Solostar Pen    Sig: Inject 30 Units into the skin daily.    Dispense:  15 mL    Refill:  3    Dose increase    Follow-up: Return in about 1 month (around 11/08/2022) for Blood sugar evaluation with [name], Chronic medical conditions with PCP in 3 months.       [name], MD, FAAFP. [facility], [place]   10/08/2022, 12:55 PM

## 2022-10-08 NOTE — Patient Instructions (Signed)
Edema ? ?Edema is when you have too much fluid in your body or under your skin. Edema may make your legs, feet, and ankles swell. Swelling often happens in looser tissues, such as around your eyes. This is a common condition. It gets more common as you get older. ?There are many possible causes of edema. These include: ?Eating too much salt (sodium). ?Being on your feet or sitting for a long time. ?Certain medical conditions, such as: ?Pregnancy. ?Heart failure. ?Liver disease. ?Kidney disease. ?Cancer. ?Hot weather may make edema worse. Edema is usually painless. Your skin may look swollen or shiny. ?Follow these instructions at home: ?Medicines ?Take over-the-counter and prescription medicines only as told by your doctor. ?Your doctor may prescribe a medicine to help your body get rid of extra water (diuretic). Take this medicine if you are told to take it. ?Eating and drinking ?Eat a low-salt (low-sodium) diet as told by your doctor. Sometimes, eating less salt may reduce swelling. ?Depending on the cause of your swelling, you may need to limit how much fluid you drink (fluid restriction). ?General instructions ?Raise the injured area above the level of your heart while you are sitting or lying down. ?Do not sit still or stand for a long time. ?Do not wear tight clothes. Do not wear garters on your upper legs. ?Exercise your legs. This can help the swelling go down. ?Wear compression stockings as told by your doctor. It is important that these are the right size. These should be prescribed by your doctor to prevent possible injuries. ?If elastic bandages or wraps are recommended, use them as told by your doctor. ?Contact a doctor if: ?Treatment is not working. ?You have heart, liver, or kidney disease and have symptoms of edema. ?You have sudden and unexplained weight gain. ?Get help right away if: ?You have shortness of breath or chest pain. ?You cannot breathe when you lie down. ?You have pain, redness, or  warmth in the swollen areas. ?You have heart, liver, or kidney disease and get edema all of a sudden. ?You have a fever and your symptoms get worse all of a sudden. ?These symptoms may be an emergency. Get help right away. Call 911. ?Do not wait to see if the symptoms will go away. ?Do not drive yourself to the hospital. ?Summary ?Edema is when you have too much fluid in your body or under your skin. ?Edema may make your legs, feet, and ankles swell. Swelling often happens in looser tissues, such as around your eyes. ?Raise the injured area above the level of your heart while you are sitting or lying down. ?Follow your doctor's instructions about diet and how much fluid you can drink. ?This information is not intended to replace advice given to you by your health care provider. Make sure you discuss any questions you have with your health care provider. ?Document Revised: 03/20/2021 Document Reviewed: 03/20/2021 ?Elsevier Patient Education ? 2023 [employer] ? ?

## 2022-10-09 ENCOUNTER — Other Ambulatory Visit: Payer: Self-pay

## 2022-10-09 LAB — BASIC METABOLIC PANEL
BUN/Creatinine Ratio: 17 (ref 9–23)
BUN: 16 mg/dL (ref 6–24)
CO2: 23 mmol/L (ref 20–29)
Calcium: 9.6 mg/dL (ref 8.7–10.2)
Chloride: 102 mmol/L (ref 96–106)
Creatinine, Ser: 0.92 mg/dL (ref 0.57–1.00)
Glucose: 334 mg/dL — ABNORMAL HIGH (ref 70–99)
Potassium: 5.1 mmol/L (ref 3.5–5.2)
Sodium: 141 mmol/L (ref 134–144)
eGFR: 77 mL/min/{1.73_m2} (ref >59)

## 2022-10-10 ENCOUNTER — Ambulatory Visit: Payer: Self-pay | Admitting: *Deleted

## 2022-10-10 NOTE — Telephone Encounter (Signed)
FYI

## 2022-10-10 NOTE — Telephone Encounter (Signed)
Summary: Pt has questions/concerns about medication   Pt stated she needs a nurse to return her call as she has some questions/concerns about her medication. Pt declined to elaborate. 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 Reports needle is same as trulicity . Patient reports she will try again . Please advise    Reason for Disposition  Caller wants to use a complementary or alternative medicine  Answer Assessment - Initial Assessment Questions 1. NAME of MEDICINE: "What medicine(s) are you calling about?"     mounjaro 2. QUESTION: "What is your question?" (e.g., double dose of medicine, side effect)     Medication needle is same as trulicity and patient does not think she can self administer injection due to phobia of needles. Is there another medication to prescribe? 3. PRESCRIBER: "Who prescribed the medicine?" Reason: if prescribed by specialist, call should be referred to that group.     PCP 4. SYMPTOMS: "Do you have any symptoms?" If Yes, ask: "What symptoms are you having?"  "How bad are the symptoms (e.g., mild, moderate, severe)     Phobia to needles 5. PREGNANCY:  "Is there any chance that you are pregnant?" "When was your last menstrual period?"     na  Protocols used: Medication Question Call-A-AH

## 2022-10-11 ENCOUNTER — Other Ambulatory Visit: Payer: Self-pay | Admitting: Family Medicine

## 2022-10-11 DIAGNOSIS — M79671 Pain in right foot: Secondary | ICD-10-CM

## 2022-10-11 NOTE — Telephone Encounter (Signed)
Hi Luke, she was previously on Ozempic and I switched this to [facility] for additional weight loss benefit at her visit yesterday.  Can you please give her a call to follow-up on this?  Thank you.

## 2022-10-12 NOTE — Telephone Encounter (Signed)
Call patient to schedule an appointment with [name] for injection teaching, patient states that she does not need the appointment she is ok with using the injection.

## 2022-10-19 ENCOUNTER — Ambulatory Visit (INDEPENDENT_AMBULATORY_CARE_PROVIDER_SITE_OTHER): Payer: Medicaid Other | Admitting: Podiatry

## 2022-10-19 DIAGNOSIS — E119 Type 2 diabetes mellitus without complications: Secondary | ICD-10-CM | POA: Diagnosis not present

## 2022-10-19 NOTE — Progress Notes (Signed)
   Chief Complaint  Patient presents with   Diabetes    Diabetic foot care, A1c- 12.5 BG- not taking, stinging on the bottom of the forefoot and right foot top of the foot,  callus, started year ago,  X-Rays done by PCP, shows Arthritis patient is already taking gabapentin and Mobic     HPI: 47 y.o. female presenting today as a reestablish new patient for evaluation of burning with shooting sensations to the bilateral feet.  Patient states that until recently she was not taking her diabetes seriously.  Last A1c 12.5.  She has been experiencing stinging with shocking type sensation to the bilateral feet over a year ago.  Gradual onset.  Patient currently on gabapentin from her PCP.  Past Medical History:  Diagnosis Date   Arthritis    knees   Diabetes mellitus without complication ([place])    Hyperlipidemia    Hypertension    Ovarian cyst    Pregnancy induced hypertension    Type 2 diabetes mellitus ([place]) 01/08/2018    Past Surgical History:  Procedure Laterality Date   APPENDECTOMY     CESAREAN SECTION     HERNIA REPAIR     LAPAROSCOPIC APPENDECTOMY N/A 03/25/2014   Procedure: APPENDECTOMY LAPAROSCOPIC;  Surgeon: Gwenyth Ober, MD;  Location: [place];  Service: General;  Laterality: N/A;    No Known Allergies   Physical Exam: General: The patient is alert and oriented x3 in no acute distress.  Dermatology: Skin is warm, dry and supple bilateral lower extremities. Negative for open lesions or macerations.  Vascular: Palpable pedal pulses bilaterally. Capillary refill within normal limits.  Negative for any significant edema or erythema  Neurological: Light touch and protective threshold diminished  Musculoskeletal Exam: No pedal deformities noted   Assessment: 1.  T2DM; uncontrolled with peripheral polyneuropathy   Plan of Care:  1. Patient evaluated. X-Rays reviewed.  Comprehensive diabetic foot exam performed today 2.  Today we had a conversation of the importance of  reducing and managing her diabetes.  I do believe the patient is suffering from peripheral neuropathy of the feet secondary to her uncontrolled diabetes.  Although peripheral neuropathy is irreversible, explained that if she is able to get her diabetes under control the neuropathy tends to become less severe in his symptoms. 3.  Advised against going barefoot.  Recommend good supportive shoes and sneakers 4.  Return to clinic annually      Edrick Kins, DPM [facility]  Dr. Edrick Kins, DPM    [address], [place] [address]                Office [phone]  Fax [phone]

## 2022-10-22 ENCOUNTER — Telehealth: Payer: Self-pay | Admitting: Internal Medicine

## 2022-10-22 NOTE — Telephone Encounter (Signed)
Copied from [place] [id number]. Topic: General - Other >> Oct 22, 2022  8:39 AM Oley Balm A wrote: Reason for CRM: Pt is calling wanting a nurse from her PCP office to call her back. Pt is wanting a medication prescribed for her. When asking the pt the name of the medication she states that she cannot pronounce  the mediation nor spell the medication and would like a call back to discuss.

## 2022-10-23 NOTE — Telephone Encounter (Signed)
Call placed to patient unable to reach message left on VM to call and schedule vv

## 2022-10-24 ENCOUNTER — Telehealth: Payer: Self-pay

## 2022-10-24 MED ORDER — FLUCONAZOLE 150 MG PO TABS: 150.0000 mg | ORAL_TABLET | Freq: Once | ORAL | 0 refills | Status: AC

## 2022-10-24 MED ORDER — METRONIDAZOLE 0.75 % VA GEL: 1.0000 | Freq: Every day | VAGINAL | 0 refills | Status: AC

## 2022-10-24 NOTE — Addendum Note (Signed)
Addended by: [name] on: 10/24/2022 05:44 PM   Modules accepted: Orders

## 2022-10-24 NOTE — Telephone Encounter (Signed)
Per verbal report obtained from RN with patient complaining of vaginal discharge and possible BV I have sent prescription for MetroGel and Diflucan to the pharmacy.

## 2022-10-24 NOTE — Telephone Encounter (Signed)
Copied from [place] [id number]. Topic: General - Other >> Oct 22, 2022  8:39 AM Oley Balm A wrote: Reason for CRM: Pt is calling wanting a nurse from her PCP office to call her back. Pt is wanting a medication prescribed for her. When asking the pt the name of the medication she states that she cannot pronounce  the mediation nor spell the medication and would like a call back to discuss.

## 2022-10-24 NOTE — Telephone Encounter (Signed)
Medication called to patient pharmacy by PCP

## 2022-10-25 NOTE — Telephone Encounter (Signed)
Noted and Patient is aware

## 2022-11-12 ENCOUNTER — Encounter: Payer: Self-pay | Admitting: Pharmacist

## 2022-11-12 ENCOUNTER — Ambulatory Visit: Payer: Medicaid Other | Attending: Family Medicine | Admitting: Pharmacist

## 2022-11-12 VITALS — Wt 342.3 lb

## 2022-11-12 DIAGNOSIS — E1142 Type 2 diabetes mellitus with diabetic polyneuropathy: Secondary | ICD-10-CM | POA: Diagnosis not present

## 2022-11-12 NOTE — Progress Notes (Signed)
S:     No chief complaint on file.  47 y.o. female who presents for diabetes evaluation, education, and management.  PMH is significant for HTN and T2DM.   Patient was referred and last seen by Primary Care Provider, Dr. Alvis Lemmings, on 10/08/2022.  At last visit, A1c increased to 12.3% from 9.9% previously. Dr. Alvis Lemmings increased Lantus from 22 units to 30 units daily and Ozempic was changed to [facility].   Today, patient arrives in good spirits and presents without any assistance. Patient was unable to start [facility] as she does not like the clicking sound and was unable to inject herself. She reports wasting 2 Mounjaro pens from hearing the click and pulling away. She was not willing to restart Ozempic due to bloating side effects. Agreeable to re-try [facility] with her husband giving Mounjaro injections. Since seeing Dr. Alvis Lemmings on 10/08/2022, has restarted taking her insulin. She reports A1c was likely increased to 12.3% as she stopped taking her insulin for at least a month prior. She reports since she has restarted her insulin, she is feeling better.   Family/Social History: heart disease in maternal grandmother  Current diabetes medications include: Lantus 30 units daily, Mounjaro 12.5 mg weekly (not taking) Current hypertension medications include: valsartan 80 mg daily, hydrochlorothiazide 25 mg  Current hyperlipidemia medications include: atorvastatin 20 mg daily  Patient reports adherence. She reports she has been taking Lantus everyday since last appointment with Dr. Alvis Lemmings. She has not started [facility].   Patient reports hypoglycemic events.  Patient reports nocturia (nighttime urination). Decreased from every hour to every 4-5 hours Patient reports neuropathy (nerve pain) in right foot.  Patient reports visual changes. Patient reports self foot exams.   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  Lipid Panel     Component Value Date/Time   CHOL 199 11/23/2021 1223   TRIG 161 (H) 11/23/2021 1223   HDL 53 11/23/2021 1223   CHOLHDL 3.8 11/23/2021 1223   CHOLHDL 5.1 02/13/2021 0932   VLDL 26 02/13/2021 0932   LDLCALC 118 (H) 11/23/2021 1223    Clinical Atherosclerotic Cardiovascular Disease (ASCVD): No  The 10-year ASCVD risk score (Arnett DK, et al., 2019) is: 18.9%   Values used to calculate the score:     Age: 5 years     Sex: Female     Is Non-Hispanic African American: Yes     Diabetic: Yes     Tobacco smoker: No     Systolic Blood Pressure: 163 mmHg     Is BP treated: Yes     HDL Cholesterol: 53 mg/dL     Total Cholesterol: 199 mg/dL   Patient is participating in a Managed Medicaid Plan:  Yes   A/P: Diabetes longstanding currently uncontrolled with A1c 12.3%. Patient is able to verbalize appropriate hypoglycemia management plan. Medication adherence appears improved. Control is suboptimal due to not taking Mounjaro or checking blood sugars. -Continued basal insulin Lantus (insulin glargine) 30 units daily. Did not increase today as no blood glucose readings available. -Restarted GLP-1 Mounjaro (tirzepatide) 12.5 mg weekly. -Discussed importance of measuring blood glucose. Patient agreeable to start checking BG 2x per week. Discussed GCM and patient was not interested.  -Patient  educated on purpose, proper use, and potential adverse effects of Mounjaro.  -Extensively discussed pathophysiology of diabetes, recommended lifestyle interventions, dietary effects on blood sugar control.  -Counseled on s/sx of and management of hypoglycemia.  -Next A1c anticipated June 2024.   Written patient instructions provided. Patient verbalized understanding of treatment plan.  Total  time in face to face counseling 20 minutes.    Follow-up:  Pharmacist 1 month. PCP clinic visit on 01/08/2023.   Georga Hacking, Pharm.D PGY1 Pharmacy Resident 11/12/2022 10:58 AM

## 2022-11-13 ENCOUNTER — Other Ambulatory Visit: Payer: Medicaid Other

## 2022-12-17 ENCOUNTER — Encounter: Payer: Self-pay | Admitting: Pharmacist

## 2022-12-17 ENCOUNTER — Ambulatory Visit: Payer: Medicaid Other | Attending: Family Medicine | Admitting: Pharmacist

## 2022-12-17 DIAGNOSIS — Z794 Long term (current) use of insulin: Secondary | ICD-10-CM

## 2022-12-17 DIAGNOSIS — E1142 Type 2 diabetes mellitus with diabetic polyneuropathy: Secondary | ICD-10-CM | POA: Diagnosis not present

## 2022-12-17 DIAGNOSIS — Z7985 Long-term (current) use of injectable non-insulin antidiabetic drugs: Secondary | ICD-10-CM

## 2022-12-17 NOTE — Progress Notes (Signed)
S:     No chief complaint on file.  47 y.o. female who presents for diabetes evaluation, education, and management.  PMH is significant for HTN and T2DM. Patient was referred and last seen by Primary Care Provider, Dr. Alvis Lemmings, on 10/08/2022. Pharmacy saw her on 11/12/2022 and had her restart the [facility].   Today, patient arrives in good spirits and presents without any assistance. Patient is till not taking Mounjaro as she is not able to administer the auto-injecting pen. She reports wasting 2 Mounjaro pens from hearing the click and pulling away. Her plan was to have her husband assist with these but he travels as a truck driver and is unable to do so. She is consistently using her Lantus now and denies any hyper- or hypoglycemia-associated symptoms. She is still not checking blood sugar at home.   Family/Social History: heart disease in maternal grandmother  Current diabetes medications include: Lantus 30 units daily, Mounjaro 12.5 mg weekly (not taking) Current hypertension medications include: valsartan 80 mg daily, hydrochlorothiazide 25 mg  Current hyperlipidemia medications include: atorvastatin 20 mg daily  Patient reports adherence. She reports she has been taking Lantus everyday since last appointment with Dr. Alvis Lemmings. She has not started [facility].   Patient reports hypoglycemic events.  Patient reports nocturia (nighttime urination). Decreased from every hour to every 4-5 hours Patient reports neuropathy (nerve pain) in right foot.  Patient reports visual changes. Patient reports self foot exams.   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 Does not wish to use CGM at this time.   Lab Results  Component Value Date   HGBA1C 12.3 (A) 10/08/2022   There were no vitals filed for this visit.  Lipid  Panel     Component Value Date/Time   CHOL 199 11/23/2021 1223   TRIG 161 (H) 11/23/2021 1223   HDL 53 11/23/2021 1223   CHOLHDL 3.8 11/23/2021 1223   CHOLHDL 5.1 02/13/2021 0932   VLDL 26 02/13/2021 0932   LDLCALC 118 (H) 11/23/2021 1223   Clinical Atherosclerotic Cardiovascular Disease (ASCVD): No  The 10-year ASCVD risk score (Arnett DK, et al., 2019) is: 18.9%   Values used to calculate the score:     Age: 63 years     Sex: Female     Is Non-Hispanic African American: Yes     Diabetic: Yes     Tobacco smoker: No     Systolic Blood Pressure: 163 mmHg     Is BP treated: Yes     HDL Cholesterol: 53 mg/dL     Total Cholesterol: 199 mg/dL   Patient is participating in a Managed Medicaid Plan:  Yes   A/P: Diabetes longstanding currently uncontrolled with A1c 12.3%. Patient is able to verbalize appropriate hypoglycemia management plan. Medication adherence appears improved but still suboptimal. Control is suboptimal due to not taking Mounjaro or checking blood sugars. -Continued basal insulin Lantus (insulin glargine) 30 units daily.  -Restarted GLP-1 Mounjaro (tirzepatide) 12.5 mg weekly. 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 She is going to have a coworker administer this.  -Discussed importance of measuring blood glucose. Patient agreeable to start checking BG 2x per week. Discussed GCM and patient was not interested.  -Patient educated on purpose, proper use, and potential adverse effects of Mounjaro.  -Extensively discussed pathophysiology of diabetes, recommended lifestyle interventions,  dietary effects on blood sugar control.  -Counseled on s/sx of and management of hypoglycemia.  -Next A1c anticipated June 2024.   Written patient instructions provided. Patient verbalized understanding of treatment plan.  Total time in face to face counseling 20 minutes.    Follow-up:  Pharmacist 1 month. PCP clinic visit on 01/08/2023.   Butch Penny, PharmD, [place],  CPP Clinical Pharmacist [facility] [phone]

## 2023-01-07 ENCOUNTER — Telehealth: Payer: Self-pay

## 2023-01-07 NOTE — Telephone Encounter (Signed)
Patient attempted to be outreached by Sharman Cheek, PharmD Candidate on 01/07/23 to discuss hypertension. Left voicemail for patient to return our call at their convenience at [phone].  [facility], [place]

## 2023-01-08 ENCOUNTER — Other Ambulatory Visit: Payer: Self-pay

## 2023-01-08 ENCOUNTER — Ambulatory Visit: Payer: Medicaid Other | Attending: Family Medicine | Admitting: Family Medicine

## 2023-01-08 VITALS — BP 161/91 | HR 70 | Temp 98.0°F | Ht 68.0 in | Wt 345.6 lb

## 2023-01-08 DIAGNOSIS — I4510 Unspecified right bundle-branch block: Secondary | ICD-10-CM | POA: Insufficient documentation

## 2023-01-08 DIAGNOSIS — L659 Nonscarring hair loss, unspecified: Secondary | ICD-10-CM | POA: Diagnosis not present

## 2023-01-08 DIAGNOSIS — R519 Headache, unspecified: Secondary | ICD-10-CM | POA: Diagnosis not present

## 2023-01-08 DIAGNOSIS — L299 Pruritus, unspecified: Secondary | ICD-10-CM | POA: Diagnosis not present

## 2023-01-08 DIAGNOSIS — E1142 Type 2 diabetes mellitus with diabetic polyneuropathy: Secondary | ICD-10-CM | POA: Insufficient documentation

## 2023-01-08 DIAGNOSIS — R079 Chest pain, unspecified: Secondary | ICD-10-CM | POA: Insufficient documentation

## 2023-01-08 DIAGNOSIS — I152 Hypertension secondary to endocrine disorders: Secondary | ICD-10-CM | POA: Diagnosis not present

## 2023-01-08 DIAGNOSIS — R531 Weakness: Secondary | ICD-10-CM | POA: Insufficient documentation

## 2023-01-08 DIAGNOSIS — E1140 Type 2 diabetes mellitus with diabetic neuropathy, unspecified: Secondary | ICD-10-CM | POA: Diagnosis present

## 2023-01-08 DIAGNOSIS — Z760 Encounter for issue of repeat prescription: Secondary | ICD-10-CM | POA: Insufficient documentation

## 2023-01-08 DIAGNOSIS — R29818 Other symptoms and signs involving the nervous system: Secondary | ICD-10-CM | POA: Diagnosis not present

## 2023-01-08 DIAGNOSIS — Z794 Long term (current) use of insulin: Secondary | ICD-10-CM | POA: Insufficient documentation

## 2023-01-08 DIAGNOSIS — Z87891 Personal history of nicotine dependence: Secondary | ICD-10-CM | POA: Diagnosis not present

## 2023-01-08 DIAGNOSIS — Z7985 Long-term (current) use of injectable non-insulin antidiabetic drugs: Secondary | ICD-10-CM | POA: Insufficient documentation

## 2023-01-08 DIAGNOSIS — Z79899 Other long term (current) drug therapy: Secondary | ICD-10-CM | POA: Diagnosis not present

## 2023-01-08 DIAGNOSIS — E1159 Type 2 diabetes mellitus with other circulatory complications: Secondary | ICD-10-CM

## 2023-01-08 DIAGNOSIS — M179 Osteoarthritis of knee, unspecified: Secondary | ICD-10-CM | POA: Insufficient documentation

## 2023-01-08 LAB — POCT GLYCOSYLATED HEMOGLOBIN (HGB A1C): HbA1c, POC (controlled diabetic range): 9.1 % — AB (ref 0.0–7.0)

## 2023-01-08 MED ORDER — TIRZEPATIDE 12.5 MG/0.5ML ~~LOC~~ SOAJ
12.5000 mg | SUBCUTANEOUS | 0 refills | Status: DC
Start: 2023-01-08 — End: 2023-01-22
  Filled 2023-01-08: qty 2, 28d supply, fill #0

## 2023-01-08 MED ORDER — TRIAMCINOLONE ACETONIDE 0.1 % EX CREA
1.0000 | TOPICAL_CREAM | Freq: Two times a day (BID) | CUTANEOUS | 1 refills | Status: DC
Start: 2023-01-08 — End: 2023-12-02

## 2023-01-08 MED ORDER — TIRZEPATIDE 15 MG/0.5ML ~~LOC~~ SOAJ
15.0000 mg | SUBCUTANEOUS | 6 refills | Status: DC
Start: 2023-01-08 — End: 2023-01-22
  Filled 2023-01-08: qty 2, 28d supply, fill #0

## 2023-01-08 NOTE — Progress Notes (Signed)
Back pain Hair shedding wants to check tyroid Headaches.

## 2023-01-08 NOTE — Progress Notes (Signed)
Subjective:  Patient ID: Nicole [name], female    DOB: August 09, 1975  Age: 47 y.o. MRN: [id number]  CC: Diabetes   HPI Nicole [name] is a 47 y.o. year old female with a history of morbid obesity, Type 2 DM (A1c 9.1), diabetic neuropathy osteoarthritis of the knee, tobacco abuse here for follow-up of chronic medical conditions    Interval History:  A1c is 9.1 down from 12.3 at the last visit.  Greggory Keen has been on back order and her last dose was about a month ago.  She has been adherent with Lantus 30 units.  She does not exercise much. Her blood pressure is elevated and she endorses adherence with her antihypertensive.  She complains of her hair shedding and falling out for the last couple of months and she would like her thyroid checked. She would touch her hair and it would come off. She cut it off with some reduction in shedding but will endorses some presence of hair loss.  She has daily headaches starting in her occiput and radiates anteriorly. It occurs daily with no associated photophobia or nausea. Symptoms are present now but it s a 2/10. She sleeps for 4 hours but is unsure if she snores. Endorses presence of somnolence.  Last year she was diagnosed with tension headaches and placed on Topamax.  She did have an episode where her left side felt 'weird' with associated weakness and she just wanted to lie down.  She had no speech or gait impairment.  This occurred 2 days ago and then symptoms resolved. She has a spot on her back x4 weeks which has been itchy and when she scratches it hurts  She complains of intermittent episodes of chest pain. Past Medical History:  Diagnosis Date   Arthritis    knees   Diabetes mellitus without complication (HCC)    Hyperlipidemia    Hypertension    Ovarian cyst    Pregnancy induced hypertension    Type 2 diabetes mellitus (HCC) 01/08/2018    Past Surgical History:  Procedure Laterality Date   APPENDECTOMY     CESAREAN SECTION      HERNIA REPAIR     LAPAROSCOPIC APPENDECTOMY N/A 03/25/2014   Procedure: APPENDECTOMY LAPAROSCOPIC;  Surgeon: Cherylynn Ridges, MD;  Location: [facility] OR;  Service: General;  Laterality: N/A;    Family History  Problem Relation Age of Onset   Hypertension Mother    Cancer Mother        pancreatic cancer   Arthritis Father    Heart disease Maternal Grandmother     Social History   Socioeconomic History   Marital status: Single    Spouse name: Not on file   Number of children: 2   Years of education: Not on file   Highest education level: Not on file  Occupational History   Not on file  Tobacco Use   Smoking status: Former    Packs/day: 0.50    Years: 27.00    Additional pack years: 0.00    Total pack years: 13.50    Types: Cigarettes    Quit date: 10/28/2020    Years since quitting: 2.1   Smokeless tobacco: Never   Tobacco comments:    6 cigs daily  Vaping Use   Vaping Use: Never used  Substance and Sexual Activity   Alcohol use: Not Currently    Comment: occ   Drug use: Yes    Frequency: 7.0 times per week    Types:  Marijuana   Sexual activity: Not Currently    Comment: 5 years  Other Topics Concern   Not on file  Social History Narrative   Not on file   Social Determinants of Health   Financial Resource Strain: Low Risk  (11/12/2022)   Overall Financial Resource Strain (CARDIA)    Difficulty of Paying Living Expenses: Not very hard  Food Insecurity: No Food Insecurity (11/12/2022)   Hunger Vital Sign    Worried About Running Out of Food in the Last Year: Never true    Ran Out of Food in the Last Year: Never true  Transportation Needs: No Transportation Needs (05/24/2020)   PRAPARE - Administrator, Civil Service (Medical): No    Lack of Transportation (Non-Medical): No  Physical Activity: Inactive (11/12/2022)   Exercise Vital Sign    Days of Exercise per Week: 0 days    Minutes of Exercise per Session: 0 min  Stress: No Stress Concern Present  (11/12/2022)   [employer] of Occupational Health - Occupational Stress Questionnaire    Feeling of Stress : Not at all  Social Connections: Moderately Integrated (11/12/2022)   Social Connection and Isolation Panel [NHANES]    Frequency of Communication with Friends and Family: More than three times a week    Frequency of Social Gatherings with Friends and Family: More than three times a week    Attends Religious Services: 1 to 4 times per year    Active Member of [employer] or Organizations: No    Attends Banker Meetings: Never    Marital Status: Living with partner    No Known Allergies  Outpatient Medications Prior to Visit  Medication Sig Dispense Refill   Accu-Chek Softclix Lancets lancets Use as instructed tid before meals 100 each 12   albuterol (VENTOLIN HFA) 108 (90 Base) MCG/ACT inhaler Inhale 2 puffs into the lungs every 6 (six) hours as needed for wheezing or shortness of breath. 18 g 6   atorvastatin (LIPITOR) 20 MG tablet TAKE 1 TABLET(20 MG) BY MOUTH DAILY 90 tablet 1   blood glucose meter kit and supplies KIT Dispense based on patient and insurance preference. Use up to four times daily as directed. 1 each 0   budesonide-formoterol (SYMBICORT) 160-4.5 MCG/ACT inhaler Inhale 2 puffs into the lungs daily. 1 each 0   gabapentin (NEURONTIN) 300 MG capsule Take 1 capsule (300 mg total) by mouth at bedtime. 30 capsule 3   glucose blood (ACCU-CHEK GUIDE) test strip Use as instructed 100 each 12   glucose blood (FREESTYLE LITE) test strip Use as instructed 100 each 12   hydrochlorothiazide (HYDRODIURIL) 25 MG tablet Take 1 tablet (25 mg total) by mouth daily. 90 tablet 1   insulin glargine (LANTUS) 100 UNIT/ML Solostar Pen Inject 30 Units into the skin daily. 15 mL 3   lidocaine (LIDODERM) 5 % Place 1 patch onto the skin daily. Remove & Discard patch within 12 hours or as directed by MD 30 patch 1   meloxicam (MOBIC) 7.5 MG tablet Take 1 tablet (7.5 mg total) by  mouth daily. 30 tablet 1   tiotropium (SPIRIVA HANDIHALER) 18 MCG inhalation capsule Place 1 capsule (18 mcg total) into inhaler and inhale daily. 30 capsule 6   topiramate (TOPAMAX) 25 MG tablet Take 1 tablet (25 mg total) by mouth 2 (two) times daily. 60 tablet 3   valsartan (DIOVAN) 80 MG tablet Take 1 tablet (80 mg total) by mouth daily. 90 tablet 1  tirzepatide (MOUNJARO) 12.5 MG/0.5ML Pen Inject 12.5 mg into the skin once a week. For 4 weeks then increase to 15 mg once a week thereafter 6 mL 0   tirzepatide (MOUNJARO) 15 MG/0.5ML Pen Inject 15 mg into the skin once a week. 6 mL 6   No facility-administered medications prior to visit.     ROS Review of Systems  Constitutional:  Negative for activity change and appetite change.  HENT:  Negative for sinus pressure and sore throat.   Respiratory:  Negative for chest tightness, shortness of breath and wheezing.   Cardiovascular:  Positive for chest pain. Negative for palpitations.  Gastrointestinal:  Negative for abdominal distention, abdominal pain and constipation.  Genitourinary: Negative.   Musculoskeletal:  Positive for back pain.  Skin:        See HPI  Neurological:  Positive for headaches.  Psychiatric/Behavioral:  Negative for behavioral problems and dysphoric mood.     Objective:  BP (!) 161/91   Pulse 70   Temp 98 F (36.7 C) (Oral)   Ht 5\' 8"  (1.727 m)   Wt (!) 345 lb 9.6 oz (156.8 kg)   SpO2 99%   BMI 52.55 kg/m      01/08/2023   12:35 PM 01/08/2023   11:37 AM 11/12/2022   10:41 AM  BP/Weight  Systolic BP 161 156   Diastolic BP 91 87   Wt. (Lbs)  345.6 342.3  BMI  52.55 kg/m2 52.05 kg/m2    Wt Readings from Last 3 Encounters:  01/08/23 (!) 345 lb 9.6 oz (156.8 kg)  11/12/22 (!) 342 lb 4.8 oz (155.3 kg)  10/08/22 (!) 347 lb (157.4 kg)      Physical Exam Constitutional:      Appearance: She is well-developed. She is obese.  Cardiovascular:     Rate and Rhythm: Normal rate.     Heart sounds:  Normal heart sounds. No murmur heard. Pulmonary:     Effort: Pulmonary effort is normal.     Breath sounds: Normal breath sounds. No wheezing or rales.  Chest:     Chest wall: No tenderness.  Abdominal:     General: Bowel sounds are normal. There is no distension.     Palpations: Abdomen is soft. There is no mass.     Tenderness: There is no abdominal tenderness.  Musculoskeletal:        General: Normal range of motion.     Right lower leg: No edema.     Left lower leg: No edema.  Skin:    Comments: Area of concern in the back is at the region where her bra strap touches her back but no rash is evident.  Neurological:     Mental Status: She is alert and oriented to person, place, and time.  Psychiatric:        Mood and Affect: Mood normal.        Latest Ref Rng & Units 10/08/2022    1:45 PM 07/16/2022   11:30 AM 04/21/2022    1:13 PM  CMP  Glucose 70 - 99 mg/dL 161  096  045   BUN 6 - 24 mg/dL 16  14  11    Creatinine 0.57 - 1.00 mg/dL 4.09  8.11  9.14   Sodium 134 - 144 mmol/L 141  135  135   Potassium 3.5 - 5.2 mmol/L 5.1  5.0  3.9   Chloride 96 - 106 mmol/L 102  99  100   CO2 20 - 29 mmol/L  23  23  25    Calcium 8.7 - 10.2 mg/dL 9.6  9.5  9.4   Total Protein 6.0 - 8.5 g/dL  6.9    Total Bilirubin 0.0 - 1.2 mg/dL  <1.6    Alkaline Phos 44 - 121 IU/L  140    AST 0 - 40 IU/L  20    ALT 0 - 32 IU/L  25      Lipid Panel     Component Value Date/Time   CHOL 199 11/23/2021 1223   TRIG 161 (H) 11/23/2021 1223   HDL 53 11/23/2021 1223   CHOLHDL 3.8 11/23/2021 1223   CHOLHDL 5.1 02/13/2021 0932   VLDL 26 02/13/2021 0932   LDLCALC 118 (H) 11/23/2021 1223    CBC    Component Value Date/Time   WBC 7.2 04/21/2022 1313   RBC 4.82 04/21/2022 1313   HGB 12.7 04/21/2022 1313   HGB 12.6 02/28/2021 1629   HCT 39.3 04/21/2022 1313   HCT 38.4 02/28/2021 1629   PLT 197 04/21/2022 1313   PLT 265 02/28/2021 1629   MCV 81.5 04/21/2022 1313   MCV 82 02/28/2021 1629   MCH 26.3  04/21/2022 1313   MCHC 32.3 04/21/2022 1313   RDW 13.9 04/21/2022 1313   RDW 13.4 02/28/2021 1629   LYMPHSABS 0.4 (L) 04/21/2022 1313   LYMPHSABS 2.8 02/28/2021 1629   MONOABS 1.0 04/21/2022 1313   EOSABS 0.1 04/21/2022 1313   EOSABS 0.2 02/28/2021 1629   BASOSABS 0.0 04/21/2022 1313   BASOSABS 0.1 02/28/2021 1629    Lab Results  Component Value Date   HGBA1C 9.1 (A) 01/08/2023    Assessment & Plan:  1. Type 2 diabetes mellitus with peripheral neuropathy (HCC) Controlled with A1c of 9.1 She has been out of Mounjaro and this has been refilled Counseled on Diabetic diet, my plate method, 109 minutes of moderate intensity exercise/week Blood sugar logs with fasting goals of 80-120 mg/dl, random of less than 604 and in the event of sugars less than 60 mg/dl or greater than 540 mg/dl encouraged to notify the clinic. Advised on the need for annual eye exams, annual foot exams, Pneumonia vaccine. - POCT glycosylated hemoglobin (Hb A1C) - tirzepatide (MOUNJARO) 12.5 MG/0.5ML Pen; Inject 12.5 mg into the skin once a week. For 4 weeks then increase to 15 mg once a week thereafter  Dispense: 6 mL; Refill: 0 - tirzepatide (MOUNJARO) 15 MG/0.5ML Pen; Inject 15 mg into the skin once a week.  Dispense: 6 mL; Refill: 6  2. Hair loss - T4, free - TSH - T3  3. Suspected sleep apnea Could explain headaches - Home sleep test; Future  4. Chest pain, unspecified type EKG reveals presence of right bundle branch block which is not new compared to old EKG Given she has a history of diabetes with ongoing chest pain will refer to cardiology for possible stress test - Ambulatory referral to Cardiology  5. Left-sided weakness Symptoms have resolved Will need to exclude possible TIA Risk factor modification - CT HEAD WO CONTRAST ( ); Future  6. Pruritus No rash evident on exam This is present in the region where her bra strap rests on her back - triamcinolone cream (KENALOG) 0.1 %; Apply 1  Application topically 2 (two) times daily.  Dispense: 45 g; Refill: 1  7. Hypertension associated with type 2 diabetes mellitus (HCC) Uncontrolled She endorses adherence with antihypertensives Check blood pressure again at next visit and adjust regimen if still elevated.   Meds  ordered this encounter  Medications   tirzepatide (MOUNJARO) 12.5 MG/0.5ML Pen    Sig: Inject 12.5 mg into the skin once a week. For 4 weeks then increase to 15 mg once a week thereafter    Dispense:  6 mL    Refill:  0    Discontinue Ozempic   tirzepatide (MOUNJARO) 15 MG/0.5ML Pen    Sig: Inject 15 mg into the skin once a week.    Dispense:  6 mL    Refill:  6   triamcinolone cream (KENALOG) 0.1 %    Sig: Apply 1 Application topically 2 (two) times daily.    Dispense:  45 g    Refill:  1    Follow-up: Return in about 1 month (around 02/07/2023) for Pap smear.     Visit required 49 minutes of patient care including median intraservice time, reviewing previous notes and test results, coordination of care, counseling the patient in addition to management of chronic medical conditions.Time also spent ordering medications, investigations and documenting in the chart.  All questions were answered to the patient's satisfaction   Hoy Register, MD, FAAFP. [facility] [place], [place] [phone]   01/08/2023, 12:43 PM

## 2023-01-09 ENCOUNTER — Ambulatory Visit: Payer: Medicaid Other | Attending: Family Medicine

## 2023-01-09 ENCOUNTER — Telehealth: Payer: Self-pay | Admitting: Family Medicine

## 2023-01-09 DIAGNOSIS — L659 Nonscarring hair loss, unspecified: Secondary | ICD-10-CM | POA: Diagnosis not present

## 2023-01-09 MED ORDER — PEN NEEDLES 32G X 4 MM MISC: 6 refills | Status: DC

## 2023-01-09 NOTE — Telephone Encounter (Signed)
Rxn sent for pen needles.

## 2023-01-09 NOTE — Telephone Encounter (Signed)
Medication Refill - Medication: Insulin Pen Needle (PEN NEEDLES) 32G X 4 MM MISC   Has the patient contacted their pharmacy? No.   Preferred Pharmacy (with phone number or street name):  [facility] [phone] - [place], [place] - [address] AT [address] Phone: [phone]  Fax: [phone]      Has the patient been seen for an appointment in the last year OR does the patient have an upcoming appointment? Yes.    The patient states it has been a long while she she needed these. She states she believes it was called the nano needles

## 2023-01-11 LAB — TSH: TSH: 0.777 u[IU]/mL (ref 0.450–4.500)

## 2023-01-11 LAB — T3: T3, Total: 131 ng/dL (ref 71–180)

## 2023-01-11 LAB — T4, FREE: Free T4: 1.25 ng/dL (ref 0.82–1.77)

## 2023-01-16 NOTE — Progress Notes (Unsigned)
Cardiology Office Note:   Date:  01/17/2023  NAME:  Nicole [name]    MRN: [id number] DOB:  03/08/1976   PCP:  Hoy Register, MD  Cardiologist:  None  Electrophysiologist:  None   Referring MD: Hoy Register, MD   Chief Complaint  Patient presents with   Follow-up         History of Present Illness:   Nicole [name] is a 47 y.o. female with a hx of DM, HTN, HLD, obesity who  presents for follow-up.  She reports she is having episodes of chest discomfort.  Described as sharp pain occurs in her left chest and lasts several seconds.  Can have 2 episodes per month.  No identifiable trigger.  No alleviating factors.  I did review her chest CT from 2022.  No coronary calcium.  We discussed this is likely noncardiac.  She did have a CTA of her chest which shows dilated pulmonary artery.  We discussed repeating an echocardiogram to determine if she has pulmonary hypertension.  She does snore.  She has fatigue.  She is going to have a sleep study done by her primary care physician.  BP is 124/82.  She has started [facility].  Morbidly obese with a BMI of 52.  LDL cholesterol could be better.  We discussed increasing her Lipitor.  She would like to hold on this.  She is working on her diabetes.  A1c 9.1.  Denies any significant shortness of breath.  She does have some lower extremity edema but it is improved today.  Problem List DM -A1c 9.1 HTN HLD -T chol 199, HDL 53, LDL 118, TG 161 Morbid obesity -BMI 51 5. Tobacco abuse  6. RBBB  Past Medical History: Past Medical History:  Diagnosis Date   Arthritis    knees   Diabetes mellitus without complication (HCC)    Hyperlipidemia    Hypertension    Ovarian cyst    Pregnancy induced hypertension    Type 2 diabetes mellitus (HCC) 01/08/2018    Past Surgical History: Past Surgical History:  Procedure Laterality Date   APPENDECTOMY     CESAREAN SECTION     HERNIA REPAIR     LAPAROSCOPIC APPENDECTOMY N/A 03/25/2014   Procedure:  APPENDECTOMY LAPAROSCOPIC;  Surgeon: Cherylynn Ridges, MD;  Location: [facility] OR;  Service: General;  Laterality: N/A;    Current Medications: Current Meds  Medication Sig   Accu-Chek Softclix Lancets lancets Use as instructed tid before meals   albuterol (VENTOLIN HFA) 108 (90 Base) MCG/ACT inhaler Inhale 2 puffs into the lungs every 6 (six) hours as needed for wheezing or shortness of breath.   atorvastatin (LIPITOR) 20 MG tablet TAKE 1 TABLET(20 MG) BY MOUTH DAILY   blood glucose meter kit and supplies KIT Dispense based on patient and insurance preference. Use up to four times daily as directed.   budesonide-formoterol (SYMBICORT) 160-4.5 MCG/ACT inhaler Inhale 2 puffs into the lungs daily.   fluconazole (DIFLUCAN) 150 MG tablet Take 150 mg by mouth once.   gabapentin (NEURONTIN) 300 MG capsule Take 1 capsule (300 mg total) by mouth at bedtime.   glucose blood (ACCU-CHEK GUIDE) test strip Use as instructed   glucose blood (FREESTYLE LITE) test strip Use as instructed   hydrochlorothiazide (HYDRODIURIL) 25 MG tablet Take 1 tablet (25 mg total) by mouth daily.   insulin glargine (LANTUS) 100 UNIT/ML Solostar Pen Inject 30 Units into the skin daily.   Insulin Pen Needle (PEN NEEDLES) 32G X 4 MM  MISC Use to inject insulin once daily.   lidocaine (LIDODERM) 5 % Place 1 patch onto the skin daily. Remove & Discard patch within 12 hours or as directed by MD   meloxicam (MOBIC) 7.5 MG tablet Take 1 tablet (7.5 mg total) by mouth daily.   tiotropium (SPIRIVA HANDIHALER) 18 MCG inhalation capsule Place 1 capsule (18 mcg total) into inhaler and inhale daily.   tirzepatide (MOUNJARO) 12.5 MG/0.5ML Pen Inject 12.5 mg into the skin once a week. For 4 weeks then increase to 15 mg once a week thereafter   tirzepatide (MOUNJARO) 15 MG/0.5ML Pen Inject 15 mg into the skin once a week.   topiramate (TOPAMAX) 25 MG tablet Take 1 tablet (25 mg total) by mouth 2 (two) times daily.   triamcinolone cream (KENALOG) 0.1 %  Apply 1 Application topically 2 (two) times daily.   valsartan (DIOVAN) 80 MG tablet Take 1 tablet (80 mg total) by mouth daily.     Allergies:    Patient has no known allergies.   Social History: Social History   Socioeconomic History   Marital status: Single    Spouse name: Not on file   Number of children: 2   Years of education: Not on file   Highest education level: Not on file  Occupational History   Not on file  Tobacco Use   Smoking status: Former    Packs/day: 0.50    Years: 27.00    Additional pack years: 0.00    Total pack years: 13.50    Types: Cigarettes    Quit date: 10/28/2020    Years since quitting: 2.2   Smokeless tobacco: Never   Tobacco comments:    6 cigs daily  Vaping Use   Vaping Use: Never used  Substance and Sexual Activity   Alcohol use: Not Currently    Comment: occ   Drug use: Yes    Frequency: 7.0 times per week    Types: Marijuana   Sexual activity: Not Currently    Comment: 5 years  Other Topics Concern   Not on file  Social History Narrative   Not on file   Social Determinants of Health   Financial Resource Strain: Low Risk  (11/12/2022)   Overall Financial Resource Strain (CARDIA)    Difficulty of Paying Living Expenses: Not very hard  Food Insecurity: No Food Insecurity (11/12/2022)   Hunger Vital Sign    Worried About Running Out of Food in the Last Year: Never true    Ran Out of Food in the Last Year: Never true  Transportation Needs: No Transportation Needs (05/24/2020)   PRAPARE - Administrator, Civil Service (Medical): No    Lack of Transportation (Non-Medical): No  Physical Activity: Inactive (11/12/2022)   Exercise Vital Sign    Days of Exercise per Week: 0 days    Minutes of Exercise per Session: 0 min  Stress: No Stress Concern Present (11/12/2022)   [employer] of Occupational Health - Occupational Stress Questionnaire    Feeling of Stress : Not at all  Social Connections: Moderately Integrated  (11/12/2022)   Social Connection and Isolation Panel [NHANES]    Frequency of Communication with Friends and Family: More than three times a week    Frequency of Social Gatherings with Friends and Family: More than three times a week    Attends Religious Services: 1 to 4 times per year    Active Member of Clubs or Organizations: No    Attends  Banker Meetings: Never    Marital Status: Living with partner     Family History: The patient's family history includes Arthritis in her father; Cancer in her mother; Heart disease in her maternal grandmother; Hypertension in her mother.  ROS:   All other ROS reviewed and negative. Pertinent positives noted in the HPI.     EKGs/Labs/Other Studies Reviewed:   The following studies were personally reviewed by me today:   Recent Labs: 04/21/2022: Hemoglobin 12.7; Platelets 197 07/16/2022: ALT 25; NT-Pro BNP 115 10/08/2022: BUN 16; Creatinine, Ser 0.92; Potassium 5.1; Sodium 141 01/09/2023: TSH 0.777   Recent Lipid Panel    Component Value Date/Time   CHOL 199 11/23/2021 1223   TRIG 161 (H) 11/23/2021 1223   HDL 53 11/23/2021 1223   CHOLHDL 3.8 11/23/2021 1223   CHOLHDL 5.1 02/13/2021 0932   VLDL 26 02/13/2021 0932   LDLCALC 118 (H) 11/23/2021 1223    Physical Exam:   VS:  BP 124/82   Pulse 80   Ht 5\' 8"  (1.727 m)   Wt (!) 345 lb 12.8 oz (156.9 kg)   SpO2 97%   BMI 52.58 kg/m    Wt Readings from Last 3 Encounters:  01/17/23 (!) 345 lb 12.8 oz (156.9 kg)  01/08/23 (!) 345 lb 9.6 oz (156.8 kg)  11/12/22 (!) 342 lb 4.8 oz (155.3 kg)    General: Well nourished, well developed, in no acute distress Head: Atraumatic, normal size  Eyes: PEERLA, EOMI  Neck: Supple, no JVD Endocrine: No thryomegaly Cardiac: Normal S1, S2; RRR; no murmurs, rubs, or gallops Lungs: Clear to auscultation bilaterally, no wheezing, rhonchi or rales  Abd: Soft, nontender, no hepatomegaly  Ext: No edema, pulses 2+ Musculoskeletal: No deformities,  BUE and BLE strength normal and equal Skin: Warm and dry, no rashes   Neuro: Alert and oriented to person, place, time, and situation, CNII-XII grossly intact, no focal deficits  Psych: Normal mood and affect   ASSESSMENT:   Nicole [name] is a 47 y.o. female who presents for the following: 1. Precordial pain   2. Mixed hyperlipidemia   3. Primary hypertension   4. Snoring   5. Other fatigue   6. RBBB     PLAN:   1. Precordial pain -Suspect noncardiac chest pain.  CTA in 2022 with evidence of pulmonary artery dilation suggestive of pulmonary hypertension.  This could be an explanation for chest pain versus noncardiac etiology.  I would like to repeat her echo.  Coronary calcium of 0 based on CT in 2022.  We will start with echo.  2. Mixed hyperlipidemia -Lipids not at goal.  Discussed increasing her Lipitor.  She would like to hold on this for now.  3. Primary hypertension -Well-controlled on current regimen.  4. Snoring 5. Other fatigue 6. RBBB -She does snore.  She has fatigue.  CTA in 2022 with concern for pulmonary artery hypertension.  Repeat echo.  Now with right bundle branch block.  I discussed that she likely needs to proceed with sleep study.  She will reach out to her primary care physician to get this scheduled.      Disposition: Return in about 6 months (around 07/19/2023).  Medication Adjustments/Labs and Tests Ordered: Current medicines are reviewed at length with the patient today.  Concerns regarding medicines are outlined above.  Orders Placed This Encounter  Procedures   ECHOCARDIOGRAM COMPLETE   No orders of the defined types were placed in this encounter.  Patient Instructions  Medication Instructions:  The current medical regimen is effective;  continue present plan and medications.  *If you need a refill on your cardiac medications before your next appointment, please call your pharmacy*   Testing/Procedures:  Echocardiogram - Your physician  has requested that you have an echocardiogram. Echocardiography is a painless test that uses sound waves to create images of your heart. It provides your doctor with information about the size and shape of your heart and how well your heart's chambers and valves are working. This procedure takes approximately one hour. There are no restrictions for this procedure.    Follow-Up: At [facility], you and your health needs are our priority.  As part of our continuing mission to provide you with exceptional heart care, we have created designated Provider Care Teams.  These Care Teams include your primary Cardiologist (physician) and Advanced Practice Providers (APPs -  Physician Assistants and Nurse Practitioners) who all work together to provide you with the care you need, when you need it.  We recommend signing up for the patient portal called "MyChart".  Sign up information is provided on this After Visit Summary.  MyChart is used to connect with patients for Virtual Visits (Telemedicine).  Patients are able to view lab/test results, encounter notes, upcoming appointments, etc.  Non-urgent messages can be sent to your provider as well.   To learn more about what you can do with MyChart, go to [email or web address].    Your next appointment:   6 months  Provider:   Lennie Odor, MD      Time Spent with Patient: I have spent a total of 35 minutes with patient reviewing hospital notes, telemetry, EKGs, labs and examining the patient as well as establishing an assessment and plan that was discussed with the patient.  > 50% of time was spent in direct patient care.  Signed, Lenna Gilford. Flora Lipps, MD, [facility]  [facility]  [address] [place], [place] [address] [phone]  01/17/2023 2:28 PM

## 2023-01-17 ENCOUNTER — Ambulatory Visit: Payer: Medicaid Other | Attending: Cardiovascular Disease | Admitting: Cardiovascular Disease

## 2023-01-17 ENCOUNTER — Encounter: Payer: Self-pay | Admitting: Cardiovascular Disease

## 2023-01-17 VITALS — BP 124/82 | HR 80 | Ht 68.0 in | Wt 345.8 lb

## 2023-01-17 DIAGNOSIS — R072 Precordial pain: Secondary | ICD-10-CM

## 2023-01-17 DIAGNOSIS — R5383 Other fatigue: Secondary | ICD-10-CM | POA: Diagnosis not present

## 2023-01-17 DIAGNOSIS — R0683 Snoring: Secondary | ICD-10-CM

## 2023-01-17 DIAGNOSIS — I4510 Unspecified right bundle-branch block: Secondary | ICD-10-CM

## 2023-01-17 DIAGNOSIS — E782 Mixed hyperlipidemia: Secondary | ICD-10-CM | POA: Diagnosis not present

## 2023-01-17 DIAGNOSIS — I10 Essential (primary) hypertension: Secondary | ICD-10-CM | POA: Diagnosis not present

## 2023-01-17 NOTE — Patient Instructions (Addendum)
Medication Instructions:  The current medical regimen is effective;  continue present plan and medications.  *If you need a refill on your cardiac medications before your next appointment, please call your pharmacy*   Testing/Procedures: Echocardiogram - Your physician has requested that you have an echocardiogram. Echocardiography is a painless test that uses sound waves to create images of your heart. It provides your doctor with information about the size and shape of your heart and how well your heart's chambers and valves are working. This procedure takes approximately one hour. There are no restrictions for this procedure.     Follow-Up: At [facility], you and your health needs are our priority.  As part of our continuing mission to provide you with exceptional heart care, we have created designated Provider Care Teams.  These Care Teams include your primary Cardiologist (physician) and Advanced Practice Providers (APPs -  Physician Assistants and Nurse Practitioners) who all work together to provide you with the care you need, when you need it.  We recommend signing up for the patient portal called "MyChart".  Sign up information is provided on this After Visit Summary.  MyChart is used to connect with patients for Virtual Visits (Telemedicine).  Patients are able to view lab/test results, encounter notes, upcoming appointments, etc.  Non-urgent messages can be sent to your provider as well.   To learn more about what you can do with MyChart, go to [email or web address].    Your next appointment:   6 month(s)  Provider:   [place] O'Neal, MD   

## 2023-01-21 ENCOUNTER — Ambulatory Visit: Payer: Medicaid Other | Admitting: Pharmacist

## 2023-01-22 ENCOUNTER — Ambulatory Visit: Payer: Self-pay | Admitting: *Deleted

## 2023-01-22 ENCOUNTER — Other Ambulatory Visit: Payer: Self-pay | Admitting: Pharmacist

## 2023-01-22 MED ORDER — TIRZEPATIDE 5 MG/0.5ML ~~LOC~~ SOAJ: 5.0000 mg | SUBCUTANEOUS | 0 refills | Status: DC

## 2023-01-22 NOTE — Telephone Encounter (Signed)
Summary: lower med dose,difficulty urinating, nauseous   Pt stated medication, tirzepatide (MOUNJARO) 12.5 MG/0.5ML Pen, is too strong. She stated that she is nauseous, just feels weird, and cannot function. Difficulty urinating but is drinking a lot of water. Pt had an appointment with Franky Macho that was a no-show; she requested to reschedule with him, but virtual only stated she would like the medication dose to be lowered.  Seeking clinical advice. FYI to nurse message has been sent to office to reschedule pt with [facility].           Chief Complaint: medication request to lower dose of mounjaro Symptoms: none now. Reports took mounjaro 12.5 mg last Tuesday and 4 days later sx of difficulty urinating, nausea,feels weird, dizzy lightheaded, headache and blurred vision. Patient reports she does not check blood glucose at home.  Frequency: last Tuesday took mounjaro Pertinent Negatives: Patient denies sx now  Disposition: [] ED /[] Urgent Care (no appt availability in office) / [] Appointment(In office/virtual)/ []  [facility]/ [] Home Care/ [] Refused Recommended Disposition /[] [facility]/ [x]  Follow-up with PCP Additional Notes:  Patient reports she will not take medication again until dose is lowered.  Appt 01/29/23 with Franky Macho due to missed appt. Please advise.      Reason for Disposition  [1] Caller has URGENT medicine question about med that PCP or specialist prescribed AND [2] triager unable to answer question  Answer Assessment - Initial Assessment Questions 1. NAME of MEDICINE: "What medicine(s) are you calling about?"     Mounjaro 12.5 mg 2. QUESTION: "What is your question?" (e.g., double dose of medicine, side effect)     Can medication dose be lowered? 3. PRESCRIBER: "Who prescribed the medicine?" Reason: if prescribed by specialist, call should be referred to that group.     PCP 4. SYMPTOMS: "Do you have any symptoms?" If Yes, ask: "What symptoms are you having?"   "How bad are the symptoms (e.g., mild, moderate, severe)     None now but 4 days after taking mounjaro 12.5 mg sx difficulty urinating, nausea, felt weird, dizzy lightheaded headache blurred vision 5. PREGNANCY:  "Is there any chance that you are pregnant?" "When was your last menstrual period?"     na  Protocols used: Medication Question Call-A-AH

## 2023-01-23 ENCOUNTER — Other Ambulatory Visit: Payer: Self-pay

## 2023-01-27 ENCOUNTER — Other Ambulatory Visit: Payer: Self-pay | Admitting: Family Medicine

## 2023-01-27 DIAGNOSIS — R600 Localized edema: Secondary | ICD-10-CM

## 2023-01-27 DIAGNOSIS — E1159 Type 2 diabetes mellitus with other circulatory complications: Secondary | ICD-10-CM

## 2023-01-29 ENCOUNTER — Ambulatory Visit: Payer: Medicaid Other | Attending: Family Medicine | Admitting: Pharmacist

## 2023-01-29 DIAGNOSIS — Z7985 Long-term (current) use of injectable non-insulin antidiabetic drugs: Secondary | ICD-10-CM

## 2023-01-29 DIAGNOSIS — E1142 Type 2 diabetes mellitus with diabetic polyneuropathy: Secondary | ICD-10-CM

## 2023-01-29 NOTE — Telephone Encounter (Signed)
Requested Prescriptions  Pending Prescriptions Disp Refills   hydrochlorothiazide (HYDRODIURIL) 25 MG tablet [Pharmacy Med Name: HYDROCHLOROTHIAZIDE 25MG TABLETS] 90 tablet 0    Sig: TAKE 1 TABLET(25 MG) BY MOUTH DAILY     Cardiovascular: Diuretics - Thiazide Passed - 01/27/2023  3:09 AM      Passed - Cr in normal range and within 180 days    Creat  Date Value Ref Range Status  09/06/2016 0.88 0.50 - 1.10 mg/dL Final   Creatinine, Ser  Date Value Ref Range Status  10/08/2022 0.92 0.57 - 1.00 mg/dL Final   Creatinine, Urine  Date Value Ref Range Status  11/18/2007 81.6  Final         Passed - K in normal range and within 180 days    Potassium  Date Value Ref Range Status  10/08/2022 5.1 3.5 - 5.2 mmol/L Final         Passed - Na in normal range and within 180 days    Sodium  Date Value Ref Range Status  10/08/2022 141 134 - 144 mmol/L Final         Passed - Last BP in normal range    BP Readings from Last 1 Encounters:  01/17/23 124/82         Passed - Valid encounter within last 6 months    Recent Outpatient Visits           3 weeks ago Type 2 diabetes mellitus with peripheral neuropathy (HCC)   [place] [facility] [place], [place], MD   1 month ago Type 2 diabetes mellitus with peripheral neuropathy [facility])   [place] [facility] [place], Jeannett Senior L, RPH-CPP   2 months ago Type 2 diabetes mellitus with peripheral neuropathy [facility])   [place] [facility] [place], Vincent L, RPH-CPP   3 months ago Type 2 diabetes mellitus with peripheral neuropathy [facility])   [place] [facility] [place], Donaldson, MD   6 months ago Type 2 diabetes mellitus with peripheral neuropathy [facility])   [place] [facility] Hoy Register, MD       Future Appointments             Today Lois Huxley, Cornelius Moras, RPH-CPP  [facility]    In 1 week Hoy Register, MD [facility]   In 5 months O'Neal, Ronnald Ramp, MD [facility]

## 2023-01-29 NOTE — Progress Notes (Signed)
01/29/2023 Name: Nicole [name] MRN: [id number] DOB: 21-Jan-1976  No chief complaint on file.   Nicole [name] is a 47 y.o. year old female who presented for a telephone visit. I connected with  Nicole [name] on 01/29/23 by a video enabled telemedicine application and verified that I am speaking with the correct person using two identifiers.   I discussed the limitations of evaluation and management by telemedicine. The patient expressed understanding and agreed to proceed.  They were referred to the pharmacist by their PCP for assistance in managing diabetes.    Subjective:  Care Team: Primary Care Provider: Hoy Register, MD ; Next Scheduled Visit: 02/11/2023  Medication Access/Adherence  Current Pharmacy:  [facility] [phone] Ginette Otto, [place] - [address] AT [facility] [address] [facility] [facility] [address] [place] [address] Phone: [phone] Fax: [phone]  [facility] - [facility] [address], [address] [place] [address] Phone: [phone] Fax: [phone]   Patient reports affordability concerns with their medications: No  Patient reports access/transportation concerns to their pharmacy: No  Patient reports adherence concerns with their medications:  No     Diabetes:  Current medications:  Medications tried in the past:   Current glucose readings: not checking   Patient reports hypoglycemic s/sx including: dizziness, sweating. Cannot confirm if patient is having hypoglycemia. She says this occurs infrequently.  Patient denies hyperglycemic symptoms including: polyuria, polydipsia, polyphagia, nocturia, neuropathy, blurred vision.  Current meal patterns:  -Pt endorses decreased appetite with Mounjaro but experienced nausea on the 12.5 mg weekly dose. She did not try a decreased dose.   Current physical activity:  -Not covered during this visit  Insurance: [place]  Medicaid   Objective:  Lab Results  Component Value Date   HGBA1C 9.1 (A) 01/08/2023    Lab Results  Component Value Date   CREATININE 0.92 10/08/2022   BUN 16 10/08/2022   NA 141 10/08/2022   K 5.1 10/08/2022   CL 102 10/08/2022   CO2 23 10/08/2022    Lab Results  Component Value Date   CHOL 199 11/23/2021   HDL 53 11/23/2021   LDLCALC 118 (H) 11/23/2021   TRIG 161 (H) 11/23/2021   CHOLHDL 3.8 11/23/2021    Medications Reviewed Today     Reviewed by Sande Rives, MD (Physician) on 01/17/23 at 1426  Med List Status: <None>   Medication Order Taking? Sig Documenting Provider Last Dose Status Informant  Accu-Chek Softclix Lancets lancets [id number] Yes Use as instructed tid before meals Hoy Register, MD Taking Active   albuterol (VENTOLIN HFA) 108 (90 Base) MCG/ACT inhaler [id number] Yes Inhale 2 puffs into the lungs every 6 (six) hours as needed for wheezing or shortness of breath. Hoy Register, MD Taking Active Self  atorvastatin (LIPITOR) 20 MG tablet [id number] Yes TAKE 1 TABLET(20 MG) BY MOUTH DAILY Hoy Register, MD Taking Active   blood glucose meter kit and supplies KIT [id number] Yes Dispense based on patient and insurance preference. Use up to four times daily as directed. Azucena Fallen, MD Taking Active   budesonide-formoterol [facility]) 160-4.5 MCG/ACT inhaler [id number] Yes Inhale 2 puffs into the lungs daily. Merrilee Jansky, MD Taking Active   fluconazole (DIFLUCAN) 150 MG tablet [id number] Yes Take 150 mg by mouth once. [provider] Taking Active   gabapentin (NEURONTIN) 300 MG capsule [id number] Yes Take 1 capsule (300 mg total) by mouth at bedtime. Newlin, Enobong,  MD Taking Active   glucose blood (ACCU-CHEK GUIDE) test strip [id number] Yes Use as instructed Marcine Matar, MD Taking Active   glucose blood (FREESTYLE LITE) test strip [id number] Yes Use as instructed Franne Forts, DO Taking Active   hydrochlorothiazide  (HYDRODIURIL) 25 MG tablet [id number] Yes Take 1 tablet (25 mg total) by mouth daily. Hoy Register, MD Taking Active   insulin glargine (LANTUS) 100 UNIT/ML Solostar Pen [id number] Yes Inject 30 Units into the skin daily. Hoy Register, MD Taking Active   Insulin Pen Needle (PEN NEEDLES) 32G X 4 MM MISC [id number] Yes Use to inject insulin once daily. Hoy Register, MD Taking Active   lidocaine (LIDODERM) 5 % [id number] Yes Place 1 patch onto the skin daily. Remove & Discard patch within 12 hours or as directed by MD Hoy Register, MD Taking Active   meloxicam (MOBIC) 7.5 MG tablet [id number] Yes Take 1 tablet (7.5 mg total) by mouth daily. Hoy Register, MD Taking Active     Discontinued 05/02/21 0932 (Reorder)   tiotropium (SPIRIVA HANDIHALER) 18 MCG inhalation capsule [id number] Yes Place 1 capsule (18 mcg total) into inhaler and inhale daily. Hoy Register, MD Taking Active Self  tirzepatide [facility]) 12.5 MG/0.5ML Pen [id number] Yes Inject 12.5 mg into the skin once a week. For 4 weeks then increase to 15 mg once a week thereafter Hoy Register, MD Taking Active   tirzepatide [facility]) 15 MG/0.5ML Pen [id number] Yes Inject 15 mg into the skin once a week. Hoy Register, MD Taking Active   topiramate (TOPAMAX) 25 MG tablet [id number] Yes Take 1 tablet (25 mg total) by mouth 2 (two) times daily. Hoy Register, MD Taking Active   triamcinolone cream (KENALOG) 0.1 % [id number] Yes Apply 1 Application topically 2 (two) times daily. Hoy Register, MD Taking Active   valsartan (DIOVAN) 80 MG tablet [id number] Yes Take 1 tablet (80 mg total) by mouth daily. Hoy Register, MD Taking Active               Assessment/Plan:   Diabetes: - Currently uncontrolled - Reviewed long term cardiovascular and renal outcomes of uncontrolled blood sugar - Reviewed goal A1c, goal fasting, and goal 2 hour post prandial glucose - Recommend to restart Mounjaro at the 5mg  weekly dose. Pt is willing  to retry this dose.  - Recommend to continue Lantus to 30u daily.   - Recommend to check glucose at least daily.  - Next A1c anticipated 03/2023.   Follow Up Plan: w/ PCP in 2 weeks.   Butch Penny, PharmD, Patsy Baltimore, CPP Clinical Pharmacist [facility] [phone]

## 2023-02-01 ENCOUNTER — Telehealth: Payer: Self-pay | Admitting: Family Medicine

## 2023-02-01 ENCOUNTER — Other Ambulatory Visit: Payer: Self-pay

## 2023-02-01 ENCOUNTER — Ambulatory Visit
Admission: RE | Admit: 2023-02-01 | Discharge: 2023-02-01 | Disposition: A | Discharge status: Home / Self Care | Payer: Medicaid Other | Source: Ambulatory Visit | Attending: Family Medicine | Admitting: Family Medicine

## 2023-02-01 ENCOUNTER — Other Ambulatory Visit: Payer: Self-pay | Admitting: Pharmacist

## 2023-02-01 DIAGNOSIS — R531 Weakness: Secondary | ICD-10-CM

## 2023-02-01 MED ORDER — TIRZEPATIDE 2.5 MG/0.5ML ~~LOC~~ SOAJ: 2.5000 mg | SUBCUTANEOUS | 0 refills | Status: DC

## 2023-02-01 NOTE — Telephone Encounter (Signed)
VM left informing patient that medication has been sent.

## 2023-02-01 NOTE — Telephone Encounter (Signed)
Pt is calling in because she states that [facility] told her he was going to send in a prescription for Mounjaro 2.5 MG to the Pharmacy but it hasn't been sent in. Pt said she called the pharmacy and the medication hadn't been sent in. Please advise.

## 2023-02-01 NOTE — Telephone Encounter (Signed)
Is patient to take 5mg  or 2.5 mg?

## 2023-02-04 ENCOUNTER — Other Ambulatory Visit: Payer: Self-pay

## 2023-02-11 ENCOUNTER — Encounter: Payer: Self-pay | Admitting: Family Medicine

## 2023-02-11 ENCOUNTER — Other Ambulatory Visit (HOSPITAL_COMMUNITY): Admission: RE | Admit: 2023-02-11 | Payer: Medicaid Other | Source: Ambulatory Visit

## 2023-02-11 ENCOUNTER — Other Ambulatory Visit: Payer: Self-pay

## 2023-02-11 ENCOUNTER — Ambulatory Visit: Payer: Medicaid Other | Attending: Family Medicine | Admitting: Family Medicine

## 2023-02-11 VITALS — BP 133/85 | HR 80 | Temp 98.0°F | Ht 68.0 in | Wt 342.0 lb

## 2023-02-11 DIAGNOSIS — Z113 Encounter for screening for infections with a predominantly sexual mode of transmission: Secondary | ICD-10-CM | POA: Insufficient documentation

## 2023-02-11 DIAGNOSIS — Z1151 Encounter for screening for human papillomavirus (HPV): Secondary | ICD-10-CM

## 2023-02-11 DIAGNOSIS — Z6841 Body Mass Index (BMI) 40.0 and over, adult: Secondary | ICD-10-CM | POA: Diagnosis not present

## 2023-02-11 DIAGNOSIS — Z124 Encounter for screening for malignant neoplasm of cervix: Secondary | ICD-10-CM

## 2023-02-11 DIAGNOSIS — Z01411 Encounter for gynecological examination (general) (routine) with abnormal findings: Secondary | ICD-10-CM | POA: Diagnosis not present

## 2023-02-11 DIAGNOSIS — Z0001 Encounter for general adult medical examination with abnormal findings: Secondary | ICD-10-CM | POA: Diagnosis not present

## 2023-02-11 DIAGNOSIS — N941 Unspecified dyspareunia: Secondary | ICD-10-CM | POA: Diagnosis not present

## 2023-02-11 DIAGNOSIS — E6609 Other obesity due to excess calories: Secondary | ICD-10-CM

## 2023-02-11 DIAGNOSIS — Z1211 Encounter for screening for malignant neoplasm of colon: Secondary | ICD-10-CM

## 2023-02-11 MED ORDER — ESTRADIOL 0.1 MG/GM VA CREA
1.0000 | TOPICAL_CREAM | Freq: Every day | VAGINAL | 0 refills | Status: DC
  Filled 2023-02-11 (×2): qty 42.5, 30d supply, fill #0

## 2023-02-11 MED ORDER — PREMARIN 0.625 MG/GM VA CREA
1.0000 | TOPICAL_CREAM | VAGINAL | 0 refills | Status: DC
Start: 2023-02-11 — End: 2023-12-02
  Filled 2023-02-11: qty 30, 90d supply, fill #0

## 2023-02-11 NOTE — Progress Notes (Signed)
Pain during intercourse

## 2023-02-11 NOTE — Progress Notes (Signed)
Subjective:  Patient ID: Nicole [name], female    DOB: 01/24/1976  Age: 47 y.o. MRN: [id number]  CC: Gynecologic Exam   HPI Nicole [name] is a 47 y.o. year old female with a history of morbid obesity, Type 2 DM (A1c 9.1), diabetic neuropathy osteoarthritis of the knee, tobacco abuse. Interval History: Discussed the use of AI scribe software for clinical note transcription with the patient, who gave verbal consent to proceed.   The patient, a post-menopausal individual, presents with pain during intercourse for the past couple of months. She describes the pain as being at the initiation of penetration, not deep, and located 'up top' internally. She has tried using a lubricant called Refresh with limited success. She also reports vaginal dryness. She has not experienced this pain prior to the onset a couple of months ago.  The patient also mentions a decrease in sexual desire, which she attributes to menopause. She has not had her periods for some time.  The patient also mentions a missed mammogram appointment due to scheduling conflicts and forgetfulness. She expresses concern about the cost of the mammogram.  She has sometimes noticed shooting pain in her breast.       Past Medical History:  Diagnosis Date   Arthritis    knees   Diabetes mellitus without complication (HCC)    Hyperlipidemia    Hypertension    Ovarian cyst    Pregnancy induced hypertension    Type 2 diabetes mellitus (HCC) 01/08/2018    Past Surgical History:  Procedure Laterality Date   APPENDECTOMY     CESAREAN SECTION     HERNIA REPAIR     LAPAROSCOPIC APPENDECTOMY N/A 03/25/2014   Procedure: APPENDECTOMY LAPAROSCOPIC;  Surgeon: Cherylynn Ridges, MD;  Location: [facility] OR;  Service: General;  Laterality: N/A;    Family History  Problem Relation Age of Onset   Hypertension Mother    Cancer Mother        pancreatic cancer   Arthritis Father    Heart disease Maternal Grandmother     Social History    Socioeconomic History   Marital status: Single    Spouse name: Not on file   Number of children: 2   Years of education: Not on file   Highest education level: Not on file  Occupational History   Not on file  Tobacco Use   Smoking status: Former    Current packs/day: 0.00    Average packs/day: 0.5 packs/day for 27.0 years (13.5 ttl pk-yrs)    Types: Cigarettes    Start date: 10/28/1993    Quit date: 10/28/2020    Years since quitting: 2.2   Smokeless tobacco: Never   Tobacco comments:    6 cigs daily  Vaping Use   Vaping status: Never Used  Substance and Sexual Activity   Alcohol use: Not Currently    Comment: occ   Drug use: Yes    Frequency: 7.0 times per week    Types: Marijuana   Sexual activity: Not Currently    Comment: 5 years  Other Topics Concern   Not on file  Social History Narrative   Not on file   Social Determinants of Health   Financial Resource Strain: Low Risk  (11/12/2022)   Overall Financial Resource Strain (CARDIA)    Difficulty of Paying Living Expenses: Not very hard  Food Insecurity: No Food Insecurity (11/12/2022)   Hunger Vital Sign    Worried About Running Out of Food in the  Last Year: Never true    Ran Out of Food in the Last Year: Never true  Transportation Needs: No Transportation Needs (05/24/2020)   PRAPARE - Administrator, Civil Service (Medical): No    Lack of Transportation (Non-Medical): No  Physical Activity: Inactive (11/12/2022)   Exercise Vital Sign    Days of Exercise per Week: 0 days    Minutes of Exercise per Session: 0 min  Stress: No Stress Concern Present (11/12/2022)   [employer] of Occupational Health - Occupational Stress Questionnaire    Feeling of Stress : Not at all  Social Connections: Moderately Integrated (11/12/2022)   Social Connection and Isolation Panel [NHANES]    Frequency of Communication with Friends and Family: More than three times a week    Frequency of Social Gatherings with  Friends and Family: More than three times a week    Attends Religious Services: 1 to 4 times per year    Active Member of [employer] or Organizations: No    Attends Banker Meetings: Never    Marital Status: Living with partner    No Known Allergies  Outpatient Medications Prior to Visit  Medication Sig Dispense Refill   Accu-Chek Softclix Lancets lancets Use as instructed tid before meals 100 each 12   albuterol (VENTOLIN HFA) 108 (90 Base) MCG/ACT inhaler Inhale 2 puffs into the lungs every 6 (six) hours as needed for wheezing or shortness of breath. 18 g 6   atorvastatin (LIPITOR) 20 MG tablet TAKE 1 TABLET(20 MG) BY MOUTH DAILY 90 tablet 1   blood glucose meter kit and supplies KIT Dispense based on patient and insurance preference. Use up to four times daily as directed. 1 each 0   budesonide-formoterol (SYMBICORT) 160-4.5 MCG/ACT inhaler Inhale 2 puffs into the lungs daily. 1 each 0   fluconazole (DIFLUCAN) 150 MG tablet Take 150 mg by mouth once.     gabapentin (NEURONTIN) 300 MG capsule Take 1 capsule (300 mg total) by mouth at bedtime. 30 capsule 3   glucose blood (ACCU-CHEK GUIDE) test strip Use as instructed 100 each 12   glucose blood (FREESTYLE LITE) test strip Use as instructed 100 each 12   hydrochlorothiazide (HYDRODIURIL) 25 MG tablet TAKE 1 TABLET(25 MG) BY MOUTH DAILY 90 tablet 0   insulin glargine (LANTUS) 100 UNIT/ML Solostar Pen Inject 30 Units into the skin daily. 15 mL 3   Insulin Pen Needle (PEN NEEDLES) 32G X 4 MM MISC Use to inject insulin once daily. 100 each 6   lidocaine (LIDODERM) 5 % Place 1 patch onto the skin daily. Remove & Discard patch within 12 hours or as directed by MD 30 patch 1   meloxicam (MOBIC) 7.5 MG tablet Take 1 tablet (7.5 mg total) by mouth daily. 30 tablet 1   tiotropium (SPIRIVA HANDIHALER) 18 MCG inhalation capsule Place 1 capsule (18 mcg total) into inhaler and inhale daily. 30 capsule 6   tirzepatide (MOUNJARO) 2.5 MG/0.5ML Pen  Inject 2.5 mg into the skin once a week. 2 mL 0   tirzepatide (MOUNJARO) 5 MG/0.5ML Pen Inject 5 mg into the skin once a week. 6 mL 0   topiramate (TOPAMAX) 25 MG tablet Take 1 tablet (25 mg total) by mouth 2 (two) times daily. 60 tablet 3   triamcinolone cream (KENALOG) 0.1 % Apply 1 Application topically 2 (two) times daily. 45 g 1   valsartan (DIOVAN) 80 MG tablet Take 1 tablet (80 mg total) by mouth daily. 90  tablet 1   No facility-administered medications prior to visit.     ROS Review of Systems  Constitutional:  Negative for activity change, appetite change and fatigue.  HENT:  Negative for congestion, sinus pressure and sore throat.   Eyes:  Negative for visual disturbance.  Respiratory:  Negative for cough, chest tightness, shortness of breath and wheezing.   Cardiovascular:  Negative for chest pain and palpitations.  Gastrointestinal:  Negative for abdominal distention, abdominal pain and constipation.  Endocrine: Negative for polydipsia.  Genitourinary:  Negative for dysuria and frequency.  Musculoskeletal:  Negative for arthralgias and back pain.  Skin:  Negative for rash.  Neurological:  Negative for tremors, light-headedness and numbness.  Hematological:  Does not bruise/bleed easily.  Psychiatric/Behavioral:  Negative for agitation and behavioral problems.     Objective:  BP 133/85   Pulse 80   Temp 98 F (36.7 C) (Oral)   Ht 5\' 8"  (1.727 m)   Wt (!) 342 lb (155.1 kg)   SpO2 100%   BMI 52.00 kg/m      02/11/2023    2:20 PM 01/17/2023    1:50 PM 01/08/2023   12:35 PM  BP/Weight  Systolic BP 133 124 161  Diastolic BP 85 82 91  Wt. (Lbs) 342 345.8   BMI 52 kg/m2 52.58 kg/m2       Physical Exam Exam conducted with a chaperone present.  Constitutional:      General: She is not in acute distress.    Appearance: She is well-developed. She is not diaphoretic.  HENT:     Head: Normocephalic.     Right Ear: External ear normal.     Left Ear: External ear  normal.     Nose: Nose normal.  Eyes:     Conjunctiva/sclera: Conjunctivae normal.     Pupils: Pupils are equal, round, and reactive to light.  Neck:     Vascular: No JVD.  Cardiovascular:     Rate and Rhythm: Normal rate and regular rhythm.     Heart sounds: Normal heart sounds. No murmur heard.    No gallop.  Pulmonary:     Effort: Pulmonary effort is normal. No respiratory distress.     Breath sounds: Normal breath sounds. No wheezing or rales.  Chest:     Chest wall: No tenderness.  Breasts:    Right: Normal. No mass, nipple discharge or tenderness.     Left: Normal. No mass, nipple discharge or tenderness.  Abdominal:     General: Bowel sounds are normal. There is no distension.     Palpations: Abdomen is soft. There is no mass.     Tenderness: There is no abdominal tenderness.     Hernia: There is no hernia in the left inguinal area or right inguinal area.  Genitourinary:    General: Normal vulva.     Pubic Area: No rash.      Labia:        Right: No rash.        Left: No rash.      Vagina: Normal.     Cervix: Normal.     Uterus: Normal.      Adnexa: Right adnexa normal and left adnexa normal.       Right: No tenderness.         Left: No tenderness.    Musculoskeletal:        General: No tenderness. Normal range of motion.     Cervical back: Normal range of  motion. No tenderness.  Lymphadenopathy:     Upper Body:     Right upper body: No supraclavicular or axillary adenopathy.     Left upper body: No supraclavicular or axillary adenopathy.  Skin:    General: Skin is warm and dry.  Neurological:     Mental Status: She is alert and oriented to person, place, and time.     Deep Tendon Reflexes: Reflexes are normal and symmetric.        Latest Ref Rng & Units 10/08/2022    1:45 PM 07/16/2022   11:30 AM 04/21/2022    1:13 PM  CMP  Glucose 70 - 99 mg/dL 347  425  956   BUN 6 - 24 mg/dL 16  14  11    Creatinine 0.57 - 1.00 mg/dL 3.87  5.64  3.32   Sodium 134 -  144 mmol/L 141  135  135   Potassium 3.5 - 5.2 mmol/L 5.1  5.0  3.9   Chloride 96 - 106 mmol/L 102  99  100   CO2 20 - 29 mmol/L 23  23  25    Calcium 8.7 - 10.2 mg/dL 9.6  9.5  9.4   Total Protein 6.0 - 8.5 g/dL  6.9    Total Bilirubin 0.0 - 1.2 mg/dL  <9.5    Alkaline Phos 44 - 121 IU/L  140    AST 0 - 40 IU/L  20    ALT 0 - 32 IU/L  25      Lipid Panel     Component Value Date/Time   CHOL 199 11/23/2021 1223   TRIG 161 (H) 11/23/2021 1223   HDL 53 11/23/2021 1223   CHOLHDL 3.8 11/23/2021 1223   CHOLHDL 5.1 02/13/2021 0932   VLDL 26 02/13/2021 0932   LDLCALC 118 (H) 11/23/2021 1223    CBC    Component Value Date/Time   WBC 7.2 04/21/2022 1313   RBC 4.82 04/21/2022 1313   HGB 12.7 04/21/2022 1313   HGB 12.6 02/28/2021 1629   HCT 39.3 04/21/2022 1313   HCT 38.4 02/28/2021 1629   PLT 197 04/21/2022 1313   PLT 265 02/28/2021 1629   MCV 81.5 04/21/2022 1313   MCV 82 02/28/2021 1629   MCH 26.3 04/21/2022 1313   MCHC 32.3 04/21/2022 1313   RDW 13.9 04/21/2022 1313   RDW 13.4 02/28/2021 1629   LYMPHSABS 0.4 (L) 04/21/2022 1313   LYMPHSABS 2.8 02/28/2021 1629   MONOABS 1.0 04/21/2022 1313   EOSABS 0.1 04/21/2022 1313   EOSABS 0.2 02/28/2021 1629   BASOSABS 0.0 04/21/2022 1313   BASOSABS 0.1 02/28/2021 1629    Lab Results  Component Value Date   HGBA1C 9.1 (A) 01/08/2023    Assessment & Plan:  Annual visit for general medical conditions: Anticipatory guidance, Counseled on 150 minutes of exercise per week, healthy eating (including decreased daily intake of saturated fats, cholesterol, added sugars, sodium), routine healthcare maintenance.     Dyspareunia: Pain during intercourse for the past couple of months, likely secondary to vaginal dryness due to menopause. Lubricants have been tried with limited success. Pain is localized to the upper part of the vagina, near the cervix and uterus, and is not associated with deep penetration. -Prescribe topical estrogen cream  to be applied twice a week.  Initially wrote prescription for Estrace however insurance does not cover this and so it was switched to Premarin.  Consider adding progesterone if she requires this cream for greater than 1 month  Breast Pain: Intermittent sharp pain in the breasts. No mammogram has been done recently.  Order being placed 7 months ago.  She has been encouraged to follow-up with this  Pap Smear and STD Screening: Due for routine Pap smear and STD screening. -Perform Pap smear and STD screening today. Results will be reviewed and communicated to the patient.  General Health Maintenance -Encourage patient to prioritize health screenings and preventive care.           Meds ordered this encounter  Medications   DISCONTD: estradiol (ESTRACE VAGINAL) 0.1 MG/GM vaginal cream    Sig: Place 1 Applicatorful vaginally at bedtime. For 1 week then decrease to 1 applicator twice a week for 4 weeks    Dispense:  42.5 g    Refill:  0   conjugated estrogens (PREMARIN) vaginal cream    Sig: Place 1 Applicatorful vaginally 2 (two) times a week. For 4 weeks    Dispense:  42.5 g    Refill:  0    Follow-up: Return in about 3 months (around 05/14/2023) for Chronic medical conditions.       Hoy Register, MD, FAAFP. [facility] [place], [place] [phone]   02/11/2023, 5:18 PM

## 2023-02-11 NOTE — Patient Instructions (Signed)
Estradiol Vaginal Cream What is this medication? ESTRADIOL (es tra DYE ole) reduces vaginal irritation, dryness, and pain during sex due to menopause. It is an estrogen hormone. This medicine may be used for other purposes; ask your health care provider or pharmacist if you have questions. COMMON BRAND NAME(S): Estrace What should I tell my care team before I take this medication? They need to know if you have any of these conditions: Abnormal vaginal bleeding Blood vessel disease or blood clots Breast, cervical, endometrial, ovarian, liver, or uterine cancer Dementia Diabetes Gallbladder disease Heart disease or recent heart attack High blood pressure High cholesterol High levels of calcium in the blood Hysterectomy Kidney disease Liver disease Migraine headaches Protein C/S deficiency Stroke Systemic lupus erythematosus (SLE) Tobacco use An unusual or allergic reaction to estrogens, soy, other medications, foods, dyes, or preservatives Pregnant or trying to get pregnant Breast-feeding How should I use this medication? This medication is for use in the vagina only. Do not take by mouth. Follow the directions on the prescription label. Read package directions carefully before using. Use the special applicator supplied with the cream. Wash hands before and after use. Fill the applicator with the prescribed amount of cream. Lie on your back, part and bend your knees. Insert the applicator into the vagina and push the plunger to expel the cream into the vagina. Wash the applicator with warm soapy water and rinse well. Use exactly as directed for the complete length of time prescribed. Do not stop using except on the advice of your care team. A patient package insert for the product will be given with each prescription and refill. Read this sheet carefully each time. The sheet may change frequently. Talk to your care team about the use of this medication in children. This medication is not  approved for use in children. Overdosage: If you think you have taken too much of this medicine contact a poison control center or emergency room at once. NOTE: This medicine is only for you. Do not share this medicine with others. What if I miss a dose? If you miss a dose, use it as soon as you can. If it is almost time for your next dose, use only that dose. Do not use double or extra doses. What may interact with this medication? Do not take this medication with any of the following: Aromatase inhibitors like aminoglutethimide, anastrozole, exemestane, letrozole, testolactone This medication may also interact with the following: Barbiturates used for inducing sleep or treating seizures Carbamazepine Grapefruit juice Medications for fungal infections like ketoconazole and itraconazole Raloxifene Rifabutin Rifampin Rifapentine Ritonavir Some antibiotics used to treat infections St. John's Wort Tamoxifen Warfarin This list may not describe all possible interactions. Give your health care provider a list of all the medicines, herbs, non-prescription drugs, or dietary supplements you use. Also tell them if you smoke, drink alcohol, or use illegal drugs. Some items may interact with your medicine. What should I watch for while using this medication? Visit your care team for regular checks on your progress. You will need a regular breast and pelvic exam. You should also discuss the need for regular mammograms with your care team, and follow their guidelines. This medication can make your body retain fluid, making your fingers, hands, or ankles swell. Your blood pressure can go up. Contact your care team if you feel you are retaining fluid. If you have any reason to think you are pregnant, stop taking this medication at once and contact your care team.  Smoking tobacco increases the risk of getting a blood clot or having a stroke while you are taking this medication, especially if you are older  than 35 years. If you wear contact lenses and notice visual changes, or if the lenses begin to feel uncomfortable, consult your eye care specialist. If you are going to have elective surgery, you may need to stop taking this medication beforehand. Consult your care team for advice prior to scheduling the surgery. What side effects may I notice from receiving this medication? Side effects that you should report to your care team as soon as possible: Allergic reactions--skin rash, itching, hives, swelling of the face, lips, tongue, or throat Blood clot--pain, swelling, or warmth in the leg, shortness of breath, chest pain Breast tissue changes, new lumps, redness, pain, or discharge from the nipple Gallbladder problems--severe stomach pain, nausea, vomiting, fever Increase in blood pressure Liver injury--right upper belly pain, loss of appetite, nausea, light-colored stool, dark yellow or brown urine, yellowing skin or eyes, unusual weakness or fatigue Stroke--sudden numbness or weakness of the face, arm, or leg, trouble speaking, confusion, trouble walking, loss of balance or coordination, dizziness, severe headache, change in vision Unusual vaginal discharge, itching, or odor Vaginal bleeding after menopause, pelvic pain Side effects that usually do not require medical attention (report to your care team if they continue or are bothersome): Bloating Breast pain or tenderness Nausea Vaginal irritation at application site Vomiting This list may not describe all possible side effects. Call your doctor for medical advice about side effects. You may report side effects to FDA at 1-800-FDA-1088. Where should I keep my medication? Keep out of the reach of children and pets. Store at room temperature between 15 and 30 degrees C (59 and 86 degrees F). Protect from temperatures above 40 degrees C (104 degrees C). Do not freeze. Throw away any unused medication after the expiration date. NOTE: This  sheet is a summary. It may not cover all possible information. If you have questions about this medicine, talk to your doctor, pharmacist, or health care provider.  2024 Elsevier/Gold Standard (2021-03-24 00:00:00)

## 2023-02-12 ENCOUNTER — Other Ambulatory Visit: Payer: Self-pay

## 2023-02-12 ENCOUNTER — Ambulatory Visit (HOSPITAL_COMMUNITY): Payer: Medicaid Other | Attending: Cardiovascular Disease

## 2023-02-12 DIAGNOSIS — R072 Precordial pain: Secondary | ICD-10-CM | POA: Diagnosis not present

## 2023-02-12 LAB — CERVICOVAGINAL ANCILLARY ONLY
Bacterial Vaginitis (gardnerella): NEGATIVE
Candida Glabrata: NEGATIVE
Candida Vaginitis: NEGATIVE
Chlamydia: NEGATIVE
Comment: NEGATIVE
Comment: NEGATIVE
Comment: NEGATIVE
Comment: NEGATIVE
Comment: NEGATIVE
Comment: NORMAL
Neisseria Gonorrhea: NEGATIVE
Trichomonas: POSITIVE — AB

## 2023-02-12 LAB — ECHOCARDIOGRAM COMPLETE
Area-P 1/2: 2.76 cm2
S' Lateral: 3.2 cm

## 2023-02-15 ENCOUNTER — Ambulatory Visit: Payer: Self-pay | Admitting: *Deleted

## 2023-02-15 ENCOUNTER — Other Ambulatory Visit: Payer: Self-pay

## 2023-02-15 LAB — CYTOLOGY - PAP
Adequacy: ABSENT
Comment: NEGATIVE
Diagnosis: NEGATIVE
Diagnosis: REACTIVE
High risk HPV: NEGATIVE

## 2023-02-15 NOTE — Telephone Encounter (Signed)
Reason for Disposition  Prescription request for new medicine (not a refill)  Answer Assessment - Initial Assessment Questions 1. NAME of MEDICINE: "What medicine(s) are you calling about?"     Metronidazole  2. QUESTION: "What is your question?" (e.g., double dose of medicine, side effect)     Patient states she was diagnosed with Trich and advised Rx would be sent to pharmacy- never sent 3. PRESCRIBER: "Who prescribed the medicine?" Reason: if prescribed by specialist, call should be referred to that group.     PCP 4. SYMPTOMS: "Do you have any symptoms?" If Yes, ask: "What symptoms are you having?"  "How bad are the symptoms (e.g., mild, moderate, severe)     Trich infection- see lab 02/11/23 lab Hello, Your vaginal swab is positive for trichomonas which is an STD.  I have sent a prescription for metronidazole to your pharmacy.  You will need to repeat test in 3 months and your partner will also need to be treated.  Protocols used: Medication Question Call-A-AH

## 2023-02-15 NOTE — Telephone Encounter (Signed)
  Chief Complaint: medication request Symptoms:  Hello, Your vaginal swab is positive for trichomonas which is an STD.  I have sent a prescription for metronidazole to your pharmacy.  You will need to repeat test in 3 months and your partner will also need to be treated.  Disposition: [] ED /[] Urgent Care (no appt availability in office) / [] Appointment(In office/virtual)/ []  [facility]/ [] Home Care/ [] Refused Recommended Disposition /[] [place]/ [x]  Follow-up with PCP Additional Notes: Patient states the Rx was never sent to pharmacy- please send ASAP- [facility] [place]

## 2023-02-18 MED ORDER — METRONIDAZOLE 500 MG PO TABS: 500.0000 mg | ORAL_TABLET | Freq: Two times a day (BID) | ORAL | 0 refills | Status: AC

## 2023-02-18 NOTE — Addendum Note (Signed)
Addended by: Hoy Register on: 02/18/2023 03:37 PM   Modules accepted: Orders

## 2023-02-18 NOTE — Telephone Encounter (Signed)
Call was routed to provider and Medication has been sent to patient pharmacy.

## 2023-02-18 NOTE — Telephone Encounter (Signed)
I have sent Prescription to her pharmacy.

## 2023-02-18 NOTE — Telephone Encounter (Signed)
Copied from CRM [id number]. Topic: General - Inquiry >> Feb 18, 2023  3:14 PM De Blanch wrote: Reason for CRM:Pt is calling to follow up on the medication request. Pt spoke with NT 07/19; see TE.  Please advise.  Stated needs this as soon as possible.

## 2023-02-19 ENCOUNTER — Other Ambulatory Visit: Payer: Self-pay

## 2023-03-05 ENCOUNTER — Telehealth: Payer: Self-pay

## 2023-03-05 NOTE — Telephone Encounter (Signed)
Copied from CRM [id number]. Topic: General - Other >> Mar 05, 2023  4:16 PM Dondra Prader E wrote: Reason for CRM: Pt called back to report that she has received the wrong prescription for mounjaro. She says she wants the 5 instead of the 2.5 and that she spoke about this with the pharmacist.

## 2023-03-05 NOTE — Telephone Encounter (Signed)
Left VM or patient to return call regarding hospital openings.

## 2023-03-08 NOTE — Telephone Encounter (Signed)
Pt was called and informed that the 5 mg shot will be available for pick up on Monday.

## 2023-04-04 ENCOUNTER — Other Ambulatory Visit: Payer: Self-pay | Admitting: Family Medicine

## 2023-04-04 DIAGNOSIS — E1169 Type 2 diabetes mellitus with other specified complication: Secondary | ICD-10-CM

## 2023-04-05 ENCOUNTER — Other Ambulatory Visit: Payer: Self-pay | Admitting: Family Medicine

## 2023-04-05 DIAGNOSIS — E1159 Type 2 diabetes mellitus with other circulatory complications: Secondary | ICD-10-CM

## 2023-04-05 DIAGNOSIS — I152 Hypertension secondary to endocrine disorders: Secondary | ICD-10-CM

## 2023-04-05 NOTE — Telephone Encounter (Signed)
Requested Prescriptions  Pending Prescriptions Disp Refills   valsartan (DIOVAN) 80 MG tablet [Pharmacy Med Name: VALSARTAN 80MG  TABLETS] 90 tablet 1    Sig: TAKE 1 TABLET(80 MG) BY MOUTH DAILY     Cardiovascular:  Angiotensin Receptor Blockers Passed - 04/05/2023  8:59 AM      Passed - Cr in normal range and within 180 days    Creat  Date Value Ref Range Status  09/06/2016 0.88 0.50 - 1.10 mg/dL Final   Creatinine, Ser  Date Value Ref Range Status  10/08/2022 0.92 0.57 - 1.00 mg/dL Final   Creatinine, Urine  Date Value Ref Range Status  11/18/2007 81.6  Final         Passed - K in normal range and within 180 days    Potassium  Date Value Ref Range Status  10/08/2022 5.1 3.5 - 5.2 mmol/L Final         Passed - Patient is not pregnant      Passed - Last BP in normal range    BP Readings from Last 1 Encounters:  02/11/23 133/85         Passed - Valid encounter within last 6 months    Recent Outpatient Visits           1 month ago Annual visit for general adult medical examination with abnormal findings   [place] [facility] [place], Odette Horns, MD   2 months ago Type 2 diabetes mellitus with peripheral neuropathy [facility])   [place] [facility] [place], [place] L, RPH-CPP   2 months ago Type 2 diabetes mellitus with peripheral neuropathy [facility])   [place] [facility] [place], [place], MD   3 months ago Type 2 diabetes mellitus with peripheral neuropathy [facility])   [place] [facility] [place], [place] L, RPH-CPP   4 months ago Type 2 diabetes mellitus with peripheral neuropathy [facility])    [facility] [place], Cornelius Moras, RPH-CPP       Future Appointments             In 1 month Hoy Register, MD [facility]   In 3 months O'Neal, Ronnald Ramp, MD [facility]

## 2023-04-11 ENCOUNTER — Encounter (HOSPITAL_COMMUNITY): Payer: Self-pay

## 2023-04-11 ENCOUNTER — Other Ambulatory Visit: Payer: Self-pay

## 2023-04-11 ENCOUNTER — Emergency Department (HOSPITAL_COMMUNITY): Payer: Medicaid Other

## 2023-04-11 ENCOUNTER — Emergency Department (HOSPITAL_COMMUNITY)
Admission: EM | Admit: 2023-04-11 | Discharge: 2023-04-11 | Disposition: A | Discharge status: Home / Self Care | Payer: Medicaid Other | Attending: Emergency Medicine | Admitting: Emergency Medicine

## 2023-04-11 DIAGNOSIS — R079 Chest pain, unspecified: Secondary | ICD-10-CM | POA: Diagnosis not present

## 2023-04-11 DIAGNOSIS — J069 Acute upper respiratory infection, unspecified: Secondary | ICD-10-CM | POA: Diagnosis not present

## 2023-04-11 DIAGNOSIS — U071 COVID-19: Secondary | ICD-10-CM | POA: Insufficient documentation

## 2023-04-11 DIAGNOSIS — R0602 Shortness of breath: Secondary | ICD-10-CM | POA: Diagnosis not present

## 2023-04-11 DIAGNOSIS — Z79899 Other long term (current) drug therapy: Secondary | ICD-10-CM | POA: Insufficient documentation

## 2023-04-11 DIAGNOSIS — R509 Fever, unspecified: Secondary | ICD-10-CM | POA: Diagnosis present

## 2023-04-11 DIAGNOSIS — Z794 Long term (current) use of insulin: Secondary | ICD-10-CM | POA: Diagnosis not present

## 2023-04-11 DIAGNOSIS — R519 Headache, unspecified: Secondary | ICD-10-CM | POA: Diagnosis not present

## 2023-04-11 DIAGNOSIS — R918 Other nonspecific abnormal finding of lung field: Secondary | ICD-10-CM | POA: Diagnosis not present

## 2023-04-11 LAB — COMPREHENSIVE METABOLIC PANEL
ALT: 39 U/L (ref 0–44)
AST: 43 U/L — ABNORMAL HIGH (ref 15–41)
Albumin: 3.6 g/dL (ref 3.5–5.0)
Alkaline Phosphatase: 111 U/L (ref 38–126)
Anion gap: 16 — ABNORMAL HIGH (ref 5–15)
BUN: 13 mg/dL (ref 6–20)
CO2: 22 mmol/L (ref 22–32)
Calcium: 9.7 mg/dL (ref 8.9–10.3)
Chloride: 99 mmol/L (ref 98–111)
Creatinine, Ser: 1.08 mg/dL — ABNORMAL HIGH (ref 0.44–1.00)
GFR, Estimated: >60 mL/min (ref >60)
Glucose, Bld: 123 mg/dL — ABNORMAL HIGH (ref 70–99)
Potassium: 4.2 mmol/L (ref 3.5–5.1)
Sodium: 137 mmol/L (ref 135–145)
Total Bilirubin: 0.4 mg/dL (ref 0.3–1.2)
Total Protein: 7.4 g/dL (ref 6.5–8.1)

## 2023-04-11 LAB — I-STAT CG4 LACTIC ACID, ED
Lactic Acid, Venous: 0.9 mmol/L (ref 0.5–1.9)
Lactic Acid, Venous: 0.8 mmol/L (ref 0.5–1.9)

## 2023-04-11 LAB — CBC WITH DIFFERENTIAL/PLATELET
Abs Immature Granulocytes: 0.02 10*3/uL (ref 0.00–0.07)
Basophils Absolute: 0 10*3/uL (ref 0.0–0.1)
Basophils Relative: 0 %
Eosinophils Absolute: 0.1 10*3/uL (ref 0.0–0.5)
Eosinophils Relative: 1 %
HCT: 42.8 % (ref 36.0–46.0)
Hemoglobin: 13.6 g/dL (ref 12.0–15.0)
Immature Granulocytes: 0 %
Lymphocytes Relative: 8 %
Lymphs Abs: 0.6 10*3/uL — ABNORMAL LOW (ref 0.7–4.0)
MCH: 26.4 pg (ref 26.0–34.0)
MCHC: 31.8 g/dL (ref 30.0–36.0)
MCV: 82.9 fL (ref 80.0–100.0)
Monocytes Absolute: 0.8 10*3/uL (ref 0.1–1.0)
Monocytes Relative: 11 %
Neutro Abs: 6.1 10*3/uL (ref 1.7–7.7)
Neutrophils Relative %: 80 %
Platelets: 206 10*3/uL (ref 150–400)
RBC: 5.16 MIL/uL — ABNORMAL HIGH (ref 3.87–5.11)
RDW: 13.7 % (ref 11.5–15.5)
WBC: 7.6 10*3/uL (ref 4.0–10.5)
nRBC: 0 % (ref 0.0–0.2)

## 2023-04-11 LAB — RESP PANEL BY RT-PCR (RSV, FLU A&B, COVID)  RVPGX2
Influenza A by PCR: NEGATIVE
Influenza B by PCR: NEGATIVE
Resp Syncytial Virus by PCR: NEGATIVE
SARS Coronavirus 2 by RT PCR: POSITIVE — AB

## 2023-04-11 LAB — URINALYSIS, W/ REFLEX TO CULTURE (INFECTION SUSPECTED)
Bacteria, UA: NONE SEEN
Bilirubin Urine: NEGATIVE
Glucose, UA: NEGATIVE mg/dL
Hgb urine dipstick: NEGATIVE
Ketones, ur: 5 mg/dL — AB
Leukocytes,Ua: NEGATIVE
Nitrite: NEGATIVE
Protein, ur: NEGATIVE mg/dL
Specific Gravity, Urine: 1.014 (ref 1.005–1.030)
pH: 5 (ref 5.0–8.0)

## 2023-04-11 LAB — TROPONIN I (HIGH SENSITIVITY)
Troponin I (High Sensitivity): 7 ng/L (ref <18)
Troponin I (High Sensitivity): 7 ng/L (ref <18)

## 2023-04-11 LAB — HCG, SERUM, QUALITATIVE: Preg, Serum: NEGATIVE

## 2023-04-11 MED ORDER — PAXLOVID (300/100) 20 X 150 MG & 10 X 100MG PO TBPK: 3.0000 | ORAL_TABLET | Freq: Two times a day (BID) | ORAL | 0 refills | Status: AC

## 2023-04-11 MED ORDER — LACTATED RINGERS IV BOLUS
1000.0000 mL | Freq: Once | INTRAVENOUS | Status: AC
  Administered 2023-04-11: 1000 mL via INTRAVENOUS

## 2023-04-11 MED ORDER — ACETAMINOPHEN 325 MG PO TABS: 650.0000 mg | ORAL_TABLET | Freq: Once | ORAL | Status: DC

## 2023-04-11 MED ORDER — LACTATED RINGERS IV SOLN: INTRAVENOUS | Status: DC

## 2023-04-11 MED ORDER — KETOROLAC TROMETHAMINE 30 MG/ML IJ SOLN
15.0000 mg | Freq: Once | INTRAMUSCULAR | Status: AC
  Administered 2023-04-11: 15 mg via INTRAVENOUS
  Filled 2023-04-11: qty 1

## 2023-04-11 MED ORDER — MORPHINE SULFATE (PF) 4 MG/ML IV SOLN
4.0000 mg | Freq: Once | INTRAVENOUS | Status: AC
  Administered 2023-04-11: 4 mg via INTRAVENOUS
  Filled 2023-04-11: qty 1

## 2023-04-11 NOTE — ED Notes (Signed)
Phlebotomist at bedside at this time  

## 2023-04-11 NOTE — ED Provider Notes (Signed)
[place] [facility] DE[facility]MENT AT [facility] Provider Note   CSN: [id number] Arrival date & time: 04/11/23  1801     History  No chief complaint on file.   Nicole [name] is a 47 y.o. female.  47 year old female presents with sudden onset of weakness, URI symptoms.  She has noted myalgias as well as fever.  Mild cough appreciated.  Some shortness of breath.  Diffuse whole body aching including her chest.  Denies any severe dysuria.  Has used over-the-counter medications without relief       Home Medications Prior to Admission medications   Medication Sig Start Date End Date Taking? Authorizing Provider  Accu-Chek Softclix Lancets lancets Use as instructed tid before meals 04/03/22   Hoy Register, MD  albuterol (VENTOLIN HFA) 108 (90 Base) MCG/ACT inhaler Inhale 2 puffs into the lungs every 6 (six) hours as needed for wheezing or shortness of breath. 04/20/20   Hoy Register, MD  atorvastatin (LIPITOR) 20 MG tablet TAKE 1 TABLET(20 MG) BY MOUTH DAILY 04/04/23   Hoy Register, MD  blood glucose meter kit and supplies KIT Dispense based on patient and insurance preference. Use up to four times daily as directed. 02/15/21   Azucena Fallen, MD  budesonide-formoterol [facility]) 160-4.5 MCG/ACT inhaler Inhale 2 puffs into the lungs daily. 08/28/21   LampteyBritta Mccreedy, MD  conjugated estrogens (PREMARIN) vaginal cream Place 0.5 grams vaginally 2 (two) times a week. For 4 weeks 02/11/23   Hoy Register, MD  fluconazole (DIFLUCAN) 150 MG tablet Take 150 mg by mouth once. 10/25/22   [provider]  gabapentin (NEURONTIN) 300 MG capsule Take 1 capsule (300 mg total) by mouth at bedtime. 10/08/22   Hoy Register, MD  glucose blood (ACCU-CHEK GUIDE) test strip Use as instructed 11/23/21   Marcine Matar, MD  glucose blood (FREESTYLE LITE) test strip Use as instructed 09/06/21   Edwin Dada P, DO  hydrochlorothiazide (HYDRODIURIL) 25 MG tablet TAKE 1 TABLET(25 MG)  BY MOUTH DAILY 01/29/23   Hoy Register, MD  insulin glargine (LANTUS) 100 UNIT/ML Solostar Pen Inject 30 Units into the skin daily. 10/08/22   Hoy Register, MD  Insulin Pen Needle (PEN NEEDLES) 32G X 4 MM MISC Use to inject insulin once daily. 01/09/23   Hoy Register, MD  lidocaine (LIDODERM) 5 % Place 1 patch onto the skin daily. Remove & Discard patch within 12 hours or as directed by MD 04/03/22   Hoy Register, MD  meloxicam (MOBIC) 7.5 MG tablet Take 1 tablet (7.5 mg total) by mouth daily. 07/16/22   Hoy Register, MD  tiotropium (SPIRIVA HANDIHALER) 18 MCG inhalation capsule Place 1 capsule (18 mcg total) into inhaler and inhale daily. 06/15/19   Hoy Register, MD  tirzepatide [facility]) 2.5 MG/0.5ML Pen Inject 2.5 mg into the skin once a week. 02/01/23   Hoy Register, MD  tirzepatide [facility]) 5 MG/0.5ML Pen Inject 5 mg into the skin once a week. 01/22/23   Hoy Register, MD  topiramate (TOPAMAX) 25 MG tablet Take 1 tablet (25 mg total) by mouth 2 (two) times daily. 04/03/22   Hoy Register, MD  triamcinolone cream (KENALOG) 0.1 % Apply 1 Application topically 2 (two) times daily. 01/08/23   Hoy Register, MD  valsartan (DIOVAN) 80 MG tablet TAKE 1 TABLET(80 MG) BY MOUTH DAILY 04/05/23   Hoy Register, MD  metoprolol tartrate (LOPRESSOR) 100 MG tablet Take 1 tablet by mouth once for procedure. 04/05/21   O'Neal, Ronnald Ramp, MD  Allergies    Patient has no known allergies.    Review of Systems   Review of Systems  All other systems reviewed and are negative.   Physical Exam Updated Vital Signs BP (!) 172/89   Pulse 73   Temp 98.7 F (37.1 C) (Oral)   Resp 19   SpO2 96%  Physical Exam Vitals and nursing note reviewed.  Constitutional:      General: She is not in acute distress.    Appearance: Normal appearance. She is well-developed. She is not toxic-appearing.  HENT:     Head: Normocephalic and atraumatic.  Eyes:     General: Lids are normal.      Conjunctiva/sclera: Conjunctivae normal.     Pupils: Pupils are equal, round, and reactive to light.  Neck:     Thyroid: No thyroid mass.     Trachea: No tracheal deviation.  Cardiovascular:     Rate and Rhythm: Normal rate and regular rhythm.     Heart sounds: Normal heart sounds. No murmur heard.    No gallop.  Pulmonary:     Effort: Pulmonary effort is normal. No respiratory distress.     Breath sounds: Normal breath sounds. No stridor. No decreased breath sounds, wheezing, rhonchi or rales.  Abdominal:     General: There is no distension.     Palpations: Abdomen is soft.     Tenderness: There is no abdominal tenderness. There is no rebound.  Musculoskeletal:        General: No tenderness. Normal range of motion.     Cervical back: Normal range of motion and neck supple.  Skin:    General: Skin is warm and dry.     Findings: No abrasion or rash.  Neurological:     Mental Status: She is alert and oriented to person, place, and time. Mental status is at baseline.     GCS: GCS eye subscore is 4. GCS verbal subscore is 5. GCS motor subscore is 6.     Cranial Nerves: Cranial nerves are intact. No cranial nerve deficit.     Sensory: No sensory deficit.     Motor: Motor function is intact.  Psychiatric:        Attention and Perception: Attention normal.        Speech: Speech normal.        Behavior: Behavior normal.     ED Results / Procedures / Treatments   Labs (all labs ordered are listed, but only abnormal results are displayed) Labs Reviewed  RESP PANEL BY RT-PCR (RSV, FLU A&B, COVID)  RVPGX2 - Abnormal; Notable for the following components:      Result Value   SARS Coronavirus 2 by RT PCR POSITIVE (*)    All other components within normal limits  COMPREHENSIVE METABOLIC PANEL - Abnormal; Notable for the following components:   Glucose, Bld 123 (*)    Creatinine, Ser 1.08 (*)    AST 43 (*)    Anion gap 16 (*)    All other components within normal limits  CBC WITH  DIFFERENTIAL/PLATELET - Abnormal; Notable for the following components:   RBC 5.16 (*)    Lymphs Abs 0.6 (*)    All other components within normal limits  URINALYSIS, W/ REFLEX TO CULTURE (INFECTION SUSPECTED) - Abnormal; Notable for the following components:   Ketones, ur 5 (*)    All other components within normal limits  HCG, SERUM, QUALITATIVE  I-STAT CG4 LACTIC ACID, ED  TROPONIN I (HIGH SENSITIVITY)  EKG EKG Interpretation Date/Time:  Thursday April 11 2023 18:19:35 EDT Ventricular Rate:  73 PR Interval:  172 QRS Duration:  154 QT Interval:  420 QTC Calculation: 462 R Axis:   7  Text Interpretation: Normal sinus rhythm Right bundle branch block Abnormal ECG When compared with ECG of 08-Jan-2023 12:33, PREVIOUS ECG IS PRESENT No significant change since last tracing Confirmed by Lorre Nick ([id number]) on 04/11/2023 7:56:06 PM  Radiology No results found.  Procedures Procedures    Medications Ordered in ED Medications  acetaminophen (TYLENOL) tablet 650 mg (has no administration in time range)  lactated ringers infusion (has no administration in time range)  lactated ringers bolus 1,000 mL (has no administration in time range)  ketorolac (TORADOL) 30 MG/ML injection 15 mg (has no administration in time range)  morphine (PF) 4 MG/ML injection 4 mg (has no administration in time range)    ED Course/ Medical Decision Making/ A&P                                 Medical Decision Making Amount and/or Complexity of Data Reviewed Labs: ordered. Radiology: ordered.  Risk Prescription drug management.   Patient's chest x-ray per my interpretation shows no significant infiltrate.  Patient treat with IV fluids along with anti-inflammatories and also analgesics.  Patient reassessed after her fluids and feels much better.  She is COVID-positive here.  Patient is not hypoxic at this time.  Appears well-hydrated.  Does not appear to require admission.  Will place on  Paxlovid if there are no contraindications.        Final Clinical Impression(s) / ED Diagnoses Final diagnoses:  None    Rx / DC Orders ED Discharge Orders     None         Lorre Nick, MD 04/11/23 2212

## 2023-04-11 NOTE — ED Notes (Signed)
Pt to xray

## 2023-04-11 NOTE — ED Triage Notes (Signed)
Pt c/o dizziness x 1 day; endorses lower back pain, throat, slight cough, some sob; endorses polyuria, denies dysuria

## 2023-04-22 ENCOUNTER — Encounter (HOSPITAL_COMMUNITY): Payer: Self-pay | Admitting: Emergency Medicine

## 2023-04-22 ENCOUNTER — Emergency Department (HOSPITAL_COMMUNITY)
Admission: EM | Admit: 2023-04-22 | Discharge: 2023-04-22 | Disposition: A | Discharge status: Home / Self Care | Payer: Medicaid Other | Attending: Emergency Medicine | Admitting: Emergency Medicine

## 2023-04-22 ENCOUNTER — Other Ambulatory Visit: Payer: Self-pay

## 2023-04-22 ENCOUNTER — Emergency Department (HOSPITAL_COMMUNITY): Payer: Medicaid Other

## 2023-04-22 DIAGNOSIS — R918 Other nonspecific abnormal finding of lung field: Secondary | ICD-10-CM | POA: Diagnosis not present

## 2023-04-22 DIAGNOSIS — R001 Bradycardia, unspecified: Secondary | ICD-10-CM | POA: Insufficient documentation

## 2023-04-22 DIAGNOSIS — I517 Cardiomegaly: Secondary | ICD-10-CM | POA: Diagnosis not present

## 2023-04-22 DIAGNOSIS — Z8616 Personal history of COVID-19: Secondary | ICD-10-CM | POA: Diagnosis not present

## 2023-04-22 DIAGNOSIS — I10 Essential (primary) hypertension: Secondary | ICD-10-CM | POA: Diagnosis not present

## 2023-04-22 DIAGNOSIS — R55 Syncope and collapse: Secondary | ICD-10-CM | POA: Diagnosis not present

## 2023-04-22 DIAGNOSIS — R079 Chest pain, unspecified: Secondary | ICD-10-CM | POA: Diagnosis not present

## 2023-04-22 DIAGNOSIS — R42 Dizziness and giddiness: Secondary | ICD-10-CM | POA: Diagnosis not present

## 2023-04-22 LAB — COMPREHENSIVE METABOLIC PANEL
ALT: 41 U/L (ref 0–44)
AST: 34 U/L (ref 15–41)
Albumin: 3.1 g/dL — ABNORMAL LOW (ref 3.5–5.0)
Alkaline Phosphatase: 101 U/L (ref 38–126)
Anion gap: 8 (ref 5–15)
BUN: 11 mg/dL (ref 6–20)
CO2: 25 mmol/L (ref 22–32)
Calcium: 8.7 mg/dL — ABNORMAL LOW (ref 8.9–10.3)
Chloride: 103 mmol/L (ref 98–111)
Creatinine, Ser: 1.02 mg/dL — ABNORMAL HIGH (ref 0.44–1.00)
GFR, Estimated: >60 mL/min (ref >60)
Glucose, Bld: 225 mg/dL — ABNORMAL HIGH (ref 70–99)
Potassium: 3.5 mmol/L (ref 3.5–5.1)
Sodium: 136 mmol/L (ref 135–145)
Total Bilirubin: 0.6 mg/dL (ref 0.3–1.2)
Total Protein: 6.8 g/dL (ref 6.5–8.1)

## 2023-04-22 LAB — CBC WITH DIFFERENTIAL/PLATELET
Abs Immature Granulocytes: 0.03 10*3/uL (ref 0.00–0.07)
Basophils Absolute: 0 10*3/uL (ref 0.0–0.1)
Basophils Relative: 0 %
Eosinophils Absolute: 0.1 10*3/uL (ref 0.0–0.5)
Eosinophils Relative: 1 %
HCT: 42 % (ref 36.0–46.0)
Hemoglobin: 13.2 g/dL (ref 12.0–15.0)
Immature Granulocytes: 0 %
Lymphocytes Relative: 24 %
Lymphs Abs: 1.9 10*3/uL (ref 0.7–4.0)
MCH: 26.2 pg (ref 26.0–34.0)
MCHC: 31.4 g/dL (ref 30.0–36.0)
MCV: 83.3 fL (ref 80.0–100.0)
Monocytes Absolute: 0.4 10*3/uL (ref 0.1–1.0)
Monocytes Relative: 6 %
Neutro Abs: 5.5 10*3/uL (ref 1.7–7.7)
Neutrophils Relative %: 69 %
Platelets: 256 10*3/uL (ref 150–400)
RBC: 5.04 MIL/uL (ref 3.87–5.11)
RDW: 13.9 % (ref 11.5–15.5)
WBC: 8 10*3/uL (ref 4.0–10.5)
nRBC: 0 % (ref 0.0–0.2)

## 2023-04-22 LAB — TROPONIN I (HIGH SENSITIVITY)
Troponin I (High Sensitivity): 4 ng/L (ref <18)
Troponin I (High Sensitivity): 7 ng/L (ref <18)

## 2023-04-22 LAB — BRAIN NATRIURETIC PEPTIDE: B Natriuretic Peptide: 57.3 pg/mL (ref 0.0–100.0)

## 2023-04-22 LAB — HCG, SERUM, QUALITATIVE: Preg, Serum: NEGATIVE

## 2023-04-22 LAB — CBG MONITORING, ED: Glucose-Capillary: 156 mg/dL — ABNORMAL HIGH (ref 70–99)

## 2023-04-22 LAB — D-DIMER, QUANTITATIVE: D-Dimer, Quant: 0.39 ug/mL-FEU (ref 0.00–0.50)

## 2023-04-22 MED ORDER — IRBESARTAN 75 MG PO TABS
75.0000 mg | ORAL_TABLET | Freq: Once | ORAL | Status: AC
  Administered 2023-04-22: 75 mg via ORAL
  Filled 2023-04-22: qty 1

## 2023-04-22 MED ORDER — SODIUM CHLORIDE 0.9 % IV SOLN: INTRAVENOUS | Status: DC

## 2023-04-22 MED ORDER — HYDROCHLOROTHIAZIDE 25 MG PO TABS
25.0000 mg | ORAL_TABLET | Freq: Once | ORAL | Status: AC
  Administered 2023-04-22: 25 mg via ORAL
  Filled 2023-04-22: qty 1

## 2023-04-22 NOTE — ED Provider Notes (Signed)
EMERGENCY DEPARTMENT AT [facility] Provider Note   CSN: [id number] Arrival date & time: 04/22/23  1117     History  No chief complaint on file.   Nicole [name] is a 47 y.o. female.  HPI Patient presents from court after an episode of near syncope.  Patient was in her usual state of health, recovering from recent COVID infection when she felt lightheaded, with chest pain, lowered her self to the ground from sitting.  Was a dizziness component.  That seems to have resolved.  Chest pain is minimal currently.     Home Medications Prior to Admission medications   Medication Sig Start Date End Date Taking? Authorizing Provider  Accu-Chek Softclix Lancets lancets Use as instructed tid before meals 04/03/22   Hoy Register, MD  albuterol (VENTOLIN HFA) 108 (90 Base) MCG/ACT inhaler Inhale 2 puffs into the lungs every 6 (six) hours as needed for wheezing or shortness of breath. 04/20/20   Hoy Register, MD  atorvastatin (LIPITOR) 20 MG tablet TAKE 1 TABLET(20 MG) BY MOUTH DAILY 04/04/23   Hoy Register, MD  blood glucose meter kit and supplies KIT Dispense based on patient and insurance preference. Use up to four times daily as directed. 02/15/21   Azucena Fallen, MD  budesonide-formoterol [facility]) 160-4.5 MCG/ACT inhaler Inhale 2 puffs into the lungs daily. 08/28/21   LampteyBritta Mccreedy, MD  conjugated estrogens (PREMARIN) vaginal cream Place 0.5 grams vaginally 2 (two) times a week. For 4 weeks 02/11/23   Hoy Register, MD  fluconazole (DIFLUCAN) 150 MG tablet Take 150 mg by mouth once. 10/25/22   [provider]  gabapentin (NEURONTIN) 300 MG capsule Take 1 capsule (300 mg total) by mouth at bedtime. 10/08/22   Hoy Register, MD  glucose blood (ACCU-CHEK GUIDE) test strip Use as instructed 11/23/21   Marcine Matar, MD  glucose blood (FREESTYLE LITE) test strip Use as instructed 09/06/21   Edwin Dada P, DO  hydrochlorothiazide (HYDRODIURIL) 25 MG  tablet TAKE 1 TABLET(25 MG) BY MOUTH DAILY 01/29/23   Hoy Register, MD  insulin glargine (LANTUS) 100 UNIT/ML Solostar Pen Inject 30 Units into the skin daily. 10/08/22   Hoy Register, MD  Insulin Pen Needle (PEN NEEDLES) 32G X 4 MM MISC Use to inject insulin once daily. 01/09/23   Hoy Register, MD  lidocaine (LIDODERM) 5 % Place 1 patch onto the skin daily. Remove & Discard patch within 12 hours or as directed by MD 04/03/22   Hoy Register, MD  meloxicam (MOBIC) 7.5 MG tablet Take 1 tablet (7.5 mg total) by mouth daily. 07/16/22   Hoy Register, MD  tiotropium (SPIRIVA HANDIHALER) 18 MCG inhalation capsule Place 1 capsule (18 mcg total) into inhaler and inhale daily. 06/15/19   Hoy Register, MD  tirzepatide [facility]) 2.5 MG/0.5ML Pen Inject 2.5 mg into the skin once a week. 02/01/23   Hoy Register, MD  tirzepatide [facility]) 5 MG/0.5ML Pen Inject 5 mg into the skin once a week. 01/22/23   Hoy Register, MD  topiramate (TOPAMAX) 25 MG tablet Take 1 tablet (25 mg total) by mouth 2 (two) times daily. 04/03/22   Hoy Register, MD  triamcinolone cream (KENALOG) 0.1 % Apply 1 Application topically 2 (two) times daily. 01/08/23   Hoy Register, MD  valsartan (DIOVAN) 80 MG tablet TAKE 1 TABLET(80 MG) BY MOUTH DAILY 04/05/23   Hoy Register, MD  metoprolol tartrate (LOPRESSOR) 100 MG tablet Take 1 tablet by mouth once for procedure. 04/05/21  Sande Rives, MD      Allergies    Patient has no known allergies.    Review of Systems   Review of Systems  All other systems reviewed and are negative.   Physical Exam Updated Vital Signs BP (!) 165/90   Pulse 68   Temp 98 F (36.7 C) (Oral)   Resp 16   Ht 5\' 8"  (1.727 m)   Wt (!) 155.1 kg Comment: not sure what current weight.  Entered weight from 2 months ago  SpO2 99%   BMI 51.99 kg/m  Physical Exam Vitals and nursing note reviewed.  Constitutional:      General: She is not in acute distress.    Appearance: She is  well-developed. She is obese.  HENT:     Head: Normocephalic and atraumatic.  Eyes:     Conjunctiva/sclera: Conjunctivae normal.  Cardiovascular:     Rate and Rhythm: Normal rate and regular rhythm.  Pulmonary:     Effort: Pulmonary effort is normal. No respiratory distress.     Breath sounds: Normal breath sounds. No stridor.  Abdominal:     General: There is no distension.  Skin:    General: Skin is warm and dry.  Neurological:     Mental Status: She is alert and oriented to person, place, and time.     Cranial Nerves: No cranial nerve deficit.  Psychiatric:        Mood and Affect: Mood normal.     ED Results / Procedures / Treatments   Labs (all labs ordered are listed, but only abnormal results are displayed) Labs Reviewed  COMPREHENSIVE METABOLIC PANEL - Abnormal; Notable for the following components:      Result Value   Glucose, Bld 225 (*)    Creatinine, Ser 1.02 (*)    Calcium 8.7 (*)    Albumin 3.1 (*)    All other components within normal limits  CBG MONITORING, ED - Abnormal; Notable for the following components:   Glucose-Capillary 156 (*)    All other components within normal limits  CBC WITH DIFFERENTIAL/PLATELET  HCG, SERUM, QUALITATIVE  D-DIMER, QUANTITATIVE  BRAIN NATRIURETIC PEPTIDE  TROPONIN I (HIGH SENSITIVITY)  TROPONIN I (HIGH SENSITIVITY)    EKG EKG Interpretation Date/Time:  Monday April 22 2023 11:26:13 EDT Ventricular Rate:  45 PR Interval:  174 QRS Duration:  161 QT Interval:  484 QTC Calculation: 419 R Axis:   3  Text Interpretation: Sinus bradycardia Right bundle branch block Left ventricular hypertrophy Baseline wander in lead(s) II III aVR aVL aVF V2 V3 V4 V5 V6 Confirmed by Gerhard Munch [phone]) on 04/22/2023 12:13:35 PM  Radiology DG Chest Port 1 View  Result Date: 04/22/2023 CLINICAL DATA:  Dizziness. EXAM: PORTABLE CHEST 1 VIEW COMPARISON:  04/11/2023; 04/21/2022 FINDINGS: Examination is degraded due to patient body  habitus. Unchanged enlarged cardiac silhouette and mediastinal contours given patient rotation. Pulmonary vasculature appears less distinct than present examination with cephalization of flow. No definite pleural effusion pneumothorax. No acute osseous abnormalities. IMPRESSION: Cardiomegaly with findings suggestive of pulmonary edema. Electronically Signed   By: Simonne Come M.D.   On: 04/22/2023 13:09    Procedures Procedures    Medications Ordered in ED Medications  0.9 %  sodium chloride infusion (has no administration in time range)  hydrochlorothiazide (HYDRODIURIL) tablet 25 mg (25 mg Oral Given 04/22/23 1453)  irbesartan (AVAPRO) tablet 75 mg (75 mg Oral Given 04/22/23 1453)    ED Course/ Medical Decision Making/ A&P  Medical Decision Making Obese adult female presents after episode of dizziness, lightheadedness, chest pain.  With recent COVID, broad differential including post-COVID complications, PE, ACS, arrhythmia, vertigo all considered. Cardiac 45 sinus bradycardia abnormal Pulse ox 100% room air normal   Amount and/or Complexity of Data Reviewed Independent Historian: EMS    Details: Discussed on arrival External Data Reviewed: notes. Labs: ordered. Decision-making details documented in ED Course. Radiology: ordered and independent interpretation performed. ECG/medicine tests: ordered and independent interpretation performed. Decision-making details documented in ED Course.  Risk Prescription drug management.   3:55 PM Patient awake and alert, labs unremarkable, x-ray unremarkable, ECG mild bradycardia.  Heart rate now in the 60s.  We discussed possibilities for her episode of near syncope, with ongoing mild bradycardia.  Medications are a primary consideration and the patient is comfortable with close outpatient follow-up though we discussed hospitalization as well. No current evidence for arrhythmia, ACS, electrolyte abnormalities.   Ambulatory to referral to cardiology facilitated.        Final Clinical Impression(s) / ED Diagnoses Final diagnoses:  Near syncope  Bradycardia    Rx / DC Orders ED Discharge Orders          Ordered    Ambulatory referral to Cardiology       Comments: If you have not heard from the Cardiology office within the next 72 hours please call [phone].   04/22/23 1554              Gerhard Munch, MD 04/22/23 1555

## 2023-04-22 NOTE — Discharge Instructions (Addendum)
Our cardiology colleagues will reach out to you to schedule an appointment in the coming days.  Monitor your condition carefully and do not hesitate to return here if you develop new, concerning changes in the interim.

## 2023-04-22 NOTE — ED Notes (Signed)
Pt verbalized understanding of discharge instructions and follow up, pt given opportunities to ask questions no concerns at this time, VSS, ambulatory to ED entrance

## 2023-04-22 NOTE — ED Triage Notes (Signed)
Pt BIB [facility] for sudden onset of dizziness this AM [place] cour[place].  Pt was in the hallway outside courtroom when dizziness began.  She became hot and "the world started spinning".  Pt did endorse a little CP earlier this AM as well as a bad HA last night. She states she was feeling anxious at the courthouse and might have been breathing fast.  Endorses some tingling in fingers at that time.  PT is a diabetic, uses an insulin pen but states she hasn't used in about 4 days.   BP 170/90 98% RA, sinus brady at 47.

## 2023-04-25 ENCOUNTER — Telehealth: Payer: Self-pay

## 2023-04-25 NOTE — Telephone Encounter (Signed)
Copied from CRM [id number]. Topic: General - Other >> Apr 25, 2023  9:22 AM Franchot Heidelberg wrote: Reason for CRM: Pt wants to be rescheduled, and also says that she needs a letter. Wants to speak to a nurse Best contact:  [phone]

## 2023-04-26 NOTE — Telephone Encounter (Signed)
Pt has been called and rescheduled.

## 2023-05-04 ENCOUNTER — Other Ambulatory Visit: Payer: Self-pay | Admitting: Family Medicine

## 2023-05-04 DIAGNOSIS — E1159 Type 2 diabetes mellitus with other circulatory complications: Secondary | ICD-10-CM

## 2023-05-04 DIAGNOSIS — R600 Localized edema: Secondary | ICD-10-CM

## 2023-05-06 NOTE — Telephone Encounter (Signed)
Aoointment 05/21/23 Requested Prescriptions  Pending Prescriptions Disp Refills   hydrochlorothiazide (HYDRODIURIL) 25 MG tablet [Pharmacy Med Name: HYDROCHLOROTHIAZIDE 25MG TABLETS] 90 tablet 0    Sig: TAKE 1 TABLET(25 MG) BY MOUTH DAILY     Cardiovascular: Diuretics - Thiazide Failed - 05/04/2023  3:08 AM      Failed - Cr in normal range and within 180 days    Creat  Date Value Ref Range Status  09/06/2016 0.88 0.50 - 1.10 mg/dL Final   Creatinine, Ser  Date Value Ref Range Status  04/22/2023 1.02 (H) 0.44 - 1.00 mg/dL Final   Creatinine, Urine  Date Value Ref Range Status  11/18/2007 81.6  Final         Failed - Last BP in normal range    BP Readings from Last 1 Encounters:  04/22/23 (!) 147/89         Passed - K in normal range and within 180 days    Potassium  Date Value Ref Range Status  04/22/2023 3.5 3.5 - 5.1 mmol/L Final         Passed - Na in normal range and within 180 days    Sodium  Date Value Ref Range Status  04/22/2023 136 135 - 145 mmol/L Final  10/08/2022 141 134 - 144 mmol/L Final         Passed - Valid encounter within last 6 months    Recent Outpatient Visits           2 months ago Annual visit for general adult medical examination with abnormal findings   [place] [facility] [place], Odette Horns, MD   3 months ago Type 2 diabetes mellitus with peripheral neuropathy [facility])   [place] [facility] [place], Jeannett Senior L, RPH-CPP   3 months ago Type 2 diabetes mellitus with peripheral neuropathy [facility])   [place] [facility] [place], [place], MD   4 months ago Type 2 diabetes mellitus with peripheral neuropathy [facility])   [place] [place], [place] L, RPH-CPP   5 months ago Type 2 diabetes mellitus with peripheral neuropathy [facility])   [place] [facility] [place], Cornelius Moras, RPH-CPP       Future Appointments              In 2 weeks Hoy Register, MD [facility]   In 2 months O'Neal, Ronnald Ramp, MD [facility]

## 2023-05-15 NOTE — Progress Notes (Deleted)
  Cardiology Office Note:  .   Date:  05/15/2023  ID:  Marcheta Grammes, DOB 06/01/1976, MRN [id number] PCP: Hoy Register, MD  [facility] Providers Cardiologist:  None { Click to update primary MD,subspecialty MD or APP then REFRESH:1}   History of Present Illness: .   Nicole [name] is a 47 y.o. female with history of DM, HLD, obesity who presents for follow-up. Seen in ER 04/22/2023 for pre-syncope.   BNP 57. Troponin 4->7. Cr 1.02. Glucose 225.      Problem List DM -A1c 9.1 HTN HLD -T chol 199, HDL 53, LDL 118, TG 161 Morbid obesity -BMI 51 5. Tobacco abuse  6. RBBB    ROS: All other ROS reviewed and negative. Pertinent positives noted in the HPI.     Studies Reviewed: Marland Kitchen        TTE 02/12/2023  1. Left ventricular ejection fraction, by estimation, is 55 to 60%. Left  ventricular ejection fraction by 3D volume is 56 %. The left ventricle has  normal function. The left ventricle has no regional wall motion  abnormalities. There is severe concentric   left ventricular hypertrophy. Left ventricular diastolic parameters are  consistent with Grade I diastolic dysfunction (impaired relaxation). The  average left ventricular global longitudinal strain is -22.0 %.   2. Right ventricular systolic function is normal. The right ventricular  size is normal. Tricuspid regurgitation signal is inadequate for assessing  PA pressure.   3. The mitral valve is normal in structure. Trivial mitral valve  regurgitation. No evidence of mitral stenosis.   4. The aortic valve is normal in structure. Aortic valve regurgitation is  not visualized. No aortic stenosis is present.   5. The inferior vena cava is normal in size with greater than 50%  respiratory variability, suggesting right atrial pressure of 3 mmHg.  Physical Exam:   VS:  There were no vitals taken for this visit.   Wt Readings from Last 3 Encounters:  04/22/23 (!) 341 lb 14.9 oz (155.1 kg)  02/11/23 (!) 342 lb (155.1  kg)  01/17/23 (!) 345 lb 12.8 oz (156.9 kg)    GEN: Well nourished, well developed in no acute distress NECK: No JVD; No carotid bruits CARDIAC: ***RRR, no murmurs, rubs, gallops RESPIRATORY:  Clear to auscultation without rales, wheezing or rhonchi  ABDOMEN: Soft, non-tender, non-distended EXTREMITIES:  No edema; No deformity  ASSESSMENT AND PLAN: .   ***    {Are you ordering a CV Procedure (e.g. stress test, cath, DCCV, TEE, etc)?   Press F2        :[id number]}   Follow-up: No follow-ups on file.  Time Spent with Patient: I have spent a total of *** minutes with patient reviewing hospital notes, telemetry, EKGs, labs and examining the patient as well as establishing an assessment and plan that was discussed with the patient.  > 50% of time was spent in direct patient care.  Signed, Lenna Gilford. Flora Lipps, MD, [facility]  [facility]  [address] [place], [place] [address] [phone]  10:53 PM

## 2023-05-16 ENCOUNTER — Ambulatory Visit: Payer: Medicaid Other | Attending: Cardiovascular Disease | Admitting: Cardiovascular Disease

## 2023-05-16 DIAGNOSIS — R55 Syncope and collapse: Secondary | ICD-10-CM

## 2023-05-16 DIAGNOSIS — E782 Mixed hyperlipidemia: Secondary | ICD-10-CM

## 2023-05-20 ENCOUNTER — Ambulatory Visit: Payer: Medicaid Other | Admitting: Family Medicine

## 2023-05-21 ENCOUNTER — Encounter: Payer: Self-pay | Admitting: Family Medicine

## 2023-05-21 ENCOUNTER — Other Ambulatory Visit (HOSPITAL_COMMUNITY)
Admission: RE | Admit: 2023-05-21 | Discharge: 2023-05-21 | Disposition: A | Discharge status: Home / Self Care | Payer: Medicaid Other | Source: Ambulatory Visit | Attending: Family Medicine | Admitting: Family Medicine

## 2023-05-21 ENCOUNTER — Ambulatory Visit: Payer: Medicaid Other | Attending: Family Medicine | Admitting: Family Medicine

## 2023-05-21 VITALS — BP 144/86 | HR 77 | Ht 68.0 in | Wt 335.6 lb

## 2023-05-21 DIAGNOSIS — R600 Localized edema: Secondary | ICD-10-CM | POA: Diagnosis not present

## 2023-05-21 DIAGNOSIS — Z8619 Personal history of other infectious and parasitic diseases: Secondary | ICD-10-CM | POA: Insufficient documentation

## 2023-05-21 DIAGNOSIS — B80 Enterobiasis: Secondary | ICD-10-CM | POA: Diagnosis not present

## 2023-05-21 DIAGNOSIS — Z7985 Long-term (current) use of injectable non-insulin antidiabetic drugs: Secondary | ICD-10-CM

## 2023-05-21 DIAGNOSIS — E1159 Type 2 diabetes mellitus with other circulatory complications: Secondary | ICD-10-CM | POA: Diagnosis not present

## 2023-05-21 DIAGNOSIS — Z6841 Body Mass Index (BMI) 40.0 and over, adult: Secondary | ICD-10-CM

## 2023-05-21 DIAGNOSIS — Z794 Long term (current) use of insulin: Secondary | ICD-10-CM

## 2023-05-21 DIAGNOSIS — N951 Menopausal and female climacteric states: Secondary | ICD-10-CM

## 2023-05-21 DIAGNOSIS — E66813 Obesity, class 3: Secondary | ICD-10-CM

## 2023-05-21 DIAGNOSIS — E1142 Type 2 diabetes mellitus with diabetic polyneuropathy: Secondary | ICD-10-CM | POA: Diagnosis not present

## 2023-05-21 DIAGNOSIS — Z634 Disappearance and death of family member: Secondary | ICD-10-CM | POA: Diagnosis not present

## 2023-05-21 DIAGNOSIS — I152 Hypertension secondary to endocrine disorders: Secondary | ICD-10-CM

## 2023-05-21 LAB — POCT GLYCOSYLATED HEMOGLOBIN (HGB A1C): HbA1c, POC (controlled diabetic range): 7.8 % — AB (ref 0.0–7.0)

## 2023-05-21 MED ORDER — INSULIN GLARGINE 100 UNIT/ML SOLOSTAR PEN
30.0000 [IU] | PEN_INJECTOR | Freq: Every day | SUBCUTANEOUS | 6 refills | Status: DC
Start: 2023-05-21 — End: 2023-09-02

## 2023-05-21 MED ORDER — VALSARTAN 80 MG PO TABS
80.0000 mg | ORAL_TABLET | Freq: Every day | ORAL | 1 refills | Status: DC
Start: 2023-05-21 — End: 2023-12-02

## 2023-05-21 MED ORDER — HYDROCHLOROTHIAZIDE 25 MG PO TABS
25.0000 mg | ORAL_TABLET | Freq: Every day | ORAL | 1 refills | Status: DC
Start: 2023-05-21 — End: 2023-09-02

## 2023-05-21 MED ORDER — TIRZEPATIDE 5 MG/0.5ML ~~LOC~~ SOAJ: 5.0000 mg | SUBCUTANEOUS | 6 refills | Status: DC

## 2023-05-21 MED ORDER — GABAPENTIN 300 MG PO CAPS
300.0000 mg | ORAL_CAPSULE | Freq: Every day | ORAL | 1 refills | Status: DC
Start: 2023-05-21 — End: 2023-12-02

## 2023-05-21 NOTE — Patient Instructions (Signed)
 Managing Hot Flashes During Menopause You will learn what hot flashes/night sweats are, what causes them and what the treatment methods are. To view the content, go to this web address: [email or web address]  This video will expire on: 01/20/2025. If you need access to this video following this date, please reach out to the healthcare provider who assigned it to you. This information is not intended to replace advice given to you by your health care provider. Make sure you discuss any questions you have with your health care provider. Elsevier Patient Education  2024 [employer].

## 2023-05-21 NOTE — Progress Notes (Unsigned)
Subjective:  Patient ID: Nicole [name], female    DOB: 1976/07/08  Age: 47 y.o. MRN: [id number]  CC: Medical Management of Chronic Issues   HPI Nicole [name] is a 47 y.o. year old female with a history of morbid obesity, Type 2 DM (A1c 7.8), diabetic neuropathy osteoarthritis of the knee, tobacco abuse.   Interval History: Discussed the use of AI scribe software for clinical note transcription with the patient, who gave verbal consent to proceed.  She reports a significant improvement in her A1c, down from 9.1 to 7.8, which she attributes to diligent use of her medication regimen and lifestyle modifications. However, she admits to a period of non-compliance with her medication, specifically Mounjaro, due to the recent death of her fiance. She reports a lack of appetite and increased thirst since the bereavement. She has resumed her medication regimen and is seeking a refill of Mounjaro. Doses adherence with her statin and her antihypertensive.  The patient also reports experiencing severe hot flashes, which she has been managing without medication. She expresses interest in potential treatments, including hormonal patches.  The patient is dealing with significant emotional distress following her fiance's death. She has been focusing on work and her passion for homeless outreach as coping mechanisms but expresses concern about potential burnout. She is considering grief counseling but wishes to work through her anger first.      She is under cardiology care for previous finding of right bundle branch block on EKG.  Cardiologist recommended echocardiogram (which revealed EF of 55 to 60%, grade 1 DD, severe LVH) and possible evaluation for sleep apnea but per patient she does not think she has sleep apnea she denies presence of symptoms.  Past Medical History:  Diagnosis Date   Arthritis    knees   Diabetes mellitus without complication (HCC)    Hyperlipidemia    Hypertension     Ovarian cyst    Pregnancy induced hypertension    Type 2 diabetes mellitus (HCC) 01/08/2018    Past Surgical History:  Procedure Laterality Date   APPENDECTOMY     CESAREAN SECTION     HERNIA REPAIR     LAPAROSCOPIC APPENDECTOMY N/A 03/25/2014   Procedure: APPENDECTOMY LAPAROSCOPIC;  Surgeon: Cherylynn Ridges, MD;  Location: [facility] OR;  Service: General;  Laterality: N/A;    Family History  Problem Relation Age of Onset   Hypertension Mother    Cancer Mother        pancreatic cancer   Arthritis Father    Heart disease Maternal Grandmother     Social History   Socioeconomic History   Marital status: Single    Spouse name: Not on file   Number of children: 2   Years of education: Not on file   Highest education level: Not on file  Occupational History   Not on file  Tobacco Use   Smoking status: Former    Current packs/day: 0.00    Average packs/day: 0.5 packs/day for 27.0 years (13.5 ttl pk-yrs)    Types: Cigarettes    Start date: 10/28/1993    Quit date: 10/28/2020    Years since quitting: 2.5   Smokeless tobacco: Never   Tobacco comments:    6 cigs daily  Vaping Use   Vaping status: Never Used  Substance and Sexual Activity   Alcohol use: Not Currently    Comment: occ   Drug use: Yes    Types: Marijuana    Comment: periodically   Sexual  activity: Not Currently    Comment: 5 years  Other Topics Concern   Not on file  Social History Narrative   Not on file   Social Determinants of Health   Financial Resource Strain: Low Risk  (11/12/2022)   Overall Financial Resource Strain (CARDIA)    Difficulty of Paying Living Expenses: Not very hard  Food Insecurity: No Food Insecurity (11/12/2022)   Hunger Vital Sign    Worried About Running Out of Food in the Last Year: Never true    Ran Out of Food in the Last Year: Never true  Transportation Needs: No Transportation Needs (05/24/2020)   PRAPARE - Administrator, Civil Service (Medical): No    Lack of  Transportation (Non-Medical): No  Physical Activity: Inactive (11/12/2022)   Exercise Vital Sign    Days of Exercise per Week: 0 days    Minutes of Exercise per Session: 0 min  Stress: No Stress Concern Present (11/12/2022)   [employer] of Occupational Health - Occupational Stress Questionnaire    Feeling of Stress : Not at all  Social Connections: Moderately Integrated (11/12/2022)   Social Connection and Isolation Panel [NHANES]    Frequency of Communication with Friends and Family: More than three times a week    Frequency of Social Gatherings with Friends and Family: More than three times a week    Attends Religious Services: 1 to 4 times per year    Active Member of [employer] or Organizations: No    Attends Banker Meetings: Never    Marital Status: Living with partner    No Known Allergies  Outpatient Medications Prior to Visit  Medication Sig Dispense Refill   Accu-Chek Softclix Lancets lancets Use as instructed tid before meals 100 each 12   albuterol (VENTOLIN HFA) 108 (90 Base) MCG/ACT inhaler Inhale 2 puffs into the lungs every 6 (six) hours as needed for wheezing or shortness of breath. 18 g 6   atorvastatin (LIPITOR) 20 MG tablet TAKE 1 TABLET(20 MG) BY MOUTH DAILY 90 tablet 1   blood glucose meter kit and supplies KIT Dispense based on patient and insurance preference. Use up to four times daily as directed. 1 each 0   budesonide-formoterol (SYMBICORT) 160-4.5 MCG/ACT inhaler Inhale 2 puffs into the lungs daily. 1 each 0   conjugated estrogens (PREMARIN) vaginal cream Place 0.5 grams vaginally 2 (two) times a week. For 4 weeks 42.5 g 0   fluconazole (DIFLUCAN) 150 MG tablet Take 150 mg by mouth once.     glucose blood (ACCU-CHEK GUIDE) test strip Use as instructed 100 each 12   glucose blood (FREESTYLE LITE) test strip Use as instructed 100 each 12   Insulin Pen Needle (PEN NEEDLES) 32G X 4 MM MISC Use to inject insulin once daily. 100 each 6   lidocaine  (LIDODERM) 5 % Place 1 patch onto the skin daily. Remove & Discard patch within 12 hours or as directed by MD 30 patch 1   meloxicam (MOBIC) 7.5 MG tablet Take 1 tablet (7.5 mg total) by mouth daily. 30 tablet 1   tiotropium (SPIRIVA HANDIHALER) 18 MCG inhalation capsule Place 1 capsule (18 mcg total) into inhaler and inhale daily. 30 capsule 6   tirzepatide (MOUNJARO) 2.5 MG/0.5ML Pen Inject 2.5 mg into the skin once a week. 2 mL 0   topiramate (TOPAMAX) 25 MG tablet Take 1 tablet (25 mg total) by mouth 2 (two) times daily. 60 tablet 3   triamcinolone cream (KENALOG)  0.1 % Apply 1 Application topically 2 (two) times daily. 45 g 1   gabapentin (NEURONTIN) 300 MG capsule Take 1 capsule (300 mg total) by mouth at bedtime. 30 capsule 3   hydrochlorothiazide (HYDRODIURIL) 25 MG tablet TAKE 1 TABLET(25 MG) BY MOUTH DAILY 90 tablet 0   insulin glargine (LANTUS) 100 UNIT/ML Solostar Pen Inject 30 Units into the skin daily. 15 mL 3   tirzepatide (MOUNJARO) 5 MG/0.5ML Pen Inject 5 mg into the skin once a week. 6 mL 0   valsartan (DIOVAN) 80 MG tablet TAKE 1 TABLET(80 MG) BY MOUTH DAILY 90 tablet 1   No facility-administered medications prior to visit.     ROS Review of Systems  Constitutional:  Negative for activity change and appetite change.  HENT:  Negative for sinus pressure and sore throat.   Respiratory:  Negative for chest tightness, shortness of breath and wheezing.   Cardiovascular:  Negative for chest pain and palpitations.  Gastrointestinal:  Negative for abdominal distention, abdominal pain and constipation.  Genitourinary: Negative.   Musculoskeletal: Negative.   Psychiatric/Behavioral:  Negative for behavioral problems and dysphoric mood.     Objective:  BP (!) 144/86   Pulse 77   Ht 5\' 8"  (1.727 m)   Wt (!) 335 lb 9.6 oz (152.2 kg)   SpO2 99%   BMI 51.03 kg/m      05/21/2023    3:49 PM 05/21/2023    3:19 PM 04/22/2023    4:05 PM  BP/Weight  Systolic BP 144 143 147   Diastolic BP 86 89 89  Wt. (Lbs)  335.6   BMI  51.03 kg/m2       Physical Exam Constitutional:      Appearance: She is well-developed.  Cardiovascular:     Rate and Rhythm: Normal rate.     Heart sounds: Normal heart sounds. No murmur heard. Pulmonary:     Effort: Pulmonary effort is normal.     Breath sounds: Normal breath sounds. No wheezing or rales.  Chest:     Chest wall: No tenderness.  Abdominal:     General: Bowel sounds are normal. There is no distension.     Palpations: Abdomen is soft. There is no mass.     Tenderness: There is no abdominal tenderness.  Musculoskeletal:        General: Normal range of motion.     Right lower leg: No edema.     Left lower leg: No edema.  Neurological:     Mental Status: She is alert and oriented to person, place, and time.  Psychiatric:        Mood and Affect: Mood normal.        Latest Ref Rng & Units 04/22/2023    1:51 PM 04/11/2023    6:52 PM 10/08/2022    1:45 PM  CMP  Glucose 70 - 99 mg/dL 086  578  469   BUN 6 - 20 mg/dL 11  13  16    Creatinine 0.44 - 1.00 mg/dL 6.29  5.28  4.13   Sodium 135 - 145 mmol/L 136  137  141   Potassium 3.5 - 5.1 mmol/L 3.5  4.2  5.1   Chloride 98 - 111 mmol/L 103  99  102   CO2 22 - 32 mmol/L 25  22  23    Calcium 8.9 - 10.3 mg/dL 8.7  9.7  9.6   Total Protein 6.5 - 8.1 g/dL 6.8  7.4    Total Bilirubin 0.3 -  1.2 mg/dL 0.6  0.4    Alkaline Phos 38 - 126 U/L 101  111    AST 15 - 41 U/L 34  43    ALT 0 - 44 U/L 41  39      Lipid Panel     Component Value Date/Time   CHOL 199 11/23/2021 1223   TRIG 161 (H) 11/23/2021 1223   HDL 53 11/23/2021 1223   CHOLHDL 3.8 11/23/2021 1223   CHOLHDL 5.1 02/13/2021 0932   VLDL 26 02/13/2021 0932   LDLCALC 118 (H) 11/23/2021 1223    CBC    Component Value Date/Time   WBC 8.0 04/22/2023 1148   RBC 5.04 04/22/2023 1148   HGB 13.2 04/22/2023 1148   HGB 12.6 02/28/2021 1629   HCT 42.0 04/22/2023 1148   HCT 38.4 02/28/2021 1629   PLT 256  04/22/2023 1148   PLT 265 02/28/2021 1629   MCV 83.3 04/22/2023 1148   MCV 82 02/28/2021 1629   MCH 26.2 04/22/2023 1148   MCHC 31.4 04/22/2023 1148   RDW 13.9 04/22/2023 1148   RDW 13.4 02/28/2021 1629   LYMPHSABS 1.9 04/22/2023 1148   LYMPHSABS 2.8 02/28/2021 1629   MONOABS 0.4 04/22/2023 1148   EOSABS 0.1 04/22/2023 1148   EOSABS 0.2 02/28/2021 1629   BASOSABS 0.0 04/22/2023 1148   BASOSABS 0.1 02/28/2021 1629    Lab Results  Component Value Date   HGBA1C 7.8 (A) 05/21/2023    Assessment & Plan:      Type 2 Diabetes Mellitus Improved glycemic control with A1c of 7.8, down from 9.1. Patient has been inconsistent with Mounjaro injection due to personal circumstances but has resumed  -Continue Lantus 30 units daily. -Check A1c in 3 months to assess impact of medication adherence. -Counseled on Diabetic diet, my plate method, 638 minutes of moderate intensity exercise/week Blood sugar logs with fasting goals of 80-120 mg/dl, random of less than 756 and in the event of sugars less than 60 mg/dl or greater than 433 mg/dl encouraged to notify the clinic. Advised on the need for annual eye exams, annual foot exams, Pneumonia vaccine.   Hypertension Elevated blood pressure at today's visit, potentially related to emotional stress. -Continue current antihypertensive regimen. -Recheck blood pressure at next visit.  Trichomoniasis Completed treatment, no current symptoms. -Perform swab test to confirm resolution of infection.  Menopausal Symptoms Reports severe hot flashes. -Consider non-hormonal medication for hot flash management. -If non-hormonal medication is ineffective, consider referral to gynecology for potential hormone therapy.  Right Bundle Branch Block Noted on previous EKG, potential association with COPD and possible sleep apnea. -Recommend follow-up with cardiologist to discuss findings and potential need for sleep study.  Grief/Bereavement Recent loss of  fiance, significant emotional distress. -Consider grief counseling when patient feels ready. -Continue to monitor for impact on physical health and medication adherence.          Meds ordered this encounter  Medications   gabapentin (NEURONTIN) 300 MG capsule    Sig: Take 1 capsule (300 mg total) by mouth at bedtime.    Dispense:  90 capsule    Refill:  1   hydrochlorothiazide (HYDRODIURIL) 25 MG tablet    Sig: Take 1 tablet (25 mg total) by mouth daily.    Dispense:  90 tablet    Refill:  1   insulin glargine (LANTUS) 100 UNIT/ML Solostar Pen    Sig: Inject 30 Units into the skin daily.    Dispense:  15 mL  Refill:  6   tirzepatide (MOUNJARO) 5 MG/0.5ML Pen    Sig: Inject 5 mg into the skin once a week.    Dispense:  6 mL    Refill:  6   valsartan (DIOVAN) 80 MG tablet    Sig: Take 1 tablet (80 mg total) by mouth daily.    Dispense:  90 tablet    Refill:  1    Follow-up: Return in about 3 months (around 08/21/2023) for Chronic medical conditions.       Hoy Register, MD, FAAFP. [facility] [place], [place] [phone]   05/21/2023, 6:36 PM

## 2023-05-22 ENCOUNTER — Other Ambulatory Visit: Payer: Self-pay

## 2023-05-22 LAB — CERVICOVAGINAL ANCILLARY ONLY
Bacterial Vaginitis (gardnerella): NEGATIVE
Candida Glabrata: NEGATIVE
Candida Vaginitis: NEGATIVE
Chlamydia: NEGATIVE
Comment: NEGATIVE
Comment: NEGATIVE
Comment: NEGATIVE
Comment: NEGATIVE
Comment: NEGATIVE
Comment: NORMAL
Neisseria Gonorrhea: NEGATIVE
Trichomonas: NEGATIVE

## 2023-05-23 ENCOUNTER — Other Ambulatory Visit: Payer: Self-pay

## 2023-05-27 ENCOUNTER — Telehealth: Payer: Self-pay | Admitting: Family Medicine

## 2023-05-27 NOTE — Telephone Encounter (Signed)
Pt is calling to report that she has discussed headaches that she gets with her provider. Pt reports that she has signed a contract to get monthly messages. But when she gets a message she gets a headache. Pt is calling to request a letter to her message company stating that she gets headaches when she gets a message to end the contract. Appt was 06/12/2023. Is appt still needed? Please advise

## 2023-05-29 NOTE — Telephone Encounter (Signed)
Unfortunately I do not write such letters.

## 2023-05-29 NOTE — Telephone Encounter (Signed)
Left message on voicemail to return call.

## 2023-05-29 NOTE — Telephone Encounter (Signed)
Called patient to get clarity on message.   She stated: Every time she gets a massage, she has a headache afterwards. Would like to know if a letter could be written from Dr. Alvis Lemmings to assist her with ending the contract.   Please advise.

## 2023-05-31 ENCOUNTER — Telehealth: Payer: Self-pay

## 2023-05-31 NOTE — Telephone Encounter (Signed)
Contacted patient and let her know as long as she was not having any GI symptoms or had any 1st degree relatives that had colon cancer she is fine to do Cologuard for her colon cancer screening. Patient stated that she would contact PCP to have Cologuard sent to her.

## 2023-05-31 NOTE — Telephone Encounter (Signed)
Spoke with patient regarding scheduling a colonoscopy at the hospital on 11/25. Patient stated that she did not want to be put to sleep and would rather do a stool test like cologuard. Please advise.

## 2023-06-06 IMAGING — DX DG CHEST 1V PORT
1 series · 1 of 1 positions shown · non-contrast
Comparison: 05/11/2020

CLINICAL DATA: Tachypnea, chest pressure

EXAM:
PORTABLE CHEST 1 VIEW

[chest ap]
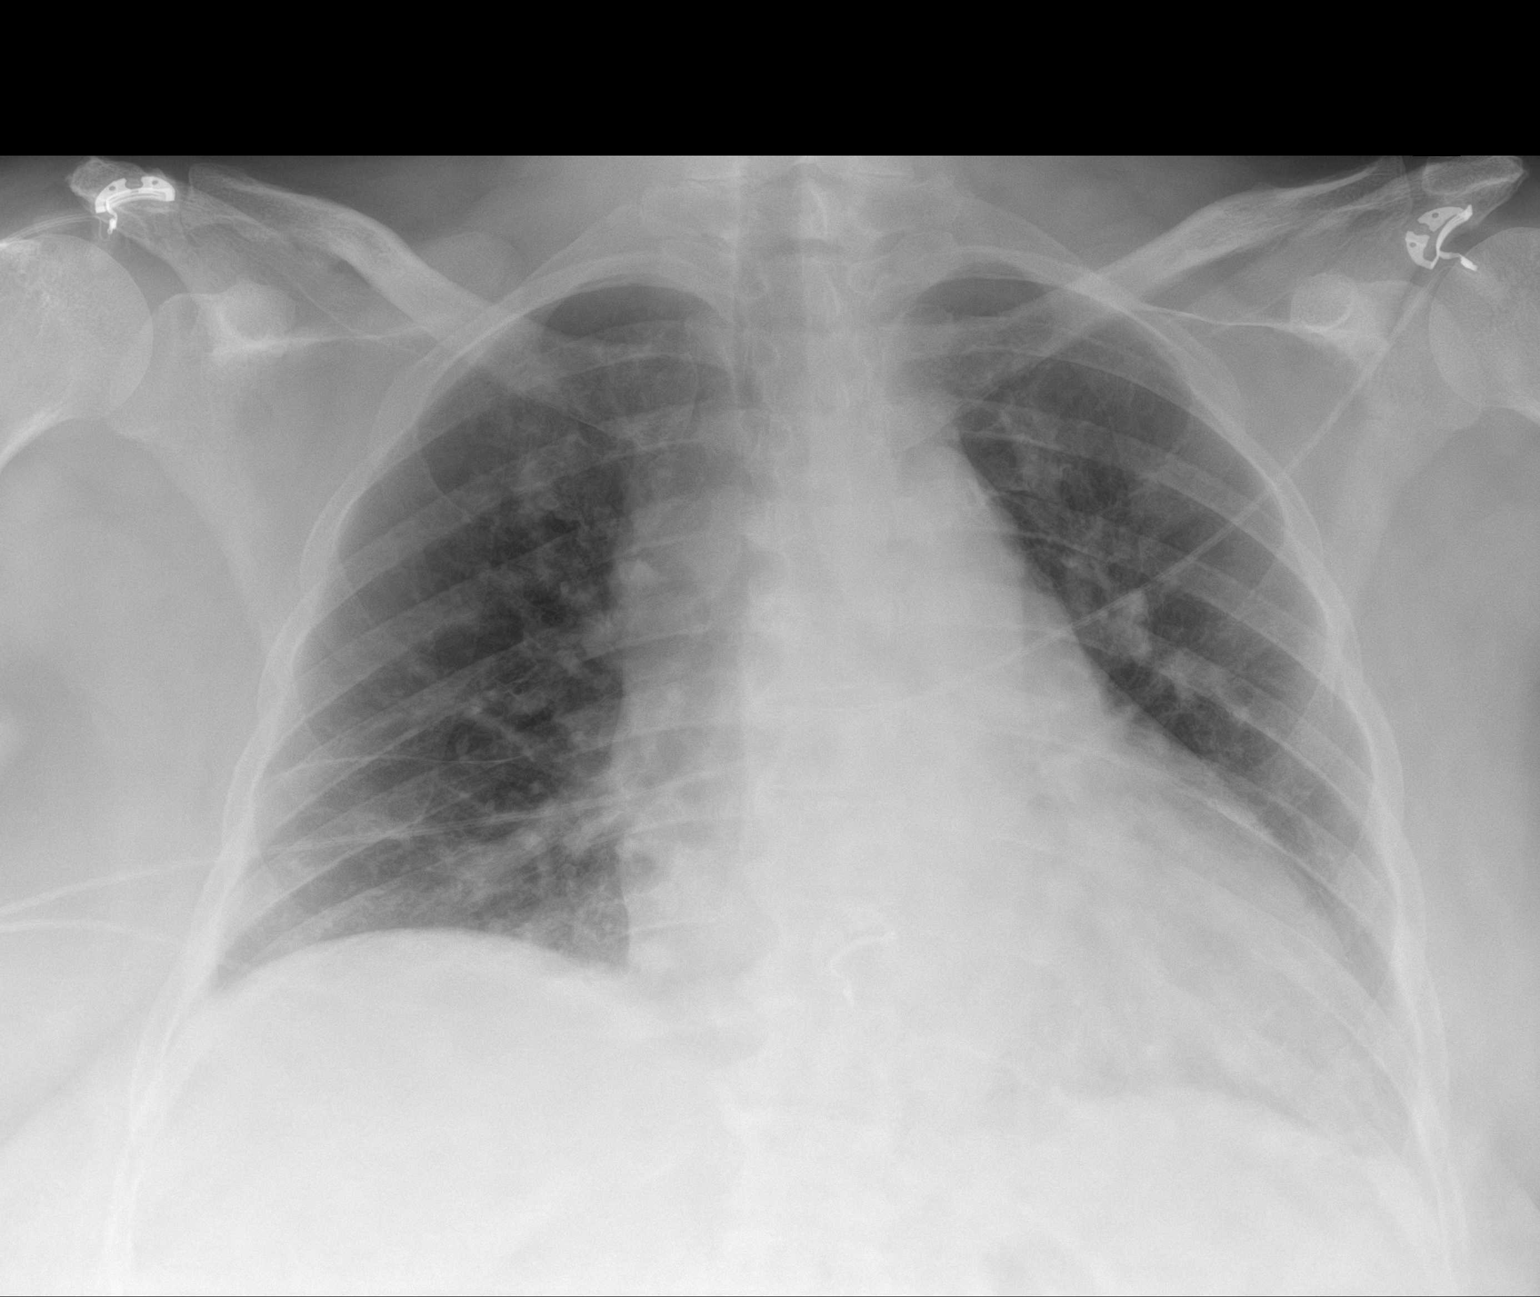

[1 of 1 positions shown; findings below may reference images not displayed]

FINDINGS: Single frontal view of the chest demonstrates mild enlargement the
cardiac silhouette, likely accentuated by portable AP positioning.
No airspace disease, effusion, or pneumothorax. No acute bony
abnormalities.
IMPRESSION: 1. No acute intrathoracic process.

## 2023-06-12 ENCOUNTER — Telehealth: Payer: Medicaid Other | Admitting: Physician Assistant

## 2023-06-21 ENCOUNTER — Ambulatory Visit (HOSPITAL_COMMUNITY)
Admission: EM | Admit: 2023-06-21 | Discharge: 2023-06-21 | Disposition: A | Discharge status: Home / Self Care | Payer: Medicaid Other | Attending: Family Medicine | Admitting: Family Medicine

## 2023-06-21 ENCOUNTER — Encounter (HOSPITAL_COMMUNITY): Payer: Self-pay

## 2023-06-21 ENCOUNTER — Ambulatory Visit (INDEPENDENT_AMBULATORY_CARE_PROVIDER_SITE_OTHER): Payer: Medicaid Other

## 2023-06-21 DIAGNOSIS — J069 Acute upper respiratory infection, unspecified: Secondary | ICD-10-CM

## 2023-06-21 DIAGNOSIS — R059 Cough, unspecified: Secondary | ICD-10-CM | POA: Diagnosis not present

## 2023-06-21 DIAGNOSIS — J9801 Acute bronchospasm: Secondary | ICD-10-CM

## 2023-06-21 DIAGNOSIS — J449 Chronic obstructive pulmonary disease, unspecified: Secondary | ICD-10-CM

## 2023-06-21 DIAGNOSIS — J441 Chronic obstructive pulmonary disease with (acute) exacerbation: Secondary | ICD-10-CM | POA: Diagnosis not present

## 2023-06-21 DIAGNOSIS — R0602 Shortness of breath: Secondary | ICD-10-CM | POA: Diagnosis not present

## 2023-06-21 LAB — POC COVID19/FLU A&B COMBO
Covid Antigen, POC: NEGATIVE
Influenza A Antigen, POC: NEGATIVE
Influenza B Antigen, POC: NEGATIVE

## 2023-06-21 MED ORDER — PREDNISONE 20 MG PO TABS: 40.0000 mg | ORAL_TABLET | Freq: Every day | ORAL | 0 refills | Status: AC

## 2023-06-21 MED ORDER — ALBUTEROL SULFATE HFA 108 (90 BASE) MCG/ACT IN AERS: 2.0000 | INHALATION_SPRAY | Freq: Four times a day (QID) | RESPIRATORY_TRACT | 0 refills | Status: AC | PRN

## 2023-06-21 NOTE — ED Triage Notes (Signed)
SOB, Body Aches, Congestion, Headache, Sore Throat. Onset yesterday worse over night, no known sick exposure. No respiratory history, no cough. History of hypertension.   Patient has not taken any meds for her symptoms. Did take a cough drop with no relief.

## 2023-06-21 NOTE — ED Provider Notes (Addendum)
[facility]    CSN: [id number] Arrival date & time: 06/21/23  1559      History   Chief Complaint Chief Complaint  Patient presents with   Cough   Shortness of Breath    HPI Nicole [name] is a 47 y.o. female.    Cough Associated symptoms: shortness of breath   Shortness of Breath Associated symptoms: cough   Here for cough and shortness of breath and congestion.  She started feeling this way for the most part yesterday but she did have some coughing spells the day before.  She states this is the way she felt when she had COVID  No vomiting or diarrhea.  No fever or chills   Past Medical History:  Diagnosis Date   Arthritis    knees   Diabetes mellitus without complication (HCC)    Hyperlipidemia    Hypertension    Ovarian cyst    Pregnancy induced hypertension    Type 2 diabetes mellitus (HCC) 01/08/2018    Patient Active Problem List   Diagnosis Date Noted   History of diverticulitis 02/28/2021   Antibiotic-induced yeast infection 02/28/2021   Thrombocytopenia (HCC) 02/28/2021   Hypokalemia 02/28/2021   Sigmoid diverticulitis 02/11/2021   Bacterial vaginitis 12/19/2020   Anxiety and depression 06/11/2018   Vitamin D deficiency 01/08/2018   Osteoarthritis 01/08/2018   Type 2 diabetes mellitus (HCC) 01/08/2018   Acute appendicitis 03/25/2014   Hypertension 03/25/2014   Morbid obesity (HCC) 03/24/2014   Benign essential HTN 03/24/2014   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   Chronic back pain 03/24/2014   Primary osteoarthritis of both knees 03/24/2014   Tobacco use 03/24/2014    Past Surgical History:  Procedure Laterality Date   APPENDECTOMY     CESAREAN SECTION     HERNIA REPAIR     LAPAROSCOPIC APPENDECTOMY N/A 03/25/2014   Procedure: APPENDECTOMY LAPAROSCOPIC;  Surgeon: Cherylynn Ridges, MD;  Location: [facility];  Service: General;  Laterality: N/A;    OB History     Gravida  2   Para  2   Term  1   Preterm  1   AB  0   Living   2      SAB      IAB      Ectopic      Multiple      Live Births  2            Home Medications    Prior to Admission medications   Medication Sig Start Date End Date Taking? Authorizing Provider  atorvastatin (LIPITOR) 20 MG tablet TAKE 1 TABLET(20 MG) BY MOUTH DAILY 04/04/23  Yes Newlin, Enobong, MD  insulin glargine (LANTUS) 100 UNIT/ML Solostar Pen Inject 30 Units into the skin daily. 05/21/23  Yes Hoy Register, MD  predniSONE (DELTASONE) 20 MG tablet Take 2 tablets (40 mg total) by mouth daily with breakfast for 3 days. 06/21/23 06/24/23 Yes BanisterJanace Aris, MD  tirzepatide [facility]) 5 MG/0.5ML Pen Inject 5 mg into the skin once a week. 05/21/23  Yes Hoy Register, MD  Accu-Chek Softclix Lancets lancets Use as instructed tid before meals 04/03/22   Hoy Register, MD  albuterol (VENTOLIN HFA) 108 (90 Base) MCG/ACT inhaler Inhale 2 puffs into the lungs every 6 (six) hours as needed for wheezing or shortness of breath. 06/21/23   Zenia Resides, MD  blood glucose meter kit and supplies KIT Dispense based on patient and insurance preference. Use up to four  times daily as directed. 02/15/21   Azucena Fallen, MD  budesonide-formoterol [facility]) 160-4.5 MCG/ACT inhaler Inhale 2 puffs into the lungs daily. 08/28/21   LampteyBritta Mccreedy, MD  conjugated estrogens (PREMARIN) vaginal cream Place 0.5 grams vaginally 2 (two) times a week. For 4 weeks 02/11/23   Hoy Register, MD  fluconazole (DIFLUCAN) 150 MG tablet Take 150 mg by mouth once. 10/25/22   [provider]  gabapentin (NEURONTIN) 300 MG capsule Take 1 capsule (300 mg total) by mouth at bedtime. 05/21/23   Hoy Register, MD  glucose blood (ACCU-CHEK GUIDE) test strip Use as instructed 11/23/21   Marcine Matar, MD  glucose blood (FREESTYLE LITE) test strip Use as instructed 09/06/21   Edwin Dada P, DO  hydrochlorothiazide (HYDRODIURIL) 25 MG tablet Take 1 tablet (25 mg total) by mouth daily.  05/21/23   Hoy Register, MD  Insulin Pen Needle (PEN NEEDLES) 32G X 4 MM MISC Use to inject insulin once daily. 01/09/23   Hoy Register, MD  lidocaine (LIDODERM) 5 % Place 1 patch onto the skin daily. Remove & Discard patch within 12 hours or as directed by MD 04/03/22   Hoy Register, MD  meloxicam (MOBIC) 7.5 MG tablet Take 1 tablet (7.5 mg total) by mouth daily. 07/16/22   Hoy Register, MD  tiotropium (SPIRIVA HANDIHALER) 18 MCG inhalation capsule Place 1 capsule (18 mcg total) into inhaler and inhale daily. 06/15/19   Hoy Register, MD  tirzepatide [facility]) 2.5 MG/0.5ML Pen Inject 2.5 mg into the skin once a week. 02/01/23   Hoy Register, MD  topiramate (TOPAMAX) 25 MG tablet Take 1 tablet (25 mg total) by mouth 2 (two) times daily. 04/03/22   Hoy Register, MD  triamcinolone cream (KENALOG) 0.1 % Apply 1 Application topically 2 (two) times daily. 01/08/23   Hoy Register, MD  valsartan (DIOVAN) 80 MG tablet Take 1 tablet (80 mg total) by mouth daily. 05/21/23   Hoy Register, MD  metoprolol tartrate (LOPRESSOR) 100 MG tablet Take 1 tablet by mouth once for procedure. 04/05/21   O'Neal, Ronnald Ramp, MD    Family History Family History  Problem Relation Age of Onset   Hypertension Mother    Cancer Mother        pancreatic cancer   Arthritis Father    Heart disease Maternal Grandmother     Social History Social History   Tobacco Use   Smoking status: Former    Current packs/day: 0.00    Average packs/day: 0.5 packs/day for 27.0 years (13.5 ttl pk-yrs)    Types: Cigarettes    Start date: 10/28/1993    Quit date: 10/28/2020    Years since quitting: 2.6   Smokeless tobacco: Never   Tobacco comments:    6 cigs daily  Vaping Use   Vaping status: Never Used  Substance Use Topics   Alcohol use: Not Currently    Comment: occ   Drug use: Yes    Types: Marijuana    Comment: periodically     Allergies   Patient has no known allergies.   Review of Systems Review  of Systems  Respiratory:  Positive for cough and shortness of breath.      Physical Exam Triage Vital Signs ED Triage Vitals [06/21/23 1604]  Encounter Vitals Group     BP (!) 160/101     Systolic BP Percentile      Diastolic BP Percentile      Pulse Rate 70     Resp 20  Temp 98.2 F (36.8 C)     Temp Source Oral     SpO2 99 %     Weight      Height      Head Circumference      Peak Flow      Pain Score      Pain Loc      Pain Education      Exclude from Growth Chart    No data found.  Updated Vital Signs BP (!) 160/101 (BP Location: Left Arm)   Pulse 70   Temp 98.2 F (36.8 C) (Oral)   Resp 20   LMP  (LMP Unknown)   SpO2 99%   Visual Acuity Right Eye Distance:   Left Eye Distance:   Bilateral Distance:    Right Eye Near:   Left Eye Near:    Bilateral Near:     Physical Exam Vitals reviewed.  Constitutional:      General: She is not in acute distress.    Appearance: She is not toxic-appearing.  HENT:     Right Ear: Tympanic membrane and ear canal normal.     Left Ear: Tympanic membrane and ear canal normal.     Nose: Congestion present.     Mouth/Throat:     Mouth: Mucous membranes are moist.     Pharynx: No oropharyngeal exudate or posterior oropharyngeal erythema.  Eyes:     Extraocular Movements: Extraocular movements intact.     Conjunctiva/sclera: Conjunctivae normal.     Pupils: Pupils are equal, round, and reactive to light.  Cardiovascular:     Rate and Rhythm: Normal rate and regular rhythm.     Heart sounds: No murmur heard. Pulmonary:     Effort: Pulmonary effort is normal. No respiratory distress.     Breath sounds: No stridor. No wheezing, rhonchi or rales.  Musculoskeletal:     Cervical back: Neck supple.  Lymphadenopathy:     Cervical: No cervical adenopathy.  Skin:    Capillary Refill: Capillary refill takes less than 2 seconds.     Coloration: Skin is not jaundiced or pale.  Neurological:     General: No focal deficit  present.     Mental Status: She is alert and oriented to person, place, and time.  Psychiatric:        Behavior: Behavior normal.      UC Treatments / Results  Labs (all labs ordered are listed, but only abnormal results are displayed) Labs Reviewed  POC COVID19/FLU A&B COMBO    EKG   Radiology DG Chest 2 View  Result Date: 06/21/2023 CLINICAL DATA:  Cough and shortness of breath for 1-2 days. EXAM: CHEST - 2 VIEW COMPARISON:  Chest radiographs 04/22/2023, 04/11/2023, 04/21/2022 FINDINGS: Cardiac silhouette is again mildly enlarged. Mediastinal contours are within normal limits. Improved aeration of the bilateral lungs compared to most recent 04/22/2023 radiographs. Resolution of the prior bilateral interstitial thickening. The interstitial markings now appear to be the patient's baseline seen on 04/11/2023 and 04/21/2022. No acute airspace opacity. No pleural effusion pneumothorax. Moderate multilevel degenerative disc changes of the thoracic spine. IMPRESSION: Improved aeration of the bilateral lungs compared to most recent 04/22/2023 radiographs. Resolution of the prior bilateral interstitial thickening. No acute airspace opacity. Electronically Signed   By: Neita Garnet M.D.   On: 06/21/2023 17:17    Procedures Procedures (including critical care time)  Medications Ordered in UC Medications - No data to display  Initial Impression / Assessment and Plan /  UC Course  I have reviewed the triage vital signs and the nursing notes.  Pertinent labs & imaging results that were available during my care of the patient were reviewed by me and considered in my medical decision making (see chart for details).     Chest x-ray negative.  Flu and COVID tests are negative.  Albuterol and prednisone are sent in for possible COPD exacerbation. Final Clinical Impressions(s) / UC Diagnoses   Final diagnoses:  Viral URI with cough  COPD exacerbation [facility])     Discharge Instructions       Your chest x-ray is read as normal.  There is no pneumonia or fluid.  Albuterol inhaler and 3 days of prednisone are sent in in case there is some bronchospasm causing your shortness of breath.  Your flu and COVID tests were negative.  Albuterol inhaler--do 2 puffs every 4 hours as needed for shortness of breath or wheezing  Take prednisone 20 mg--2 daily for 3 days      ED Prescriptions     Medication Sig Dispense Auth. Provider   albuterol (VENTOLIN HFA) 108 (90 Base) MCG/ACT inhaler Inhale 2 puffs into the lungs every 6 (six) hours as needed for wheezing or shortness of breath. 18 g Zenia Resides, MD   predniSONE (DELTASONE) 20 MG tablet Take 2 tablets (40 mg total) by mouth daily with breakfast for 3 days. 6 tablet Marlinda Mike Janace Aris, MD      PDMP not reviewed this encounter.   Zenia Resides, MD 06/21/23 1726    Zenia Resides, MD 06/21/23 [phone]

## 2023-06-21 NOTE — Discharge Instructions (Signed)
Your chest x-ray is read as normal.  There is no pneumonia or fluid.  Albuterol inhaler and 3 days of prednisone are sent in in case there is some bronchospasm causing your shortness of breath.  Your flu and COVID tests were negative.  Albuterol inhaler--do 2 puffs every 4 hours as needed for shortness of breath or wheezing  Take prednisone 20 mg--2 daily for 3 days

## 2023-07-03 NOTE — Progress Notes (Unsigned)
Cardiology Office Note:  .   Date:  07/04/2023  ID:  Nicole [name], DOB 11/23/1975, MRN [id number] PCP: Hoy Register, MD  [facility] Providers Cardiologist:  None { History of Present Illness: .   Nicole [name] is a 47 y.o. female with history of HTN, HLD, obesity, RBBB who presents for follow-up.    History of Present Illness   Miss Croushore, a 47 year old with a history of morbid obesity, diabetes, hypertension, hyperlipidemia, and tobacco abuse, presents for follow-up. She was recently seen in the emergency room for cough and shortness of breath, diagnosed with bronchospasm and possible bronchitis, and prescribed an albuterol inhaler and prednisone. She reports that her symptoms have improved, and she is feeling well overall.  She has been experiencing chest pain, which has been evaluated and determined to be non-cardiac. She reports that the sharp pains in her chest have decreased in frequency. She has been managing her hypertension with diet and exercise, and has successfully come off her blood pressure medication. She reports that she has lost weight and her blood pressure has improved. She is also taking Mounjaro, which she reports has helped curb her appetite.  She has a history of right bundle branch block, but she reports no current symptoms related to this condition. Normal echo.          Problem List DM -A1c 7.8 HTN HLD -T chol 199, HDL 53, LDL 118, TG 161 Morbid obesity -BMI 51 5. Tobacco abuse  6. RBBB    ROS: All other ROS reviewed and negative. Pertinent positives noted in the HPI.     Studies Reviewed: Marland Kitchen        TTE 02/12/2023  1. Left ventricular ejection fraction, by estimation, is 55 to 60%. Left  ventricular ejection fraction by 3D volume is 56 %. The left ventricle has  normal function. The left ventricle has no regional wall motion  abnormalities. There is severe concentric   left ventricular hypertrophy. Left ventricular diastolic  parameters are  consistent with Grade I diastolic dysfunction (impaired relaxation). The  average left ventricular global longitudinal strain is -22.0 %.   2. Right ventricular systolic function is normal. The right ventricular  size is normal. Tricuspid regurgitation signal is inadequate for assessing  PA pressure.   3. The mitral valve is normal in structure. Trivial mitral valve  regurgitation. No evidence of mitral stenosis.   4. The aortic valve is normal in structure. Aortic valve regurgitation is  not visualized. No aortic stenosis is present.   5. The inferior vena cava is normal in size with greater than 50%  respiratory variability, suggesting right atrial pressure of 3 mmHg.  Physical Exam:   VS:  BP 138/82 (BP Location: Left Arm, Patient Position: Sitting, Cuff Size: Large)   Pulse 78   Ht 5\' 8"  (1.727 m)   Wt (!) 334 lb (151.5 kg)   LMP  (LMP Unknown)   SpO2 95%   BMI 50.78 kg/m    Wt Readings from Last 3 Encounters:  07/04/23 (!) 334 lb (151.5 kg)  05/21/23 (!) 335 lb 9.6 oz (152.2 kg)  04/22/23 (!) 341 lb 14.9 oz (155.1 kg)    GEN: Well nourished, well developed in no acute distress NECK: No JVD; No carotid bruits CARDIAC: RRR, no murmurs, rubs, gallops RESPIRATORY:  Clear to auscultation without rales, wheezing or rhonchi  ABDOMEN: Soft, non-tender, non-distended EXTREMITIES:  No edema; No deformity  ASSESSMENT AND PLAN: .   Assessment and Plan  Morbid Obesity BMI 51, down from previous. Patient is actively working on weight loss through diet and exercise, and reports feeling more energetic. -Continue current weight loss efforts.  Hypertension Blood pressure 138/82 off medications, improved with weight loss. -Continue monitoring blood pressure and maintain current lifestyle modifications.  Diabetes Most recent A1c 7.8. Patient is working on improving this through lifestyle modifications. -Continue current efforts and monitor A1c levels.  Chest  Pain Non-cardiac chest pain, currently improved. -No further action required at this time.   Follow-up as needed              Follow-up: Return if symptoms worsen or fail to improve.  Time Spent with Patient: I have spent a total of 25 minutes caring for this patient today face to face, ordering and reviewing labs/tests, reviewing prior records/medical history, examining the patient, establishing an assessment and plan, communicating results/findings to the patient/family, and documenting in the medical record.   Signed, Lenna Gilford. Flora Lipps, MD, [facility]  [facility]  [address] [place], [place] [address] [phone]  9:16 AM

## 2023-07-04 ENCOUNTER — Encounter: Payer: Self-pay | Admitting: Cardiovascular Disease

## 2023-07-04 ENCOUNTER — Ambulatory Visit: Payer: Medicaid Other | Attending: Cardiovascular Disease | Admitting: Cardiovascular Disease

## 2023-07-04 VITALS — BP 138/82 | HR 78 | Ht 68.0 in | Wt 334.0 lb

## 2023-07-04 DIAGNOSIS — E782 Mixed hyperlipidemia: Secondary | ICD-10-CM | POA: Diagnosis not present

## 2023-07-04 DIAGNOSIS — R072 Precordial pain: Secondary | ICD-10-CM

## 2023-07-04 DIAGNOSIS — I4510 Unspecified right bundle-branch block: Secondary | ICD-10-CM | POA: Diagnosis not present

## 2023-07-04 DIAGNOSIS — I10 Essential (primary) hypertension: Secondary | ICD-10-CM

## 2023-07-04 NOTE — Patient Instructions (Signed)
Medication Instructions:  None    *If you need a refill on your cardiac medications before your next appointment, please call your pharmacy*   Lab Work: None    If you have labs (blood work) drawn today and your tests are completely normal, you will receive your results only by: MyChart Message (if you have MyChart) OR A paper copy in the mail If you have any lab test that is abnormal or we need to change your treatment, we will call you to review the results.   Testing/Procedures: None    Follow-Up: At [facility], you and your health needs are our priority.  As part of our continuing mission to provide you with exceptional heart care, we have created designated Provider Care Teams.  These Care Teams include your primary Cardiologist (physician) and Advanced Practice Providers (APPs -  Physician Assistants and Nurse Practitioners) who all work together to provide you with the care you need, when you need it.  We recommend signing up for the patient portal called "MyChart".  Sign up information is provided on this After Visit Summary.  MyChart is used to connect with patients for Virtual Visits (Telemedicine).  Patients are able to view lab/test results, encounter notes, upcoming appointments, etc.  Non-urgent messages can be sent to your provider as well.   To learn more about what you can do with MyChart, go to [email or web address].    Your next appointment:   Follow up as needed    Provider:   Lennie Odor, MD   Other Instructions

## 2023-07-08 ENCOUNTER — Ambulatory Visit: Payer: Medicaid Other | Admitting: Cardiovascular Disease

## 2023-07-12 ENCOUNTER — Encounter: Payer: Self-pay | Admitting: Family Medicine

## 2023-07-12 ENCOUNTER — Ambulatory Visit: Payer: Self-pay

## 2023-07-12 MED ORDER — TIRZEPATIDE 5 MG/0.5ML ~~LOC~~ SOAJ: 5.0000 mg | SUBCUTANEOUS | 1 refills | Status: DC

## 2023-07-12 NOTE — Telephone Encounter (Signed)
  Chief Complaint: Dosage increase - refill of mounjaro Symptoms: none Frequency: now Pertinent Negatives: Patient denies any side effects Disposition: [] ED /[] Urgent Care (no appt availability in office) / [] Appointment(In office/virtual)/ []  [place] [facility]/ [] Home Care/ [] Refused Recommended Disposition /[] [place] [facility]/ [x]  Follow-up with PCP Additional Notes: Returned pt's call. She is out of [facility] and needs a refill. Next shot is due this Sunday.  Pt would also like to increase to the next dosage. She states that at first she was having decreased appetite, but now her body has gotten used to the dose and it is not working as well. Pt reports some mild side effects the day after the shot, but that  they quickly resolve. ( She states she feels a little "funny".) Please advise.   Summary: Rx dosage increase requested   Pt requesting increase in dosage for mounjaro. Pt inquiring if she still has refills for the [facility] and if she needs appt for increase in dosage.  Patient requesting callback     Reason for Disposition  [1] Prescription refill request for ESSENTIAL medicine (i.e., likelihood of harm to patient if not taken) AND [2] triager unable to refill per department policy  Answer Assessment - Initial Assessment Questions 1. DRUG NAME: "What medicine do you need to have refilled?"     Mounjaro 2. REFILLS REMAINING: "How many refills are remaining?" (Note: The label on the medicine or pill bottle will show how many refills are remaining. If there are no refills remaining, then a renewal may be needed.)     none 4. PRESCRIBING HCP: "Who prescribed it?" Reason: If prescribed by specialist, call should be referred to that group.     Dr. Alvis Lemmings 5. SYMPTOMS: "Do you have any symptoms?"     no  Protocols used: Medication Refill and Renewal Call-A-AH

## 2023-07-15 ENCOUNTER — Other Ambulatory Visit: Payer: Self-pay

## 2023-07-15 NOTE — Telephone Encounter (Signed)
Rx was sent to Pharmacy 3 days ago

## 2023-07-22 ENCOUNTER — Telehealth: Payer: Self-pay | Admitting: Family Medicine

## 2023-07-22 MED ORDER — TIRZEPATIDE 7.5 MG/0.5ML ~~LOC~~ SOAJ: 7.5000 mg | SUBCUTANEOUS | 1 refills | Status: DC

## 2023-07-22 NOTE — Telephone Encounter (Signed)
Please see Luke's message.

## 2023-07-22 NOTE — Telephone Encounter (Signed)
Pt has completed 5 mg weekly of Mounjaro. Rxn sent for the 7.5 mg weekly dose. She needs an appt with Dr. Alvis Lemmings in January for more refills.

## 2023-07-22 NOTE — Telephone Encounter (Signed)
Pt requesting dosage increase for mounjaro. Pt called in regards to this on 07/12/2023 and did not hear back about increasing dosage.   Pt would like to know if appointment would be needed to increase dosage.   Pt requesting a call back

## 2023-07-22 NOTE — Telephone Encounter (Signed)
 Lvm for patient to return phone call.

## 2023-07-25 NOTE — Telephone Encounter (Signed)
Patient aware of appointment

## 2023-07-25 NOTE — Telephone Encounter (Signed)
Left message on voicemail to return call to schedule an appointment with Dr. Alvis Lemmings in January.

## 2023-07-30 ENCOUNTER — Ambulatory Visit (HOSPITAL_COMMUNITY)
Admission: EM | Admit: 2023-07-30 | Discharge: 2023-07-30 | Disposition: A | Discharge status: Home / Self Care | Payer: Medicaid Other | Attending: Family Medicine | Admitting: Family Medicine

## 2023-07-30 ENCOUNTER — Ambulatory Visit (INDEPENDENT_AMBULATORY_CARE_PROVIDER_SITE_OTHER): Payer: Medicaid Other

## 2023-07-30 ENCOUNTER — Encounter (HOSPITAL_COMMUNITY): Payer: Self-pay | Admitting: Emergency Medicine

## 2023-07-30 DIAGNOSIS — J069 Acute upper respiratory infection, unspecified: Secondary | ICD-10-CM

## 2023-07-30 DIAGNOSIS — R0602 Shortness of breath: Secondary | ICD-10-CM

## 2023-07-30 DIAGNOSIS — J189 Pneumonia, unspecified organism: Secondary | ICD-10-CM

## 2023-07-30 DIAGNOSIS — I517 Cardiomegaly: Secondary | ICD-10-CM | POA: Diagnosis not present

## 2023-07-30 DIAGNOSIS — R0600 Dyspnea, unspecified: Secondary | ICD-10-CM | POA: Diagnosis not present

## 2023-07-30 LAB — POC COVID19/FLU A&B COMBO
Covid Antigen, POC: NEGATIVE
Influenza A Antigen, POC: NEGATIVE
Influenza B Antigen, POC: NEGATIVE

## 2023-07-30 MED ORDER — DOXYCYCLINE HYCLATE 100 MG PO CAPS: 100.0000 mg | ORAL_CAPSULE | Freq: Two times a day (BID) | ORAL | 0 refills | Status: AC

## 2023-07-30 MED ORDER — BENZONATATE 100 MG PO CAPS: 100.0000 mg | ORAL_CAPSULE | Freq: Three times a day (TID) | ORAL | 0 refills | Status: DC | PRN

## 2023-07-30 NOTE — ED Provider Notes (Signed)
 [facility]    CSN: [id number] Arrival date & time: 07/30/23  1339      History   Chief Complaint Chief Complaint  Patient presents with   Chills   Sore Throat    HPI Nicole [name] is a 47 y.o. female.    Sore Throat  Here for cough and sore throat and congestion and shortness of breath.  Symptoms began yesterday.  She did try an inhaler she has at home but it did not help.  She states she has never been diagnosed with asthma but she gets short of breath sometimes and has been prescribed an inhaler  No fever but she has had some chills and some myalgia.  NKDA  Past Medical History:  Diagnosis Date   Arthritis    knees   Diabetes mellitus without complication (HCC)    Hyperlipidemia    Hypertension    Ovarian cyst    Pregnancy induced hypertension    Type 2 diabetes mellitus (HCC) 01/08/2018    Patient Active Problem List   Diagnosis Date Noted   History of diverticulitis 02/28/2021   Antibiotic-induced yeast infection 02/28/2021   Thrombocytopenia (HCC) 02/28/2021   Hypokalemia 02/28/2021   Sigmoid diverticulitis 02/11/2021   Bacterial vaginitis 12/19/2020   Anxiety and depression 06/11/2018   Vitamin D  deficiency 01/08/2018   Osteoarthritis 01/08/2018   Type 2 diabetes mellitus (HCC) 01/08/2018   Acute appendicitis 03/25/2014   Hypertension 03/25/2014   Morbid obesity (HCC) 03/24/2014   Benign essential HTN 03/24/2014   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   Chronic back pain 03/24/2014   Primary osteoarthritis of both knees 03/24/2014   Tobacco use 03/24/2014    Past Surgical History:  Procedure Laterality Date   APPENDECTOMY     CESAREAN SECTION     HERNIA REPAIR     LAPAROSCOPIC APPENDECTOMY N/A 03/25/2014   Procedure: APPENDECTOMY LAPAROSCOPIC;  Surgeon: Lynwood MALVA Pina, MD;  Location: [facility];  Service: General;  Laterality: N/A;    OB History     Gravida  2   Para  2   Term  1   Preterm  1   AB  0   Living  2      SAB       IAB      Ectopic      Multiple      Live Births  2            Home Medications    Prior to Admission medications   Medication Sig Start Date End Date Taking? Authorizing Provider  benzonatate  (TESSALON ) 100 MG capsule Take 1 capsule (100 mg total) by mouth 3 (three) times daily as needed for cough. 07/30/23  Yes Vonna Sharlet POUR, MD  doxycycline  (VIBRAMYCIN ) 100 MG capsule Take 1 capsule (100 mg total) by mouth 2 (two) times daily for 7 days. 07/30/23 08/06/23 Yes Vonna Sharlet POUR, MD  Accu-Chek Softclix Lancets lancets Use as instructed tid before meals Patient not taking: Reported on 07/04/2023 04/03/22   Newlin, Enobong, MD  albuterol  (VENTOLIN  HFA) 108 (90 Base) MCG/ACT inhaler Inhale 2 puffs into the lungs every 6 (six) hours as needed for wheezing or shortness of breath. 06/21/23   Vonna Sharlet POUR, MD  atorvastatin  (LIPITOR ) 20 MG tablet TAKE 1 TABLET(20 MG) BY MOUTH DAILY Patient not taking: Reported on 07/04/2023 04/04/23   Newlin, Enobong, MD  blood glucose meter kit and supplies KIT Dispense based on patient and insurance preference. Use up to four  times daily as directed. 02/15/21   Lue Elsie BROCKS, MD  budesonide -formoterol  (SYMBICORT ) 160-4.5 MCG/ACT inhaler Inhale 2 puffs into the lungs daily. Patient not taking: Reported on 07/04/2023 08/28/21   Blaise Aleene KIDD, MD  conjugated estrogens  (PREMARIN ) vaginal cream Place 0.5 grams vaginally 2 (two) times a week. For 4 weeks Patient not taking: Reported on 07/04/2023 02/11/23   Newlin, Enobong, MD  fluconazole  (DIFLUCAN ) 150 MG tablet Take 150 mg by mouth once. Patient not taking: Reported on 07/04/2023 10/25/22   [provider]  gabapentin  (NEURONTIN ) 300 MG capsule Take 1 capsule (300 mg total) by mouth at bedtime. 05/21/23   Delbert Clam, MD  glucose blood (ACCU-CHEK GUIDE) test strip Use as instructed 11/23/21   Vicci Barnie NOVAK, MD  glucose blood (FREESTYLE LITE) test strip Use as instructed 09/06/21    Elnor Hila P, DO  hydrochlorothiazide  (HYDRODIURIL ) 25 MG tablet Take 1 tablet (25 mg total) by mouth daily. Patient not taking: Reported on 07/04/2023 05/21/23   Newlin, Enobong, MD  insulin  glargine (LANTUS ) 100 UNIT/ML Solostar Pen Inject 30 Units into the skin daily. 05/21/23   Newlin, Enobong, MD  Insulin  Pen Needle (PEN NEEDLES) 32G X 4 MM MISC Use to inject insulin  once daily. 01/09/23   Newlin, Enobong, MD  lidocaine  (LIDODERM ) 5 % Place 1 patch onto the skin daily. Remove & Discard patch within 12 hours or as directed by MD Patient not taking: Reported on 07/04/2023 04/03/22   Newlin, Enobong, MD  meloxicam  (MOBIC ) 7.5 MG tablet Take 1 tablet (7.5 mg total) by mouth daily. Patient not taking: Reported on 07/04/2023 07/16/22   Newlin, Enobong, MD  tiotropium (SPIRIVA  HANDIHALER) 18 MCG inhalation capsule Place 1 capsule (18 mcg total) into inhaler and inhale daily. Patient not taking: Reported on 07/04/2023 06/15/19   Newlin, Enobong, MD  tirzepatide  (MOUNJARO ) 7.5 MG/0.5ML Pen Inject 7.5 mg into the skin once a week. 07/22/23   Newlin, Enobong, MD  topiramate  (TOPAMAX ) 25 MG tablet Take 1 tablet (25 mg total) by mouth 2 (two) times daily. Patient not taking: Reported on 07/04/2023 04/03/22   Newlin, Enobong, MD  triamcinolone  cream (KENALOG ) 0.1 % Apply 1 Application topically 2 (two) times daily. Patient not taking: Reported on 07/04/2023 01/08/23   Newlin, Enobong, MD  valsartan  (DIOVAN ) 80 MG tablet Take 1 tablet (80 mg total) by mouth daily. Patient not taking: Reported on 07/04/2023 05/21/23   Newlin, Enobong, MD  metoprolol  tartrate (LOPRESSOR ) 100 MG tablet Take 1 tablet by mouth once for procedure. 04/05/21   O'Neal, Darryle Ned, MD    Family History Family History  Problem Relation Age of Onset   Hypertension Mother    Cancer Mother        pancreatic cancer   Arthritis Father    Heart disease Maternal Grandmother     Social History Social History   Tobacco Use   Smoking  status: Former    Current packs/day: 0.00    Average packs/day: 0.5 packs/day for 27.0 years (13.5 ttl pk-yrs)    Types: Cigarettes    Start date: 10/28/1993    Quit date: 10/28/2020    Years since quitting: 2.7   Smokeless tobacco: Never   Tobacco comments:    6 cigs daily  Vaping Use   Vaping status: Never Used  Substance Use Topics   Alcohol use: Not Currently    Comment: occ   Drug use: Yes    Types: Marijuana    Comment: periodically  Allergies   Patient has no known allergies.   Review of Systems Review of Systems   Physical Exam Triage Vital Signs ED Triage Vitals  Encounter Vitals Group     BP 07/30/23 1430 (!) 174/75     Systolic BP Percentile --      Diastolic BP Percentile --      Pulse Rate 07/30/23 1430 68     Resp 07/30/23 1430 20     Temp 07/30/23 1430 97.8 F (36.6 C)     Temp Source 07/30/23 1430 Oral     SpO2 07/30/23 1430 98 %     Weight --      Height --      Head Circumference --      Peak Flow --      Pain Score 07/30/23 1429 9     Pain Loc --      Pain Education --      Exclude from Growth Chart --    No data found.  Updated Vital Signs BP (!) 174/75   Pulse 68   Temp 97.8 F (36.6 C) (Oral)   Resp 20   LMP  (LMP Unknown)   SpO2 98%   Visual Acuity Right Eye Distance:   Left Eye Distance:   Bilateral Distance:    Right Eye Near:   Left Eye Near:    Bilateral Near:     Physical Exam Vitals reviewed.  Constitutional:      General: She is not in acute distress.    Appearance: She is not ill-appearing, toxic-appearing or diaphoretic.  HENT:     Right Ear: Tympanic membrane and ear canal normal.     Left Ear: Tympanic membrane and ear canal normal.     Nose: Congestion present.     Mouth/Throat:     Mouth: Mucous membranes are moist.     Pharynx: No oropharyngeal exudate or posterior oropharyngeal erythema.  Eyes:     Extraocular Movements: Extraocular movements intact.     Conjunctiva/sclera: Conjunctivae normal.      Pupils: Pupils are equal, round, and reactive to light.  Cardiovascular:     Rate and Rhythm: Normal rate and regular rhythm.     Heart sounds: No murmur heard. Pulmonary:     Effort: No respiratory distress.     Breath sounds: No stridor. No wheezing, rhonchi or rales.  Musculoskeletal:     Cervical back: Neck supple.  Lymphadenopathy:     Cervical: No cervical adenopathy.  Skin:    Capillary Refill: Capillary refill takes less than 2 seconds.     Coloration: Skin is not jaundiced or pale.  Neurological:     General: No focal deficit present.     Mental Status: She is alert and oriented to person, place, and time.  Psychiatric:        Behavior: Behavior normal.      UC Treatments / Results  Labs (all labs ordered are listed, but only abnormal results are displayed) Labs Reviewed  POC COVID19/FLU A&B COMBO - Normal    EKG   Radiology No results found.  Procedures Procedures (including critical care time)  Medications Ordered in UC Medications - No data to display  Initial Impression / Assessment and Plan / UC Course  I have reviewed the triage vital signs and the nursing notes.  Pertinent labs & imaging results that were available during my care of the patient were reviewed by me and considered in my medical decision making (see chart  for details).      Flu and COVID test is negative. By my review, there is some consolidation in the right lower lobe on the cxr today, when compared to CXR from September. Doxycycline  is sent in to treat, along with tessalon  perles. Final Clinical Impressions(s) / UC Diagnoses   Final diagnoses:  Shortness of breath  Viral upper respiratory tract infection  Community acquired pneumonia of right lower lobe of lung     Discharge Instructions      The flu and COVID test was negative  By my review, the chest x-ray may show a developing pneumonia on your right lower lung.  Take doxycycline  100 mg --1 capsule 2 times  daily for 7 days  Take benzonatate  100 mg, 1 tab every 8 hours as needed for cough.       ED Prescriptions     Medication Sig Dispense Auth. Provider   doxycycline  (VIBRAMYCIN ) 100 MG capsule Take 1 capsule (100 mg total) by mouth 2 (two) times daily for 7 days. 14 capsule Shannen Vernon K, MD   benzonatate  (TESSALON ) 100 MG capsule Take 1 capsule (100 mg total) by mouth 3 (three) times daily as needed for cough. 21 capsule Chaka Jefferys K, MD      PDMP not reviewed this encounter.   Vonna Sharlet POUR, MD 07/30/23 [phone]

## 2023-07-30 NOTE — Discharge Instructions (Signed)
 The flu and COVID test was negative  By my review, the chest x-ray may show a developing pneumonia on your right lower lung.  Take doxycycline  100 mg --1 capsule 2 times daily for 7 days  Take benzonatate  100 mg, 1 tab every 8 hours as needed for cough.

## 2023-07-30 NOTE — ED Triage Notes (Signed)
Pt having chills, cough, sore throat, SOB that started yesterday. Used inhaler that didn't help. Took Cold-Ez and synergy.

## 2023-09-02 ENCOUNTER — Other Ambulatory Visit (HOSPITAL_COMMUNITY)
Admission: RE | Admit: 2023-09-02 | Discharge: 2023-09-02 | Disposition: A | Discharge status: Home / Self Care | Payer: Medicaid Other | Source: Ambulatory Visit | Attending: Family Medicine | Admitting: Family Medicine

## 2023-09-02 ENCOUNTER — Encounter: Payer: Self-pay | Admitting: Family Medicine

## 2023-09-02 ENCOUNTER — Ambulatory Visit: Payer: Medicaid Other | Attending: Family Medicine | Admitting: Family Medicine

## 2023-09-02 VITALS — BP 159/98 | HR 68 | Ht 68.0 in | Wt 322.0 lb

## 2023-09-02 DIAGNOSIS — R600 Localized edema: Secondary | ICD-10-CM | POA: Diagnosis not present

## 2023-09-02 DIAGNOSIS — N898 Other specified noninflammatory disorders of vagina: Secondary | ICD-10-CM

## 2023-09-02 DIAGNOSIS — E1159 Type 2 diabetes mellitus with other circulatory complications: Secondary | ICD-10-CM | POA: Diagnosis not present

## 2023-09-02 DIAGNOSIS — E785 Hyperlipidemia, unspecified: Secondary | ICD-10-CM

## 2023-09-02 DIAGNOSIS — Z7985 Long-term (current) use of injectable non-insulin antidiabetic drugs: Secondary | ICD-10-CM | POA: Diagnosis not present

## 2023-09-02 DIAGNOSIS — Z1231 Encounter for screening mammogram for malignant neoplasm of breast: Secondary | ICD-10-CM

## 2023-09-02 DIAGNOSIS — I152 Hypertension secondary to endocrine disorders: Secondary | ICD-10-CM

## 2023-09-02 DIAGNOSIS — Z91148 Patient's other noncompliance with medication regimen for other reason: Secondary | ICD-10-CM

## 2023-09-02 DIAGNOSIS — Z1211 Encounter for screening for malignant neoplasm of colon: Secondary | ICD-10-CM

## 2023-09-02 DIAGNOSIS — E1142 Type 2 diabetes mellitus with diabetic polyneuropathy: Secondary | ICD-10-CM | POA: Diagnosis not present

## 2023-09-02 DIAGNOSIS — E1169 Type 2 diabetes mellitus with other specified complication: Secondary | ICD-10-CM | POA: Diagnosis not present

## 2023-09-02 LAB — POCT GLYCOSYLATED HEMOGLOBIN (HGB A1C): HbA1c, POC (controlled diabetic range): 6.6 % (ref 0.0–7.0)

## 2023-09-02 MED ORDER — TIRZEPATIDE 7.5 MG/0.5ML ~~LOC~~ SOAJ: 7.5000 mg | SUBCUTANEOUS | 6 refills | Status: DC

## 2023-09-02 MED ORDER — INSULIN GLARGINE 100 UNIT/ML SOLOSTAR PEN
30.0000 [IU] | PEN_INJECTOR | Freq: Every day | SUBCUTANEOUS | 6 refills | Status: DC
Start: 2023-09-02 — End: 2023-12-02

## 2023-09-02 MED ORDER — HYDROCHLOROTHIAZIDE 25 MG PO TABS
25.0000 mg | ORAL_TABLET | Freq: Every day | ORAL | 1 refills | Status: DC
Start: 2023-09-02 — End: 2023-12-02

## 2023-09-02 MED ORDER — ATORVASTATIN CALCIUM 20 MG PO TABS
ORAL_TABLET | ORAL | 1 refills | Status: DC
Start: 2023-09-02 — End: 2023-12-02

## 2023-09-02 NOTE — Progress Notes (Signed)
Subjective:  Patient ID: Nicole [name], female    DOB: 01-Nov-1975  Age: 48 y.o. MRN: [id number]  CC: Medical Management of Chronic Issues   HPI REVONDA MENTER is a 48 y.o. year old female with a history of morbid obesity, Type 2 DM (A1c 6.6), diabetic neuropathy osteoarthritis of the knee, tobacco abuse.   Interval History: Discussed the use of AI scribe software for clinical note transcription with the patient, who gave verbal consent to proceed.  She presents with a chief complaint of vaginal discharge and odor. The discharge is not significant, but the odor is noticeable, particularly during urination. The patient has stopped taking her prescribed medications for hypertension (hydrochlorothiazide, valsartan) and hyperlipidemia due to concerns about side effects and a preference for holistic treatments. Despite this, the patient's blood pressure is elevated at the current visit. The patient also reports a recent stressful event involving her dog having a seizure, which may be contributing to her elevated blood pressure.  The patient's diabetes control has improved, with a recent A1c of 6.6, down from 7.8. This improvement coincides with a weight loss of 13 pounds. The patient is currently taking Mounjaro for her diabetes and reports tolerating it well at a dose of 7.5mg . The patient also reports neuropathy in her feet, which has improved recently. The patient has not been adhering to recommended health maintenance, including eye exams, mammograms, and colonoscopies, but agrees to referrals for these during the current visit. The patient declines a recommended pneumonia vaccine.        Past Medical History:  Diagnosis Date   Arthritis    knees   Diabetes mellitus without complication (HCC)    Hyperlipidemia    Hypertension    Ovarian cyst    Pregnancy induced hypertension    Type 2 diabetes mellitus (HCC) 01/08/2018    Past Surgical History:  Procedure Laterality Date    APPENDECTOMY     CESAREAN SECTION     HERNIA REPAIR     LAPAROSCOPIC APPENDECTOMY N/A 03/25/2014   Procedure: APPENDECTOMY LAPAROSCOPIC;  Surgeon: Cherylynn Ridges, MD;  Location: [facility];  Service: General;  Laterality: N/A;    Family History  Problem Relation Age of Onset   Hypertension Mother    Cancer Mother        pancreatic cancer   Arthritis Father    Heart disease Maternal Grandmother     Social History   Socioeconomic History   Marital status: Single    Spouse name: Not on file   Number of children: 2   Years of education: Not on file   Highest education level: Not on file  Occupational History   Not on file  Tobacco Use   Smoking status: Former    Current packs/day: 0.00    Average packs/day: 0.5 packs/day for 27.0 years (13.5 ttl pk-yrs)    Types: Cigarettes    Start date: 10/28/1993    Quit date: 10/28/2020    Years since quitting: 2.8   Smokeless tobacco: Never   Tobacco comments:    6 cigs daily  Vaping Use   Vaping status: Never Used  Substance and Sexual Activity   Alcohol use: Not Currently    Comment: occ   Drug use: Yes    Types: Marijuana    Comment: periodically   Sexual activity: Not Currently    Comment: 5 years  Other Topics Concern   Not on file  Social History Narrative   Not on file  Social Drivers of Corporate investment banker Strain: Low Risk  (11/12/2022)   Overall Financial Resource Strain (CARDIA)    Difficulty of Paying Living Expenses: Not very hard  Food Insecurity: No Food Insecurity (11/12/2022)   Hunger Vital Sign    Worried About Running Out of Food in the Last Year: Never true    Ran Out of Food in the Last Year: Never true  Transportation Needs: No Transportation Needs (05/24/2020)   PRAPARE - Administrator, Civil Service (Medical): No    Lack of Transportation (Non-Medical): No  Physical Activity: Inactive (11/12/2022)   Exercise Vital Sign    Days of Exercise per Week: 0 days    Minutes of Exercise per  Session: 0 min  Stress: No Stress Concern Present (11/12/2022)   [employer] of Occupational Health - Occupational Stress Questionnaire    Feeling of Stress : Not at all  Social Connections: Moderately Integrated (11/12/2022)   Social Connection and Isolation Panel [NHANES]    Frequency of Communication with Friends and Family: More than three times a week    Frequency of Social Gatherings with Friends and Family: More than three times a week    Attends Religious Services: 1 to 4 times per year    Active Member of [employer] or Organizations: No    Attends Banker Meetings: Never    Marital Status: Living with partner    No Known Allergies  Outpatient Medications Prior to Visit  Medication Sig Dispense Refill   albuterol (VENTOLIN HFA) 108 (90 Base) MCG/ACT inhaler Inhale 2 puffs into the lungs every 6 (six) hours as needed for wheezing or shortness of breath. 18 g 0   tirzepatide (MOUNJARO) 7.5 MG/0.5ML Pen Inject 7.5 mg into the skin once a week. 2 mL 1   insulin glargine (LANTUS) 100 UNIT/ML Solostar Pen Inject 30 Units into the skin daily. 15 mL 6   Accu-Chek Softclix Lancets lancets Use as instructed tid before meals (Patient not taking: Reported on 07/04/2023) 100 each 12   benzonatate (TESSALON) 100 MG capsule Take 1 capsule (100 mg total) by mouth 3 (three) times daily as needed for cough. (Patient not taking: Reported on 09/02/2023) 21 capsule 0   blood glucose meter kit and supplies KIT Dispense based on patient and insurance preference. Use up to four times daily as directed. (Patient not taking: Reported on 09/02/2023) 1 each 0   budesonide-formoterol (SYMBICORT) 160-4.5 MCG/ACT inhaler Inhale 2 puffs into the lungs daily. (Patient not taking: Reported on 09/02/2023) 1 each 0   conjugated estrogens (PREMARIN) vaginal cream Place 0.5 grams vaginally 2 (two) times a week. For 4 weeks (Patient not taking: Reported on 09/02/2023) 42.5 g 0   fluconazole (DIFLUCAN) 150 MG tablet  Take 150 mg by mouth once. (Patient not taking: Reported on 09/02/2023)     gabapentin (NEURONTIN) 300 MG capsule Take 1 capsule (300 mg total) by mouth at bedtime. (Patient not taking: Reported on 09/02/2023) 90 capsule 1   glucose blood (ACCU-CHEK GUIDE) test strip Use as instructed (Patient not taking: Reported on 09/02/2023) 100 each 12   glucose blood (FREESTYLE LITE) test strip Use as instructed (Patient not taking: Reported on 09/02/2023) 100 each 12   Insulin Pen Needle (PEN NEEDLES) 32G X 4 MM MISC Use to inject insulin once daily. (Patient not taking: Reported on 09/02/2023) 100 each 6   lidocaine (LIDODERM) 5 % Place 1 patch onto the skin daily. Remove & Discard patch within  12 hours or as directed by MD (Patient not taking: Reported on 09/02/2023) 30 patch 1   meloxicam (MOBIC) 7.5 MG tablet Take 1 tablet (7.5 mg total) by mouth daily. (Patient not taking: Reported on 09/02/2023) 30 tablet 1   tiotropium (SPIRIVA HANDIHALER) 18 MCG inhalation capsule Place 1 capsule (18 mcg total) into inhaler and inhale daily. (Patient not taking: Reported on 07/04/2023) 30 capsule 6   topiramate (TOPAMAX) 25 MG tablet Take 1 tablet (25 mg total) by mouth 2 (two) times daily. (Patient not taking: Reported on 09/02/2023) 60 tablet 3   triamcinolone cream (KENALOG) 0.1 % Apply 1 Application topically 2 (two) times daily. (Patient not taking: Reported on 09/02/2023) 45 g 1   valsartan (DIOVAN) 80 MG tablet Take 1 tablet (80 mg total) by mouth daily. (Patient not taking: Reported on 09/02/2023) 90 tablet 1   atorvastatin (LIPITOR) 20 MG tablet TAKE 1 TABLET(20 MG) BY MOUTH DAILY (Patient not taking: Reported on 09/02/2023) 90 tablet 1   hydrochlorothiazide (HYDRODIURIL) 25 MG tablet Take 1 tablet (25 mg total) by mouth daily. (Patient not taking: Reported on 09/02/2023) 90 tablet 1   No facility-administered medications prior to visit.     ROS Review of Systems  Constitutional:  Negative for activity change and appetite change.   HENT:  Negative for sinus pressure and sore throat.   Respiratory:  Negative for chest tightness, shortness of breath and wheezing.   Cardiovascular:  Negative for chest pain and palpitations.  Gastrointestinal:  Negative for abdominal distention, abdominal pain and constipation.  Genitourinary: Negative.   Musculoskeletal: Negative.   Psychiatric/Behavioral:  Negative for behavioral problems and dysphoric mood.     Objective:  BP (!) 159/98   Pulse 68   Ht 5\' 8"  (1.727 m)   Wt (!) 322 lb (146.1 kg)   SpO2 100%   BMI 48.96 kg/m      09/02/2023   10:26 AM 09/02/2023   10:01 AM 07/30/2023    2:30 PM  BP/Weight  Systolic BP 159 156 174  Diastolic BP 98 87 75  Wt. (Lbs)  322   BMI  48.96 kg/m2      Wt Readings from Last 3 Encounters:  09/02/23 (!) 322 lb (146.1 kg)  07/04/23 (!) 334 lb (151.5 kg)  05/21/23 (!) 335 lb 9.6 oz (152.2 kg)     Physical Exam Constitutional:      Appearance: She is well-developed. She is obese.  Cardiovascular:     Rate and Rhythm: Normal rate.     Heart sounds: Normal heart sounds. No murmur heard. Pulmonary:     Effort: Pulmonary effort is normal.     Breath sounds: Normal breath sounds. No wheezing or rales.  Chest:     Chest wall: No tenderness.  Abdominal:     General: Bowel sounds are normal. There is no distension.     Palpations: Abdomen is soft. There is no mass.     Tenderness: There is no abdominal tenderness.  Musculoskeletal:        General: Normal range of motion.     Right lower leg: No edema.     Left lower leg: No edema.  Neurological:     Mental Status: She is alert and oriented to person, place, and time.  Psychiatric:        Mood and Affect: Mood normal.        Latest Ref Rng & Units 04/22/2023    1:51 PM 04/11/2023    6:52  PM 10/08/2022    1:45 PM  CMP  Glucose 70 - 99 mg/dL 562  130  865   BUN 6 - 20 mg/dL 11  13  16    Creatinine 0.44 - 1.00 mg/dL 7.84  6.96  2.95   Sodium 135 - 145 mmol/L 136  137  141    Potassium 3.5 - 5.1 mmol/L 3.5  4.2  5.1   Chloride 98 - 111 mmol/L 103  99  102   CO2 22 - 32 mmol/L 25  22  23    Calcium 8.9 - 10.3 mg/dL 8.7  9.7  9.6   Total Protein 6.5 - 8.1 g/dL 6.8  7.4    Total Bilirubin 0.3 - 1.2 mg/dL 0.6  0.4    Alkaline Phos 38 - 126 U/L 101  111    AST 15 - 41 U/L 34  43    ALT 0 - 44 U/L 41  39      Lipid Panel     Component Value Date/Time   CHOL 199 11/23/2021 1223   TRIG 161 (H) 11/23/2021 1223   HDL 53 11/23/2021 1223   CHOLHDL 3.8 11/23/2021 1223   CHOLHDL 5.1 02/13/2021 0932   VLDL 26 02/13/2021 0932   LDLCALC 118 (H) 11/23/2021 1223    CBC    Component Value Date/Time   WBC 8.0 04/22/2023 1148   RBC 5.04 04/22/2023 1148   HGB 13.2 04/22/2023 1148   HGB 12.6 02/28/2021 1629   HCT 42.0 04/22/2023 1148   HCT 38.4 02/28/2021 1629   PLT 256 04/22/2023 1148   PLT 265 02/28/2021 1629   MCV 83.3 04/22/2023 1148   MCV 82 02/28/2021 1629   MCH 26.2 04/22/2023 1148   MCHC 31.4 04/22/2023 1148   RDW 13.9 04/22/2023 1148   RDW 13.4 02/28/2021 1629   LYMPHSABS 1.9 04/22/2023 1148   LYMPHSABS 2.8 02/28/2021 1629   MONOABS 0.4 04/22/2023 1148   EOSABS 0.1 04/22/2023 1148   EOSABS 0.2 02/28/2021 1629   BASOSABS 0.0 04/22/2023 1148   BASOSABS 0.1 02/28/2021 1629    Lab Results  Component Value Date   HGBA1C 6.6 09/02/2023    Assessment & Plan:      Vaginal Odor Noted odor and discharge during urination. -Obtain vaginal swab for further analysis.  Hypertension Elevated blood pressure noted during visit. Patient has stopped taking prescribed antihypertensive medications (hydrochlorothiazide, valsartan) due to personal research and preference for holistic approaches. Discussed the risks of untreated hypertension including stroke and organ damage. -Strongly advised patient to resume antihypertensive medications.  Hyperlipidemia Patient has stopped taking prescribed cholesterol medication. Discussed the benefits of cholesterol  medication in reducing the risk of cardiovascular disease. -Strongly advised patient to resume cholesterol medication.  Type 2 Diabetes A1c improved to 6.6 from 7.8, indicating good glycemic control. Patient has lost weight and is tolerating current dose of Mounjaro. -Continue current dose of Mounjaro. -Continue weight loss efforts.  General Health Maintenance -Refer for eye exam. -Order mammogram and colonoscopy. -Check cholesterol levels with blood work today. -Advise regular exercise on days off work. -Follow-up in 3 months.          Meds ordered this encounter  Medications   atorvastatin (LIPITOR) 20 MG tablet    Sig: TAKE 1 TABLET(20 MG) BY MOUTH DAILY    Dispense:  90 tablet    Refill:  1   hydrochlorothiazide (HYDRODIURIL) 25 MG tablet    Sig: Take 1 tablet (25 mg total) by mouth daily.  Dispense:  90 tablet    Refill:  1   insulin glargine (LANTUS) 100 UNIT/ML Solostar Pen    Sig: Inject 30 Units into the skin daily.    Dispense:  15 mL    Refill:  6    Follow-up: Return in about 3 months (around 11/30/2023) for Chronic medical conditions.       Hoy Register, MD, FAAFP. [facility] [place], [place] [phone]   09/02/2023, 10:34 AM

## 2023-09-02 NOTE — Patient Instructions (Signed)
VISIT SUMMARY:  During today's visit, we discussed your concerns about vaginal discharge and odor, as well as your current management of hypertension, hyperlipidemia, and type 2 diabetes. We also reviewed your general health maintenance and agreed on several referrals and tests to ensure your overall well-being.  YOUR PLAN:  -VAGINAL DISCHARGE: You reported a noticeable odor and discharge during urination. We have taken a vaginal swab for further analysis to determine the cause and appropriate treatment.  -HYPERTENSION: Your blood pressure was elevated today, and you mentioned stopping your prescribed medications. Hypertension, or high blood pressure, can lead to serious health issues like stroke and organ damage. It is important to resume your antihypertensive medications as advised.  -HYPERLIPIDEMIA: You have stopped taking your cholesterol medication. Hyperlipidemia, or high cholesterol, increases the risk of cardiovascular disease. It is important to resume your cholesterol medication to manage this risk effectively.  -TYPE 2 DIABETES: Your A1c has improved to 6.6, showing good blood sugar control. You have also lost weight and are tolerating your current dose of Mounjaro well. Continue with your current dose and keep up the good work with your weight loss efforts.  -GENERAL HEALTH MAINTENANCE: We discussed the importance of regular health screenings. You will be referred for an eye exam, and we will order a mammogram and colonoscopy. We also checked your cholesterol levels with blood work today. Regular exercise is recommended on your days off work.  INSTRUCTIONS:  Please follow up in 3 months for a re-evaluation of your blood pressure, cholesterol levels, and overall health. Make sure to complete the referrals for your eye exam, mammogram, and colonoscopy as discussed.

## 2023-09-03 ENCOUNTER — Encounter: Payer: Self-pay | Admitting: Family Medicine

## 2023-09-03 LAB — CMP14+EGFR
ALT: 21 [IU]/L (ref 0–32)
AST: 23 [IU]/L (ref 0–40)
Albumin: 4.3 g/dL (ref 3.9–4.9)
Alkaline Phosphatase: 122 [IU]/L — ABNORMAL HIGH (ref 44–121)
BUN/Creatinine Ratio: 10 (ref 9–23)
BUN: 10 mg/dL (ref 6–24)
Bilirubin Total: 0.4 mg/dL (ref 0.0–1.2)
CO2: 23 mmol/L (ref 20–29)
Calcium: 9.7 mg/dL (ref 8.7–10.2)
Chloride: 104 mmol/L (ref 96–106)
Creatinine, Ser: 0.99 mg/dL (ref 0.57–1.00)
Globulin, Total: 2.7 g/dL (ref 1.5–4.5)
Glucose: 86 mg/dL (ref 70–99)
Potassium: 4.3 mmol/L (ref 3.5–5.2)
Sodium: 142 mmol/L (ref 134–144)
Total Protein: 7 g/dL (ref 6.0–8.5)
eGFR: 70 mL/min/{1.73_m2} (ref >59)

## 2023-09-03 LAB — LP+NON-HDL CHOLESTEROL
Cholesterol, Total: 189 mg/dL (ref 100–199)
HDL: 46 mg/dL (ref >39)
LDL Chol Calc (NIH): 129 mg/dL — ABNORMAL HIGH (ref 0–99)
Total Non-HDL-Chol (LDL+VLDL): 143 mg/dL — ABNORMAL HIGH (ref 0–129)
Triglycerides: 78 mg/dL (ref 0–149)
VLDL Cholesterol Cal: 14 mg/dL (ref 5–40)

## 2023-09-03 LAB — CERVICOVAGINAL ANCILLARY ONLY
Bacterial Vaginitis (gardnerella): NEGATIVE
Candida Glabrata: NEGATIVE
Candida Vaginitis: NEGATIVE
Chlamydia: NEGATIVE
Comment: NEGATIVE
Comment: NEGATIVE
Comment: NEGATIVE
Comment: NEGATIVE
Comment: NEGATIVE
Comment: NORMAL
Neisseria Gonorrhea: NEGATIVE
Trichomonas: NEGATIVE

## 2023-09-05 DIAGNOSIS — E119 Type 2 diabetes mellitus without complications: Secondary | ICD-10-CM | POA: Diagnosis not present

## 2023-09-05 DIAGNOSIS — H5310 Unspecified subjective visual disturbances: Secondary | ICD-10-CM | POA: Diagnosis not present

## 2023-09-05 LAB — HM DIABETES EYE EXAM

## 2023-09-09 ENCOUNTER — Ambulatory Visit: Payer: Medicaid Other | Admitting: Cardiovascular Disease

## 2023-09-11 ENCOUNTER — Other Ambulatory Visit: Payer: Self-pay

## 2023-10-07 ENCOUNTER — Ambulatory Visit
Admission: RE | Admit: 2023-10-07 | Discharge: 2023-10-07 | Disposition: A | Discharge status: Home / Self Care | Payer: Medicaid Other | Source: Ambulatory Visit | Attending: Family Medicine | Admitting: Family Medicine

## 2023-10-07 DIAGNOSIS — Z1231 Encounter for screening mammogram for malignant neoplasm of breast: Secondary | ICD-10-CM

## 2023-10-10 ENCOUNTER — Encounter: Payer: Self-pay | Admitting: Family Medicine

## 2023-10-11 ENCOUNTER — Other Ambulatory Visit: Payer: Self-pay | Admitting: Family Medicine

## 2023-10-11 DIAGNOSIS — R928 Other abnormal and inconclusive findings on diagnostic imaging of breast: Secondary | ICD-10-CM

## 2023-10-28 ENCOUNTER — Ambulatory Visit
Admission: RE | Admit: 2023-10-28 | Discharge: 2023-10-28 | Disposition: A | Discharge status: Home / Self Care | Source: Ambulatory Visit | Attending: Family Medicine | Admitting: Family Medicine

## 2023-10-28 ENCOUNTER — Ambulatory Visit: Admission: RE | Admit: 2023-10-28 | Source: Ambulatory Visit

## 2023-10-28 DIAGNOSIS — R928 Other abnormal and inconclusive findings on diagnostic imaging of breast: Secondary | ICD-10-CM | POA: Diagnosis not present

## 2023-10-29 ENCOUNTER — Ambulatory Visit (HOSPITAL_COMMUNITY)
Admission: EM | Admit: 2023-10-29 | Discharge: 2023-10-29 | Disposition: A | Discharge status: Home / Self Care | Attending: Family Medicine | Admitting: Family Medicine

## 2023-10-29 ENCOUNTER — Encounter (HOSPITAL_COMMUNITY): Payer: Self-pay

## 2023-10-29 DIAGNOSIS — M545 Low back pain, unspecified: Secondary | ICD-10-CM

## 2023-10-29 LAB — POCT URINALYSIS DIP (MANUAL ENTRY)
Bilirubin, UA: NEGATIVE
Blood, UA: NEGATIVE
Glucose, UA: NEGATIVE mg/dL
Ketones, POC UA: NEGATIVE mg/dL
Leukocytes, UA: NEGATIVE
Nitrite, UA: NEGATIVE
Spec Grav, UA: >=1.03 — AB (ref 1.010–1.025)
Urobilinogen, UA: 0.2 U/dL
pH, UA: 5.5 (ref 5.0–8.0)

## 2023-10-29 LAB — POCT URINE PREGNANCY: Preg Test, Ur: NEGATIVE

## 2023-10-29 MED ORDER — METHYLPREDNISOLONE ACETATE 80 MG/ML IJ SUSP
80.0000 mg | Freq: Once | INTRAMUSCULAR | Status: AC
  Administered 2023-10-29: 80 mg via INTRAMUSCULAR

## 2023-10-29 MED ORDER — METHYLPREDNISOLONE ACETATE 80 MG/ML IJ SUSP
INTRAMUSCULAR | Status: AC
  Filled 2023-10-29: qty 1

## 2023-10-29 NOTE — ED Triage Notes (Signed)
 Patient reports that she has right lower back pain with no radiation of pain to the lower extremities. Pain worsens with ambulation. Patient denies injury or heavy lifting.  Patient states she has not taken any medications for her symptoms.

## 2023-10-29 NOTE — Discharge Instructions (Addendum)
 Be aware, your blood sugars will run higher than normal for a little while secondary to the shot you received here this evening. Monitor closely.  HOME CARE INSTRUCTIONS: For many people, back pain returns. Since low back pain is rarely dangerous, it is often a condition that people can learn to manage on their own. Please remain active. It is stressful on the back to sit or stand in one place. Do not sit, drive, or stand in one place for more than 30 minutes at a time. Take short walks on level surfaces as soon as pain allows. Try to increase the length of time you walk each day. Do not stay in bed. Resting more than 1 or 2 days can delay your recovery. Do not avoid exercise or work. Your body is made to move. It is not dangerous to be active, even though your back may hurt. Your back will likely heal faster if you return to being active before your pain is gone. Over-the-counter medicines to reduce pain and inflammation are often the most helpful.  SEEK MEDICAL CARE IF: You have pain that is not relieved with rest or medicine. You have pain that does not improve in 1 week. You have new symptoms. You are generally not feeling well.  SEEK IMMEDIATE MEDICAL CARE IF: You have pain that radiates from your back into your legs. You develop new bowel or bladder control problems. You have unusual weakness or numbness in your arms or legs. You develop nausea or vomiting. You develop abdominal pain. You feel faint.

## 2023-10-30 NOTE — ED Provider Notes (Signed)
 [facility]   [id number] 10/29/23 Arrival Time: 1658  ASSESSMENT & PLAN:  1. Acute right-sided low back pain without sciatica     Able to ambulate here and hemodynamically stable. No indication for imaging of back at this time given no trauma and normal neurological exam. Discussed.  Meds ordered this encounter  Medications   methylPREDNISolone acetate (DEPO-MEDROL) injection 80 mg   Work/school excuse note: not needed. Medication sedation precautions given. Encourage ROM/movement as tolerated.  Recommend:  Follow-up Information     Hoy Register, MD.   Specialty: Family Medicine Why: If worsening or failing to improve as anticipated. Contact information: [address] [place] [address] [place] [address] [phone]                 Reviewed expectations re: course of current medical issues. Questions answered. Outlined signs and symptoms indicating need for more acute intervention. Patient verbalized understanding. After Visit Summary given.   SUBJECTIVE: History from: patient.  Nicole [name] is a 47 y.o. female who presents with complaint of fairly persistent right sided lower back discomfort. Onset gradual. First noted  yesterday . Injury/trama: denies. History of back problems requiring medical care: occasional. Pain described as aching and without radiation. Aggravating factors: certain movements. Alleviating factors: have not been identified. Progressive LE weakness or saddle anesthesia: denies. Extremity sensation changes or weakness: denies. Ambulatory without difficulty. Normal bowel/bladder habits: yes; without urinary retention. Normal PO intake without n/v. No associated abdominal pain/n/v. Tylenol with some help.    OBJECTIVE:  Vitals:   10/29/23 1719  BP: 138/86  Pulse: 74  Resp: 16  Temp: 97.7 F (36.5 C)  TempSrc: Oral  SpO2: 97%    General appearance: alert; no distress HEENT: ; AT Neck: supple with FROM; without  midline tenderness CV: regular Lungs: unlabored respirations; speaks full sentences without difficulty Abdomen: soft, non-tender; non-distended Back: moderate and poorly localized tenderness to palpation over R lumbar musculature ; FROM at waist; bruising: none; without midline tenderness Extremities: without edema; symmetrical without gross deformities; normal ROM of bilateral LE Skin: warm and dry Neurologic: normal gait; normal sensation and strength of bilateral LE Psychological: alert and cooperative; normal mood and affect  Labs: Results for orders placed or performed during the hospital encounter of 10/29/23  POC urinalysis dipstick   Collection Time: 10/29/23  6:09 PM  Result Value Ref Range   Color, UA orange (A) yellow   Clarity, UA clear clear   Glucose, UA negative negative mg/dL   Bilirubin, UA negative negative   Ketones, POC UA negative negative mg/dL   Spec Grav, UA >=4.696 (A) 1.010 - 1.025   Blood, UA negative negative   pH, UA 5.5 5.0 - 8.0   Protein Ur, POC trace (A) negative mg/dL   Urobilinogen, UA 0.2 0.2 or 1.0 E.U./dL   Nitrite, UA Negative Negative   Leukocytes, UA Negative Negative  POCT urine pregnancy   Collection Time: 10/29/23  6:10 PM  Result Value Ref Range   Preg Test, Ur Negative Negative   Labs Reviewed  POCT URINALYSIS DIP (MANUAL ENTRY) - Abnormal; Notable for the following components:      Result Value   Color, UA orange (*)    Spec Grav, UA >=1.030 (*)    Protein Ur, POC trace (*)    All other components within normal limits  POCT URINE PREGNANCY     No Known Allergies  Past Medical History:  Diagnosis Date   Arthritis  knees   Diabetes mellitus without complication (HCC)    Hyperlipidemia    Hypertension    Ovarian cyst    Pregnancy induced hypertension    Type 2 diabetes mellitus (HCC) 01/08/2018   Social History   Socioeconomic History   Marital status: Single    Spouse name: Not on file   Number of children: 2    Years of education: Not on file   Highest education level: Not on file  Occupational History   Not on file  Tobacco Use   Smoking status: Former    Current packs/day: 0.00    Average packs/day: 0.5 packs/day for 27.0 years (13.5 ttl pk-yrs)    Types: Cigarettes    Start date: 10/28/1993    Quit date: 10/28/2020    Years since quitting: 3.0   Smokeless tobacco: Never   Tobacco comments:    6 cigs daily  Vaping Use   Vaping status: Never Used  Substance and Sexual Activity   Alcohol use: Not Currently    Comment: occ   Drug use: Not Currently    Types: Marijuana    Comment: periodically   Sexual activity: Not Currently    Comment: 5 years  Other Topics Concern   Not on file  Social History Narrative   Not on file   Social Drivers of Health   Financial Resource Strain: Low Risk  (11/12/2022)   Overall Financial Resource Strain (CARDIA)    Difficulty of Paying Living Expenses: Not very hard  Food Insecurity: No Food Insecurity (11/12/2022)   Hunger Vital Sign    Worried About Running Out of Food in the Last Year: Never true    Ran Out of Food in the Last Year: Never true  Transportation Needs: No Transportation Needs (05/24/2020)   PRAPARE - Administrator, Civil Service (Medical): No    Lack of Transportation (Non-Medical): No  Physical Activity: Inactive (11/12/2022)   Exercise Vital Sign    Days of Exercise per Week: 0 days    Minutes of Exercise per Session: 0 min  Stress: No Stress Concern Present (11/12/2022)   [employer] of Occupational Health - Occupational Stress Questionnaire    Feeling of Stress : Not at all  Social Connections: Moderately Integrated (11/12/2022)   Social Connection and Isolation Panel [NHANES]    Frequency of Communication with Friends and Family: More than three times a week    Frequency of Social Gatherings with Friends and Family: More than three times a week    Attends Religious Services: 1 to 4 times per year    Active  Member of [employer] or Organizations: No    Attends Banker Meetings: Never    Marital Status: Living with partner  Intimate Partner Violence: Not At Risk (11/12/2022)   Humiliation, Afraid, Rape, and Kick questionnaire    Fear of Current or Ex-Partner: No    Emotionally Abused: No    Physically Abused: No    Sexually Abused: No   Family History  Problem Relation Age of Onset   Hypertension Mother    Cancer Mother        pancreatic cancer   Arthritis Father    Heart disease Maternal Grandmother    Past Surgical History:  Procedure Laterality Date   APPENDECTOMY     CESAREAN SECTION     HERNIA REPAIR     LAPAROSCOPIC APPENDECTOMY N/A 03/25/2014   Procedure: APPENDECTOMY LAPAROSCOPIC;  Surgeon: Cherylynn Ridges, MD;  Location: [facility]  OR;  Service: General;  Laterality: Vertis Kelch, MD 10/30/23 1006

## 2023-11-22 ENCOUNTER — Ambulatory Visit (HOSPITAL_COMMUNITY)
Admission: EM | Admit: 2023-11-22 | Discharge: 2023-11-22 | Disposition: A | Discharge status: Home / Self Care | Attending: Emergency Medicine | Admitting: Emergency Medicine

## 2023-11-22 ENCOUNTER — Ambulatory Visit (INDEPENDENT_AMBULATORY_CARE_PROVIDER_SITE_OTHER)

## 2023-11-22 ENCOUNTER — Encounter (HOSPITAL_COMMUNITY): Payer: Self-pay | Admitting: *Deleted

## 2023-11-22 DIAGNOSIS — M778 Other enthesopathies, not elsewhere classified: Secondary | ICD-10-CM | POA: Diagnosis not present

## 2023-11-22 DIAGNOSIS — M7989 Other specified soft tissue disorders: Secondary | ICD-10-CM | POA: Diagnosis not present

## 2023-11-22 DIAGNOSIS — M25531 Pain in right wrist: Secondary | ICD-10-CM | POA: Diagnosis not present

## 2023-11-22 DIAGNOSIS — M67833 Other specified disorders of tendon, right wrist: Secondary | ICD-10-CM

## 2023-11-22 MED ORDER — DEXAMETHASONE SODIUM PHOSPHATE 10 MG/ML IJ SOLN
10.0000 mg | Freq: Once | INTRAMUSCULAR | Status: AC
  Administered 2023-11-22: 10 mg via INTRAMUSCULAR

## 2023-11-22 MED ORDER — NAPROXEN 500 MG PO TABS: 500.0000 mg | ORAL_TABLET | Freq: Two times a day (BID) | ORAL | 0 refills | Status: DC

## 2023-11-22 MED ORDER — DEXAMETHASONE SODIUM PHOSPHATE 10 MG/ML IJ SOLN
INTRAMUSCULAR | Status: AC
  Filled 2023-11-22: qty 1

## 2023-11-22 NOTE — Discharge Instructions (Addendum)
 Wear wrist brace to help with swelling and as needed for comfort. Take naproxen  twice daily as needed for pain.  Do not take this with other NSAIDs including ibuprofen , Advil , Aleve , Motrin , Goody powder, and meloxicam . You can take 650 mg of Tylenol  every 6 hours as needed for breakthrough pain. Otherwise alternate between ice and heat to help with swelling and pain. Rest and elevate as often as possible to assist with healing. Follow-up with  sports medicine if your pain persists. Return here as needed.

## 2023-11-22 NOTE — ED Triage Notes (Signed)
 Pt states her right wrist hurts so bad she can't even move her fingers. She denies any injury but states it started today. She hasn't taken anything for the pain,

## 2023-11-22 NOTE — ED Provider Notes (Signed)
 [facility]    CSN: [id number] Arrival date & time: 11/22/23  1444      History   Chief Complaint Chief Complaint  Patient presents with   Wrist Pain    HPI Nicole [name] is a 48 y.o. female.   Patient presents with right wrist pain that began this morning.  Patient states that the pain radiates up her arm and down her hand.  Patient states that the pain is so severe that she has difficulty moving her fingers and wrist.  Patient denies any injury or recent falls.  Patient denies taking anything for pain.   Wrist Pain    Past Medical History:  Diagnosis Date   Arthritis    knees   Diabetes mellitus without complication (HCC)    Hyperlipidemia    Hypertension    Ovarian cyst    Pregnancy induced hypertension    Type 2 diabetes mellitus (HCC) 01/08/2018    Patient Active Problem List   Diagnosis Date Noted   History of diverticulitis 02/28/2021   Antibiotic-induced yeast infection 02/28/2021   Thrombocytopenia (HCC) 02/28/2021   Hypokalemia 02/28/2021   Sigmoid diverticulitis 02/11/2021   Bacterial vaginitis 12/19/2020   Anxiety and depression 06/11/2018   Vitamin D  deficiency 01/08/2018   Osteoarthritis 01/08/2018   Type 2 diabetes mellitus (HCC) 01/08/2018   Acute appendicitis 03/25/2014   Hypertension 03/25/2014   Morbid obesity (HCC) 03/24/2014   Benign essential HTN 03/24/2014   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   Chronic back pain 03/24/2014   Primary osteoarthritis of both knees 03/24/2014   Tobacco use 03/24/2014    Past Surgical History:  Procedure Laterality Date   APPENDECTOMY     CESAREAN SECTION     HERNIA REPAIR     LAPAROSCOPIC APPENDECTOMY N/A 03/25/2014   Procedure: APPENDECTOMY LAPAROSCOPIC;  Surgeon: Diantha Fossa, MD;  Location: [facility] OR;  Service: General;  Laterality: N/A;    OB History     Gravida  2   Para  2   Term  1   Preterm  1   AB  0   Living  2      SAB      IAB      Ectopic      Multiple       Live Births  2            Home Medications    Prior to Admission medications   Medication Sig Start Date End Date Taking? Authorizing Provider  atorvastatin  (LIPITOR ) 20 MG tablet TAKE 1 TABLET(20 MG) BY MOUTH DAILY 09/02/23  Yes Newlin, Enobong, MD  hydrochlorothiazide  (HYDRODIURIL ) 25 MG tablet Take 1 tablet (25 mg total) by mouth daily. 09/02/23  Yes Newlin, Enobong, MD  insulin  glargine (LANTUS ) 100 UNIT/ML Solostar Pen Inject 30 Units into the skin daily. 09/02/23  Yes Newlin, Enobong, MD  naproxen  (NAPROSYN ) 500 MG tablet Take 1 tablet (500 mg total) by mouth 2 (two) times daily. 11/22/23  Yes Rosevelt Constable, Kharee Lesesne A, NP  tirzepatide  (MOUNJARO ) 7.5 MG/0.5ML Pen Inject 7.5 mg into the skin once a week. 09/02/23  Yes Newlin, Enobong, MD  Accu-Chek Softclix Lancets lancets Use as instructed tid before meals Patient not taking: Reported on 07/04/2023 04/03/22   Newlin, Enobong, MD  albuterol  (VENTOLIN  HFA) 108 (90 Base) MCG/ACT inhaler Inhale 2 puffs into the lungs every 6 (six) hours as needed for wheezing or shortness of breath. 06/21/23   Ann Keto, MD  benzonatate  (TESSALON ) 100 MG capsule  Take 1 capsule (100 mg total) by mouth 3 (three) times daily as needed for cough. Patient not taking: Reported on 09/02/2023 07/30/23   Ann Keto, MD  blood glucose meter kit and supplies KIT Dispense based on patient and insurance preference. Use up to four times daily as directed. Patient not taking: Reported on 09/02/2023 02/15/21   Haydee Lipa, MD  budesonide -formoterol  (SYMBICORT ) 160-4.5 MCG/ACT inhaler Inhale 2 puffs into the lungs daily. Patient not taking: Reported on 07/04/2023 08/28/21   Corine Dice, MD  conjugated estrogens  (PREMARIN ) vaginal cream Place 0.5 grams vaginally 2 (two) times a week. For 4 weeks Patient not taking: Reported on 07/04/2023 02/11/23   Newlin, Enobong, MD  fluconazole  (DIFLUCAN ) 150 MG tablet Take 150 mg by mouth once. Patient not taking: Reported on  07/04/2023 10/25/22   [provider]  gabapentin  (NEURONTIN ) 300 MG capsule Take 1 capsule (300 mg total) by mouth at bedtime. Patient not taking: Reported on 09/02/2023 05/21/23   Newlin, Enobong, MD  glucose blood (ACCU-CHEK GUIDE) test strip Use as instructed Patient not taking: Reported on 09/02/2023 11/23/21   Lawrance Presume, MD  glucose blood (FREESTYLE LITE) test strip Use as instructed Patient not taking: Reported on 09/02/2023 09/06/21   Owen Blowers P, DO  Insulin  Pen Needle (PEN NEEDLES) 32G X 4 MM MISC Use to inject insulin  once daily. Patient not taking: Reported on 09/02/2023 01/09/23   Newlin, Enobong, MD  lidocaine  (LIDODERM ) 5 % Place 1 patch onto the skin daily. Remove & Discard patch within 12 hours or as directed by MD Patient not taking: Reported on 07/04/2023 04/03/22   Newlin, Enobong, MD  meloxicam  (MOBIC ) 7.5 MG tablet Take 1 tablet (7.5 mg total) by mouth daily. Patient not taking: Reported on 07/04/2023 07/16/22   Newlin, Enobong, MD  tiotropium (SPIRIVA  HANDIHALER) 18 MCG inhalation capsule Place 1 capsule (18 mcg total) into inhaler and inhale daily. Patient not taking: Reported on 07/04/2023 06/15/19   Newlin, Enobong, MD  topiramate  (TOPAMAX ) 25 MG tablet Take 1 tablet (25 mg total) by mouth 2 (two) times daily. Patient not taking: Reported on 07/04/2023 04/03/22   Newlin, Enobong, MD  triamcinolone  cream (KENALOG ) 0.1 % Apply 1 Application topically 2 (two) times daily. Patient not taking: Reported on 07/04/2023 01/08/23   Newlin, Enobong, MD  valsartan  (DIOVAN ) 80 MG tablet Take 1 tablet (80 mg total) by mouth daily. Patient not taking: Reported on 07/04/2023 05/21/23   Newlin, Enobong, MD  metoprolol  tartrate (LOPRESSOR ) 100 MG tablet Take 1 tablet by mouth once for procedure. 04/05/21   O'Neal, Cathay Clonts, MD    Family History Family History  Problem Relation Age of Onset   Hypertension Mother    Cancer Mother        pancreatic cancer   Arthritis Father     Heart disease Maternal Grandmother     Social History Social History   Tobacco Use   Smoking status: Former    Current packs/day: 0.00    Average packs/day: 0.5 packs/day for 27.0 years (13.5 ttl pk-yrs)    Types: Cigarettes    Start date: 10/28/1993    Quit date: 10/28/2020    Years since quitting: 3.0   Smokeless tobacco: Never   Tobacco comments:    6 cigs daily  Vaping Use   Vaping status: Never Used  Substance Use Topics   Alcohol use: Not Currently    Comment: occ   Drug use: Yes    Types: Marijuana  Comment: periodically     Allergies   Patient has no known allergies.   Review of Systems Review of Systems  Per HPI  Physical Exam Triage Vital Signs ED Triage Vitals  Encounter Vitals Group     BP 11/22/23 1542 132/81     Systolic BP Percentile --      Diastolic BP Percentile --      Pulse Rate 11/22/23 1542 68     Resp 11/22/23 1542 18     Temp 11/22/23 1542 97.9 F (36.6 C)     Temp Source 11/22/23 1542 Oral     SpO2 11/22/23 1542 100 %     Weight --      Height --      Head Circumference --      Peak Flow --      Pain Score 11/22/23 1540 10     Pain Loc --      Pain Education --      Exclude from Growth Chart --    No data found.  Updated Vital Signs BP 132/81 (BP Location: Left Arm)   Pulse 68   Temp 97.9 F (36.6 C) (Oral)   Resp 18   LMP  (LMP Unknown)   SpO2 100%   Visual Acuity Right Eye Distance:   Left Eye Distance:   Bilateral Distance:    Right Eye Near:   Left Eye Near:    Bilateral Near:     Physical Exam Vitals and nursing note reviewed.  Constitutional:      General: She is awake. She is not in acute distress.    Appearance: Normal appearance. She is well-developed and well-groomed. She is not ill-appearing.  Musculoskeletal:     Right shoulder: Normal.     Right upper arm: Normal.     Right elbow: Normal.     Right forearm: Normal.     Right wrist: Swelling and tenderness present. No deformity. Decreased range  of motion.     Right hand: Swelling present. No tenderness. Normal range of motion.  Skin:    General: Skin is warm and dry.  Neurological:     Mental Status: She is alert.  Psychiatric:        Behavior: Behavior is cooperative.      UC Treatments / Results  Labs (all labs ordered are listed, but only abnormal results are displayed) Labs Reviewed - No data to display  EKG   Radiology DG Wrist Complete Right Result Date: 11/22/2023 CLINICAL DATA:  Wrist pain. EXAM: RIGHT WRIST - COMPLETE 3+ VIEW COMPARISON:  None Available. FINDINGS: There is no evidence of fracture or dislocation. There is no evidence of arthropathy or other focal bone abnormality. There is soft tissue swelling surrounding the wrist. IMPRESSION: 1. No acute fracture or dislocation. 2. Soft tissue swelling surrounding the wrist. Electronically Signed   By: Tyron Gallon M.D.   On: 11/22/2023 16:46    Procedures Procedures (including critical care time)  Medications Ordered in UC Medications  dexamethasone  (DECADRON ) injection 10 mg (10 mg Intramuscular Given 11/22/23 1712)    Initial Impression / Assessment and Plan / UC Course  I have reviewed the triage vital signs and the nursing notes.  Pertinent labs & imaging results that were available during my care of the patient were reviewed by me and considered in my medical decision making (see chart for details).     Patient is well-appearing.  Vitals are stable.  Mild soft tissue swelling noted to  right breast and right hand.  Tenderness noted to right wrist with decreased range of motion due to pain.  X-ray ordered.  Based on my interpretation of x-ray there is no underlying fracture or dislocation.  Radiology report confirms this and reveals soft tissue swelling surrounding the wrist.  Pain likely related to tendinitis.  Given IM Decadron  in clinic.  Prescribed naproxen .  Given wrist brace.  Given orthopedic follow-up.  Discussed return precautions. Final  Clinical Impressions(s) / UC Diagnoses   Final diagnoses:  Acute pain of right wrist  Tendonitis of wrist, right     Discharge Instructions      Wear wrist brace to help with swelling and as needed for comfort. Take naproxen  twice daily as needed for pain.  Do not take this with other NSAIDs including ibuprofen , Advil , Aleve , Motrin , Goody powder, and meloxicam . You can take 650 mg of Tylenol  every 6 hours as needed for breakthrough pain. Otherwise alternate between ice and heat to help with swelling and pain. Rest and elevate as often as possible to assist with healing. Follow-up with [facility] if your pain persists. Return here as needed.    ED Prescriptions     Medication Sig Dispense Auth. Provider   naproxen  (NAPROSYN ) 500 MG tablet Take 1 tablet (500 mg total) by mouth 2 (two) times daily. 30 tablet Levora Reas A, NP      PDMP not reviewed this encounter.   Levora Reas A, NP 11/22/23 [phone]

## 2023-12-02 ENCOUNTER — Encounter: Payer: Self-pay | Admitting: Family Medicine

## 2023-12-02 ENCOUNTER — Ambulatory Visit: Payer: Medicaid Other | Attending: Family Medicine | Admitting: Family Medicine

## 2023-12-02 VITALS — BP 137/83 | HR 67 | Ht 68.0 in | Wt 312.4 lb

## 2023-12-02 DIAGNOSIS — E1159 Type 2 diabetes mellitus with other circulatory complications: Secondary | ICD-10-CM | POA: Diagnosis not present

## 2023-12-02 DIAGNOSIS — E785 Hyperlipidemia, unspecified: Secondary | ICD-10-CM

## 2023-12-02 DIAGNOSIS — E1169 Type 2 diabetes mellitus with other specified complication: Secondary | ICD-10-CM

## 2023-12-02 DIAGNOSIS — Z7985 Long-term (current) use of injectable non-insulin antidiabetic drugs: Secondary | ICD-10-CM

## 2023-12-02 DIAGNOSIS — K5900 Constipation, unspecified: Secondary | ICD-10-CM

## 2023-12-02 DIAGNOSIS — E1142 Type 2 diabetes mellitus with diabetic polyneuropathy: Secondary | ICD-10-CM | POA: Diagnosis not present

## 2023-12-02 DIAGNOSIS — M778 Other enthesopathies, not elsewhere classified: Secondary | ICD-10-CM | POA: Diagnosis not present

## 2023-12-02 DIAGNOSIS — I152 Hypertension secondary to endocrine disorders: Secondary | ICD-10-CM

## 2023-12-02 DIAGNOSIS — Z794 Long term (current) use of insulin: Secondary | ICD-10-CM | POA: Diagnosis not present

## 2023-12-02 LAB — POCT GLYCOSYLATED HEMOGLOBIN (HGB A1C): HbA1c, POC (controlled diabetic range): 5.7 % (ref 0.0–7.0)

## 2023-12-02 MED ORDER — ATORVASTATIN CALCIUM 20 MG PO TABS: ORAL_TABLET | ORAL | 1 refills | Status: DC

## 2023-12-02 MED ORDER — INSULIN GLARGINE 100 UNIT/ML SOLOSTAR PEN: 30.0000 [IU] | PEN_INJECTOR | Freq: Every day | SUBCUTANEOUS | 6 refills | Status: DC

## 2023-12-02 MED ORDER — VALSARTAN 80 MG PO TABS: 80.0000 mg | ORAL_TABLET | Freq: Every day | ORAL | 1 refills | Status: AC

## 2023-12-02 MED ORDER — TIRZEPATIDE 7.5 MG/0.5ML ~~LOC~~ SOAJ
7.5000 mg | SUBCUTANEOUS | 6 refills | Status: DC
Start: 2023-12-02 — End: 2024-06-09

## 2023-12-02 NOTE — Progress Notes (Signed)
 Subjective:  Patient ID: Nicole [name], female    DOB: 1975/08/27  Age: 48 y.o. MRN: [id number]  CC: Medical Management of Chronic Issues     Discussed the use of AI scribe software for clinical note transcription with the patient, who gave verbal consent to proceed.  History of Present Illness Nicole [name] is a 48 year old female with morbid obesity, Type 2 DM (A1c 6.6), diabetic neuropathy osteoarthritis of the knee, tobacco abuse who presents for a follow-up visit.  She has lost 22 pounds since December, attributed to her diabetes treatment with Mounjaro . She administers 30 units of Lantus  every morning and takes 7.5 mg of Mounjaro , which she finds tolerable. Her A1c has improved to 5.7 from 6.6.  For hypertension, she takes valsartan  80 mg nightly but has stopped hydrochlorothiazide  25 mg due to frequent urination. She needs a refill for atorvastatin  20 mg for cholesterol management as she lost her current prescription.  She experienced right wrist swelling and pain, which was diagnosed as tendinitis at an ED visit and has improved but requires further evaluation.  X-ray revealed soft tissue swelling but no fracture or dislocation.  She was prescribed naproxen  in the emergency room but would like referral to orthopedic.  She experiences constipation, particularly with Mounjaro , with bowel movements every four days. No current issues with her feet and uses albuterol  as needed.    Past Medical History:  Diagnosis Date   Arthritis    knees   Diabetes mellitus without complication (HCC)    Hyperlipidemia    Hypertension    Ovarian cyst    Pregnancy induced hypertension    Type 2 diabetes mellitus (HCC) 01/08/2018    Past Surgical History:  Procedure Laterality Date   APPENDECTOMY     CESAREAN SECTION     HERNIA REPAIR     LAPAROSCOPIC APPENDECTOMY N/A 03/25/2014   Procedure: APPENDECTOMY LAPAROSCOPIC;  Surgeon: Diantha Fossa, MD;  Location: [facility] OR;  Service: General;   Laterality: N/A;    Family History  Problem Relation Age of Onset   Hypertension Mother    Cancer Mother        pancreatic cancer   Arthritis Father    Heart disease Maternal Grandmother     Social History   Socioeconomic History   Marital status: Single    Spouse name: Not on file   Number of children: 2   Years of education: Not on file   Highest education level: Not on file  Occupational History   Not on file  Tobacco Use   Smoking status: Former    Current packs/day: 0.00    Average packs/day: 0.5 packs/day for 27.0 years (13.5 ttl pk-yrs)    Types: Cigarettes    Start date: 10/28/1993    Quit date: 10/28/2020    Years since quitting: 3.0   Smokeless tobacco: Never   Tobacco comments:    6 cigs daily  Vaping Use   Vaping status: Never Used  Substance and Sexual Activity   Alcohol use: Not Currently    Comment: occ   Drug use: Yes    Types: Marijuana    Comment: periodically   Sexual activity: Not Currently    Comment: 5 years  Other Topics Concern   Not on file  Social History Narrative   Not on file   Social Drivers of Health   Financial Resource Strain: Low Risk  (11/12/2022)   Overall Financial Resource Strain (CARDIA)    Difficulty of  Paying Living Expenses: Not very hard  Food Insecurity: No Food Insecurity (11/12/2022)   Hunger Vital Sign    Worried About Running Out of Food in the Last Year: Never true    Ran Out of Food in the Last Year: Never true  Transportation Needs: No Transportation Needs (05/24/2020)   PRAPARE - Administrator, Civil Service (Medical): No    Lack of Transportation (Non-Medical): No  Physical Activity: Inactive (11/12/2022)   Exercise Vital Sign    Days of Exercise per Week: 0 days    Minutes of Exercise per Session: 0 min  Stress: No Stress Concern Present (11/12/2022)   [employer] of Occupational Health - Occupational Stress Questionnaire    Feeling of Stress : Not at all  Social Connections: Moderately  Integrated (11/12/2022)   Social Connection and Isolation Panel [NHANES]    Frequency of Communication with Friends and Family: More than three times a week    Frequency of Social Gatherings with Friends and Family: More than three times a week    Attends Religious Services: 1 to 4 times per year    Active Member of [employer] or Organizations: No    Attends Banker Meetings: Never    Marital Status: Living with partner    No Known Allergies  Outpatient Medications Prior to Visit  Medication Sig Dispense Refill   albuterol  (VENTOLIN  HFA) 108 (90 Base) MCG/ACT inhaler Inhale 2 puffs into the lungs every 6 (six) hours as needed for wheezing or shortness of breath. 18 g 0   hydrochlorothiazide  (HYDRODIURIL ) 25 MG tablet Take 1 tablet (25 mg total) by mouth daily. 90 tablet 1   insulin  glargine (LANTUS ) 100 UNIT/ML Solostar Pen Inject 30 Units into the skin daily. 15 mL 6   tirzepatide  (MOUNJARO ) 7.5 MG/0.5ML Pen Inject 7.5 mg into the skin once a week. 2 mL 6   Accu-Chek Softclix Lancets lancets Use as instructed tid before meals (Patient not taking: Reported on 12/02/2023) 100 each 12   blood glucose meter kit and supplies KIT Dispense based on patient and insurance preference. Use up to four times daily as directed. (Patient not taking: Reported on 12/02/2023) 1 each 0   glucose blood (ACCU-CHEK GUIDE) test strip Use as instructed (Patient not taking: Reported on 12/02/2023) 100 each 12   glucose blood (FREESTYLE LITE) test strip Use as instructed (Patient not taking: Reported on 12/02/2023) 100 each 12   Insulin  Pen Needle (PEN NEEDLES) 32G X 4 MM MISC Use to inject insulin  once daily. (Patient not taking: Reported on 12/02/2023) 100 each 6   naproxen  (NAPROSYN ) 500 MG tablet Take 1 tablet (500 mg total) by mouth 2 (two) times daily. (Patient not taking: Reported on 12/02/2023) 30 tablet 0   atorvastatin  (LIPITOR ) 20 MG tablet TAKE 1 TABLET(20 MG) BY MOUTH DAILY (Patient not taking: Reported on  12/02/2023) 90 tablet 1   benzonatate  (TESSALON ) 100 MG capsule Take 1 capsule (100 mg total) by mouth 3 (three) times daily as needed for cough. (Patient not taking: Reported on 12/02/2023) 21 capsule 0   budesonide -formoterol  (SYMBICORT ) 160-4.5 MCG/ACT inhaler Inhale 2 puffs into the lungs daily. (Patient not taking: Reported on 12/02/2023) 1 each 0   conjugated estrogens  (PREMARIN ) vaginal cream Place 0.5 grams vaginally 2 (two) times a week. For 4 weeks (Patient not taking: Reported on 12/02/2023) 42.5 g 0   fluconazole  (DIFLUCAN ) 150 MG tablet Take 150 mg by mouth once. (Patient not taking: Reported on 12/02/2023)  gabapentin  (NEURONTIN ) 300 MG capsule Take 1 capsule (300 mg total) by mouth at bedtime. (Patient not taking: Reported on 12/02/2023) 90 capsule 1   lidocaine  (LIDODERM ) 5 % Place 1 patch onto the skin daily. Remove & Discard patch within 12 hours or as directed by MD (Patient not taking: Reported on 12/02/2023) 30 patch 1   meloxicam  (MOBIC ) 7.5 MG tablet Take 1 tablet (7.5 mg total) by mouth daily. (Patient not taking: Reported on 12/02/2023) 30 tablet 1   tiotropium (SPIRIVA  HANDIHALER) 18 MCG inhalation capsule Place 1 capsule (18 mcg total) into inhaler and inhale daily. (Patient not taking: Reported on 07/04/2023) 30 capsule 6   topiramate  (TOPAMAX ) 25 MG tablet Take 1 tablet (25 mg total) by mouth 2 (two) times daily. (Patient not taking: Reported on 12/02/2023) 60 tablet 3   triamcinolone  cream (KENALOG ) 0.1 % Apply 1 Application topically 2 (two) times daily. (Patient not taking: Reported on 12/02/2023) 45 g 1   valsartan  (DIOVAN ) 80 MG tablet Take 1 tablet (80 mg total) by mouth daily. (Patient not taking: Reported on 12/02/2023) 90 tablet 1   No facility-administered medications prior to visit.     ROS Review of Systems  Constitutional:  Negative for activity change and appetite change.  HENT:  Negative for sinus pressure and sore throat.   Respiratory:  Negative for chest tightness,  shortness of breath and wheezing.   Cardiovascular:  Negative for chest pain and palpitations.  Gastrointestinal:  Positive for constipation. Negative for abdominal distention and abdominal pain.  Genitourinary: Negative.   Musculoskeletal:        See HPI  Psychiatric/Behavioral:  Negative for behavioral problems and dysphoric mood.     Objective:  BP 137/83   Pulse 67   Ht 5\' 8"  (1.727 m)   Wt (!) 312 lb 6.4 oz (141.7 kg)   LMP  (LMP Unknown)   SpO2 100%   BMI 47.50 kg/m      12/02/2023   11:02 AM 12/02/2023   10:23 AM 11/22/2023    3:42 PM  BP/Weight  Systolic BP 137 149 132  Diastolic BP 83 84 81  Wt. (Lbs)  312.4   BMI  47.5 kg/m2     Wt Readings from Last 3 Encounters:  12/02/23 (!) 312 lb 6.4 oz (141.7 kg)  09/02/23 (!) 322 lb (146.1 kg)  07/04/23 (!) 334 lb (151.5 kg)      Physical Exam Constitutional:      Appearance: She is well-developed.  Cardiovascular:     Rate and Rhythm: Normal rate.     Heart sounds: Normal heart sounds. No murmur heard. Pulmonary:     Effort: Pulmonary effort is normal.     Breath sounds: Normal breath sounds. No wheezing or rales.  Chest:     Chest wall: No tenderness.  Abdominal:     General: Bowel sounds are normal. There is no distension.     Palpations: Abdomen is soft. There is no mass.     Tenderness: There is no abdominal tenderness.  Musculoskeletal:     Right lower leg: No edema.     Left lower leg: No edema.     Comments: Slight edema and tenderness of dorsum of right wrist  Neurological:     Mental Status: She is alert and oriented to person, place, and time.  Psychiatric:        Mood and Affect: Mood normal.        Latest Ref Rng & Units 09/02/2023  10:42 AM 04/22/2023    1:51 PM 04/11/2023    6:52 PM  CMP  Glucose 70 - 99 mg/dL 86  478  295   BUN 6 - 24 mg/dL 10  11  13    Creatinine 0.57 - 1.00 mg/dL 6.21  3.08  6.57   Sodium 134 - 144 mmol/L 142  136  137   Potassium 3.5 - 5.2 mmol/L 4.3  3.5  4.2    Chloride 96 - 106 mmol/L 104  103  99   CO2 20 - 29 mmol/L 23  25  22    Calcium  8.7 - 10.2 mg/dL 9.7  8.7  9.7   Total Protein 6.0 - 8.5 g/dL 7.0  6.8  7.4   Total Bilirubin 0.0 - 1.2 mg/dL 0.4  0.6  0.4   Alkaline Phos 44 - 121 IU/L 122  101  111   AST 0 - 40 IU/L 23  34  43   ALT 0 - 32 IU/L 21  41  39     Lipid Panel     Component Value Date/Time   CHOL 189 09/02/2023 1042   TRIG 78 09/02/2023 1042   HDL 46 09/02/2023 1042   CHOLHDL 3.8 11/23/2021 1223   CHOLHDL 5.1 02/13/2021 0932   VLDL 26 02/13/2021 0932   LDLCALC 129 (H) 09/02/2023 1042    CBC    Component Value Date/Time   WBC 8.0 04/22/2023 1148   RBC 5.04 04/22/2023 1148   HGB 13.2 04/22/2023 1148   HGB 12.6 02/28/2021 1629   HCT 42.0 04/22/2023 1148   HCT 38.4 02/28/2021 1629   PLT 256 04/22/2023 1148   PLT 265 02/28/2021 1629   MCV 83.3 04/22/2023 1148   MCV 82 02/28/2021 1629   MCH 26.2 04/22/2023 1148   MCHC 31.4 04/22/2023 1148   RDW 13.9 04/22/2023 1148   RDW 13.4 02/28/2021 1629   LYMPHSABS 1.9 04/22/2023 1148   LYMPHSABS 2.8 02/28/2021 1629   MONOABS 0.4 04/22/2023 1148   EOSABS 0.1 04/22/2023 1148   EOSABS 0.2 02/28/2021 1629   BASOSABS 0.0 04/22/2023 1148   BASOSABS 0.1 02/28/2021 1629    Lab Results  Component Value Date   HGBA1C 5.7 12/02/2023       Assessment & Plan Right Wrist Tendonitis Recent episode with swelling and pain. Orthopedic referral requested. - Refer to orthopedics for evaluation and management. - Advise use of naproxen  as prescribed.  Type 2 Diabetes Mellitus Well-controlled with A1c improved to 5.7. Tolerating Mounjaro  well, prefers not to increase dose. - Continue Tirzepatide  (Mounjaro ) 7.5 MG/0.5ML Pen weekly. - Continue Insulin  glargine (Lantus ) 30 Units daily. - Monitor blood glucose regularly. - Encourage weight loss and lifestyle modifications.  Hypertension Blood pressure normalized on recheck. Valsartan  continued, hydrochlorothiazide  discontinued  due to diuretic side effects. - Continue valsartan  80 mg daily. - Discontinue hydrochlorothiazide  25 mg. - Monitor blood pressure regularly. - Reassess management in six months. -Counseled on blood pressure goal of less than 130/80, low-sodium, DASH diet, medication compliance, 150 minutes of moderate intensity exercise per week. Discussed medication compliance, adverse effects.   Hyperlipidemia Unable to locate atorvastatin  medication. - Refill atorvastatin  20 mg prescription. - Encourage adherence to medication regimen. - Low-cholesterol diet  Constipation Intermittent constipation, possibly related to Mounjaro  use. - Consider laxative use if needed.      Meds ordered this encounter  Medications   atorvastatin  (LIPITOR ) 20 MG tablet    Sig: TAKE 1 TABLET(20 MG) BY MOUTH DAILY  Dispense:  90 tablet    Refill:  1   insulin  glargine (LANTUS ) 100 UNIT/ML Solostar Pen    Sig: Inject 30 Units into the skin daily.    Dispense:  15 mL    Refill:  6   tirzepatide  (MOUNJARO ) 7.5 MG/0.5ML Pen    Sig: Inject 7.5 mg into the skin once a week.    Dispense:  2 mL    Refill:  6   valsartan  (DIOVAN ) 80 MG tablet    Sig: Take 1 tablet (80 mg total) by mouth daily.    Dispense:  90 tablet    Refill:  1    Follow-up: No follow-ups on file.       Joaquin Mulberry, MD, FAAFP. [facility] [place], [place] [phone]   12/02/2023, 11:03 AM

## 2023-12-02 NOTE — Patient Instructions (Signed)
 VISIT SUMMARY:  Today, you had a follow-up visit to review your hypertension and type 2 diabetes management. You have successfully lost 22 pounds since December, and your diabetes is well-controlled with an improved A1c level. We also discussed your recent right wrist pain, constipation issues, and medication refills.  YOUR PLAN:  -RIGHT WRIST TENDONITIS: Tendonitis is inflammation of the tendons, causing pain and swelling. You will be referred to an orthopedic specialist for further evaluation and management. Continue using naproxen  as prescribed for pain relief.  -TYPE 2 DIABETES MELLITUS: Type 2 diabetes is a condition where your body does not use insulin  properly, leading to high blood sugar levels. Your A1c has improved to 5.7, indicating good control. Continue taking Mounjaro  7.5 mg weekly and Lantus  30 units daily. Keep monitoring your blood glucose regularly and maintain your weight loss and lifestyle changes.  -HYPERTENSION: Hypertension is high blood pressure. Your blood pressure is now normal. Continue taking valsartan  80 mg daily and stop taking hydrochlorothiazide  25 mg due to side effects. Monitor your blood pressure regularly and we will reassess in six months.  -HYPERLIPIDEMIA: Hyperlipidemia is high cholesterol levels in the blood. We will refill your atorvastatin  20 mg prescription. Please make sure to take your medication as prescribed.  -CONSTIPATION: Constipation is infrequent bowel movements or difficulty passing stools. This may be related to your use of Mounjaro . Try having Activia yogurt daily to help with regularity, and consider using a laxative if needed.  INSTRUCTIONS:  You will be referred to an orthopedic specialist for your right wrist tendonitis. Please continue monitoring your blood glucose and blood pressure regularly. We will reassess your hypertension management in six months. Make sure to refill and take your atorvastatin  as prescribed. If constipation  persists, consider using a laxative.

## 2023-12-09 ENCOUNTER — Ambulatory Visit: Admitting: Physician Assistant

## 2023-12-20 IMAGING — DX DG CHEST 2V
2 series · 2 of 2 positions shown · non-contrast
Comparison: Chest radiograph dated February 12, 2021

CLINICAL DATA: Productive cough

EXAM:
CHEST - 2 VIEW

[chest lat]
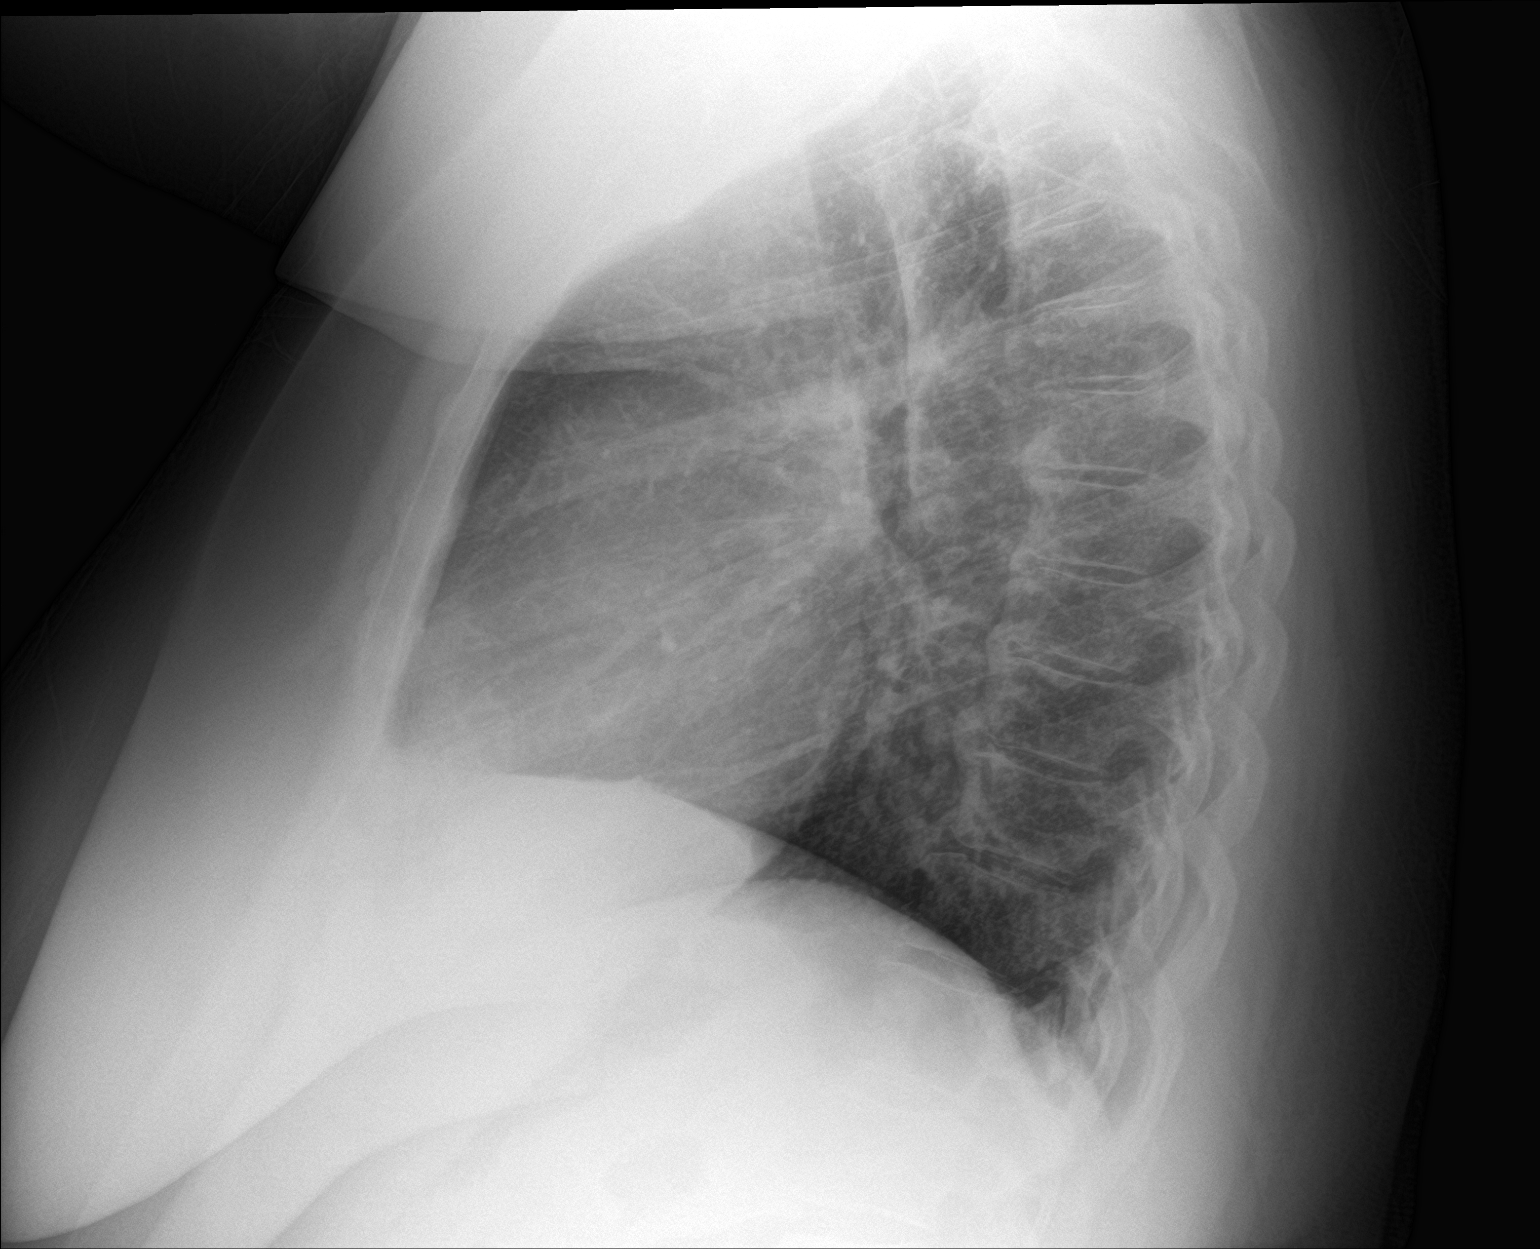

[chest pa]
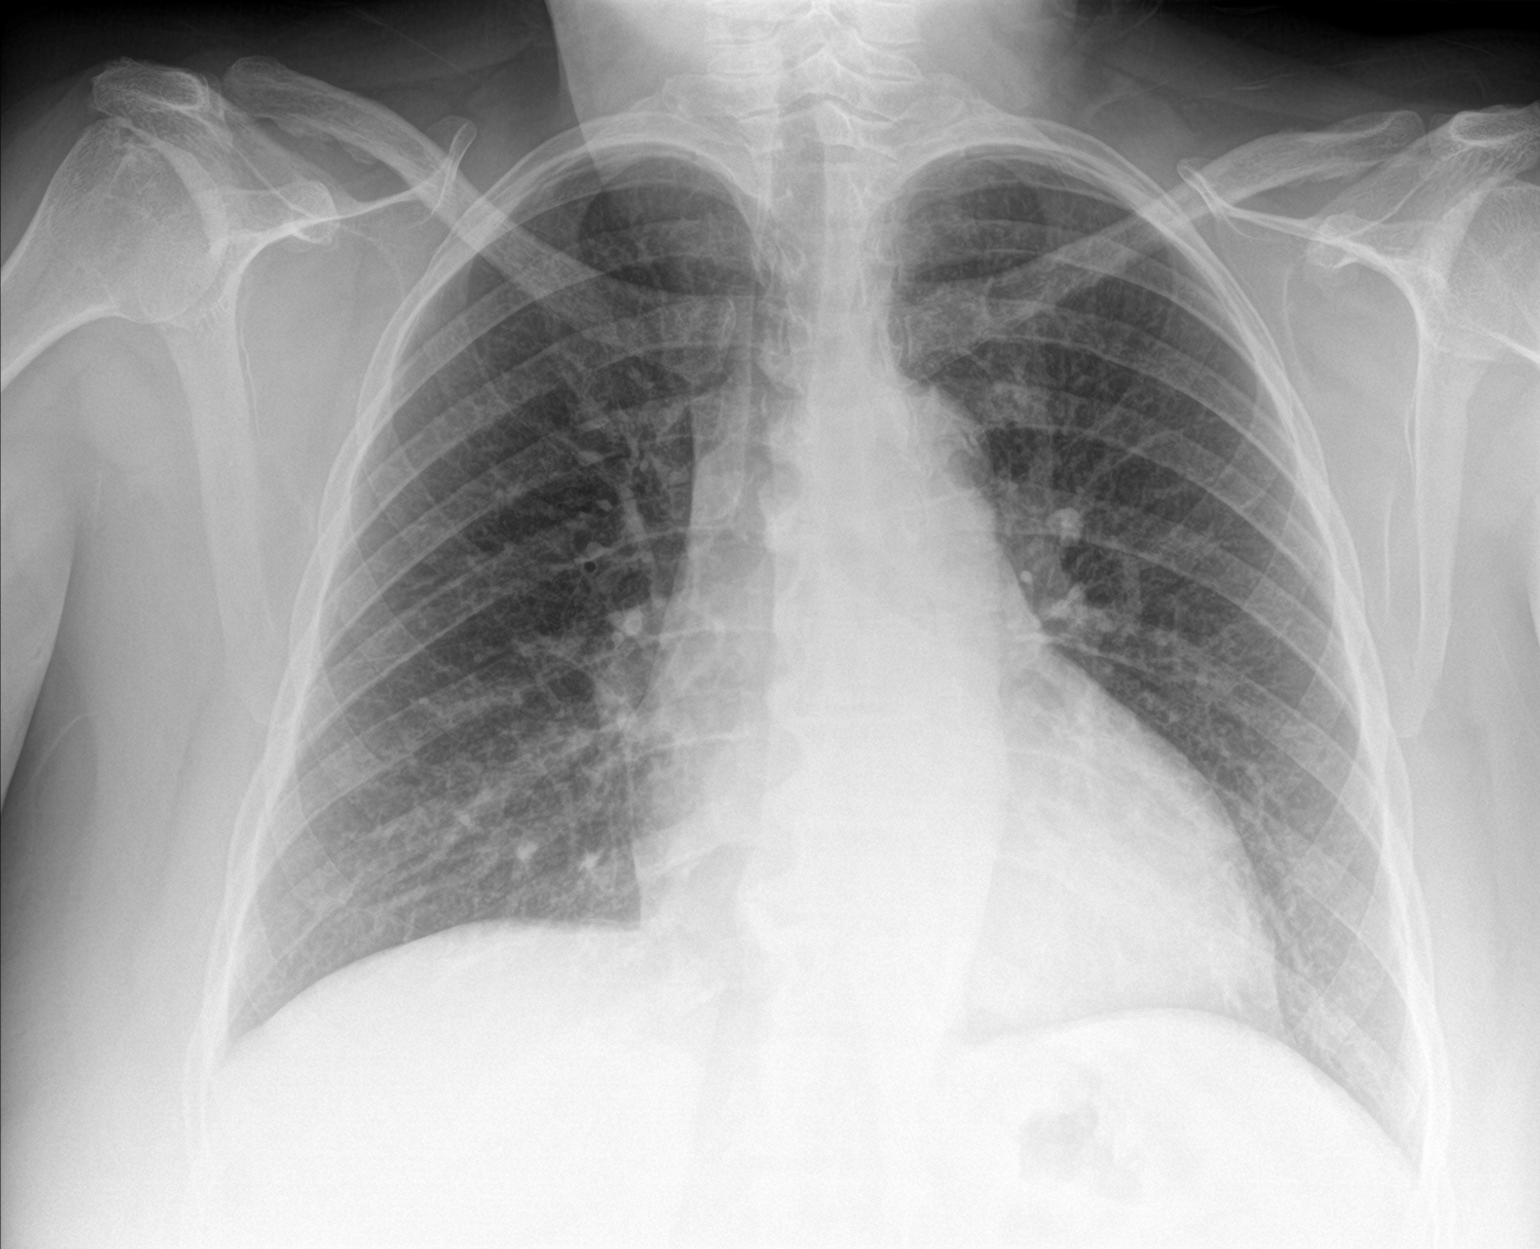

[2 of 2 positions shown; findings below may reference images not displayed]

FINDINGS: The heart mildly enlarged. Both lungs are clear. The visualized
skeletal structures are unremarkable.
IMPRESSION: No active cardiopulmonary disease.

## 2024-01-02 ENCOUNTER — Encounter (HOSPITAL_COMMUNITY): Payer: Self-pay | Admitting: *Deleted

## 2024-01-02 ENCOUNTER — Emergency Department (HOSPITAL_COMMUNITY)
Admission: EM | Admit: 2024-01-02 | Discharge: 2024-01-02 | Disposition: A | Discharge status: Home / Self Care | Attending: Emergency Medicine | Admitting: Emergency Medicine

## 2024-01-02 ENCOUNTER — Other Ambulatory Visit: Payer: Self-pay

## 2024-01-02 ENCOUNTER — Emergency Department (HOSPITAL_COMMUNITY)

## 2024-01-02 DIAGNOSIS — I10 Essential (primary) hypertension: Secondary | ICD-10-CM | POA: Diagnosis not present

## 2024-01-02 DIAGNOSIS — X501XXA Overexertion from prolonged static or awkward postures, initial encounter: Secondary | ICD-10-CM | POA: Diagnosis not present

## 2024-01-02 DIAGNOSIS — E119 Type 2 diabetes mellitus without complications: Secondary | ICD-10-CM | POA: Insufficient documentation

## 2024-01-02 DIAGNOSIS — S8262XA Displaced fracture of lateral malleolus of left fibula, initial encounter for closed fracture: Secondary | ICD-10-CM | POA: Insufficient documentation

## 2024-01-02 DIAGNOSIS — Z794 Long term (current) use of insulin: Secondary | ICD-10-CM | POA: Diagnosis not present

## 2024-01-02 DIAGNOSIS — Z79899 Other long term (current) drug therapy: Secondary | ICD-10-CM | POA: Diagnosis not present

## 2024-01-02 DIAGNOSIS — M19072 Primary osteoarthritis, left ankle and foot: Secondary | ICD-10-CM | POA: Diagnosis not present

## 2024-01-02 DIAGNOSIS — M79672 Pain in left foot: Secondary | ICD-10-CM | POA: Diagnosis not present

## 2024-01-02 DIAGNOSIS — M7752 Other enthesopathy of left foot: Secondary | ICD-10-CM | POA: Diagnosis not present

## 2024-01-02 DIAGNOSIS — S99912A Unspecified injury of left ankle, initial encounter: Secondary | ICD-10-CM | POA: Diagnosis present

## 2024-01-02 DIAGNOSIS — S8265XA Nondisplaced fracture of lateral malleolus of left fibula, initial encounter for closed fracture: Secondary | ICD-10-CM | POA: Diagnosis not present

## 2024-01-02 MED ORDER — OXYCODONE-ACETAMINOPHEN 5-325 MG PO TABS
1.0000 | ORAL_TABLET | Freq: Once | ORAL | Status: AC
  Administered 2024-01-02: 1 via ORAL
  Filled 2024-01-02: qty 1

## 2024-01-02 MED ORDER — OXYCODONE HCL 5 MG PO TABS: 5.0000 mg | ORAL_TABLET | ORAL | 0 refills | Status: DC | PRN

## 2024-01-02 MED ORDER — IBUPROFEN 200 MG PO TABS
600.0000 mg | ORAL_TABLET | Freq: Once | ORAL | Status: AC
  Administered 2024-01-02: 600 mg via ORAL
  Filled 2024-01-02: qty 3

## 2024-01-02 NOTE — Discharge Instructions (Addendum)
 Apply ice for 30 minutes at a time, 4 times a day.  Take ibuprofen  or naproxen  as needed for pain.  If you need additional pain relief, add acetaminophen .  If you still need additional pain relief, add oxycodone .  Use crutches until the orthopedic doctor tells you that it is okay for you to put weight on your leg.

## 2024-01-02 NOTE — ED Triage Notes (Signed)
 Pt drove here, says she was hugging her son, and she heard and felt a "pop" in her left ankle and fell to the ground. She has swelling to the left ankle. Pain in the bottom of the left foot, through the ankle and goes up her leg. Occurred around midnight. No meds for the same.

## 2024-01-02 NOTE — ED Provider Notes (Signed)
 Nicole [name]   CSN: [id number] Arrival date & time: 01/02/24  0215     History  Chief Complaint  Patient presents with   Ankle Pain    Nicole [name] is a 48 y.o. female.  The history is provided by the patient.  Ankle Pain She has history of hypertension, diabetes, hyperlipidemia and comes in following injury to her left ankle.  She states that she had not seen her son in a long time and as they were hugging, she felt something pop in her left ankle.  She is complaining of pain rather diffusely in her left foot and ankle.  She denies other injury.   Home Medications Prior to Admission medications   Medication Sig Start Date End Date Taking? Authorizing Provider  oxyCODONE  (ROXICODONE ) 5 MG immediate release tablet Take 1 tablet (5 mg total) by mouth every 4 (four) hours as needed for severe pain (pain score 7-10). 01/02/24  Yes Alissa April, MD  albuterol  (VENTOLIN  HFA) 108 (90 Base) MCG/ACT inhaler Inhale 2 puffs into the lungs every 6 (six) hours as needed for wheezing or shortness of breath. 06/21/23   Ann Keto, MD  atorvastatin  (LIPITOR ) 20 MG tablet TAKE 1 TABLET(20 MG) BY MOUTH DAILY 12/02/23   Newlin, Enobong, MD  insulin  glargine (LANTUS ) 100 UNIT/ML Solostar Pen Inject 30 Units into the skin daily. 12/02/23   Newlin, Enobong, MD  tirzepatide  (MOUNJARO ) 7.5 MG/0.5ML Pen Inject 7.5 mg into the skin once a week. 12/02/23   Newlin, Enobong, MD  valsartan  (DIOVAN ) 80 MG tablet Take 1 tablet (80 mg total) by mouth daily. 12/02/23   Newlin, Enobong, MD  metoprolol  tartrate (LOPRESSOR ) 100 MG tablet Take 1 tablet by mouth once for procedure. 04/05/21   O'Neal, Cathay Clonts, MD      Allergies    Patient has no known allergies.    Review of Systems   Review of Systems  All other systems reviewed and are negative.   Physical Exam Updated Vital Signs BP (!) 156/83   Pulse 85   Temp 98.8 F (37.1 C) (Oral)   Resp  (!) 21   LMP  (LMP Unknown)   SpO2 100%  Physical Exam Vitals and nursing [name] reviewed.   48 year old female, resting comfortably and in no acute distress. Vital signs are significant for elevated blood pressure. Oxygen saturation is 100%, which is normal. Head is normocephalic and atraumatic. PERRLA, EOMI.  Lungs are clear without rales, wheezes, or rhonchi. Heart has regular rate and rhythm without murmur. Extremities have 1+ edema.  There is tenderness rather diffusely around the left ankle, midfoot, hindfoot.  There is no tenderness over the proximal fibula.  Maximum tenderness is over the distal left fibula.  There is no instability of the ankle mortise. Skin is warm and dry without rash. Neurologic: Awake and alert, moves all extremities equally.  ED Results / Procedures / Treatments    Radiology DG Ankle Complete Left Result Date: 01/02/2024 CLINICAL DATA:  Left ankle pain and swelling EXAM: LEFT ANKLE COMPLETE - 3+ VIEW COMPARISON:  None Available. FINDINGS: Acute, minimally displaced fracture of the distal fibula noted with fracture fragments in grossly anatomic alignment. Fracture plane is seen below the level of the tibial plafond. There is widening of the lateral joint space in keeping with injury involving the lateral ligamentous complex. No dislocation. No additional fracture identified. Moderate midfoot degenerative arthritis. Moderate diffuse subcutaneous edema within the left  ankle. IMPRESSION: 1. Acute, minimally displaced fracture of the distal fibula. 2. Lateral ligamentous injury. Electronically Signed   By: Worthy Heads M.D.   On: 01/02/2024 04:23   DG Foot 2 Views Left Result Date: 01/02/2024 CLINICAL DATA:  Foot pain/injury EXAM: LEFT FOOT - 2 VIEW COMPARISON:  None Available. FINDINGS: No fracture or dislocation is seen. Moderate degenerative changes of the dorsal midfoot. Mild soft tissue swelling along the ankle. Small plantar calcaneal enthesophyte. IMPRESSION: No  fracture or dislocation is seen. Electronically Signed   By: Zadie Herter M.D.   On: 01/02/2024 03:08    Procedures .Ortho Injury Treatment  Date/Time: 01/02/2024 4:41 AM  Performed by: Alissa April, MD Authorized by: Alissa April, MD   Consent:    Consent obtained:  Verbal   Consent given by:  Patient   Risks discussed:  Restricted joint movement and stiffness   Alternatives discussed:  Alternative treatmentInjury location: ankle Location details: left ankle Injury type: fracture Fracture type: lateral malleolus Pre-procedure neurovascular assessment: neurovascularly intact Pre-procedure distal perfusion: normal Pre-procedure neurological function: normal Pre-procedure range of motion: reduced  Anesthesia: Local anesthesia used: no  Patient sedated: NoManipulation performed: no Immobilization: crutches (CAM Boot) Splint Applied by: ED Nurse Supplies used: CAM Boot. Post-procedure neurovascular assessment: post-procedure neurovascularly intact Post-procedure distal perfusion: normal Post-procedure neurological function: normal Post-procedure range of motion: unchanged       Medications Ordered in ED Medications  oxyCODONE -acetaminophen  (PERCOCET/ROXICET) 5-325 MG per tablet 1 tablet (1 tablet Oral Given 01/02/24 0325)  ibuprofen  (ADVIL ) tablet 600 mg (600 mg Oral Given 01/02/24 0325)    ED Course/ Medical Decision Making/ A&P                                 Medical Decision Making Amount and/or Complexity of Data Reviewed Radiology: ordered.  Risk OTC drugs. Prescription drug management.   Left ankle injury.  X-rays do show a nondisplaced fracture of the distal portion of the lateral malleolus.  I have independently viewed all of the images, and agree with the radiologist's interpretation.  I have ordered a cam boot be applied and crutches.  I have instructed the patient on ice and elevation.  I have advised her to use over-the-counter NSAIDs and acetaminophen   as needed for pain, I have ordered a prescription for oxycodone  for pain not controlled by NSAIDs and acetaminophen .  I have referred her to orthopedics for follow-up.  Final Clinical Impression(s) / ED Diagnoses Final diagnoses:  Closed fracture of distal lateral malleolus of ankle, left, initial encounter    Rx / DC Orders ED Discharge Orders          Ordered    oxyCODONE  (ROXICODONE ) 5 MG immediate release tablet  Every 4 hours PRN        01/02/24 0439              Alissa April, MD 01/02/24 [phone]

## 2024-01-02 NOTE — ED Notes (Signed)
 This RN applied CAM boot to L foot. Pt demonstrated use of crutches.

## 2024-01-06 ENCOUNTER — Telehealth: Payer: Self-pay | Admitting: Family Medicine

## 2024-01-06 NOTE — Telephone Encounter (Signed)
 Copied from CRM [id number]. Topic: Clinical - Request for Lab/Test Order >> Jan 06, 2024  2:03 PM Nicole [name] wrote:  Reason for CRM: patient states she believes her PH balance is off, requesting orders for a urine sample. Please advise, [phone]

## 2024-01-08 ENCOUNTER — Ambulatory Visit: Payer: Self-pay | Admitting: Family Medicine

## 2024-01-08 ENCOUNTER — Other Ambulatory Visit (HOSPITAL_COMMUNITY)
Admission: RE | Admit: 2024-01-08 | Discharge: 2024-01-08 | Disposition: A | Discharge status: Home / Self Care | Source: Ambulatory Visit | Attending: Family Medicine | Admitting: Family Medicine

## 2024-01-08 ENCOUNTER — Ambulatory Visit: Attending: Family Medicine

## 2024-01-08 DIAGNOSIS — N898 Other specified noninflammatory disorders of vagina: Secondary | ICD-10-CM | POA: Insufficient documentation

## 2024-01-08 LAB — POCT URINALYSIS DIP (CLINITEK)
Bilirubin, UA: NEGATIVE
Blood, UA: NEGATIVE
Glucose, UA: NEGATIVE mg/dL
Ketones, POC UA: NEGATIVE mg/dL
Leukocytes, UA: NEGATIVE
Nitrite, UA: NEGATIVE
POC PROTEIN,UA: 30 — AB
Spec Grav, UA: >=1.03 — AB (ref 1.010–1.025)
Urobilinogen, UA: 0.2 U/dL
pH, UA: 5.5 (ref 5.0–8.0)

## 2024-01-08 NOTE — Progress Notes (Signed)
 Patient in office today for POCT URINALYSIS DIP and Cervicovaginal swab. Results to  URINALYSIS entered in chart.

## 2024-01-08 NOTE — Telephone Encounter (Signed)
 Copied from CRM [id number]. Topic: Clinical - Medical Advice >> Jan 08, 2024 10:10 AM Sasha H wrote:  Reason for CRM: Pt was returning Nicole [name]'s call. States she would really like to come for the urine sample today at 1:30

## 2024-01-08 NOTE — Telephone Encounter (Signed)
 Patient called and placed on nurse schedule for testing.

## 2024-01-08 NOTE — Telephone Encounter (Signed)
 Patient returning call back

## 2024-01-08 NOTE — Telephone Encounter (Signed)
 LVM for patient to return call.

## 2024-01-21 ENCOUNTER — Other Ambulatory Visit (INDEPENDENT_AMBULATORY_CARE_PROVIDER_SITE_OTHER): Payer: Self-pay

## 2024-01-21 ENCOUNTER — Ambulatory Visit: Admitting: Physician Assistant

## 2024-01-21 ENCOUNTER — Encounter: Payer: Self-pay | Admitting: Physician Assistant

## 2024-01-21 DIAGNOSIS — M25572 Pain in left ankle and joints of left foot: Secondary | ICD-10-CM

## 2024-01-21 NOTE — Progress Notes (Signed)
 Office Visit Note   Patient: Nicole [name]           Date of Birth: 11-25-1975           MRN: [id number] Visit Date: 01/21/2024              Requested by: Delbert Clam, MD [address] [place],  [place] [address] PCP: Delbert Clam, MD   Assessment & Plan: Visit Diagnoses:  1. Pain in left ankle and joints of left foot     Plan: Patient is 3 weeks status post large pop in her ankle when she twisted it.  X-rays at the time demonstrated a distal lateral malleolus fracture consistent with an avulsion fracture.  She has been in a boot and feels a bit better.  Findings consistent with left ankle sprain with avulsion.  Will place her in a brace today.  Will give her ankle strengthening exercises to do should follow-up in 3 weeks  Follow-Up Instructions: Return in about 3 weeks (around 02/11/2024).   Orders:  Orders Placed This Encounter  Procedures   XR Ankle Complete Left   No orders of the defined types were placed in this encounter.     Procedures: No procedures performed   Clinical Data: No additional findings.   Subjective: No chief complaint on file.   HPI pleasant 48 year old woman who is 3 weeks status post twisting left ankle.  Was seen and evaluated in the emergency room showed a avulsion type fracture in good position of the lateral malleolus was placed in a boot she is doing better she does work as a hairdresser pain bothers her when she is on her feet all day  Review of Systems  All other systems reviewed and are negative.    Objective: Vital Signs: LMP  (LMP Unknown)   Physical Exam Constitutional:      Appearance: Normal appearance.  Pulmonary:     Effort: Pulmonary effort is normal.   Skin:    General: Skin is warm and dry.   Neurological:     General: No focal deficit present.     Mental Status: She is alert and oriented to person, place, and time.   Psychiatric:        Mood and Affect: Mood normal.        Behavior:  Behavior normal.     Ortho Exam Examination of her left ankle compartments are soft and nontender negative Toula' sign she has good pulses she has swelling more prevalent around the lateral than the medial side of the ankle.  She is focally tender over the lateral malleolus but per her son subjection better than previously she is neurovascularly intact she has good dorsiflexion plantarflexion eversion and inversion Specialty Comments:  No specialty comments available.  Imaging: XR Ankle Complete Left Result Date: 01/21/2024 Radiograph of the left ankle demonstrate well reduced distal avulsion type fracture of the lateral malleolus well-maintained alignment    PMFS History: Patient Active Problem List   Diagnosis Date Noted   Pain in left ankle and joints of left foot 01/21/2024   History of diverticulitis 02/28/2021   Antibiotic-induced yeast infection 02/28/2021   Thrombocytopenia (HCC) 02/28/2021   Hypokalemia 02/28/2021   Sigmoid diverticulitis 02/11/2021   Bacterial vaginitis 12/19/2020   Anxiety and depression 06/11/2018   Vitamin D  deficiency 01/08/2018   Osteoarthritis 01/08/2018   Type 2 diabetes mellitus (HCC) 01/08/2018   Acute appendicitis 03/25/2014   Hypertension 03/25/2014   Morbid obesity (HCC)  03/24/2014   Benign essential HTN 03/24/2014   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   Chronic back pain 03/24/2014   Primary osteoarthritis of both knees 03/24/2014   Tobacco use 03/24/2014   Past Medical History:  Diagnosis Date   Arthritis    knees   Diabetes mellitus without complication (HCC)    Hyperlipidemia    Hypertension    Ovarian cyst    Pregnancy induced hypertension    Type 2 diabetes mellitus (HCC) 01/08/2018    Family History  Problem Relation Age of Onset   Hypertension Mother    Cancer Mother        pancreatic cancer   Arthritis Father    Heart disease Maternal Grandmother     Past Surgical History:  Procedure Laterality Date   APPENDECTOMY      CESAREAN SECTION     HERNIA REPAIR     LAPAROSCOPIC APPENDECTOMY N/A 03/25/2014   Procedure: APPENDECTOMY LAPAROSCOPIC;  Surgeon: Lynwood MALVA Pina, MD;  Location: [facility] OR;  Service: General;  Laterality: N/A;   Social History   Occupational History   Not on file  Tobacco Use   Smoking status: Former    Current packs/day: 0.00    Average packs/day: 0.5 packs/day for 27.0 years (13.5 ttl pk-yrs)    Types: Cigarettes    Start date: 10/28/1993    Quit date: 10/28/2020    Years since quitting: 3.2   Smokeless tobacco: Never   Tobacco comments:    6 cigs daily  Vaping Use   Vaping status: Never Used  Substance and Sexual Activity   Alcohol use: Not Currently    Comment: occ   Drug use: Yes    Types: Marijuana    Comment: periodically   Sexual activity: Not Currently    Comment: 5 years

## 2024-02-03 ENCOUNTER — Emergency Department (HOSPITAL_COMMUNITY): Admission: EM | Admit: 2024-02-03 | Discharge: 2024-02-03 | Disposition: A | Discharge status: Home / Self Care

## 2024-02-03 ENCOUNTER — Other Ambulatory Visit: Payer: Self-pay

## 2024-02-03 ENCOUNTER — Emergency Department (HOSPITAL_COMMUNITY)

## 2024-02-03 DIAGNOSIS — R0989 Other specified symptoms and signs involving the circulatory and respiratory systems: Secondary | ICD-10-CM | POA: Diagnosis not present

## 2024-02-03 DIAGNOSIS — I517 Cardiomegaly: Secondary | ICD-10-CM | POA: Diagnosis not present

## 2024-02-03 DIAGNOSIS — Z794 Long term (current) use of insulin: Secondary | ICD-10-CM | POA: Diagnosis not present

## 2024-02-03 DIAGNOSIS — R059 Cough, unspecified: Secondary | ICD-10-CM | POA: Insufficient documentation

## 2024-02-03 DIAGNOSIS — R079 Chest pain, unspecified: Secondary | ICD-10-CM

## 2024-02-03 DIAGNOSIS — I10 Essential (primary) hypertension: Secondary | ICD-10-CM | POA: Diagnosis not present

## 2024-02-03 DIAGNOSIS — R918 Other nonspecific abnormal finding of lung field: Secondary | ICD-10-CM | POA: Diagnosis not present

## 2024-02-03 DIAGNOSIS — R0789 Other chest pain: Secondary | ICD-10-CM | POA: Diagnosis not present

## 2024-02-03 LAB — CBC WITH DIFFERENTIAL/PLATELET
Abs Immature Granulocytes: 0.03 K/uL (ref 0.00–0.07)
Basophils Absolute: 0 K/uL (ref 0.0–0.1)
Basophils Relative: 0 %
Eosinophils Absolute: 0.2 K/uL (ref 0.0–0.5)
Eosinophils Relative: 2 %
HCT: 43.2 % (ref 36.0–46.0)
Hemoglobin: 13.4 g/dL (ref 12.0–15.0)
Immature Granulocytes: 0 %
Lymphocytes Relative: 29 %
Lymphs Abs: 2.8 K/uL (ref 0.7–4.0)
MCH: 26.5 pg (ref 26.0–34.0)
MCHC: 31 g/dL (ref 30.0–36.0)
MCV: 85.5 fL (ref 80.0–100.0)
Monocytes Absolute: 0.7 K/uL (ref 0.1–1.0)
Monocytes Relative: 7 %
Neutro Abs: 5.9 K/uL (ref 1.7–7.7)
Neutrophils Relative %: 62 %
Platelets: 224 K/uL (ref 150–400)
RBC: 5.05 MIL/uL (ref 3.87–5.11)
RDW: 14.3 % (ref 11.5–15.5)
WBC: 9.6 K/uL (ref 4.0–10.5)
nRBC: 0 % (ref 0.0–0.2)

## 2024-02-03 LAB — COMPREHENSIVE METABOLIC PANEL WITH GFR
ALT: 34 U/L (ref 0–44)
AST: 31 U/L (ref 15–41)
Albumin: 4 g/dL (ref 3.5–5.0)
Alkaline Phosphatase: 121 U/L (ref 38–126)
Anion gap: 9 (ref 5–15)
BUN: 19 mg/dL (ref 6–20)
CO2: 24 mmol/L (ref 22–32)
Calcium: 9.5 mg/dL (ref 8.9–10.3)
Chloride: 105 mmol/L (ref 98–111)
Creatinine, Ser: 0.87 mg/dL (ref 0.44–1.00)
GFR, Estimated: >60 mL/min (ref >60)
Glucose, Bld: 83 mg/dL (ref 70–99)
Potassium: 4.2 mmol/L (ref 3.5–5.1)
Sodium: 138 mmol/L (ref 135–145)
Total Bilirubin: 0.8 mg/dL (ref 0.0–1.2)
Total Protein: 7.7 g/dL (ref 6.5–8.1)

## 2024-02-03 LAB — PROTIME-INR
INR: 0.9 (ref 0.8–1.2)
Prothrombin Time: 12.6 s (ref 11.4–15.2)

## 2024-02-03 LAB — RESP PANEL BY RT-PCR (RSV, FLU A&B, COVID)  RVPGX2
Influenza A by PCR: NEGATIVE
Influenza B by PCR: NEGATIVE
Resp Syncytial Virus by PCR: NEGATIVE
SARS Coronavirus 2 by RT PCR: NEGATIVE

## 2024-02-03 LAB — TROPONIN I (HIGH SENSITIVITY)
Troponin I (High Sensitivity): 4 ng/L (ref <18)
Troponin I (High Sensitivity): 4 ng/L (ref <18)

## 2024-02-03 MED ORDER — IOHEXOL 350 MG/ML SOLN
80.0000 mL | Freq: Once | INTRAVENOUS | Status: AC | PRN
  Administered 2024-02-03: 80 mL via INTRAVENOUS

## 2024-02-03 NOTE — ED Triage Notes (Signed)
 Patient to ED by POV with c/o back pain that shoots to chest and into left arm. Pain started yesterday along with HA and nausea. HA radiates to jaw and neck.

## 2024-02-03 NOTE — ED Notes (Addendum)
 Entered pt room to introduce myself and explain plan of care set by provider. Pt was sitting on the side of the bed. I asked pt to lay back on the bed. Pt responded aggressively stating I don't want to. I tried to explain to the patient that she can sit back up if necessary as soon as I get her IV in place and do her EKG. Pt finally laid back, when I was trying to get an EKG the monitor was not picking up a couple leads so I asked another nurse to come in and help me fix it. Pt then called her sister on facetime trying to show her sister myself and the other nurse in the room. Pt stating to sister that I was being aggressive and rude to her forcing her to lay down. I told the pt she cannot be on facetime trying to record staff. The other nurse left the room after the EKG was done. I then told pt I would start her IV so I could get her bloodwork. Pt then proceeded to yell at me stating I'm not going to fucking lay back. Don't talk to me like I'm a fucking child. I asked pt why she was yelling and cussing at me for no reason. One of our techs heard the pt yelling and had to step in front of pt because the pt was making physically threatening gestures at me. DON also heard pt yelling and came into the room. DON backed me up in telling pt that she did need to lay back just for her IV and also explained to pt that her being on facetime trying to show staff was not allowed. Pt told DON She can't talk to me like that, it triggers me. I'm trying to be a better person but if she had kept talking I'd be on my way to jail. At this point pt continued to make threats and accusations against nurse. I told DON I will trade pts with another nurse since this pt continues to make threats against me. I do not feel safe with this pt. Pts sister on phone encouraging her behavior and telling the pt to report us  to the board of nursing. Sister then stated that she was coming to sit with pt. Nurse taking over for pt as well as  other staff have been instructed to leave door open when interacting with pt as much as possible due to her behaviors.

## 2024-02-03 NOTE — ED Provider Notes (Signed)
 [place] EMERGENCY DEPARTMENT AT [facility] Provider Note   CSN: [id number] Arrival date & time: 02/03/24  1401     Patient presents with: Nausea, Dizziness, and Jaw Pain   Nicole [name] is a 48 y.o. female.    Dizziness  Presents because of some chest pain, cough, body aches.  Patient states on Saturday he started feeling ill.  Start with some chills and bodyaches.  Discontinued on Sunday.  Around this time, started noticing some chest pain that seem to be starting in the back and radiate to the chest.  Some shortness of breath.  No history of DVT or PE.  Takes no birth control.  No recent travel history.  No significant leg pain or swelling of this point time.  No hemoptysis.  Endorse maybe some slight pleuritic chest pain.  No obvious exertional chest pain.  Endorsing episodes of nausea but no vomiting.  No abdominal pain.  Patient states he is not aware of any kind of sick contacts.  No vision changes, no numbness or tingling anywhere, no vertigo.  No diplopia.   Previous medical history reviewed : Patient was last seen in ED on January 02, 2024.  Present because ankle pain.      Prior to Admission medications   Medication Sig Start Date End Date Taking? Authorizing Provider  albuterol  (VENTOLIN  HFA) 108 (90 Base) MCG/ACT inhaler Inhale 2 puffs into the lungs every 6 (six) hours as needed for wheezing or shortness of breath. 06/21/23   Vonna Sharlet POUR, MD  atorvastatin  (LIPITOR ) 20 MG tablet TAKE 1 TABLET(20 MG) BY MOUTH DAILY 12/02/23   Newlin, Enobong, MD  insulin  glargine (LANTUS ) 100 UNIT/ML Solostar Pen Inject 30 Units into the skin daily. 12/02/23   Newlin, Enobong, MD  oxyCODONE  (ROXICODONE ) 5 MG immediate release tablet Take 1 tablet (5 mg total) by mouth every 4 (four) hours as needed for severe pain (pain score 7-10). 01/02/24   Raford Lenis, MD  tirzepatide  (MOUNJARO ) 7.5 MG/0.5ML Pen Inject 7.5 mg into the skin once a week. 12/02/23   Newlin, Enobong, MD   valsartan  (DIOVAN ) 80 MG tablet Take 1 tablet (80 mg total) by mouth daily. 12/02/23   Newlin, Enobong, MD  metoprolol  tartrate (LOPRESSOR ) 100 MG tablet Take 1 tablet by mouth once for procedure. 04/05/21   O'Neal, Darryle Ned, MD    Allergies: Patient has no known allergies.    Review of Systems  Neurological:  Positive for dizziness.    Updated Vital Signs BP (!) 195/101   Pulse 68   Temp 98.5 F (36.9 C) (Oral)   Resp 16   Ht 5' 8 (1.727 m)   Wt (!) 141.1 kg   LMP  (LMP Unknown)   SpO2 99%   BMI 47.29 kg/m   Physical Exam Vitals and nursing note reviewed.  Constitutional:      General: She is not in acute distress.    Appearance: She is well-developed.  HENT:     Head: Normocephalic and atraumatic.  Eyes:     Conjunctiva/sclera: Conjunctivae normal.  Cardiovascular:     Rate and Rhythm: Normal rate and regular rhythm.     Heart sounds: No murmur heard. Pulmonary:     Effort: Pulmonary effort is normal. No respiratory distress.     Breath sounds: Normal breath sounds.  Abdominal:     Palpations: Abdomen is soft.     Tenderness: There is no abdominal tenderness.  Musculoskeletal:  General: No swelling.     Cervical back: Neck supple.  Skin:    General: Skin is warm and dry.     Capillary Refill: Capillary refill takes less than 2 seconds.  Neurological:     Mental Status: She is alert.  Psychiatric:        Mood and Affect: Mood normal.     (all labs ordered are listed, but only abnormal results are displayed) Labs Reviewed  RESP PANEL BY RT-PCR (RSV, FLU A&B, COVID)  [id number]  COMPREHENSIVE METABOLIC PANEL WITH GFR  CBC WITH DIFFERENTIAL/PLATELET  PROTIME-INR  TROPONIN I (HIGH SENSITIVITY)  TROPONIN I (HIGH SENSITIVITY)    EKG: EKG Interpretation Date/Time:  Monday February 03 2024 15:38:30 EDT Ventricular Rate:  62 PR Interval:  179 QRS Duration:  164 QT Interval:  446 QTC Calculation: 453 R Axis:   -2  Text Interpretation: Sinus rhythm  Right bundle branch block Left ventricular hypertrophy Confirmed by Simon Rea [phone]) on 02/03/2024 3:57:15 PM  Radiology: CT Angio Chest PE W and/or Wo Contrast Result Date: 02/03/2024 CLINICAL DATA:  Left-sided chest pain EXAM: CT ANGIOGRAPHY CHEST WITH CONTRAST TECHNIQUE: Multidetector CT imaging of the chest was performed using the standard protocol during bolus administration of intravenous contrast. Multiplanar CT image reconstructions and MIPs were obtained to evaluate the vascular anatomy. RADIATION DOSE REDUCTION: This exam was performed according to the departmental dose-optimization program which includes automated exposure control, adjustment of the mA and/or kV according to patient size and/or use of iterative reconstruction technique. CONTRAST:  80mL OMNIPAQUE  IOHEXOL  350 MG/ML SOLN COMPARISON:  Chest x-ray 02/03/2024, CT chest 02/13/2021 FINDINGS: Cardiovascular: Satisfactory opacification of the pulmonary arteries to the segmental level. No evidence of pulmonary embolism. Nonaneurysmal aorta. Upper normal cardiac size. No pericardial effusion Mediastinum/Nodes: Patent trachea. No suspicious thyroid  mass. No suspicious lymph nodes. Esophagus within normal limits Lungs/Pleura: No acute airspace disease, pleural effusion or pneumothorax. Multiple small pulmonary nodules, for example 5 mm right middle lobe pulmonary nodule on series 6, image 72, 6 mm right lower lobe pulmonary nodule on series 6, image 70, and subpleural left lower lobe 5 mm pulmonary nodule on series 6, image 81. Multiple additional small pulmonary nodules Upper Abdomen: No acute finding Musculoskeletal: No acute osseous abnormality. Review of the MIP images confirms the above findings. IMPRESSION: 1. Negative for acute pulmonary embolus or aortic dissection. No CT evidence for acute intrathoracic abnormality. 2. Multiple small pulmonary nodules measuring up to 6 mm. Non-contrast chest CT at 3-6 months is recommended. If the nodules  are stable at time of repeat CT, then future CT at 18-24 months (from today's scan) is considered optional for low-risk patients, but is recommended for high-risk patients. This recommendation follows the consensus statement: Guidelines for Management of Incidental Pulmonary Nodules Detected on CT Images: From the Fleischner Society 2017; Radiology 2017; 284:228-243. Electronically Signed   By: Luke Bun M.D.   On: 02/03/2024 19:31   DG Chest Portable 1 View Result Date: 02/03/2024 CLINICAL DATA:  Chest pain EXAM: PORTABLE CHEST 1 VIEW COMPARISON:  Chest x-ray performed July 30, 2023 FINDINGS: Heart mediastinum are similar in appearance. Mild central congestion. Limited evaluation of the retrocardiac region. No pleural effusion or pneumothorax. IMPRESSION: 1. Enlarged heart with mild central congestion. Electronically Signed   By: Maude Naegeli M.D.   On: 02/03/2024 16:57     Procedures   Medications Ordered in the ED  iohexol  (OMNIPAQUE ) 350 MG/ML injection 80 mL (80 mLs Intravenous Contrast Given 02/03/24 1800)  NIH Stroke Scale: 0              Medical Decision Making Amount and/or Complexity of Data Reviewed Labs: ordered. Radiology: ordered.  Risk Prescription drug management.    Previous medical history reviewed : Patient was last seen in ED on January 02, 2024.  Present because ankle pain.    Presents because of some chest pain, cough, body aches.  Patient states on Saturday he started feeling ill.  Start with some chills and bodyaches.  Discontinued on Sunday.  Around this time, started noticing some chest pain that seem to be starting in the back and radiate to the chest.  Some shortness of breath.  No history of DVT or PE.  Takes no birth control.  No recent travel history.  No significant leg pain or swelling of this point time.  No hemoptysis.  Endorse maybe some slight pleuritic chest pain.  No obvious exertional chest pain.  Endorsing episodes of nausea  but no vomiting.  No abdominal pain.  Patient states he is not aware of any kind of sick contacts.  No vision changes, no numbness or tingling anywhere, no vertigo.  No diplopia.   No concerns with CVA.  NIH 0.  Current nerves II through XII intact.  No focal deficits.  Strength and sensation intact.  No further neurowork-up needed this point time.  Not dizzy right now.  No vertical or rotary nystagmus.  In terms of chest pain, did obtain CT of the chest.  Obtained because of pleuritic chest pain.  CT of the chest did not show any clear evidence of pulmonary embolism.  Did show nonspecific nodules that she would follow-up with her PCP for regarding repeat CT scan.  No evidence of pneumonia.   Terms of chest pain, EKG shows no STEMI.  Normal.  Troponin x 2 unremarkable.  High-sensitivity.  No cardiac history.  Low risk from a heart score standpoint.  Can follow-up outpatient.  Recommended patient follow-up with cardiologist outpatient.  Return for any kind of worsening chest pain.        Final diagnoses:  Chest pain, unspecified type  Hypertension, unspecified type    ED Discharge Orders     None          Simon Lavonia SAILOR, MD 02/03/24 2130

## 2024-02-03 NOTE — Discharge Instructions (Addendum)
  The CT nodules that I was discussing with you is as below. Make sure you have your PCP obtain repeat CT scan.   For your blood pressure. Take your readings 2x per day. Write this down and take it to your primary care physician so that they can adjust your medications.   Please establish care with cardiology. Give them a call     2. Multiple small pulmonary nodules measuring up to 6 mm.  Non-contrast chest CT at 3-6 months is recommended. If the nodules  are stable at time of repeat CT, then future CT at 18-24 months  (from today's scan) is considered optional for low-risk patients,  but is recommended for high-risk patients. This recommendation  follows the consensus statement: Guidelines for Management of  Incidental Pulmonary Nodules Detected on CT Images: From the  Fleischner Society 2017; Radiology 2017; 284:228-243.

## 2024-02-04 ENCOUNTER — Ambulatory Visit: Payer: Self-pay

## 2024-02-04 NOTE — Telephone Encounter (Signed)
 FYI Only or Action Required?: Action required by provider: request for appointment and Would like provider to order a BP cuff so she can take readings at home.  Pt needs hospital follow up appt. - none availble  Patient was last seen in primary care on 12/02/2023 by Newlin, Enobong, MD.  Called Nurse Triage reporting Hypertension pt - pt had chest pain yesterday and went to ED  Symptoms began yesterday - HTN is ongoing.  Interventions attempted: Other: ed.  Symptoms are: stable.  Triage Disposition: See Physician Within 24 Hours  Patient/caregiver understands and will follow disposition?: Yes                     Copied from CRM [id number]. Topic: General - Other >> Feb 04, 2024  4:43 PM Zebedee SAUNDERS wrote: Reason for CRM: Pt called stated she needs prescription for blood pressure cuff. Her blood pressure was 186/98 and 210/101 when she went to the hospital. Pt would like a call back at [phone].[facility] [phone] - [address] [place] [address] Phone: [phone] Fax: [phone] Reason for Disposition  Systolic BP  >= 180 OR Diastolic >= 110  Answer Assessment - Initial Assessment Questions 1. BLOOD PRESSURE: What is the blood pressure? Did you take at least two measurements 5 minutes apart?     Pt had high Bp readings at hospital yesterday 2. ONSET: When did you take your blood pressure?     Yesterday at hospital 3. HOW: How did you take your blood pressure? (e.g., automatic home BP monitor, visiting nurse)     At hospital 4. HISTORY: Do you have a history of high blood pressure?     yes 5. MEDICINES: Are you taking any medicines for blood pressure? Have you missed any doses recently?     Yes - no missed doses 6. OTHER SYMPTOMS: Do you have any symptoms? (e.g., blurred vision, chest pain, difficulty breathing, headache, weakness)     Pt had chest pain and went to ED  Protocols used: Blood Pressure - High-A-AH

## 2024-02-05 NOTE — Telephone Encounter (Signed)
 Noted

## 2024-02-05 NOTE — Telephone Encounter (Signed)
 Routing to Triage Nurse as RICK!   Called patient verified name and date of birth. Patient has been scheduled for the next available appointment 02/25/2024 at 9:50 am with Dr.Newlin. Patient acknowledged appointment.

## 2024-02-05 NOTE — Telephone Encounter (Signed)
 Copied from CRM [id number]. Topic: General - Other >> Feb 04, 2024  4:50 PM Zebedee SAUNDERS wrote:  Reason for CRM: Pt needs an ER follow up appt within two weeks for high blood pressure. Please call pt at [phone].

## 2024-02-11 ENCOUNTER — Ambulatory Visit: Admitting: Physician Assistant

## 2024-02-17 ENCOUNTER — Other Ambulatory Visit: Payer: Self-pay

## 2024-02-17 ENCOUNTER — Telehealth: Payer: Self-pay | Admitting: Family Medicine

## 2024-02-17 MED ORDER — PEN NEEDLES 32G X 4 MM MISC: 12 refills | Status: AC

## 2024-02-17 NOTE — Telephone Encounter (Signed)
 Copied from CRM [id number]. Topic: Clinical - Prescription Issue >> Feb 17, 2024 11:57 AM Nicole [name] wrote:  Reason for CRM: patient stated she needs more of the needles for the insulin  glargine (LANTUS ) 100 UNIT/ML Solostar Pen [[id number]] she is not sure of the name but needs a refill

## 2024-02-17 NOTE — Telephone Encounter (Signed)
 Pen needles has been sent to [facility]

## 2024-02-21 ENCOUNTER — Telehealth: Payer: Self-pay | Admitting: Family Medicine

## 2024-02-21 NOTE — Telephone Encounter (Signed)
 Called patient to confirm upcoming appointment 02/25/2024 at 9:50 am. Patient appointment has been successfully confirmed.

## 2024-02-25 ENCOUNTER — Ambulatory Visit: Attending: Family Medicine | Admitting: Family Medicine

## 2024-02-25 ENCOUNTER — Encounter: Payer: Self-pay | Admitting: Family Medicine

## 2024-02-25 VITALS — BP 126/84 | HR 76 | Ht 68.0 in | Wt 309.8 lb

## 2024-02-25 DIAGNOSIS — R0789 Other chest pain: Secondary | ICD-10-CM | POA: Diagnosis not present

## 2024-02-25 DIAGNOSIS — R918 Other nonspecific abnormal finding of lung field: Secondary | ICD-10-CM | POA: Diagnosis not present

## 2024-02-25 DIAGNOSIS — R600 Localized edema: Secondary | ICD-10-CM | POA: Diagnosis not present

## 2024-02-25 DIAGNOSIS — E559 Vitamin D deficiency, unspecified: Secondary | ICD-10-CM | POA: Diagnosis not present

## 2024-02-25 DIAGNOSIS — R5383 Other fatigue: Secondary | ICD-10-CM | POA: Diagnosis not present

## 2024-02-25 NOTE — Patient Instructions (Signed)
 VISIT SUMMARY:  Today, we discussed your intermittent chest pain, fatigue, and other health concerns. We reviewed your recent emergency room visit and the results of your CT scan. We also talked about your ongoing management of hypertension, diabetes, and other conditions.  YOUR PLAN:  -INTERMITTENT CHEST PAIN: Your chest pain could be related to your heart, anxiety, or COPD. We will refer you to a cardiologist for further evaluation, repeat a CT scan of your chest in January 2026, and check a blood test to rule out heart failure.  -PULMONARY NODULES: Pulmonary nodules are small growths in the lungs, often related to COPD. We will repeat a CT scan in January 2026 to monitor them.  -HYPERTENSION WITH LEFT VENTRICULAR HYPERTROPHY: Your high blood pressure has caused your heart to work harder, leading to thickening of the heart muscle. Your blood pressure is currently well-controlled.  -TYPE 2 DIABETES MELLITUS: Your diabetes is being managed with insulin  and medication. Continue with your current treatment plan.  -MORBID OBESITY: You have been diagnosed with severe obesity but have recently lost weight. Keep up the good work with your weight loss efforts.  -DEPENDENT LOWER EXTREMITY EDEMA: Swelling in your legs is likely due to prolonged standing and poor blood flow. Continue using compression stockings, and we may refer you to a vascular surgeon if the swelling persists.  -FATIGUE: Your fatigue could be due to low vitamin D , menopause, or thyroid  issues. We will check your vitamin D  and thyroid  levels and consider supplements if needed.  -MENOPAUSAL STATE: Menopause can cause symptoms like fatigue and joint stiffness. We recommend lifestyle changes such as yoga and walking to help manage these symptoms.  -OSTEOARTHRITIS OF THE KNEE: Osteoarthritis is a condition where the cartilage in your joints wears down, causing stiffness and pain, especially in the morning.  -VITAMIN D  DEFICIENCY,  POSSIBLE: Low vitamin D  levels can contribute to fatigue. We will check your vitamin D  levels and supplement if necessary.  INSTRUCTIONS:  Please follow up with a cardiologist for your chest pain evaluation. Schedule a repeat CT scan of your chest in January 2026. We will also check your vitamin D  and thyroid  levels. Continue using compression stockings for leg swelling and consider lifestyle changes like yoga and walking to manage menopausal symptoms.

## 2024-02-25 NOTE — Progress Notes (Signed)
 Subjective:  Patient ID: Nicole [name], female    DOB: 1976/06/10  Age: 48 y.o. MRN: [id number]  CC: Hospitalization Follow-up (Swelling in feet/Feels drained everyday)     Discussed the use of AI scribe software for clinical note transcription with the patient, who gave verbal consent to proceed.  History of Present Illness Nicole [name] is a 48 year old female with morbid obesity, Type 2 DM, diabetic neuropathy osteoarthritis of the knee, tobacco abuse who presents with intermittent chest pain and fatigue.  She experienced severe chest pain weeks ago, described as a weight on her chest, with difficulty breathing. The pain radiated under her arm, down her arm, and along her jawline. Blood pressure was recorded at 201/180 and 195/101 during an emergency room visit. A CT scan ruled out pulmonary embolism but noted tiny nodules in her lungs.  EKG revealed sinus rhythm, LVH, possible RBBB.  She did have an echo in 2024 which revealed EF of 55 to 60%, LVH.  Her chest pain is intermittent, with sharp pains occurring as recently as the day before the visit.  She experiences swelling in her feet and daily fatigue, with joint stiffness upon waking. Compression socks are used to manage the swelling.  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  Denies intake of high sodium foods.    Past Medical History:  Diagnosis Date   Arthritis    knees   Diabetes mellitus without complication (HCC)    Hyperlipidemia    Hypertension    Ovarian cyst    Pregnancy induced hypertension    Type 2 diabetes mellitus (HCC) 01/08/2018    Past Surgical History:  Procedure Laterality Date   APPENDECTOMY     CESAREAN SECTION     HERNIA REPAIR     LAPAROSCOPIC APPENDECTOMY N/A 03/25/2014   Procedure: APPENDECTOMY LAPAROSCOPIC;  Surgeon: Lynwood MALVA Pina, MD;  Location: [facility];  Service: General;  Laterality: N/A;    Family History  Problem Relation Age of Onset   Hypertension Mother     Cancer Mother        pancreatic cancer   Arthritis Father    Heart disease Maternal Grandmother     Social History   Socioeconomic History   Marital status: Single    Spouse name: Not on file   Number of children: 2   Years of education: Not on file   Highest education level: Not on file  Occupational History   Not on file  Tobacco Use   Smoking status: Former    Current packs/day: 0.00    Average packs/day: 0.5 packs/day for 27.0 years (13.5 ttl pk-yrs)    Types: Cigarettes    Start date: 10/28/1993    Quit date: 10/28/2020    Years since quitting: 3.3   Smokeless tobacco: Never   Tobacco comments:    6 cigs daily  Vaping Use   Vaping status: Never Used  Substance and Sexual Activity   Alcohol use: Not Currently    Comment: occ   Drug use: Yes    Types: Marijuana    Comment: periodically   Sexual activity: Not Currently    Comment: 5 years  Other Topics Concern   Not on file  Social History Narrative   Not on file   Social Drivers of Health   Financial Resource Strain: Low Risk  (11/12/2022)   Overall Financial Resource Strain (CARDIA)    Difficulty of Paying Living Expenses: Not  very hard  Food Insecurity: No Food Insecurity (11/12/2022)   Hunger Vital Sign    Worried About Running Out of Food in the Last Year: Never true    Ran Out of Food in the Last Year: Never true  Transportation Needs: No Transportation Needs (05/24/2020)   PRAPARE - Administrator, Civil Service (Medical): No    Lack of Transportation (Non-Medical): No  Physical Activity: Inactive (11/12/2022)   Exercise Vital Sign    Days of Exercise per Week: 0 days    Minutes of Exercise per Session: 0 min  Stress: No Stress Concern Present (11/12/2022)   [employer] of Occupational Health - Occupational Stress Questionnaire    Feeling of Stress : Not at all  Social Connections: Moderately Integrated (11/12/2022)   Social Connection and Isolation Panel    Frequency of Communication  with Friends and Family: More than three times a week    Frequency of Social Gatherings with Friends and Family: More than three times a week    Attends Religious Services: 1 to 4 times per year    Active Member of [employer] or Organizations: No    Attends Banker Meetings: Never    Marital Status: Living with partner    No Known Allergies  Outpatient Medications Prior to Visit  Medication Sig Dispense Refill   albuterol  (VENTOLIN  HFA) 108 (90 Base) MCG/ACT inhaler Inhale 2 puffs into the lungs every 6 (six) hours as needed for wheezing or shortness of breath. 18 g 0   atorvastatin  (LIPITOR ) 20 MG tablet TAKE 1 TABLET(20 MG) BY MOUTH DAILY 90 tablet 1   insulin  glargine (LANTUS ) 100 UNIT/ML Solostar Pen Inject 30 Units into the skin daily. 15 mL 6   Insulin  Pen Needle (PEN NEEDLES) 32G X 4 MM MISC Use as directed 100 each 12   tirzepatide  (MOUNJARO ) 7.5 MG/0.5ML Pen Inject 7.5 mg into the skin once a week. 2 mL 6   valsartan  (DIOVAN ) 80 MG tablet Take 1 tablet (80 mg total) by mouth daily. 90 tablet 1   oxyCODONE  (ROXICODONE ) 5 MG immediate release tablet Take 1 tablet (5 mg total) by mouth every 4 (four) hours as needed for severe pain (pain score 7-10). (Patient not taking: Reported on 02/25/2024) 20 tablet 0   No facility-administered medications prior to visit.     ROS Review of Systems  Constitutional:  Positive for fatigue. Negative for activity change and appetite change.  HENT:  Negative for sinus pressure and sore throat.   Respiratory:  Negative for chest tightness, shortness of breath and wheezing.   Cardiovascular:  Positive for leg swelling. Negative for chest pain and palpitations.  Gastrointestinal:  Negative for abdominal distention, abdominal pain and constipation.  Genitourinary: Negative.   Musculoskeletal: Negative.   Psychiatric/Behavioral:  Negative for behavioral problems and dysphoric mood.     Objective:  BP 126/84   Pulse 76   Ht 5' 8 (1.727  m)   Wt (!) 309 lb 12.8 oz (140.5 kg)   LMP  (LMP Unknown)   SpO2 100%   BMI 47.10 kg/m      02/25/2024   10:10 AM 02/03/2024    8:30 PM 02/03/2024    8:00 PM  BP/Weight  Systolic BP 126 195 195  Diastolic BP 84 101 92  Wt. (Lbs) 309.8    BMI 47.1 kg/m2        Physical Exam Constitutional:      Appearance: She is well-developed.  Cardiovascular:  Rate and Rhythm: Normal rate.     Heart sounds: Normal heart sounds. No murmur heard. Pulmonary:     Effort: Pulmonary effort is normal.     Breath sounds: Normal breath sounds. No wheezing or rales.  Chest:     Chest wall: No tenderness.  Abdominal:     General: Bowel sounds are normal. There is no distension.     Palpations: Abdomen is soft. There is no mass.     Tenderness: There is no abdominal tenderness.  Musculoskeletal:        General: Normal range of motion.     Right lower leg: Edema present.     Left lower leg: Edema present.  Neurological:     Mental Status: She is alert and oriented to person, place, and time.  Psychiatric:        Mood and Affect: Mood normal.        Latest Ref Rng & Units 02/03/2024    4:35 PM 09/02/2023   10:42 AM 04/22/2023    1:51 PM  CMP  Glucose 70 - 99 mg/dL 83  86  774   BUN 6 - 20 mg/dL 19  10  11    Creatinine 0.44 - 1.00 mg/dL 9.12  9.00  8.97   Sodium 135 - 145 mmol/L 138  142  136   Potassium 3.5 - 5.1 mmol/L 4.2  4.3  3.5   Chloride 98 - 111 mmol/L 105  104  103   CO2 22 - 32 mmol/L 24  23  25    Calcium  8.9 - 10.3 mg/dL 9.5  9.7  8.7   Total Protein 6.5 - 8.1 g/dL 7.7  7.0  6.8   Total Bilirubin 0.0 - 1.2 mg/dL 0.8  0.4  0.6   Alkaline Phos 38 - 126 U/L 121  122  101   AST 15 - 41 U/L 31  23  34   ALT 0 - 44 U/L 34  21  41     Lipid Panel     Component Value Date/Time   CHOL 189 09/02/2023 1042   TRIG 78 09/02/2023 1042   HDL 46 09/02/2023 1042   CHOLHDL 3.8 11/23/2021 1223   CHOLHDL 5.1 02/13/2021 0932   VLDL 26 02/13/2021 0932   LDLCALC 129 (H) 09/02/2023 1042     CBC    Component Value Date/Time   WBC 9.6 02/03/2024 1635   RBC 5.05 02/03/2024 1635   HGB 13.4 02/03/2024 1635   HGB 12.6 02/28/2021 1629   HCT 43.2 02/03/2024 1635   HCT 38.4 02/28/2021 1629   PLT 224 02/03/2024 1635   PLT 265 02/28/2021 1629   MCV 85.5 02/03/2024 1635   MCV 82 02/28/2021 1629   MCH 26.5 02/03/2024 1635   MCHC 31.0 02/03/2024 1635   RDW 14.3 02/03/2024 1635   RDW 13.4 02/28/2021 1629   LYMPHSABS 2.8 02/03/2024 1635   LYMPHSABS 2.8 02/28/2021 1629   MONOABS 0.7 02/03/2024 1635   EOSABS 0.2 02/03/2024 1635   EOSABS 0.2 02/28/2021 1629   BASOSABS 0.0 02/03/2024 1635   BASOSABS 0.1 02/28/2021 1629    Lab Results  Component Value Date   HGBA1C 5.7 12/02/2023    Lab Results  Component Value Date   TSH 0.777 01/09/2023       Assessment & Plan Intermittent chest pain Intermittent chest pain with differential including cardiac causes, anxiety, and COPD-related pulmonary issues. - EKG revealed LVH, RBBB.  - Previous CT chest showed no pulmonary embolism,  but noted pulmonary nodules - Refer to cardiologist for further evaluation. - Repeat CT chest in January 2026 for pulmonary nodules. - Check BNP to rule out heart failure.  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 - Repeat CT chest in January 2026 to ensure stability.   Dependent lower extremity edema Dependent edema likely due to prolonged standing and venous insufficiency. Hesitant to use diuretics due to occupational constraints. - Consider referral to vascular surgeon if symptoms persist. - Continue use of compression stockings.  Fatigue Persistent fatigue with possible contributing factors including low vitamin D , menopause, and potential thyroid  dysfunction. - Check vitamin D  and thyroid  levels. - Consider vitamin D  supplementation if levels are low.  Vitamin D  deficiency -Last vitamin D  level was low Possible vitamin D   deficiency contributing to fatigue. - Check vitamin D  levels and supplement if necessary.    No orders of the defined types were placed in this encounter.   Follow-up: Return for previously scheduled appointment.       Corrina Sabin, MD, FAAFP. [facility] and [facility] [place], [place] [phone]   02/25/2024, 11:33 AM

## 2024-02-26 ENCOUNTER — Telehealth: Payer: Self-pay | Admitting: Family Medicine

## 2024-02-26 ENCOUNTER — Other Ambulatory Visit: Payer: Self-pay | Admitting: Family Medicine

## 2024-02-26 DIAGNOSIS — E1159 Type 2 diabetes mellitus with other circulatory complications: Secondary | ICD-10-CM

## 2024-02-26 LAB — VITAMIN D 25 HYDROXY (VIT D DEFICIENCY, FRACTURES): Vit D, 25-Hydroxy: 14.7 ng/mL — ABNORMAL LOW (ref 30.0–100.0)

## 2024-02-26 LAB — T4, FREE: Free T4: 1.26 ng/dL (ref 0.82–1.77)

## 2024-02-26 LAB — T3: T3, Total: 129 ng/dL (ref 71–180)

## 2024-02-26 LAB — BRAIN NATRIURETIC PEPTIDE: BNP: 14 pg/mL (ref 0.0–100.0)

## 2024-02-26 LAB — TSH: TSH: 0.627 u[IU]/mL (ref 0.450–4.500)

## 2024-02-26 NOTE — Telephone Encounter (Signed)
 Copied from CRM #[id number]. Topic: Clinical - Medication Question >> Feb 26, 2024  4:42 PM Viola F wrote:  Reason for CRM: Patient would like prescription for vitamin D  sent to [facility] pharmacy on file

## 2024-02-26 NOTE — Telephone Encounter (Unsigned)
 Copied from CRM [id number]. Topic: Clinical - Medication Refill >> Feb 26, 2024  4:43 PM Viola F wrote: Medication: valsartan  (DIOVAN ) 80 MG tablet [[id number]]  Has the patient contacted their pharmacy? Yes (Agent: If no, request that the patient contact the pharmacy for the refill. If patient does not wish to contact the pharmacy document the reason why and proceed with request.) (Agent: If yes, when and what did the pharmacy advise?)  This is the patient's preferred pharmacy:   [phone] - [place], [place] - [address] AT [facility] [address] [place] [address] Phone: [phone] Fax: [phone]   Is this the correct pharmacy for this prescription? Yes If no, delete pharmacy and type the correct one.   Has the prescription been filled recently? Yes  Is the patient out of the medication? No, 2 left   Has the patient been seen for an appointment in the last year OR does the patient have an upcoming appointment? Yes  Can we respond through MyChart? Yes  Agent: Please be advised that Rx refills may take up to 3 business days. We ask that you follow-up with your pharmacy.

## 2024-02-27 ENCOUNTER — Encounter: Payer: Self-pay | Admitting: Family Medicine

## 2024-02-27 ENCOUNTER — Ambulatory Visit: Payer: Self-pay | Admitting: Family Medicine

## 2024-02-27 MED ORDER — ERGOCALCIFEROL 1.25 MG (50000 UT) PO CAPS
50000.0000 [IU] | ORAL_CAPSULE | ORAL | 1 refills | Status: DC
Start: 2024-02-27 — End: 2024-06-10

## 2024-02-27 NOTE — Telephone Encounter (Signed)
 Too soon for refill, LRF 12/02/23 for 90 and 1 RF.  Requested Prescriptions  Pending Prescriptions Disp Refills   valsartan  (DIOVAN ) 80 MG tablet 90 tablet 1    Sig: Take 1 tablet (80 mg total) by mouth daily.     Cardiovascular:  Angiotensin Receptor Blockers Passed - 02/27/2024  2:23 PM      Passed - Cr in normal range and within 180 days    Creat  Date Value Ref Range Status  09/06/2016 0.88 0.50 - 1.10 mg/dL Final   Creatinine, Ser  Date Value Ref Range Status  02/03/2024 0.87 0.44 - 1.00 mg/dL Final   Creatinine, Urine  Date Value Ref Range Status  11/18/2007 81.6  Final         Passed - K in normal range and within 180 days    Potassium  Date Value Ref Range Status  02/03/2024 4.2 3.5 - 5.1 mmol/L Final         Passed - Patient is not pregnant      Passed - Last BP in normal range    BP Readings from Last 1 Encounters:  02/25/24 126/84         Passed - Valid encounter within last 6 months    Recent Outpatient Visits           2 days ago Pedal edema   [facility] [place]. [facility] Delbert Clam, MD   2 months ago Type 2 diabetes mellitus with peripheral neuropathy [facility])   [facility] [place]. [facility] Delbert Clam, MD   5 months ago Type 2 diabetes mellitus with peripheral neuropathy [facility])   [facility] [place]. [facility] Delbert Clam, MD   9 months ago Type 2 diabetes mellitus with peripheral neuropathy [facility])   [facility] [place]. [facility] Delbert Clam, MD   1 year ago Annual visit for general adult medical examination with abnormal findings   [facility] Of [place]. [facility] Delbert Clam, MD

## 2024-02-27 NOTE — Telephone Encounter (Signed)
 Patient requesting VIT D pills last VIT D lab was last month.

## 2024-02-28 NOTE — Telephone Encounter (Signed)
 Message sent to patient via mychart

## 2024-03-22 ENCOUNTER — Encounter (HOSPITAL_COMMUNITY): Payer: Self-pay

## 2024-03-22 ENCOUNTER — Ambulatory Visit (HOSPITAL_COMMUNITY)
Admission: EM | Admit: 2024-03-22 | Discharge: 2024-03-22 | Disposition: A | Discharge status: Home / Self Care | Attending: Physician Assistant | Admitting: Physician Assistant

## 2024-03-22 DIAGNOSIS — R0989 Other specified symptoms and signs involving the circulatory and respiratory systems: Secondary | ICD-10-CM

## 2024-03-22 LAB — POC COVID19/FLU A&B COMBO
Covid Antigen, POC: NEGATIVE
Influenza A Antigen, POC: NEGATIVE
Influenza B Antigen, POC: NEGATIVE

## 2024-03-22 LAB — POCT RAPID STREP A (OFFICE): Rapid Strep A Screen: NEGATIVE

## 2024-03-22 NOTE — Discharge Instructions (Signed)

## 2024-03-22 NOTE — ED Provider Notes (Signed)
 [facility]    CSN: [id number] Arrival date & time: 03/22/24  1604      History   Chief Complaint Chief Complaint  Patient presents with   Generalized Body Aches   Shortness of Breath   Cough   Chills    HPI Nicole [name] is a 48 y.o. female.   HPI  Pt is here with her son who is also sick She states she is having chills, nasal congestion, dizziness, chest tightness, body aches She states her symptoms started yesterday  Interventions: Emergen-c and one dose of Mucinex    Past Medical History:  Diagnosis Date   Arthritis    knees   Diabetes mellitus without complication (HCC)    Hyperlipidemia    Hypertension    Ovarian cyst    Pregnancy induced hypertension    Type 2 diabetes mellitus (HCC) 01/08/2018    Patient Active Problem List   Diagnosis Date Noted   Pain in left ankle and joints of left foot 01/21/2024   History of diverticulitis 02/28/2021   Antibiotic-induced yeast infection 02/28/2021   Thrombocytopenia (HCC) 02/28/2021   Hypokalemia 02/28/2021   Sigmoid diverticulitis 02/11/2021   Bacterial vaginitis 12/19/2020   Anxiety and depression 06/11/2018   Vitamin D  deficiency 01/08/2018   Osteoarthritis 01/08/2018   Type 2 diabetes mellitus (HCC) 01/08/2018   Acute appendicitis 03/25/2014   Hypertension 03/25/2014   Morbid obesity (HCC) 03/24/2014   Benign essential HTN 03/24/2014   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   Chronic back pain 03/24/2014   Primary osteoarthritis of both knees 03/24/2014   Tobacco use 03/24/2014    Past Surgical History:  Procedure Laterality Date   APPENDECTOMY     CESAREAN SECTION     HERNIA REPAIR     LAPAROSCOPIC APPENDECTOMY N/A 03/25/2014   Procedure: APPENDECTOMY LAPAROSCOPIC;  Surgeon: Lynwood MALVA Pina, MD;  Location: [facility];  Service: General;  Laterality: N/A;    OB History     Gravida  2   Para  2   Term  1   Preterm  1   AB  0   Living  2      SAB      IAB      Ectopic       Multiple      Live Births  2            Home Medications    Prior to Admission medications   Medication Sig Start Date End Date Taking? Authorizing Provider  albuterol  (VENTOLIN  HFA) 108 (90 Base) MCG/ACT inhaler Inhale 2 puffs into the lungs every 6 (six) hours as needed for wheezing or shortness of breath. 06/21/23   Vonna Sharlet POUR, MD  atorvastatin  (LIPITOR ) 20 MG tablet TAKE 1 TABLET(20 MG) BY MOUTH DAILY 12/02/23   Newlin, Enobong, MD  ergocalciferol  (DRISDOL ) 1.25 MG (50000 UT) capsule Take 1 capsule (50,000 Units total) by mouth once a week. 02/27/24   Newlin, Enobong, MD  insulin  glargine (LANTUS ) 100 UNIT/ML Solostar Pen Inject 30 Units into the skin daily. 12/02/23   Newlin, Enobong, MD  Insulin  Pen Needle (PEN NEEDLES) 32G X 4 MM MISC Use as directed 02/17/24   Newlin, Enobong, MD  oxyCODONE  (ROXICODONE ) 5 MG immediate release tablet Take 1 tablet (5 mg total) by mouth every 4 (four) hours as needed for severe pain (pain score 7-10). Patient not taking: Reported on 02/25/2024 01/02/24   Raford Lenis, MD  tirzepatide  (MOUNJARO ) 7.5 MG/0.5ML Pen Inject 7.5 mg  into the skin once a week. 12/02/23   Newlin, Enobong, MD  valsartan  (DIOVAN ) 80 MG tablet Take 1 tablet (80 mg total) by mouth daily. 12/02/23   Newlin, Enobong, MD  metoprolol  tartrate (LOPRESSOR ) 100 MG tablet Take 1 tablet by mouth once for procedure. 04/05/21   O'Neal, Darryle Ned, MD    Family History Family History  Problem Relation Age of Onset   Hypertension Mother    Cancer Mother        pancreatic cancer   Arthritis Father    Heart disease Maternal Grandmother     Social History Social History   Tobacco Use   Smoking status: Former    Current packs/day: 0.00    Average packs/day: 0.5 packs/day for 27.0 years (13.5 ttl pk-yrs)    Types: Cigarettes    Start date: 10/28/1993    Quit date: 10/28/2020    Years since quitting: 3.4   Smokeless tobacco: Never   Tobacco comments:    6 cigs daily  Vaping Use    Vaping status: Never Used  Substance Use Topics   Alcohol use: Not Currently    Comment: occ   Drug use: Not Currently    Types: Marijuana    Comment: periodically     Allergies   Patient has no known allergies.   Review of Systems Review of Systems  Constitutional:  Positive for fatigue. Negative for chills and fever.  HENT:  Positive for congestion, rhinorrhea and sore throat.   Respiratory:  Positive for cough (mild) and chest tightness. Negative for shortness of breath and wheezing.   Gastrointestinal:  Negative for diarrhea, nausea and vomiting.  Musculoskeletal:  Positive for myalgias.     Physical Exam Triage Vital Signs ED Triage Vitals [03/22/24 1659]  Encounter Vitals Group     BP (!) 149/82     Girls Systolic BP Percentile      Girls Diastolic BP Percentile      Boys Systolic BP Percentile      Boys Diastolic BP Percentile      Pulse Rate 69     Resp 16     Temp 98.2 F (36.8 C)     Temp Source Oral     SpO2      Weight      Height      Head Circumference      Peak Flow      Pain Score 10     Pain Loc      Pain Education      Exclude from Growth Chart    No data found.  Updated Vital Signs BP (!) 149/82 (BP Location: Right Arm)   Pulse 69   Temp 98.2 F (36.8 C) (Oral)   Resp 16   LMP  (LMP Unknown)   Visual Acuity Right Eye Distance:   Left Eye Distance:   Bilateral Distance:    Right Eye Near:   Left Eye Near:    Bilateral Near:     Physical Exam Vitals reviewed.  Constitutional:      General: She is awake.     Appearance: Normal appearance. She is well-developed and well-groomed.  HENT:     Head: Normocephalic and atraumatic.     Right Ear: Hearing, tympanic membrane and ear canal normal.     Left Ear: Hearing, tympanic membrane and ear canal normal.     Mouth/Throat:     Lips: Pink.     Mouth: Mucous membranes are moist.  Pharynx: Oropharynx is clear. Uvula midline. No pharyngeal swelling, oropharyngeal exudate,  posterior oropharyngeal erythema, uvula swelling or postnasal drip.  Cardiovascular:     Rate and Rhythm: Normal rate and regular rhythm.     Pulses: Normal pulses.          Radial pulses are 2+ on the right side and 2+ on the left side.     Heart sounds: Normal heart sounds. No murmur heard.    No friction rub. No gallop.  Pulmonary:     Effort: Pulmonary effort is normal.     Breath sounds: Normal breath sounds. No decreased air movement. No decreased breath sounds, wheezing, rhonchi or rales.  Musculoskeletal:     Cervical back: Normal range of motion and neck supple.  Lymphadenopathy:     Head:     Right side of head: No submental, submandibular or preauricular adenopathy.     Left side of head: No submental, submandibular or preauricular adenopathy.     Cervical:     Right cervical: No superficial cervical adenopathy.    Left cervical: No superficial cervical adenopathy.     Upper Body:     Right upper body: No supraclavicular adenopathy.     Left upper body: No supraclavicular adenopathy.  Neurological:     Mental Status: She is alert.  Psychiatric:        Behavior: Behavior is cooperative.      UC Treatments / Results  Labs (all labs ordered are listed, but only abnormal results are displayed) Labs Reviewed  POCT RAPID STREP A (OFFICE)  POC COVID19/FLU A&B COMBO    EKG   Radiology No results found.  Procedures Procedures (including critical care time)  Medications Ordered in UC Medications - No data to display  Initial Impression / Assessment and Plan / UC Course  I have reviewed the triage vital signs and the nursing notes.  Pertinent labs & imaging results that were available during my care of the patient were reviewed by me and considered in my medical decision making (see chart for details).      Final Clinical Impressions(s) / UC Diagnoses   Final diagnoses:  Symptoms of upper respiratory infection (URI)   Visit with patient indicates  symptoms comprised of slight cough, body aches, chills and chest tightness since yesterday congruent with acute URI that is likely viral in nature  Patient has tested negative for COVID, flu and strep Physical exam and vitals are largely reassuring at this time Due to nature and duration of symptoms recommended treatment regimen is symptomatic relief and follow up if needed Discussed with patient the various viral and bacterial etiologies of current illness and appropriate course of treatment Discussed OTC medication options for multisymptom relief such as Dayquil/Nyquil, Theraflu, AlkaSeltzer, etc. Discussed return precautions if symptoms are not improving or worsen over next 5-7 days.      Discharge Instructions      Based on your described symptoms and the duration of symptoms it is likely that you have a viral upper respiratory infection (often called a cold)  Symptoms can last for 3-10 days with lingering cough and intermittent symptoms potentially  lasting several  weeks after that.  The goal of treatment at this time is to reduce your symptoms and discomfort   You can use the following medications and measures to help yourself feel better until your body fights this off: DayQuil/NyQuil, TheraFlu, Alka-Seltzer  (these medications typically have the same active ingredients in them so you can  choose whichever one you prefer and take consistently during the day and night according to the manufactures instructions.) Flonase A daily antihistamine such as Zyrtec , Claritin, Allegra per your preference.  Please choose 1 and take consistently. Increased fluids.  It is recommended that you take in at least 64 ounces of water per day when you are not sick so it is important to increase this when you are sick and your body may be running fever. Rest Cough drops Chloraseptic throat spray to help with sore throat Nasal saline spray or nasal flushes to help with congestion and runny nose  If  your symptoms seem like they are getting worse over the next 5 to 7 days or not improving you can always follow-up here in urgent care or go to your primary care provider for further management. Go to the ER if you begin to have more serious symptoms such as shortness of breath, trouble breathing, loss of consciousness, swelling around the eyes, high fever, severe lasting headaches, vision changes or neck pain/stiffness.       ED Prescriptions   None    PDMP not reviewed this encounter.   Marylene Rocky BRAVO, PA-C 03/22/24 1842

## 2024-03-22 NOTE — ED Triage Notes (Signed)
 Patient c/o a slight cough, chills, body aches, and SOB since yesterday.  Patient states she has been taking Emergen-C.

## 2024-03-24 ENCOUNTER — Ambulatory Visit (HOSPITAL_COMMUNITY)
Admission: EM | Admit: 2024-03-24 | Discharge: 2024-03-24 | Disposition: A | Discharge status: Home / Self Care | Attending: Emergency Medicine | Admitting: Emergency Medicine

## 2024-03-24 ENCOUNTER — Encounter (HOSPITAL_COMMUNITY): Payer: Self-pay | Admitting: Emergency Medicine

## 2024-03-24 DIAGNOSIS — M25562 Pain in left knee: Secondary | ICD-10-CM

## 2024-03-24 MED ORDER — DEXAMETHASONE SODIUM PHOSPHATE 10 MG/ML IJ SOLN
10.0000 mg | Freq: Once | INTRAMUSCULAR | Status: AC
  Administered 2024-03-24: 10 mg via INTRAMUSCULAR

## 2024-03-24 MED ORDER — DEXAMETHASONE SODIUM PHOSPHATE 10 MG/ML IJ SOLN
INTRAMUSCULAR | Status: AC
  Filled 2024-03-24: qty 1

## 2024-03-24 NOTE — Discharge Instructions (Addendum)
 You are given an injection of Decadron  in clinic which is a steroid to help with the patient related to your pain. You can continue taking naproxen  twice daily as needed for pain. You can also take 500 to 1000 mg of Tylenol  every 6-8 hours as needed for pain. Alternate between ice and heat as needed for pain and rest and elevate periodically throughout the day. Follow-up with [facility] if your pain continues for further evaluation and management. Otherwise follow-up with your primary care provider or return here as needed.

## 2024-03-24 NOTE — ED Triage Notes (Signed)
 Pt reports got up to use the restroom last night and left knee popped. Pt reports when changes position is when pain is worse. Using crutches that had from previous ankle injury.  Took Naproxen  and hydrocodone  that had from her ankle injury that isnt helping at all with knee pain.

## 2024-03-24 NOTE — ED Provider Notes (Signed)
 [facility]    CSN: [id number] Arrival date & time: 03/24/24  1600      History   Chief Complaint Chief Complaint  Patient presents with   Knee Pain    HPI Nicole [name] is a 48 y.o. female.   Patient presents with posterior left knee pain that began last night.  Patient states that she stood up from the couch last night and felt and heard a popping in her left knee and since then has had pain to the back of her knee that is worse with bearing weight or movement.  Patient reports that she has been taking naproxen  and hydrocodone  with minimal relief.  Patient is using a crutch to walk due to pain worsening with weightbearing.  Patient denies any recent falls.  The history is provided by the patient and medical records.  Knee Pain   Past Medical History:  Diagnosis Date   Arthritis    knees   Diabetes mellitus without complication (HCC)    Hyperlipidemia    Hypertension    Ovarian cyst    Pregnancy induced hypertension    Type 2 diabetes mellitus (HCC) 01/08/2018    Patient Active Problem List   Diagnosis Date Noted   Pain in left ankle and joints of left foot 01/21/2024   History of diverticulitis 02/28/2021   Antibiotic-induced yeast infection 02/28/2021   Thrombocytopenia (HCC) 02/28/2021   Hypokalemia 02/28/2021   Sigmoid diverticulitis 02/11/2021   Bacterial vaginitis 12/19/2020   Anxiety and depression 06/11/2018   Vitamin D  deficiency 01/08/2018   Osteoarthritis 01/08/2018   Type 2 diabetes mellitus (HCC) 01/08/2018   Acute appendicitis 03/25/2014   Hypertension 03/25/2014   Morbid obesity (HCC) 03/24/2014   Benign essential HTN 03/24/2014   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   Chronic back pain 03/24/2014   Primary osteoarthritis of both knees 03/24/2014   Tobacco use 03/24/2014    Past Surgical History:  Procedure Laterality Date   APPENDECTOMY     CESAREAN SECTION     HERNIA REPAIR     LAPAROSCOPIC APPENDECTOMY N/A 03/25/2014    Procedure: APPENDECTOMY LAPAROSCOPIC;  Surgeon: Lynwood MALVA Pina, MD;  Location: [facility];  Service: General;  Laterality: N/A;    OB History     Gravida  2   Para  2   Term  1   Preterm  1   AB  0   Living  2      SAB      IAB      Ectopic      Multiple      Live Births  2            Home Medications    Prior to Admission medications   Medication Sig Start Date End Date Taking? Authorizing Provider  albuterol  (VENTOLIN  HFA) 108 (90 Base) MCG/ACT inhaler Inhale 2 puffs into the lungs every 6 (six) hours as needed for wheezing or shortness of breath. 06/21/23   Vonna Sharlet POUR, MD  atorvastatin  (LIPITOR ) 20 MG tablet TAKE 1 TABLET(20 MG) BY MOUTH DAILY 12/02/23   Newlin, Enobong, MD  ergocalciferol  (DRISDOL ) 1.25 MG (50000 UT) capsule Take 1 capsule (50,000 Units total) by mouth once a week. 02/27/24   Newlin, Enobong, MD  insulin  glargine (LANTUS ) 100 UNIT/ML Solostar Pen Inject 30 Units into the skin daily. 12/02/23   Newlin, Enobong, MD  Insulin  Pen Needle (PEN NEEDLES) 32G X 4 MM MISC Use as directed 02/17/24   Newlin, Enobong, MD  oxyCODONE  (ROXICODONE ) 5 MG immediate release tablet Take 1 tablet (5 mg total) by mouth every 4 (four) hours as needed for severe pain (pain score 7-10). Patient not taking: Reported on 02/25/2024 01/02/24   Raford Lenis, MD  tirzepatide  (MOUNJARO ) 7.5 MG/0.5ML Pen Inject 7.5 mg into the skin once a week. 12/02/23   Newlin, Enobong, MD  valsartan  (DIOVAN ) 80 MG tablet Take 1 tablet (80 mg total) by mouth daily. 12/02/23   Newlin, Enobong, MD  metoprolol  tartrate (LOPRESSOR ) 100 MG tablet Take 1 tablet by mouth once for procedure. 04/05/21   O'Neal, Darryle Ned, MD    Family History Family History  Problem Relation Age of Onset   Hypertension Mother    Cancer Mother        pancreatic cancer   Arthritis Father    Heart disease Maternal Grandmother     Social History Social History   Tobacco Use   Smoking status: Former    Current  packs/day: 0.00    Average packs/day: 0.5 packs/day for 27.0 years (13.5 ttl pk-yrs)    Types: Cigarettes    Start date: 10/28/1993    Quit date: 10/28/2020    Years since quitting: 3.4   Smokeless tobacco: Never   Tobacco comments:    6 cigs daily  Vaping Use   Vaping status: Never Used  Substance Use Topics   Alcohol use: Not Currently    Comment: occ   Drug use: Not Currently    Types: Marijuana    Comment: periodically     Allergies   Patient has no known allergies.   Review of Systems Review of Systems  Per HPI  Physical Exam Triage Vital Signs ED Triage Vitals  Encounter Vitals Group     BP 03/24/24 1702 131/82     Girls Systolic BP Percentile --      Girls Diastolic BP Percentile --      Boys Systolic BP Percentile --      Boys Diastolic BP Percentile --      Pulse Rate 03/24/24 1702 71     Resp 03/24/24 1702 18     Temp 03/24/24 1702 97.6 F (36.4 C)     Temp Source 03/24/24 1702 Oral     SpO2 03/24/24 1702 98 %     Weight --      Height --      Head Circumference --      Peak Flow --      Pain Score 03/24/24 1701 10     Pain Loc --      Pain Education --      Exclude from Growth Chart --    No data found.  Updated Vital Signs BP 131/82 (BP Location: Left Arm)   Pulse 71   Temp 97.6 F (36.4 C) (Oral)   Resp 18   LMP  (LMP Unknown)   SpO2 98%   Visual Acuity Right Eye Distance:   Left Eye Distance:   Bilateral Distance:    Right Eye Near:   Left Eye Near:    Bilateral Near:     Physical Exam Vitals and nursing note reviewed.  Constitutional:      General: She is awake. She is not in acute distress.    Appearance: Normal appearance. She is well-developed and well-groomed. She is not ill-appearing.  Musculoskeletal:     Left upper leg: Normal.     Left knee: No swelling, deformity, erythema or bony tenderness. Decreased range of motion. Tenderness  present over the PCL.     Left lower leg: Normal.     Left ankle: Normal.      Comments: Tenderness noted to posterior knee over PCL.  Mildly decreased range of motion secondary to pain.  Skin:    General: Skin is warm and dry.  Neurological:     Mental Status: She is alert.  Psychiatric:        Behavior: Behavior is cooperative.      UC Treatments / Results  Labs (all labs ordered are listed, but only abnormal results are displayed) Labs Reviewed - No data to display  EKG   Radiology No results found.  Procedures Procedures (including critical care time)  Medications Ordered in UC Medications  dexamethasone  (DECADRON ) injection 10 mg (10 mg Intramuscular Given 03/24/24 1739)    Initial Impression / Assessment and Plan / UC Course  I have reviewed the triage vital signs and the nursing notes.  Pertinent labs & imaging results that were available during my care of the patient were reviewed by me and considered in my medical decision making (see chart for details).     Patient is overall well-appearing.  Vitals are stable.  Deferred imaging due to no acute trauma and lack of bony tenderness.  Given IM Decadron  in clinic to assist with pain.  Recommended continue with naproxen  and Tylenol  as needed for pain.  Given orthopedic follow-up.  Discussed follow-up and return precautions. Final Clinical Impressions(s) / UC Diagnoses   Final diagnoses:  Posterior left knee pain     Discharge Instructions      You are given an injection of Decadron  in clinic which is a steroid to help with the patient related to your pain. You can continue taking naproxen  twice daily as needed for pain. You can also take 500 to 1000 mg of Tylenol  every 6-8 hours as needed for pain. Alternate between ice and heat as needed for pain and rest and elevate periodically throughout the day. Follow-up with [facility] if your pain continues for further evaluation and management. Otherwise follow-up with your primary care provider or return here as  needed.     ED Prescriptions   None    PDMP not reviewed this encounter.   Johnie Flaming A, NP 03/24/24 [phone]

## 2024-03-31 ENCOUNTER — Ambulatory Visit (INDEPENDENT_AMBULATORY_CARE_PROVIDER_SITE_OTHER)

## 2024-03-31 ENCOUNTER — Ambulatory Visit (HOSPITAL_COMMUNITY): Admission: EM | Admit: 2024-03-31 | Discharge: 2024-03-31 | Disposition: A | Discharge status: Home / Self Care

## 2024-03-31 ENCOUNTER — Encounter (HOSPITAL_COMMUNITY): Payer: Self-pay

## 2024-03-31 DIAGNOSIS — M85862 Other specified disorders of bone density and structure, left lower leg: Secondary | ICD-10-CM | POA: Diagnosis not present

## 2024-03-31 DIAGNOSIS — M1712 Unilateral primary osteoarthritis, left knee: Secondary | ICD-10-CM | POA: Diagnosis not present

## 2024-03-31 DIAGNOSIS — M25562 Pain in left knee: Secondary | ICD-10-CM | POA: Diagnosis not present

## 2024-03-31 MED ORDER — DICLOFENAC SODIUM 50 MG PO TBEC: 50.0000 mg | DELAYED_RELEASE_TABLET | Freq: Two times a day (BID) | ORAL | 1 refills | Status: DC

## 2024-03-31 NOTE — Discharge Instructions (Addendum)
  1. Primary osteoarthritis of left knee (Primary) - DG Knee Complete 4 Views Left x-ray completed in UC shows no acute fracture or dislocation, joint space narrowing noted, consistent with osteoarthritis of the left knee. - diclofenac  (VOLTAREN ) 50 MG EC tablet; Take 1 tablet (50 mg total) by mouth 2 (two) times daily.  Dispense: 30 tablet; Refill: 1 - AMB referral to orthopedics for follow-up evaluation and management of left knee osteoarthritis. -Continue to monitor symptoms for any change in severity if there is any escalation of current symptoms or development of new symptoms follow-up in ER for further evaluation and management.

## 2024-03-31 NOTE — ED Provider Notes (Signed)
 [facility]  Note:  This document was prepared using Dragon voice recognition software and may include unintentional dictation errors.  MRN: [id number] DOB: 04-27-1976  Subjective:   Nicole [name] is a 48 y.o. female presenting for left knee pain x 1 day.  Patient reports that she was going to sit down yesterday when she heard a pop in her left knee.  Patient reports pain to the posterior of the knee with increased swelling.  Patient reports that his increased pain with standing.  Patient has been taking naproxen  and using ice and heat with no improvement to symptoms.  Patient reports that she has history of osteoarthritis and has previously been seen by orthopedics and received steroid injections.  No current facility-administered medications for this encounter.  Current Outpatient Medications:    diclofenac  (VOLTAREN ) 50 MG EC tablet, Take 1 tablet (50 mg total) by mouth 2 (two) times daily., Disp: 30 tablet, Rfl: 1   albuterol  (VENTOLIN  HFA) 108 (90 Base) MCG/ACT inhaler, Inhale 2 puffs into the lungs every 6 (six) hours as needed for wheezing or shortness of breath., Disp: 18 g, Rfl: 0   atorvastatin  (LIPITOR ) 20 MG tablet, TAKE 1 TABLET(20 MG) BY MOUTH DAILY, Disp: 90 tablet, Rfl: 1   ergocalciferol  (DRISDOL ) 1.25 MG (50000 UT) capsule, Take 1 capsule (50,000 Units total) by mouth once a week., Disp: 12 capsule, Rfl: 1   insulin  glargine (LANTUS ) 100 UNIT/ML Solostar Pen, Inject 30 Units into the skin daily., Disp: 15 mL, Rfl: 6   Insulin  Pen Needle (PEN NEEDLES) 32G X 4 MM MISC, Use as directed, Disp: 100 each, Rfl: 12   tirzepatide  (MOUNJARO ) 7.5 MG/0.5ML Pen, Inject 7.5 mg into the skin once a week., Disp: 2 mL, Rfl: 6   valsartan  (DIOVAN ) 80 MG tablet, Take 1 tablet (80 mg total) by mouth daily., Disp: 90 tablet, Rfl: 1   No Known Allergies  Past Medical History:  Diagnosis Date   Arthritis    knees   Diabetes mellitus without complication (HCC)    Hyperlipidemia     Hypertension    Ovarian cyst    Pregnancy induced hypertension    Type 2 diabetes mellitus (HCC) 01/08/2018     Past Surgical History:  Procedure Laterality Date   APPENDECTOMY     CESAREAN SECTION     HERNIA REPAIR     LAPAROSCOPIC APPENDECTOMY N/A 03/25/2014   Procedure: APPENDECTOMY LAPAROSCOPIC;  Surgeon: Lynwood MALVA Pina, MD;  Location: [facility];  Service: General;  Laterality: N/A;    Family History  Problem Relation Age of Onset   Hypertension Mother    Cancer Mother        pancreatic cancer   Arthritis Father    Heart disease Maternal Grandmother     Social History   Tobacco Use   Smoking status: Every Day    Current packs/day: 0.00    Average packs/day: 0.5 packs/day for 27.0 years (13.5 ttl pk-yrs)    Types: Cigarettes    Start date: 10/28/1993    Last attempt to quit: 10/28/2020    Years since quitting: 3.4   Smokeless tobacco: Never   Tobacco comments:    6 cigs daily  Vaping Use   Vaping status: Never Used  Substance Use Topics   Alcohol use: Not Currently    Comment: occ   Drug use: Not Currently    Types: Marijuana    Comment: periodically    ROS Refer to HPI for ROS details.  Objective:  Vitals: BP 132/85 (BP Location: Left Arm)   Pulse 70   Temp 98 F (36.7 C) (Oral)   Resp 16   LMP  (LMP Unknown)   SpO2 95%   Physical Exam Vitals and nursing note reviewed.  Constitutional:      General: She is not in acute distress.    Appearance: She is well-developed. She is not ill-appearing or toxic-appearing.  HENT:     Head: Normocephalic and atraumatic.  Cardiovascular:     Rate and Rhythm: Normal rate.  Pulmonary:     Effort: Pulmonary effort is normal. No respiratory distress.  Musculoskeletal:     Left knee: Swelling and bony tenderness present. No deformity. Decreased range of motion. Tenderness present. Normal patellar mobility. Normal pulse.  Skin:    General: Skin is warm and dry.  Neurological:     General: No focal deficit  present.     Mental Status: She is alert and oriented to person, place, and time.  Psychiatric:        Mood and Affect: Mood normal.        Behavior: Behavior normal.     Procedures  No results found for this or any previous visit (from the past 24 hours).  No results found.   Assessment and Plan :     Discharge Instructions       1. Primary osteoarthritis of left knee (Primary) - DG Knee Complete 4 Views Left x-ray completed in UC shows no acute fracture or dislocation, joint space narrowing noted, consistent with osteoarthritis of the left knee. - diclofenac  (VOLTAREN ) 50 MG EC tablet; Take 1 tablet (50 mg total) by mouth 2 (two) times daily.  Dispense: 30 tablet; Refill: 1 - AMB referral to orthopedics for follow-up evaluation and management of left knee osteoarthritis. -Continue to monitor symptoms for any change in severity if there is any escalation of current symptoms or development of new symptoms follow-up in ER for further evaluation and management.      Doloros Kwolek B Juanette Urizar   Maghan Jessee, [place], [place] 03/31/24 1622

## 2024-03-31 NOTE — ED Triage Notes (Signed)
 Patient here today with c/o left knee pain X 1 day. Patient went to sit down on the toilet and heard a pop in her knee. Patient started having increased pain when she went to stand up and has been having constant pain since. Patient has tried taking Naproxen  with no relief. Heat and ice do not help.

## 2024-04-01 ENCOUNTER — Ambulatory Visit (HOSPITAL_COMMUNITY): Payer: Self-pay

## 2024-04-01 NOTE — Telephone Encounter (Signed)
 Please advise patient that orthopedic follow-up is still strongly encouraged.  X-ray could not completely rule out fracture of the tibial plateau and CT scan is indicated.  Orthopedics may wish to do further, more advanced imaging to rule this out.

## 2024-04-27 ENCOUNTER — Ambulatory Visit: Admitting: Family Medicine

## 2024-04-27 ENCOUNTER — Encounter: Payer: Self-pay | Admitting: Family Medicine

## 2024-04-27 VITALS — BP 130/84 | HR 68 | Wt 309.0 lb

## 2024-04-27 DIAGNOSIS — M25562 Pain in left knee: Secondary | ICD-10-CM | POA: Diagnosis not present

## 2024-04-27 NOTE — Progress Notes (Signed)
 Name: Nicole [name]   Date of Visit: 04/27/24   Date of last visit with me: Visit date not found   CHIEF COMPLAINT:  Chief Complaint  Patient presents with   Acute Visit    Orthopedic joint issue. Knee, shoulder, wrist, every where that she has pain seems to be a joint.        HPI:  Discussed the use of AI scribe software for clinical note transcription with the patient, who gave verbal consent to proceed.  History of Present Illness   Nicole [name] is a 48 year old female who presents with knee pain and instability following a popping sensation.  She experienced a popping sensation in her knee when getting up from bed to use the bathroom. Initially, the pain was mild but became excruciating by the time she returned to bed, particularly in the back part of her leg. Despite the pop being felt in the knee, the pain is most intense in the back of the leg.  She attempted to manage the pain for a couple of days but sought care at urgent care due to the severity of the pain, where she was prescribed an anti-inflammatory medication. She notes that the knee popped again with the same pain and noise. The pain is exacerbated at night, especially when she sleeps on her back, and she finds some relief using a massaging and heating pad.  The pain affects her daily activities, making it painful to walk, particularly after sitting for a while, and she sometimes feels unstable, as if the knee might give out. Stairs are particularly challenging, requiring her to go one step at a time, a difficulty that existed even before the injury. She has completed the course of anti-inflammatory medication given at urgent care and is not currently taking any pain medication. The pain is most significant at night, requiring her to sleep with a pillow for support.  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 She has purchased a heating pad to help manage  the symptoms.         OBJECTIVE:       02/25/2024   10:11 AM  Depression screen PHQ 2/9  Decreased Interest 1  Down, Depressed, Hopeless 0  PHQ - 2 Score 1  Altered sleeping 1  Tired, decreased energy 1  Change in appetite 0  Feeling bad or failure about yourself  0  Trouble concentrating 0  Moving slowly or fidgety/restless 0  Suicidal thoughts 0  PHQ-9 Score 3  Difficult doing work/chores Somewhat difficult     BP Readings from Last 3 Encounters:  04/27/24 130/84  03/31/24 132/85  03/24/24 131/82    BP 130/84   Pulse 68   Wt (!) 309 lb (140.2 kg)   LMP  (LMP Unknown)   SpO2 96%   BMI 46.98 kg/m    Physical Exam   MUSCULOSKELETAL: Tenderness and arthritis in the knee.      Physical Exam   TTP over the lateral joint line posterior aspect. ROM is full, no catching or locking noted. Mcmurray negative.    ASSESSMENT/PLAN:   Assessment & Plan    Assessment and Plan    Right knee meniscus tear with mechanical symptoms and osteoarthritis Chronic right knee pain with mechanical symptoms likely due to meniscus tear and bone spur. Conservative management preferred. Fear of needles and anesthesia influences treatment decisions. - Recommend daily knee compression sleeve  for swelling and stability. - Advise icing during flare-ups. - Discuss potential steroid injection if no improvement in six weeks, with reassurance on pain management. - Consider MRI if symptoms persist to assess meniscus tear extent. - Discuss surgery if mechanical symptoms continue. - Educate on weight loss to reduce pain and delay knee replacement.  Obesity Obesity contributing to knee pain and potential future knee replacement. Weight loss advised to alleviate pain and improve joint health. - Encourage weight loss to alleviate knee pain and improve joint health.         Oluwatamilore Starnes A. Vita MD [facility]

## 2024-06-01 ENCOUNTER — Encounter: Payer: Self-pay | Admitting: Radiology

## 2024-06-03 ENCOUNTER — Ambulatory Visit: Admitting: Family Medicine

## 2024-06-09 ENCOUNTER — Encounter: Payer: Self-pay | Admitting: Family Medicine

## 2024-06-09 ENCOUNTER — Other Ambulatory Visit (HOSPITAL_COMMUNITY)
Admission: RE | Admit: 2024-06-09 | Discharge: 2024-06-09 | Disposition: A | Discharge status: Home / Self Care | Source: Ambulatory Visit | Attending: Family Medicine | Admitting: Family Medicine

## 2024-06-09 ENCOUNTER — Ambulatory Visit: Attending: Family Medicine | Admitting: Family Medicine

## 2024-06-09 VITALS — BP 147/89 | HR 77 | Temp 98.0°F | Ht 68.0 in | Wt 308.8 lb

## 2024-06-09 DIAGNOSIS — E1159 Type 2 diabetes mellitus with other circulatory complications: Secondary | ICD-10-CM

## 2024-06-09 DIAGNOSIS — Z1211 Encounter for screening for malignant neoplasm of colon: Secondary | ICD-10-CM

## 2024-06-09 DIAGNOSIS — N898 Other specified noninflammatory disorders of vagina: Secondary | ICD-10-CM

## 2024-06-09 DIAGNOSIS — M25572 Pain in left ankle and joints of left foot: Secondary | ICD-10-CM

## 2024-06-09 DIAGNOSIS — M25562 Pain in left knee: Secondary | ICD-10-CM | POA: Diagnosis not present

## 2024-06-09 DIAGNOSIS — G4700 Insomnia, unspecified: Secondary | ICD-10-CM | POA: Diagnosis not present

## 2024-06-09 DIAGNOSIS — E785 Hyperlipidemia, unspecified: Secondary | ICD-10-CM

## 2024-06-09 DIAGNOSIS — I152 Hypertension secondary to endocrine disorders: Secondary | ICD-10-CM

## 2024-06-09 DIAGNOSIS — I10 Essential (primary) hypertension: Secondary | ICD-10-CM | POA: Diagnosis not present

## 2024-06-09 DIAGNOSIS — M1712 Unilateral primary osteoarthritis, left knee: Secondary | ICD-10-CM

## 2024-06-09 DIAGNOSIS — G8929 Other chronic pain: Secondary | ICD-10-CM

## 2024-06-09 DIAGNOSIS — E1169 Type 2 diabetes mellitus with other specified complication: Secondary | ICD-10-CM

## 2024-06-09 DIAGNOSIS — N951 Menopausal and female climacteric states: Secondary | ICD-10-CM

## 2024-06-09 DIAGNOSIS — Z7985 Long-term (current) use of injectable non-insulin antidiabetic drugs: Secondary | ICD-10-CM | POA: Diagnosis not present

## 2024-06-09 DIAGNOSIS — E1142 Type 2 diabetes mellitus with diabetic polyneuropathy: Secondary | ICD-10-CM | POA: Diagnosis not present

## 2024-06-09 DIAGNOSIS — E559 Vitamin D deficiency, unspecified: Secondary | ICD-10-CM | POA: Diagnosis not present

## 2024-06-09 DIAGNOSIS — Z794 Long term (current) use of insulin: Secondary | ICD-10-CM

## 2024-06-09 DIAGNOSIS — M2550 Pain in unspecified joint: Secondary | ICD-10-CM

## 2024-06-09 LAB — POCT GLYCOSYLATED HEMOGLOBIN (HGB A1C): HbA1c, POC (controlled diabetic range): 5.7 % (ref 0.0–7.0)

## 2024-06-09 MED ORDER — ATORVASTATIN CALCIUM 20 MG PO TABS: ORAL_TABLET | ORAL | 1 refills | Status: AC

## 2024-06-09 MED ORDER — GABAPENTIN 300 MG PO CAPS
300.0000 mg | ORAL_CAPSULE | Freq: Every day | ORAL | 3 refills | Status: AC
Start: 2024-06-09 — End: ?

## 2024-06-09 MED ORDER — DICLOFENAC SODIUM 50 MG PO TBEC: 50.0000 mg | DELAYED_RELEASE_TABLET | Freq: Two times a day (BID) | ORAL | 1 refills | Status: DC

## 2024-06-09 MED ORDER — TIRZEPATIDE 7.5 MG/0.5ML ~~LOC~~ SOAJ: 7.5000 mg | SUBCUTANEOUS | 6 refills | Status: AC

## 2024-06-09 MED ORDER — INSULIN GLARGINE 100 UNIT/ML SOLOSTAR PEN: 30.0000 [IU] | PEN_INJECTOR | Freq: Every day | SUBCUTANEOUS | 6 refills | Status: AC

## 2024-06-09 NOTE — Progress Notes (Signed)
 Subjective:  Patient ID: Nicole [name], female    DOB: Mar 21, 1976  Age: 48 y.o. MRN: [id number]  CC: Medical Management of Chronic Issues     Discussed the use of AI scribe software for clinical note transcription with the patient, who gave verbal consent to proceed.  History of Present Illness Nicole [name] is a 48 year old female with morbid obesity, Type 2 DM, diabetic neuropathy osteoarthritis of the knee, nicotine dependence who presents with sleep disturbances and pain management issues.  She experiences significant sleep disturbances, with difficulty maintaining sleep, frequent nocturia every two hours, and hot flashes. Sleep totals approximately four hours per night without daytime napping. Anxiety is present during the day but does not affect nighttime sleep. Hot flashes at night contribute to sleep disturbances, affecting her comfort with fluctuating body temperature.  Widespread joint pain affects her shoulders, knees, elbows, back, and ankles, worsening at night. A pillow between her legs and a massaging heating pad provide some relief. She has a meniscus tear and bone spur in the left knee, which is more troublesome than the knee with arthritis. No medication is currently taken for pain, and hot showers are ineffective.  She does have left ankle pain and would like to be seen by specialist for this.  Pain is improved with range of motion of her left ankle.  Gabapentin  was previously tried, aiding sleep but not pain. Diclofenac  prescribed at urgent care was effective for knee pain. She has diabetes with an A1c of 5.7 and takes vitamin D  supplements. B12 gummies were recently started for energy. Blood pressure is monitored at home, with a recent high reading attributed to coffee consumption.  Blood pressures 2 months ago were normal.   She has vaginal itching but no discharge and would like to have vaginal swab sent off Past Medical History:  Diagnosis Date   Arthritis     knees   Diabetes mellitus without complication (HCC)    Hyperlipidemia    Hypertension    Ovarian cyst    Pregnancy induced hypertension    Type 2 diabetes mellitus (HCC) 01/08/2018    Past Surgical History:  Procedure Laterality Date   APPENDECTOMY     CESAREAN SECTION     HERNIA REPAIR     LAPAROSCOPIC APPENDECTOMY N/A 03/25/2014   Procedure: APPENDECTOMY LAPAROSCOPIC;  Surgeon: Lynwood MALVA Pina, MD;  Location: [facility] OR;  Service: General;  Laterality: N/A;    Family History  Problem Relation Age of Onset   Hypertension Mother    Cancer Mother        pancreatic cancer   Arthritis Father    Heart disease Maternal Grandmother     Social History   Socioeconomic History   Marital status: Single    Spouse name: Not on file   Number of children: 2   Years of education: Not on file   Highest education level: Not on file  Occupational History   Not on file  Tobacco Use   Smoking status: Every Day    Current packs/day: 0.00    Average packs/day: 0.5 packs/day for 27.0 years (13.5 ttl pk-yrs)    Types: Cigarettes    Start date: 10/28/1993    Last attempt to quit: 10/28/2020    Years since quitting: 3.6   Smokeless tobacco: Never   Tobacco comments:    6 cigs daily  Vaping Use   Vaping status: Never Used  Substance and Sexual Activity   Alcohol use: Not Currently  Comment: occ   Drug use: Not Currently    Types: Marijuana    Comment: periodically   Sexual activity: Not Currently    Comment: 5 years  Other Topics Concern   Not on file  Social History Narrative   Not on file   Social Drivers of Health   Financial Resource Strain: Low Risk  (11/12/2022)   Overall Financial Resource Strain (CARDIA)    Difficulty of Paying Living Expenses: Not very hard  Food Insecurity: No Food Insecurity (11/12/2022)   Hunger Vital Sign    Worried About Running Out of Food in the Last Year: Never true    Ran Out of Food in the Last Year: Never true  Transportation Needs: No  Transportation Needs (05/24/2020)   PRAPARE - Administrator, Civil Service (Medical): No    Lack of Transportation (Non-Medical): No  Physical Activity: Inactive (11/12/2022)   Exercise Vital Sign    Days of Exercise per Week: 0 days    Minutes of Exercise per Session: 0 min  Stress: No Stress Concern Present (11/12/2022)   [employer] of Occupational Health - Occupational Stress Questionnaire    Feeling of Stress : Not at all  Social Connections: Moderately Integrated (11/12/2022)   Social Connection and Isolation Panel    Frequency of Communication with Friends and Family: More than three times a week    Frequency of Social Gatherings with Friends and Family: More than three times a week    Attends Religious Services: 1 to 4 times per year    Active Member of [employer] or Organizations: No    Attends Banker Meetings: Never    Marital Status: Living with partner    No Known Allergies  Outpatient Medications Prior to Visit  Medication Sig Dispense Refill   albuterol  (VENTOLIN  HFA) 108 (90 Base) MCG/ACT inhaler Inhale 2 puffs into the lungs every 6 (six) hours as needed for wheezing or shortness of breath. 18 g 0   Insulin  Pen Needle (PEN NEEDLES) 32G X 4 MM MISC Use as directed 100 each 12   valsartan  (DIOVAN ) 80 MG tablet Take 1 tablet (80 mg total) by mouth daily. 90 tablet 1   atorvastatin  (LIPITOR ) 20 MG tablet TAKE 1 TABLET(20 MG) BY MOUTH DAILY 90 tablet 1   diclofenac  (VOLTAREN ) 50 MG EC tablet Take 1 tablet (50 mg total) by mouth 2 (two) times daily. 30 tablet 1   insulin  glargine (LANTUS ) 100 UNIT/ML Solostar Pen Inject 30 Units into the skin daily. 15 mL 6   tirzepatide  (MOUNJARO ) 7.5 MG/0.5ML Pen Inject 7.5 mg into the skin once a week. 2 mL 6   ergocalciferol  (DRISDOL ) 1.25 MG (50000 UT) capsule Take 1 capsule (50,000 Units total) by mouth once a week. (Patient not taking: Reported on 06/09/2024) 12 capsule 1   No facility-administered  medications prior to visit.     ROS Review of Systems  Constitutional:  Negative for activity change and appetite change.  HENT:  Negative for sinus pressure and sore throat.   Respiratory:  Negative for chest tightness, shortness of breath and wheezing.   Cardiovascular:  Negative for chest pain and palpitations.  Gastrointestinal:  Negative for abdominal distention, abdominal pain and constipation.  Genitourinary: Negative.   Musculoskeletal:        See HPI  Psychiatric/Behavioral:  Positive for sleep disturbance. Negative for behavioral problems and dysphoric mood.     Objective:  BP (!) 147/89   Pulse 77  Temp 98 F (36.7 C) (Oral)   Ht 5' 8 (1.727 m)   Wt (!) 308 lb 12.8 oz (140.1 kg)   LMP  (LMP Unknown)   SpO2 100%   BMI 46.95 kg/m      06/09/2024   11:25 AM 06/09/2024   10:42 AM 04/27/2024    4:34 PM  BP/Weight  Systolic BP 147 150 130  Diastolic BP 89 93 84  Wt. (Lbs)  308.8 309  BMI  46.95 kg/m2 46.98 kg/m2      Physical Exam Constitutional:      Appearance: She is well-developed. She is obese.  Cardiovascular:     Rate and Rhythm: Normal rate.     Heart sounds: Normal heart sounds. No murmur heard. Pulmonary:     Effort: Pulmonary effort is normal.     Breath sounds: Normal breath sounds. No wheezing or rales.  Chest:     Chest wall: No tenderness.  Abdominal:     General: Bowel sounds are normal. There is no distension.     Palpations: Abdomen is soft. There is no mass.     Tenderness: There is no abdominal tenderness.  Musculoskeletal:        General: Normal range of motion.     Right lower leg: No edema.     Left lower leg: No edema.     Comments: Reduced flexion and extension of left knee joint Tenderness on ROM of left ankle and left knee joints Right ankle and knee joints are normal  Neurological:     Mental Status: She is alert and oriented to person, place, and time.  Psychiatric:        Mood and Affect: Mood normal.         Latest Ref Rng & Units 02/03/2024    4:35 PM 09/02/2023   10:42 AM 04/22/2023    1:51 PM  CMP  Glucose 70 - 99 mg/dL 83  86  774   BUN 6 - 20 mg/dL 19  10  11    Creatinine 0.44 - 1.00 mg/dL 9.12  9.00  8.97   Sodium 135 - 145 mmol/L 138  142  136   Potassium 3.5 - 5.1 mmol/L 4.2  4.3  3.5   Chloride 98 - 111 mmol/L 105  104  103   CO2 22 - 32 mmol/L 24  23  25    Calcium  8.9 - 10.3 mg/dL 9.5  9.7  8.7   Total Protein 6.5 - 8.1 g/dL 7.7  7.0  6.8   Total Bilirubin 0.0 - 1.2 mg/dL 0.8  0.4  0.6   Alkaline Phos 38 - 126 U/L 121  122  101   AST 15 - 41 U/L 31  23  34   ALT 0 - 44 U/L 34  21  41     Lipid Panel     Component Value Date/Time   CHOL 189 09/02/2023 1042   TRIG 78 09/02/2023 1042   HDL 46 09/02/2023 1042   CHOLHDL 3.8 11/23/2021 1223   CHOLHDL 5.1 02/13/2021 0932   VLDL 26 02/13/2021 0932   LDLCALC 129 (H) 09/02/2023 1042    CBC    Component Value Date/Time   WBC 9.6 02/03/2024 1635   RBC 5.05 02/03/2024 1635   HGB 13.4 02/03/2024 1635   HGB 12.6 02/28/2021 1629   HCT 43.2 02/03/2024 1635   HCT 38.4 02/28/2021 1629   PLT 224 02/03/2024 1635   PLT 265 02/28/2021 1629   MCV  85.5 02/03/2024 1635   MCV 82 02/28/2021 1629   MCH 26.5 02/03/2024 1635   MCHC 31.0 02/03/2024 1635   RDW 14.3 02/03/2024 1635   RDW 13.4 02/28/2021 1629   LYMPHSABS 2.8 02/03/2024 1635   LYMPHSABS 2.8 02/28/2021 1629   MONOABS 0.7 02/03/2024 1635   EOSABS 0.2 02/03/2024 1635   EOSABS 0.2 02/28/2021 1629   BASOSABS 0.0 02/03/2024 1635   BASOSABS 0.1 02/28/2021 1629    Lab Results  Component Value Date   HGBA1C 5.7 06/09/2024    Lab Results  Component Value Date   HGBA1C 5.7 06/09/2024   HGBA1C 5.7 12/02/2023   HGBA1C 6.6 09/02/2023       Assessment & Plan Insomnia/vasomotor menopausal symptoms Chronic insomnia worsened by menopausal symptoms. Gabapentin  effective for sleep, not pain. Diclofenac  effective for pain, not sleep. - Prescribed gabapentin  at night for sleep, hot  flashes, and pain management. - Sleep hygiene   Arthralgia/osteoarthritis of left knee/ankle pain Generalized joint pain with significant left knee pain due to osteoarthritis, meniscal tear, and bone spur. - Prescribed diclofenac  for pain management. - Referred to orthopedic specialist for further evaluation of left ankle - Continue with orthopedic care for management of left knee symptoms - Weight loss will be beneficial   Type 2 Diabetes Mellitus, well controlled Type 2 diabetes well controlled with A1c of 5.7%. - Continue Mounjaro , Lantus  for diabetes management. -Counseled on Diabetic diet, the healthy plate, 849 minutes of moderate intensity exercise/week Blood sugar logs with fasting goals of 80-120 mg/dl, random of less than 819 and in the event of sugars less than 60 mg/dl or greater than 599 mg/dl encouraged to notify the clinic. Advised on the need for annual eye exams, annual foot exams, Pneumonia vaccine.   Hypertension associated with type 2 diabetes mellitus Blood pressure elevated, likely due to increased coffee intake. Previous readings normal. - Continue current antihypertensive medications. - Monitor blood pressure at home. - Scheduled follow-up in 3 months to reassess blood pressure control. -Counseled on blood pressure goal of less than 130/80, low-sodium, DASH diet, medication compliance, 150 minutes of moderate intensity exercise per week. Discussed medication compliance, adverse effects.   Hyperlipidemia associated with type 2 diabetes mellitus Atorvastatin  not started due to recall concerns.  I have addressed her concerns and informed her that recall was for certain batch and patient's affected Dividose patches would be notified.  LDL slightly elevated in February. - Restart atorvastatin  - Will make no regimen changes if LDL is still not at goal due to the fact that she has not yet started atorvastatin . - Low-cholesterol diet  Vitamin D  Deficiency Previous  vitamin D  deficiency noted. - Ordered vitamin D  level with current blood tests. - Will refill vitamin D  supplementation if levels remain low.   Vaginal itching - Cervical vaginal ancillary sent off  General Health Maintenance Screening for colon cancer-colonoscopy referral declined due to fear of sedation. Stool test preferred. - Ordered Cologuard stool test for colorectal cancer screening.      Meds ordered this encounter  Medications   gabapentin  (NEURONTIN ) 300 MG capsule    Sig: Take 1 capsule (300 mg total) by mouth at bedtime.    Dispense:  30 capsule    Refill:  3   diclofenac  (VOLTAREN ) 50 MG EC tablet    Sig: Take 1 tablet (50 mg total) by mouth 2 (two) times daily.    Dispense:  30 tablet    Refill:  1   atorvastatin  (LIPITOR ) 20 MG tablet  Sig: TAKE 1 TABLET(20 MG) BY MOUTH DAILY    Dispense:  90 tablet    Refill:  1   insulin  glargine (LANTUS ) 100 UNIT/ML Solostar Pen    Sig: Inject 30 Units into the skin daily.    Dispense:  15 mL    Refill:  6   tirzepatide  (MOUNJARO ) 7.5 MG/0.5ML Pen    Sig: Inject 7.5 mg into the skin once a week.    Dispense:  2 mL    Refill:  6    Follow-up: Return in about 6 months (around 12/07/2024) for Chronic medical conditions.       Corrina Sabin, MD, FAAFP. [facility] [place], [place] [phone]   06/09/2024, 12:39 PM

## 2024-06-09 NOTE — Patient Instructions (Signed)
 VISIT SUMMARY:  Today, you were seen for sleep disturbances and pain management issues related to insomnia, menopausal symptoms, and joint pain. We discussed your current symptoms, including difficulty maintaining sleep, frequent nighttime urination, hot flashes, and widespread joint pain. We also reviewed your diabetes management, blood pressure, cholesterol levels, and vitamin D  deficiency.  YOUR PLAN:  -INSOMNIA AND MENOPAUSAL SYMPTOMS WITH HOT FLASHES: Your chronic insomnia is worsened by menopausal symptoms, including hot flashes. Gabapentin  has been prescribed to help with sleep, hot flashes, and pain management. Diclofenac  has been refilled to help manage your pain.  -GENERALIZED JOINT PAIN AND LEFT KNEE OSTEOARTHRITIS WITH MENISCAL TEAR AND BONE SPUR: You have generalized joint pain and significant pain in your left knee due to osteoarthritis, a meniscal tear, and a bone spur. You should continue taking diclofenac  for pain management. You have been referred to an orthopedic specialist for further evaluation of your left knee.  -LEFT ANKLE PAIN AND SWELLING: You have persistent pain and swelling in your left ankle. You have been referred to an orthopedic specialist for evaluation of this issue.  -TYPE 2 DIABETES MELLITUS, WELL CONTROLLED: Your type 2 diabetes is well controlled with an A1c of 5.7%. You should continue taking Mounjaro  for diabetes management.  -HYPERTENSION: Your blood pressure was elevated, likely due to increased coffee intake, but previous readings have been normal. You should continue taking your current blood pressure medications and monitor your blood pressure at home. We will reassess your blood pressure control in 3 months.  -HYPERLIPIDEMIA: Your LDL cholesterol was slightly elevated in February, and atorvastatin  was not started due to recall concerns. We will discuss starting atorvastatin  if your cholesterol levels remain elevated.  -VITAMIN D  DEFICIENCY: You have a  history of vitamin D  deficiency. A vitamin D  level has been ordered with your current blood tests, and we will refill your vitamin D  supplementation if your levels remain low.  -FATIGUE: Your fatigue may be related to vitamin D  deficiency and menopausal symptoms. You should continue taking B12 supplements.  -GENERAL HEALTH MAINTENANCE: You declined a colonoscopy due to fear of sedation, so a Cologuard stool test has been ordered for colorectal cancer screening.  INSTRUCTIONS:  Please follow up with the orthopedic specialist for your left knee and left ankle issues. Continue monitoring your blood pressure at home and keep taking your current medications as prescribed. We will reassess your blood pressure control in 6 months. Additionally, complete the Cologuard stool test for colorectal cancer screening as ordered.

## 2024-06-10 ENCOUNTER — Ambulatory Visit: Payer: Self-pay | Admitting: Family Medicine

## 2024-06-10 LAB — CERVICOVAGINAL ANCILLARY ONLY
Bacterial Vaginitis (gardnerella): NEGATIVE
Candida Glabrata: NEGATIVE
Candida Vaginitis: NEGATIVE
Chlamydia: NEGATIVE
Comment: NEGATIVE
Comment: NEGATIVE
Comment: NEGATIVE
Comment: NEGATIVE
Comment: NEGATIVE
Comment: NORMAL
Neisseria Gonorrhea: NEGATIVE
Trichomonas: NEGATIVE

## 2024-06-10 MED ORDER — ERGOCALCIFEROL 1.25 MG (50000 UT) PO CAPS: 50000.0000 [IU] | ORAL_CAPSULE | ORAL | 1 refills | Status: AC

## 2024-06-11 LAB — MICROALBUMIN / CREATININE URINE RATIO
Creatinine, Urine: 109.6 mg/dL
Microalb/Creat Ratio: 24 mg/g{creat} (ref 0–29)
Microalbumin, Urine: 26 ug/mL

## 2024-06-11 LAB — VITAMIN D 25 HYDROXY (VIT D DEFICIENCY, FRACTURES): Vit D, 25-Hydroxy: 26.5 ng/mL — ABNORMAL LOW (ref 30.0–100.0)

## 2024-06-15 ENCOUNTER — Ambulatory Visit: Admitting: Family Medicine

## 2024-06-15 ENCOUNTER — Ambulatory Visit: Payer: Self-pay | Admitting: Family Medicine

## 2024-06-15 ENCOUNTER — Encounter: Payer: Self-pay | Admitting: Family Medicine

## 2024-06-15 VITALS — BP 124/82 | HR 72 | Wt 309.4 lb

## 2024-06-15 DIAGNOSIS — M170 Bilateral primary osteoarthritis of knee: Secondary | ICD-10-CM | POA: Diagnosis not present

## 2024-06-15 MED ORDER — MELOXICAM 15 MG PO TABS: 15.0000 mg | ORAL_TABLET | Freq: Every day | ORAL | 0 refills | Status: AC

## 2024-06-15 NOTE — Progress Notes (Signed)
   Name: Nicole [name]   Date of Visit: 06/15/24   Date of last visit with me: 06/15/2024   CHIEF COMPLAINT:  Chief Complaint  Patient presents with   other    Lt. Knee pain, couple of months, just stood up and it started hurting,        HPI:  Discussed the use of AI scribe software for clinical note transcription with the patient, who gave verbal consent to proceed.  History of Present Illness  Nicole [name] is a 48 year old female who presents with persistent knee pain.  She has chronic knee pain that has not improved and remains stable, though it has not worsened. She is awaiting a new knee sleeve as the previous ones kept rolling. She prefers not to use a hard knee brace.  She manages the knee pain with conservative measures such as ice and a compression sleeve. She has previously used oral medications like diclofenac  but is concerned about the side effects, particularly on her kidneys, and has not taken the medication due to fear of side effects. She experiences moments of increased pain, which she associates with weather changes, but it is not intolerable.  She exercises every morning and is on Mounjaro  for weight loss, which she believes will help alleviate the knee pain. 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  She is now considered prediabetic and is focused on weight loss to manage her condition.    OBJECTIVE:   BP 124/82   Pulse 72   Wt (!) 309 lb 6.4 oz (140.3 kg)   LMP  (LMP Unknown)   BMI 47.04 kg/m    Ortho Exam  Mild tenderness to palpation over the medial lateral joint lines.  Range of motion is full.  No swelling of the knee joint noted. IMAGING:  Previous x-rays done on 03/31/2024, independently reviewed by me.  4 view x-rays of the left knee show moderate narrowing of the medial lateral joint lines with the  medial being more prominent than the lateral.  There is also significant osteophyte formation as well as some tibial plateau sclerosis.  Patellofemoral arthritic change also noted. ASSESSMENT/PLAN:   Assessment & Plan Primary osteoarthritis of both knees    Assessment and Plan Assessment & Plan  Chronic right knee pain with possible meniscal tear Persistent knee pain, manageable but worsens with weather changes. Prefers meloxicam  for its gastrointestinal tolerance and dosing convenience. Hesitant about injections but open to future consideration. - Prescribed meloxicam  for flare-ups, one pill daily as needed. - Advised use of ice for pain management, avoiding heat. - Discussed knee injection as a future option if pain becomes intolerable. - Encouraged weight loss and exercise to alleviate knee pain.  Prediabetes Prediabetes managed with Mounjaro  for weight loss and appetite suppression. Emphasized dietary management to prevent diabetes progression. - Continue Mounjaro  for weight management and appetite suppression. - Encouraged dietary modifications to prevent progression to diabetes.    Ova Meegan A. Vita MD [facility]

## 2024-06-16 ENCOUNTER — Encounter: Payer: Self-pay | Admitting: Physician Assistant

## 2024-06-16 ENCOUNTER — Ambulatory Visit: Admitting: Physician Assistant

## 2024-06-16 ENCOUNTER — Other Ambulatory Visit: Payer: Self-pay

## 2024-06-16 DIAGNOSIS — M25572 Pain in left ankle and joints of left foot: Secondary | ICD-10-CM | POA: Diagnosis not present

## 2024-06-16 NOTE — Progress Notes (Addendum)
 Office Visit Note   Patient: Nicole [name]           Date of Birth: [birth year]           MRN: [id number] Visit Date: 06/16/2024              Requested by: Delbert Clam, MD [address] [place],  [place] [address] PCP: Delbert Clam, MD  No chief complaint on file.     HPI: Patient is a pleasant 48 year old woman who have seen for her left ankle in the past.  Comes in today complaining of left ankle pain and swelling it wakes her up at night she does wear an ASO when she needs it.  This first occurred back in June.  She was placed in a boot also was given a brace.  She progressed well and was given directions and information on a self guided rehabilitation plan.  Assessment & Plan: Visit Diagnoses:  1. Pain in left ankle and joints of left foot     Plan: Patient is a pleasant 48 year old woman comes in today with continued ankle pain over the peroneal tendons as well as the lateral ligaments.  She has failed conservative treatment this has been going on since June 2025 after an injury.  Treatment has included immobilization in a boot.  Immobilization in a brace and exercises self-guided that were printed and talked to her.  She just has not progressed.  Because of this I am recommending an MRI with referral to foot and ankle  Follow-Up Instructions: With foot and ankle  Ortho Exam  Patient is alert, oriented, no adenopathy, well-dressed, normal affect, normal respiratory effort. Examination of her left ankle she has moderate soft tissue swelling focus laterally.  Pulses are intact she has pain with eversion over the peroneal tendons though no subluxation.  She has pain over the lateral ligaments no real pain medially negative squeeze test.  Pulses are intact    Imaging: No results found. No images are attached to the encounter.  Labs: Lab Results  Component Value Date   HGBA1C 5.7 06/09/2024   HGBA1C 5.7 12/02/2023   HGBA1C 6.6 09/02/2023   LABURIC 7.4  (H) 11/24/2007   LABURIC 7.5 (H) 11/23/2007   LABURIC 8.4 (H) 11/21/2007   REPTSTATUS 02/18/2021 FINAL 02/13/2021   CULT  02/13/2021    NO GROWTH 5 DAYS Performed at [facility], [address], [place], [place] [address]    [facility] ESCHERICHIA COLI 02/11/2021     Lab Results  Component Value Date   ALBUMIN 4.0 02/03/2024   ALBUMIN 4.3 09/02/2023   ALBUMIN 3.1 (L) 04/22/2023    Lab Results  Component Value Date   MG 1.9 02/15/2021   MG 2.0 02/12/2021   MG 1.6 (L) 02/11/2021   Lab Results  Component Value Date   VD25OH 26.5 (L) 06/09/2024   VD25OH 14.7 (L) 02/25/2024   VD25OH 13.5 (L) 12/15/2019    No results found for: PREALBUMIN    Latest Ref Rng & Units 02/03/2024    4:35 PM 04/22/2023   11:48 AM 04/11/2023    6:52 PM  CBC EXTENDED  WBC 4.0 - 10.5 K/uL 9.6  8.0  7.6   RBC 3.87 - 5.11 MIL/uL 5.05  5.04  5.16   Hemoglobin 12.0 - 15.0 g/dL 86.5  86.7  86.3   HCT 36.0 - 46.0 % 43.2  42.0  42.8   Platelets 150 - 400 K/uL 224  256  206   NEUT# 1.7 - 7.7 K/uL 5.9  5.5  6.1   Lymph# 0.7 - 4.0 K/uL 2.8  1.9  0.6      There is no height or weight on file to calculate BMI.  Orders:  Orders Placed This Encounter  Procedures   XR Ankle Complete Left   No orders of the defined types were placed in this encounter.    Procedures: No procedures performed  Clinical Data: No additional findings.  ROS:  All other systems negative, except as noted in the HPI. Review of Systems  Objective: Vital Signs: LMP  (LMP Unknown)   Specialty Comments:  No specialty comments available.  PMFS History: Patient Active Problem List   Diagnosis Date Noted   Pain in left ankle and joints of left foot 01/21/2024   History of diverticulitis 02/28/2021   Antibiotic-induced yeast infection 02/28/2021   Thrombocytopenia 02/28/2021   Hypokalemia 02/28/2021   Sigmoid diverticulitis 02/11/2021   Bacterial vaginitis 12/19/2020   Anxiety and depression 06/11/2018    Vitamin D  deficiency 01/08/2018   Osteoarthritis 01/08/2018   Type 2 diabetes mellitus (HCC) 01/08/2018   Acute appendicitis 03/25/2014   Hypertension 03/25/2014   Morbid obesity (HCC) 03/24/2014   Benign essential HTN 03/24/2014   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   Chronic back pain 03/24/2014   Primary osteoarthritis of both knees 03/24/2014   Tobacco use 03/24/2014   Past Medical History:  Diagnosis Date   Arthritis    knees   Diabetes mellitus without complication (HCC)    Hyperlipidemia    Hypertension    Ovarian cyst    Pregnancy induced hypertension    Type 2 diabetes mellitus (HCC) 01/08/2018    Family History  Problem Relation Age of Onset   Hypertension Mother    Cancer Mother        pancreatic cancer   Arthritis Father    Heart disease Maternal Grandmother     Past Surgical History:  Procedure Laterality Date   APPENDECTOMY     CESAREAN SECTION     HERNIA REPAIR     LAPAROSCOPIC APPENDECTOMY N/A 03/25/2014   Procedure: APPENDECTOMY LAPAROSCOPIC;  Surgeon: Lynwood MALVA Pina, MD;  Location: [facility] OR;  Service: General;  Laterality: N/A;   Social History   Occupational History   Not on file  Tobacco Use   Smoking status: Every Day    Current packs/day: 0.00    Average packs/day: 0.5 packs/day for 27.0 years (13.5 ttl pk-yrs)    Types: Cigarettes    Start date: 10/28/1993    Last attempt to quit: 10/28/2020    Years since quitting: 3.6   Smokeless tobacco: Never   Tobacco comments:    6 cigs daily  Vaping Use   Vaping status: Never Used  Substance and Sexual Activity   Alcohol use: Not Currently    Comment: occ   Drug use: Not Currently    Types: Marijuana    Comment: periodically   Sexual activity: Not Currently    Comment: 5 years

## 2024-07-05 ENCOUNTER — Other Ambulatory Visit

## 2024-07-08 ENCOUNTER — Encounter: Admitting: Family

## 2024-07-15 ENCOUNTER — Encounter: Admitting: Family

## 2024-07-20 ENCOUNTER — Inpatient Hospital Stay: Admission: RE | Admit: 2024-07-20 | Discharge: 2024-07-20 | Attending: Physician Assistant

## 2024-07-20 DIAGNOSIS — M25572 Pain in left ankle and joints of left foot: Secondary | ICD-10-CM

## 2024-07-29 ENCOUNTER — Ambulatory Visit: Admitting: Family

## 2024-07-29 DIAGNOSIS — S93492S Sprain of other ligament of left ankle, sequela: Secondary | ICD-10-CM | POA: Diagnosis not present

## 2024-07-29 DIAGNOSIS — S8262XG Displaced fracture of lateral malleolus of left fibula, subsequent encounter for closed fracture with delayed healing: Secondary | ICD-10-CM

## 2024-08-07 ENCOUNTER — Encounter: Payer: Self-pay | Admitting: Family

## 2024-08-07 MED ORDER — OYSTER SHELL CALCIUM/D3 500-5 MG-MCG PO TABS: 1.0000 | ORAL_TABLET | Freq: Every day | ORAL | 0 refills | Status: AC

## 2024-08-07 NOTE — Progress Notes (Signed)
 "  Office Visit Note   Patient: Nicole [name]           Date of Birth: [birth year]           MRN: [id number] Visit Date: 07/29/2024              Requested by: Persons, Ronal Dragon, PA [address] [place],  [place] [address] PCP: Delbert Clam, MD  Chief Complaint  Patient presents with   Left Ankle - Follow-up      HPI: The patient is a 49 year old woman who presents today for MRI review of her left ankle.  She has been following with [facility].  Has a history of injuries to the left ankle in the past her last injury was in June and at the time she was felt to have sprained her ankle and was initially placed in a cam walker boot.  She progressed to the ASO.  Unfortunately she has failed to improve and was asked to resume the cam boot.  She has continued the boot until today.  She has also been working on a self-guided rehab at home  MRI report as follows: IMPRESSION: 1. Redemonstrated transverse fracture of the lateral malleolus below the level of the syndesmosis with mild edema of the nondisplaced distal fracture fragment. No evidence of acute fracture. 2. 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 3. Thickening of the calcaneofibular ligament and anterior tibiofibular ligament is compatible with sequela of prior sprain/injury. 4. Mild tendinosis of the peroneal brevis tendon at the retromalleolar level. 5. Degenerative changes of the naviculocuneiform articulation with prominent dorsal spurring. Subcortical cystic change at the dorsal aspect of the medial cuneiform. 6. Subcutaneous edema at the medial and lateral ankle.  Assessment & Plan: Visit Diagnoses: No diagnosis found.  Plan: Recommended she continue with the cam boot.  She will begin using calcium  plus D.  Discussed this could be gotten over-the-counter.  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  Recommended follow-up with Dr. Duda if she fails to improve  Follow-Up Instructions: No follow-ups on file.   Left Ankle Exam   Tenderness  The patient is experiencing tenderness in the ATF, CF and lateral malleolus.  Swelling: mild  Muscle Strength  The patient has normal left ankle strength.  Other  Erythema: absent Pulse: present      Patient is alert, oriented, no adenopathy, well-dressed, normal affect, normal respiratory effort.     Imaging: No results found. No images are attached to the encounter.  Labs: Lab Results  Component Value Date   HGBA1C 5.7 06/09/2024   HGBA1C 5.7 12/02/2023   HGBA1C 6.6 09/02/2023   LABURIC 7.4 (H) 11/24/2007   LABURIC 7.5 (H) 11/23/2007   LABURIC 8.4 (H) 11/21/2007   REPTSTATUS 02/18/2021 FINAL 02/13/2021   CULT  02/13/2021    NO GROWTH 5 DAYS Performed at [facility], [address], [place], [place] [address]    [facility] ESCHERICHIA COLI 02/11/2021     Lab Results  Component Value Date   ALBUMIN 4.0 02/03/2024   ALBUMIN 4.3 09/02/2023   ALBUMIN 3.1 (L) 04/22/2023    Lab Results  Component Value Date   MG 1.9 02/15/2021   MG 2.0 02/12/2021   MG 1.6 (L) 02/11/2021   Lab Results  Component Value Date   VD25OH 26.5 (L) 06/09/2024   VD25OH 14.7 (L) 02/25/2024   VD25OH 13.5 (L) 12/15/2019    No results found for: PREALBUMIN    Latest Ref Rng & Units 02/03/2024    4:35 PM 04/22/2023   11:48 AM 04/11/2023    6:52 PM  CBC EXTENDED  WBC 4.0 - 10.5 K/uL 9.6  8.0  7.6   RBC 3.87 - 5.11 MIL/uL 5.05  5.04  5.16   Hemoglobin 12.0 - 15.0 g/dL 86.5  86.7  86.3   HCT 36.0 - 46.0 % 43.2  42.0  42.8   Platelets 150 - 400 K/uL 224  256  206   NEUT# 1.7 - 7.7 K/uL 5.9  5.5  6.1   Lymph# 0.7 - 4.0 K/uL 2.8  1.9  0.6      There is no height or weight on  file to calculate BMI.  Orders:  No orders of the defined types were placed in this encounter.  Meds ordered this encounter  Medications   calcium -vitamin D  (OSCAL WITH D) 500-5 MG-MCG tablet    Sig: Take 1 tablet by mouth daily with breakfast.    Dispense:  60 tablet    Refill:  0     Procedures: No procedures performed  Clinical Data: No additional findings.  ROS:  All other systems negative, except as noted in the HPI. Review of Systems  Objective: Vital Signs: LMP  (LMP Unknown)   Specialty Comments:  No specialty comments available.  PMFS History: Patient Active Problem List   Diagnosis Date Noted   Pain in left ankle and joints of left foot 01/21/2024   History of diverticulitis 02/28/2021   Antibiotic-induced yeast infection 02/28/2021   Thrombocytopenia 02/28/2021   Hypokalemia 02/28/2021   Sigmoid diverticulitis 02/11/2021   Bacterial vaginitis 12/19/2020   Anxiety and depression 06/11/2018   Vitamin D  deficiency 01/08/2018   Osteoarthritis 01/08/2018   Type 2 diabetes mellitus (HCC) 01/08/2018   Acute appendicitis 03/25/2014   Hypertension 03/25/2014   Morbid obesity (HCC) 03/24/2014   Benign essential HTN 03/24/2014   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   Chronic back pain 03/24/2014   Primary osteoarthritis of both knees 03/24/2014   Tobacco use 03/24/2014   Past Medical History:  Diagnosis Date   Arthritis    knees   Diabetes mellitus without complication (HCC)    Hyperlipidemia    Hypertension    Ovarian cyst    Pregnancy induced hypertension    Type 2 diabetes mellitus (HCC) 01/08/2018    Family History  Problem Relation Age of Onset   Hypertension Mother    Cancer Mother        pancreatic cancer   Arthritis Father    Heart disease Maternal Grandmother     Past Surgical History:  Procedure Laterality Date   APPENDECTOMY     CESAREAN SECTION     HERNIA REPAIR     LAPAROSCOPIC APPENDECTOMY N/A 03/25/2014   Procedure: APPENDECTOMY  LAPAROSCOPIC;  Surgeon: Lynwood MALVA Pina, MD;  Location: [facility] OR;  Service: General;  Laterality: N/A;   Social History   Occupational History   Not on file  Tobacco Use   Smoking status: Every Day    Current packs/day: 0.00    Average packs/day: 0.5 packs/day for 27.0 years (13.5 ttl pk-yrs)    Types: Cigarettes    Start date: 10/28/1993    Last attempt to quit: 10/28/2020    Years since quitting: 3.7   Smokeless  tobacco: Never   Tobacco comments:    6 cigs daily  Vaping Use   Vaping status: Never Used  Substance and Sexual Activity   Alcohol use: Not Currently    Comment: occ   Drug use: Not Currently    Types: Marijuana    Comment: periodically   Sexual activity: Not Currently    Comment: 5 years       "

## 2024-08-10 ENCOUNTER — Ambulatory Visit: Admitting: Orthopedic Surgery

## 2024-08-10 DIAGNOSIS — M25572 Pain in left ankle and joints of left foot: Secondary | ICD-10-CM | POA: Diagnosis not present

## 2024-08-10 DIAGNOSIS — S93492S Sprain of other ligament of left ankle, sequela: Secondary | ICD-10-CM

## 2024-08-10 DIAGNOSIS — S8262XG Displaced fracture of lateral malleolus of left fibula, subsequent encounter for closed fracture with delayed healing: Secondary | ICD-10-CM

## 2024-08-11 ENCOUNTER — Encounter: Payer: Self-pay | Admitting: Orthopedic Surgery

## 2024-08-11 NOTE — Progress Notes (Signed)
 "  Office Visit Note   Patient: Nicole [name]           Date of Birth: 12-Jul-1976           MRN: [id number] Visit Date: 08/10/2024              Requested by: Delbert Clam, MD [address] [place],  [place] [address] PCP: Delbert Clam, MD  Chief Complaint  Patient presents with   Left Ankle - Follow-up    MRI review      HPI: Discussed the use of AI scribe software for clinical note transcription with the patient, who gave verbal consent to proceed.  History of Present Illness Nicole [name] is a 49 year old female with a left fibular fracture and lateral ankle ligament sprain who presents for evaluation of persistent left ankle pain.  She sustained a left fibular fracture and lateral ankle ligament injury approximately three to four months ago. Since the initial injury, she has experienced constant pain localized to the lateral aspect of the left ankle, with maximal tenderness over the anterior talofibular ligament. She notes persistent swelling, ecchymosis, and discoloration around the foot.  She has attempted to use a lace-up ankle support (ASO) for stability, but is unable to wear it consistently due to difficulty fitting her foot into her shoes. 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 She has not used topical Voltaren  gel and is not currently taking vitamin D3 supplementation. She has avoided oral pain medications due to concern for gastrointestinal side effects.  She denies any new injuries to the ankle since the initial fracture.     Assessment & Plan: Visit Diagnoses: No diagnosis found.  Plan: Assessment and Plan Assessment & Plan Left fibular fracture with lateral ankle ligament sprain Several months post nondisplaced left fibular fracture with anterior talofibular ligament sprain. Fracture healing well; ligament recovery incomplete with persistent symptoms. No surgery needed; symptoms expected to improve. - Continued  use of lace-up ankle support (ASO) for stability. - Topical diclofenac  gel application three times daily for analgesia. - Daily vitamin D3 supplementation (1,000-2,000 IU) for bone healing. - Ankle and toe range of motion exercises to reduce swelling. - Leg elevation when sitting or lying down to decrease edema. - Follow up as needed for persistent or worsening symptoms.      Follow-Up Instructions: No follow-ups on file.   Ortho Exam  Patient is alert, oriented, no adenopathy, well-dressed, normal affect, normal respiratory effort. Physical Exam EXTREMITIES: Good pulse, swelling, ecchymosis, and bruising on left ankle. Anterior drawer test shows 1mm laxity on left ankle, reproducing symptoms. Tenderness to palpation of anterior talofibular ligament on left ankle.  Review of the MRI scan shows edema in the lateral malleolus consistent with a nondisplaced distal fracture.  No evidence of an acute fracture.  There is thickening of the anterior talofibular ligament and calcaneofibular ligament with fluid around the peroneus brevis no osteochondral defect of the tibial talar joint      Imaging: No results found. No images are attached to the encounter.  Labs: Lab Results  Component Value Date   HGBA1C 5.7 06/09/2024   HGBA1C 5.7 12/02/2023   HGBA1C 6.6 09/02/2023   LABURIC 7.4 (H) 11/24/2007   LABURIC 7.5 (H) 11/23/2007   LABURIC 8.4 (H) 11/21/2007   REPTSTATUS 02/18/2021 FINAL 02/13/2021   CULT  02/13/2021    NO GROWTH 5 DAYS Performed at [facility], [address] [place],  [place] [address]    [facility] ESCHERICHIA COLI 02/11/2021     Lab Results  Component Value Date   ALBUMIN 4.0 02/03/2024   ALBUMIN 4.3 09/02/2023   ALBUMIN 3.1 (L) 04/22/2023    Lab Results  Component Value Date   MG 1.9 02/15/2021   MG 2.0 02/12/2021   MG 1.6 (L) 02/11/2021   Lab Results  Component Value Date   VD25OH 26.5 (L) 06/09/2024   VD25OH 14.7 (L) 02/25/2024   VD25OH 13.5  (L) 12/15/2019    No results found for: PREALBUMIN    Latest Ref Rng & Units 02/03/2024    4:35 PM 04/22/2023   11:48 AM 04/11/2023    6:52 PM  CBC EXTENDED  WBC 4.0 - 10.5 K/uL 9.6  8.0  7.6   RBC 3.87 - 5.11 MIL/uL 5.05  5.04  5.16   Hemoglobin 12.0 - 15.0 g/dL 86.5  86.7  86.3   HCT 36.0 - 46.0 % 43.2  42.0  42.8   Platelets 150 - 400 K/uL 224  256  206   NEUT# 1.7 - 7.7 K/uL 5.9  5.5  6.1   Lymph# 0.7 - 4.0 K/uL 2.8  1.9  0.6      There is no height or weight on file to calculate BMI.  Orders:  No orders of the defined types were placed in this encounter.  No orders of the defined types were placed in this encounter.    Procedures: No procedures performed  Clinical Data: No additional findings.  ROS:  All other systems negative, except as noted in the HPI. Review of Systems  Objective: Vital Signs: LMP  (LMP Unknown)   Specialty Comments:  No specialty comments available.  PMFS History: Patient Active Problem List   Diagnosis Date Noted   Pain in left ankle and joints of left foot 01/21/2024   History of diverticulitis 02/28/2021   Antibiotic-induced yeast infection 02/28/2021   Thrombocytopenia 02/28/2021   Hypokalemia 02/28/2021   Sigmoid diverticulitis 02/11/2021   Bacterial vaginitis 12/19/2020   Anxiety and depression 06/11/2018   Vitamin D  deficiency 01/08/2018   Osteoarthritis 01/08/2018   Type 2 diabetes mellitus (HCC) 01/08/2018   Acute appendicitis 03/25/2014   Hypertension 03/25/2014   Morbid obesity (HCC) 03/24/2014   Benign essential HTN 03/24/2014   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   Chronic back pain 03/24/2014   Primary osteoarthritis of both knees 03/24/2014   Tobacco use 03/24/2014   Past Medical History:  Diagnosis Date   Arthritis    knees   Diabetes mellitus without complication (HCC)    Hyperlipidemia    Hypertension    Ovarian cyst    Pregnancy induced hypertension    Type 2 diabetes mellitus (HCC) 01/08/2018    Family  History  Problem Relation Age of Onset   Hypertension Mother    Cancer Mother        pancreatic cancer   Arthritis Father    Heart disease Maternal Grandmother     Past Surgical History:  Procedure Laterality Date   APPENDECTOMY     CESAREAN SECTION     HERNIA REPAIR     LAPAROSCOPIC APPENDECTOMY N/A 03/25/2014   Procedure: APPENDECTOMY LAPAROSCOPIC;  Surgeon: Lynwood MALVA Pina, MD;  Location: [facility] OR;  Service: General;  Laterality: N/A;   Social History   Occupational History   Not on file  Tobacco Use   Smoking status: Every Day    Current packs/day: 0.00    Average packs/day: 0.5 packs/day for  27.0 years (13.5 ttl pk-yrs)    Types: Cigarettes    Start date: 10/28/1993    Last attempt to quit: 10/28/2020    Years since quitting: 3.7   Smokeless tobacco: Never   Tobacco comments:    6 cigs daily  Vaping Use   Vaping status: Never Used  Substance and Sexual Activity   Alcohol use: Not Currently    Comment: occ   Drug use: Not Currently    Types: Marijuana    Comment: periodically   Sexual activity: Not Currently    Comment: 5 years         "

## 2024-08-13 ENCOUNTER — Other Ambulatory Visit: Payer: Self-pay

## 2024-08-14 ENCOUNTER — Telehealth: Payer: Self-pay | Admitting: Family Medicine

## 2024-08-14 NOTE — Telephone Encounter (Signed)
 Copied from CRM [id number]. Topic: Clinical - Medication Question >> Aug 14, 2024  4:45 PM   Nicole [name] wrote:  Reason for CRM: with son at urgent care and he was diagnosed with flu A, asking for a preventative to be called in- [phone]

## 2024-08-17 ENCOUNTER — Other Ambulatory Visit: Payer: Self-pay

## 2024-08-17 ENCOUNTER — Telehealth: Payer: Self-pay

## 2024-08-17 NOTE — Telephone Encounter (Signed)
 Call placed to patient and she states that she is not really having symptoms, she just wants something to prevent her from getting sick.

## 2024-08-17 NOTE — Telephone Encounter (Signed)
 Pharmacy Patient Advocate Encounter  Received notification from [facility] MEDICAID that Prior Authorization for MOUNJARO  has been APPROVED from 08/13/2024 to 08/13/2025   PA #/Case ID/Reference #: [id number]

## 2024-08-18 NOTE — Telephone Encounter (Signed)
 Antiviral medication for prevention is not recommended for her.  She will need to limit exposure to her son and wearing a mask around him might be beneficial.

## 2024-08-18 NOTE — Telephone Encounter (Signed)
 LVM informing patient of PCP response.

## 2024-08-21 ENCOUNTER — Ambulatory Visit (HOSPITAL_COMMUNITY)
Admission: EM | Admit: 2024-08-21 | Discharge: 2024-08-21 | Disposition: A | Discharge status: Home / Self Care | Attending: Internal Medicine | Admitting: Internal Medicine

## 2024-08-21 ENCOUNTER — Encounter (HOSPITAL_COMMUNITY): Payer: Self-pay | Admitting: Emergency Medicine

## 2024-08-21 DIAGNOSIS — Z20828 Contact with and (suspected) exposure to other viral communicable diseases: Secondary | ICD-10-CM

## 2024-08-21 DIAGNOSIS — J111 Influenza due to unidentified influenza virus with other respiratory manifestations: Secondary | ICD-10-CM

## 2024-08-21 LAB — POCT INFLUENZA A/B
Influenza A, POC: NEGATIVE
Influenza B, POC: NEGATIVE

## 2024-08-21 LAB — POC SOFIA SARS ANTIGEN FIA: SARS Coronavirus 2 Ag: NEGATIVE

## 2024-08-21 MED ORDER — OSELTAMIVIR PHOSPHATE 75 MG PO CAPS: 75.0000 mg | ORAL_CAPSULE | Freq: Two times a day (BID) | ORAL | 0 refills | Status: AC

## 2024-08-21 MED ORDER — ONDANSETRON 4 MG PO TBDP: 4.0000 mg | ORAL_TABLET | Freq: Three times a day (TID) | ORAL | 0 refills | Status: AC | PRN

## 2024-08-21 MED ORDER — HYDROCODONE BIT-HOMATROP MBR 5-1.5 MG/5ML PO SOLN: 5.0000 mL | Freq: Three times a day (TID) | ORAL | 0 refills | Status: AC | PRN

## 2024-08-21 NOTE — ED Provider Notes (Signed)
 " [facility]    CSN: [id number] Arrival date & time: 08/21/24  0945      History   Chief Complaint Chief Complaint  Patient presents with   Shortness of Breath   Chills    HPI Nicole [name] is a 49 y.o. female.   49 year old female presents urgent care with complaints of shortness of breath, cough, fatigue and bodyaches.  Her symptoms started late last night.  They have gotten worse today.  She is using throat lozenges but has not taken any medication by mouth.  She denies any fevers, ear pain, vomiting, diarrhea or abdominal pain.  She is having a sore throat.  She does not have an appetite today.   Shortness of Breath Associated symptoms: cough   Associated symptoms: no abdominal pain, no chest pain, no ear pain, no fever, no rash, no sore throat and no vomiting     Past Medical History:  Diagnosis Date   Arthritis    knees   Diabetes mellitus without complication (HCC)    Hyperlipidemia    Hypertension    Ovarian cyst    Pregnancy induced hypertension    Type 2 diabetes mellitus (HCC) 01/08/2018    Patient Active Problem List   Diagnosis Date Noted   Pain in left ankle and joints of left foot 01/21/2024   History of diverticulitis 02/28/2021   Antibiotic-induced yeast infection 02/28/2021   Thrombocytopenia 02/28/2021   Hypokalemia 02/28/2021   Sigmoid diverticulitis 02/11/2021   Bacterial vaginitis 12/19/2020   Anxiety and depression 06/11/2018   Vitamin D  deficiency 01/08/2018   Osteoarthritis 01/08/2018   Type 2 diabetes mellitus (HCC) 01/08/2018   Acute appendicitis 03/25/2014   Hypertension 03/25/2014   Morbid obesity (HCC) 03/24/2014   Benign essential HTN 03/24/2014   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   Chronic back pain 03/24/2014   Primary osteoarthritis of both knees 03/24/2014   Tobacco use 03/24/2014    Past Surgical History:  Procedure Laterality Date   APPENDECTOMY     CESAREAN SECTION     HERNIA REPAIR     LAPAROSCOPIC  APPENDECTOMY N/A 03/25/2014   Procedure: APPENDECTOMY LAPAROSCOPIC;  Surgeon: Lynwood MALVA Pina, MD;  Location: [facility];  Service: General;  Laterality: N/A;    OB History     Gravida  2   Para  2   Term  1   Preterm  1   AB  0   Living  2      SAB      IAB      Ectopic      Multiple      Live Births  2            Home Medications    Prior to Admission medications  Medication Sig Start Date End Date Taking? Authorizing Provider  HYDROcodone  bit-homatropine (HYCODAN) 5-1.5 MG/5ML syrup Take 5 mLs by mouth every 8 (eight) hours as needed for cough. 08/21/24  Yes Suzie Vandam A, PA-C  ondansetron  (ZOFRAN -ODT) 4 MG disintegrating tablet Take 1 tablet (4 mg total) by mouth every 8 (eight) hours as needed for nausea or vomiting. 08/21/24  Yes Jakaiden Fill A, PA-C  oseltamivir (TAMIFLU) 75 MG capsule Take 1 capsule (75 mg total) by mouth every 12 (twelve) hours. 08/21/24  Yes Marchello Rothgeb A, PA-C  albuterol  (VENTOLIN  HFA) 108 (90 Base) MCG/ACT inhaler Inhale 2 puffs into the lungs every 6 (six) hours as needed for wheezing or shortness of breath. 06/21/23   Banister,  Pamela K, MD  atorvastatin  (LIPITOR ) 20 MG tablet TAKE 1 TABLET(20 MG) BY MOUTH DAILY 06/09/24   Newlin, Enobong, MD  calcium -vitamin D  (OSCAL WITH D) 500-5 MG-MCG tablet Take 1 tablet by mouth daily with breakfast. 08/07/24   Valdemar Rocky SAUNDERS, NP  ergocalciferol  (DRISDOL ) 1.25 MG (50000 UT) capsule Take 1 capsule (50,000 Units total) by mouth once a week. 06/10/24   Newlin, Enobong, MD  gabapentin  (NEURONTIN ) 300 MG capsule Take 1 capsule (300 mg total) by mouth at bedtime. Patient not taking: Reported on 08/21/2024 06/09/24   Newlin, Enobong, MD  insulin  glargine (LANTUS ) 100 UNIT/ML Solostar Pen Inject 30 Units into the skin daily. 06/09/24   Newlin, Enobong, MD  Insulin  Pen Needle (PEN NEEDLES) 32G X 4 MM MISC Use as directed 02/17/24   Newlin, Enobong, MD  meloxicam  (MOBIC ) 15 MG tablet Take 1 tablet (15 mg  total) by mouth daily. 06/15/24   Jha, Panav, MD  tirzepatide  (MOUNJARO ) 7.5 MG/0.5ML Pen Inject 7.5 mg into the skin once a week. 06/09/24   Newlin, Enobong, MD  valsartan  (DIOVAN ) 80 MG tablet Take 1 tablet (80 mg total) by mouth daily. 12/02/23   Newlin, Enobong, MD  metoprolol  tartrate (LOPRESSOR ) 100 MG tablet Take 1 tablet by mouth once for procedure. 04/05/21   O'Neal, Darryle Ned, MD    Family History Family History  Problem Relation Age of Onset   Hypertension Mother    Cancer Mother        pancreatic cancer   Arthritis Father    Heart disease Maternal Grandmother     Social History Social History[1]   Allergies   Patient has no known allergies.   Review of Systems Review of Systems  Constitutional:  Positive for appetite change and fatigue. Negative for chills and fever.  HENT:  Negative for ear pain and sore throat.   Eyes:  Negative for pain and visual disturbance.  Respiratory:  Positive for cough and shortness of breath.   Cardiovascular:  Negative for chest pain and palpitations.  Gastrointestinal:  Negative for abdominal pain and vomiting.  Genitourinary:  Negative for dysuria and hematuria.  Musculoskeletal:  Positive for myalgias. Negative for arthralgias and back pain.  Skin:  Negative for color change and rash.  Neurological:  Negative for seizures and syncope.  All other systems reviewed and are negative.    Physical Exam Triage Vital Signs ED Triage Vitals [08/21/24 0956]  Encounter Vitals Group     BP (!) 155/80     Girls Systolic BP Percentile      Girls Diastolic BP Percentile      Boys Systolic BP Percentile      Boys Diastolic BP Percentile      Pulse Rate 74     Resp 20     Temp 97.9 F (36.6 C)     Temp Source Oral     SpO2 98 %     Weight (!) 305 lb (138.3 kg)     Height 5' 8 (1.727 m)     Head Circumference      Peak Flow      Pain Score 8     Pain Loc      Pain Education      Exclude from Growth Chart    No data  found.  Updated Vital Signs BP (!) 155/80   Pulse 74   Temp 97.9 F (36.6 C) (Oral)   Resp 20   Ht 5' 8 (1.727 m)   [place] ROLLEN)  305 lb (138.3 kg)   LMP  (LMP Unknown)   SpO2 98%   BMI 46.38 kg/m   Visual Acuity Right Eye Distance:   Left Eye Distance:   Bilateral Distance:    Right Eye Near:   Left Eye Near:    Bilateral Near:     Physical Exam Vitals and nursing note reviewed.  Constitutional:      General: She is not in acute distress.    Appearance: She is well-developed.  HENT:     Head: Normocephalic and atraumatic.     Right Ear: Tympanic membrane normal.     Left Ear: Tympanic membrane normal.  Eyes:     Conjunctiva/sclera: Conjunctivae normal.  Cardiovascular:     Rate and Rhythm: Normal rate and regular rhythm.     Heart sounds: No murmur heard. Pulmonary:     Effort: Pulmonary effort is normal. No respiratory distress.     Breath sounds: Normal breath sounds. No decreased breath sounds, wheezing or rhonchi.  Abdominal:     Palpations: Abdomen is soft.     Tenderness: There is no abdominal tenderness.  Musculoskeletal:        General: No swelling.     Cervical back: Neck supple.  Skin:    General: Skin is warm and dry.     Capillary Refill: Capillary refill takes less than 2 seconds.  Neurological:     Mental Status: She is alert.  Psychiatric:        Mood and Affect: Mood normal.      UC Treatments / Results  Labs (all labs ordered are listed, but only abnormal results are displayed) Labs Reviewed  POC SOFIA SARS ANTIGEN FIA  POCT INFLUENZA A/B    EKG   Radiology No results found.  Procedures Procedures (including critical care time)  Medications Ordered in UC Medications - No data to display  Initial Impression / Assessment and Plan / UC Course  I have reviewed the triage vital signs and the nursing notes.  Pertinent labs & imaging results that were available during my care of the patient were reviewed by me and considered in my  medical decision making (see chart for details).     Influenza-like illness  Exposure to influenza   Flu A, flu B and COVID testing done today was negative. Known influenza exposure. Symptoms have been less then 12 hours so may be too soon to test positive. We will treat for influenza given the presentation and known exposure. This is a viral infection.  This does not require antibiotic treatment.  We focus treatment on improving the symptoms.  We will treat with the following:  Tamiflu 75 mg twice daily for 5 days. If severe nausea develops then discontinue this medication Zofran  4 mg orally disintegrating tablet every 8 hours as needed for nausea. Hycodan 5 mLs every 8 hours as needed for cough. Use caution as this medication can cause drowsiness.  Alternate tylenol  and ibuprofen  for fever or pain Make sure to stay hydrated by drinking plenty of water. Avoid returning to work until 24 hours without a fever without fever reducing medication Return to urgent care or PCP if symptoms worsen or fail to resolve.    Final Clinical Impressions(s) / UC Diagnoses   Final diagnoses:  Influenza-like illness  Exposure to influenza     Discharge Instructions      Flu A, flu B and COVID testing done today was negative. Known influenza exposure. Symptoms have been less then 12  hours so may be too soon to test positive. We will treat for influenza given the presentation and known exposure. This is a viral infection.  This does not require antibiotic treatment.  We focus treatment on improving the symptoms.  We will treat with the following:  Tamiflu 75 mg twice daily for 5 days. If severe nausea develops then discontinue this medication Zofran  4 mg orally disintegrating tablet every 8 hours as needed for nausea. Hycodan 5 mLs every 8 hours as needed for cough. Use caution as this medication can cause drowsiness.  Alternate tylenol  and ibuprofen  for fever or pain Make sure to stay hydrated by  drinking plenty of water. Avoid returning to work until 24 hours without a fever without fever reducing medication Return to urgent care or PCP if symptoms worsen or fail to resolve.      ED Prescriptions     Medication Sig Dispense Auth. Provider   oseltamivir (TAMIFLU) 75 MG capsule Take 1 capsule (75 mg total) by mouth every 12 (twelve) hours. 10 capsule Hailynn Slovacek A, PA-C   HYDROcodone  bit-homatropine (HYCODAN) 5-1.5 MG/5ML syrup Take 5 mLs by mouth every 8 (eight) hours as needed for cough. 120 mL Aryn Safran A, PA-C   ondansetron  (ZOFRAN -ODT) 4 MG disintegrating tablet Take 1 tablet (4 mg total) by mouth every 8 (eight) hours as needed for nausea or vomiting. 20 tablet Teresa Almarie LABOR, [place]      PDMP not reviewed this encounter.    [1]  Social History Tobacco Use   Smoking status: Every Day    Current packs/day: 0.00    Average packs/day: 0.5 packs/day for 27.0 years (13.5 ttl pk-yrs)    Types: Cigarettes    Start date: 10/28/1993    Last attempt to quit: 10/28/2020    Years since quitting: 3.8   Smokeless tobacco: Never   Tobacco comments:    6 cigs daily  Vaping Use   Vaping status: Never Used  Substance Use Topics   Alcohol use: Not Currently    Comment: occ   Drug use: Not Currently    Types: Marijuana    Comment: periodically     Teresa Almarie LABOR, PA-C 08/21/24 1030  "

## 2024-08-21 NOTE — ED Triage Notes (Signed)
 Pt st's she started having chills last pm  Today c/o generalized body aches, fatigue and shortness of breath

## 2024-08-21 NOTE — Discharge Instructions (Addendum)
 Flu A, flu B and COVID testing done today was negative. Known influenza exposure. Symptoms have been less then 12 hours so may be too soon to test positive. We will treat for influenza given the presentation and known exposure. This is a viral infection.  This does not require antibiotic treatment.  We focus treatment on improving the symptoms.  We will treat with the following:  Tamiflu 75 mg twice daily for 5 days. If severe nausea develops then discontinue this medication Zofran  4 mg orally disintegrating tablet every 8 hours as needed for nausea. Hycodan 5 mLs every 8 hours as needed for cough. Use caution as this medication can cause drowsiness.  Alternate tylenol  and ibuprofen  for fever or pain Make sure to stay hydrated by drinking plenty of water. Avoid returning to work until 24 hours without a fever without fever reducing medication Return to urgent care or PCP if symptoms worsen or fail to resolve.

## 2024-12-07 ENCOUNTER — Ambulatory Visit: Admitting: Family Medicine
